# Patient Record
Sex: Male | Born: 1950 | Race: White | Hispanic: No | Marital: Single | Smoking: Never smoker
Health system: Southern US, Community
[De-identification: ages and names within clinical notes are randomized; demographics above are authoritative.]

## PROBLEM LIST (undated history)

## (undated) DIAGNOSIS — E119 Type 2 diabetes mellitus without complications: Secondary | ICD-10-CM

## (undated) DIAGNOSIS — I10 Essential (primary) hypertension: Secondary | ICD-10-CM

## (undated) DIAGNOSIS — Z9289 Personal history of other medical treatment: Secondary | ICD-10-CM

## (undated) DIAGNOSIS — J45909 Unspecified asthma, uncomplicated: Secondary | ICD-10-CM

## (undated) DIAGNOSIS — G629 Polyneuropathy, unspecified: Secondary | ICD-10-CM

## (undated) DIAGNOSIS — H359 Unspecified retinal disorder: Secondary | ICD-10-CM

## (undated) DIAGNOSIS — H538 Other visual disturbances: Secondary | ICD-10-CM

## (undated) DIAGNOSIS — K8020 Calculus of gallbladder without cholecystitis without obstruction: Secondary | ICD-10-CM

## (undated) DIAGNOSIS — K259 Gastric ulcer, unspecified as acute or chronic, without hemorrhage or perforation: Secondary | ICD-10-CM

## (undated) DIAGNOSIS — M726 Necrotizing fasciitis: Secondary | ICD-10-CM

## (undated) DIAGNOSIS — L309 Dermatitis, unspecified: Secondary | ICD-10-CM

## (undated) DIAGNOSIS — R21 Rash and other nonspecific skin eruption: Secondary | ICD-10-CM

## (undated) DIAGNOSIS — D649 Anemia, unspecified: Secondary | ICD-10-CM

## (undated) DIAGNOSIS — R252 Cramp and spasm: Secondary | ICD-10-CM

## (undated) HISTORY — DX: Necrotizing fasciitis: M72.6

## (undated) HISTORY — DX: Dermatitis, unspecified: L30.9

## (undated) HISTORY — DX: Cramp and spasm: R25.2

## (undated) HISTORY — DX: Calculus of gallbladder without cholecystitis without obstruction: K80.20

## (undated) HISTORY — PX: ABOVE KNEE LEG AMPUTATION: SUR20

## (undated) HISTORY — DX: Polyneuropathy, unspecified: G62.9

## (undated) HISTORY — DX: Other visual disturbances: H53.8

## (undated) HISTORY — PX: TOE AMPUTATION: SHX809

## (undated) HISTORY — PX: CATARACT EXTRACTION W/ INTRAOCULAR LENS  IMPLANT, BILATERAL: SHX1307

## (undated) HISTORY — DX: Unspecified retinal disorder: H35.9

## (undated) HISTORY — DX: Rash and other nonspecific skin eruption: R21

---

## 2001-09-18 HISTORY — PX: FOOT FRACTURE SURGERY: SHX645

## 2008-04-06 ENCOUNTER — Encounter: Admission: RE | Admit: 2008-04-06 | Discharge: 2008-06-10 | Payer: Self-pay | Admitting: Endocrinology

## 2008-04-21 ENCOUNTER — Encounter
Admission: RE | Admit: 2008-04-21 | Discharge: 2008-04-22 | Payer: Self-pay | Admitting: Physical Medicine & Rehabilitation

## 2008-04-22 ENCOUNTER — Ambulatory Visit: Payer: Self-pay | Admitting: Physical Medicine & Rehabilitation

## 2009-06-29 ENCOUNTER — Ambulatory Visit: Payer: Self-pay | Admitting: Vascular Surgery

## 2009-09-20 ENCOUNTER — Ambulatory Visit (HOSPITAL_COMMUNITY): Admission: RE | Admit: 2009-09-20 | Discharge: 2009-09-20 | Payer: Self-pay | Admitting: Endocrinology

## 2010-04-06 ENCOUNTER — Encounter: Admission: RE | Admit: 2010-04-06 | Discharge: 2010-04-06 | Payer: Self-pay | Admitting: Orthopedic Surgery

## 2011-01-31 NOTE — Consult Note (Signed)
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 [name] for possible vascular insufficiency of the left  leg.  This patient has a history of an urgent right above knee  amputation in 2006 in [place], [place], when he apparently  became septic and extremely ill and had above knee amputation and a  prolonged hospitalization.  He subsequently moved to [facility] and over  the past few years has been having intermittent pain behind his left  ankle bone.  This has not been associated with any infection,  ulceration, gangrene, drainage or other problems.  He has had a history  of ruptured left flexor tendon in the ankle area which required surgery  as well as a left first toe amputation in the past.  At that point he  had a fracture of his left fifth metatarsal bone.  He has not had this  evaluated by an orthopedic surgeon in [place].  He does not ambulate  much and has not had a successful ability to obtain a right leg  prosthesis.   PAST MEDICAL HISTORY:  1. Insulin diabetes mellitus since age 59.  2. Hypertension.  3. Hyperlipidemia.  4. COPD with asthma.  5. Negative for coronary artery disease or stroke.   FAMILY HISTORY:  Positive for coronary artery disease in his mother and  father, diabetes in his mother.  Negative for stroke.   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  He does not use tobacco or alcohol.   REVIEW OF SYSTEMS:  Negative for weight loss, anorexia, chest pain,  dyspnea on exertion, PND, orthopnea, bronchitis.  Does have intermittent  wheezing from asthma.  No GI or GU symptoms.  Denies any neurologic,  psychiatric or hematologic problems.  Please see health history exam.   ALLERGIES:  None known.   MEDICATIONS:  Please see health history form.   PHYSICAL EXAM:  Vital signs:  Blood pressure is 141/91, heart rate is  89, respirations 14.  General:  He is a middle-aged male patient in no  apparent distress, alert and oriented x3.  Neck:  Supple, 3+ carotid  pulses palpable.  No bruits are audible.  Neurological:  Normal.  No  palpable adenopathy in the neck.  Chest:  Clear to auscultation.  Cardiovascular:  Reveals regular rhythm.  No murmurs.  Abdomen:  Soft,  nontender with no masses.  He has 3+ femoral pulse on the right with a  well-healed above knee amputation.  Left leg has a 3+ femoral, 2+  popliteal pulse with a 1+ posterior tibial pulse.  The left foot is well  perfused with no evidence of infection, ulceration or gangrene.  The  tenderness that he experiences is posterior to the left lateral  malleolus but no abnormalities are noted in this area.  No distal edema  is noted.   Lower extremity arterial Dopplers reveal biphasic and triphasic flow in  the left foot with noncompressible vessels consistent with medial  calcification from diabetes.   He does not have any evidence of severe vascular insufficiency of the  left leg  to account for these symptoms which sound more mechanical and  orthopedic in nature.  Possibly an orthopedic consultation would be in  order since he did have a fracture of his left fifth metatarsal bone  with surgery performed in this area.  No further vascular evaluation is  necessary.   Quita Skye Hart [place], M.D.  Electronically Signed   JDL/MEDQ  D:  06/29/2009  T:  06/30/2009  Job:  [id number]   cc:   Jeannett Senior A. [name], M.D.

## 2011-01-31 NOTE — Group Therapy Note (Signed)
[name] is referred to this office by [name] at [facility].  The  patient is a 60 year old large adult male who underwent a right  transfemoral amputation in May 2008 secondary to severe infection  related to diabetes mellitus.  He reports that after this initial  surgery he had infection and was not fitted for an eventual prosthesis  until, possibly, early 2009, that continues to have fitting problems and  he used the prosthesis intermittently for only approximately 4-6 months.  He really stopped using that prosthesis completely in 2009 secondary to  fit problems.   The patient has been using a wheelchair for all mobility, although he  does do stand-pivot transfers.  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  He is interested in getting a repeat definitive prosthesis at  this point.  He has been seen by [name], and they are evaluating his  old prosthesis.  He has also been started on outpatient therapy with  [name] for strengthening, especially in ankle  dorsiflexion on the left side.  He does have toe amputation from the  left great toe and, of course, the right transfemoral amputation in his  right lower extremity.   The patient reports that he had been using walker for only short-  distance mobility but for the most part uses the wheelchair for all  mobility.  In terms of his other health problems, he has diabetes but  has blood sugars which range from 80 to 110.  He reports that hemoglobin  A1c was recently measured at 6.4 by his primary care physician Dr.  [name].  He reports a weight of 250 pounds and reports that he may some  mild kidney problems at present.   PAST MEDICAL HISTORY:  1. History of diabetic neuropathy.  2. Insulin-dependent diabetes mellitus.  3. Hypertension.  4. Dyslipidemia.  5. Gastroesophageal reflux disease.   MEDICATIONS:  1. NovoLog  sliding-scale insulin b.i.d. p.r.n.  2. Lantus 40 mg nightly.  3. Fortamet 500 mg b.i.d.  4. Crestor 20 mg daily.  5. Lisinopril 5 mg daily.  6. Multivitamin daily.  7. Aspirin 81 mg daily.  8. B1, 250 mg daily.  9. Tums p.r.n.  10.Nexium 40 mg daily.   REVIEW OF SYSTEMS:  Noncontributory.   ALLERGIES:  No known drug allergies.   PHYSICAL EXAMINATION:  GENERAL:  Well-appearing, large adult male.  Height 6 feet, 250 pounds.  Seated in a manual wheelchair.  VITAL SIGNS:  Blood pressure is 152/73 with pulse of 81, respiratory  rate 18, and O2 saturation 98% on room air.  He has 5/5 strength  throughout the bilateral upper extremities and residual bilateral lower  extremities.  Sensation is intact to light touch throughout the  bilateral upper and lower extremities.   IMPRESSION:  1. 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  2. Poor fitting problems related to prior prosthesis.  3. Insulin-dependent diabetes mellitus.  4. Dyslipidemia.  5. Hypertension.  6. Prior left great toe amputation.   At the present time, the patient will continue to see [name] along  with Zella Ball in the [facility].  The patient is interested in  obtaining a prosthesis, but he is interested in looking into the cost  also.  [name] is also looking into the fact that the patient also weighs  250 pounds and the pneumatic knee may or may not  suffice to carry that  weight.  There is a possibility that he may need to switch to a  hydraulic knee but, hopefully, they can still use the foot.  He  definitely needs a new socket for his right above-knee amputation site.  He has changed in volume in that residual limb and also feels that he  has increased muscle tone.  The wound itself is completely healed, but  he does have less tissue medially than he does laterally.  There is no  pain on palpation, and he denies pain throughout the entire right lower  extremity.   We will plan on seeing the patient  in followup on an as-needed basis.  He will be continuing with the prosthetist and the  physical therapist,  and they will hopefully be able to get him a prosthesis that will work  for him and be affordable.  We will plan on seeing the patient in  followup on an as-needed basis.           ______________________________  Ellwood Dense, M.D.     DC/MedQ  D:  04/22/2008 10:23:07  T:  04/23/2008 03:42:49  Job #:  [id number]

## 2011-09-19 HISTORY — PX: EYE SURGERY: SHX253

## 2011-10-17 ENCOUNTER — Ambulatory Visit: Payer: BC Managed Care – PPO | Attending: Endocrinology | Admitting: Physical Therapy

## 2011-10-17 DIAGNOSIS — Z4789 Encounter for other orthopedic aftercare: Secondary | ICD-10-CM | POA: Insufficient documentation

## 2011-10-17 DIAGNOSIS — R269 Unspecified abnormalities of gait and mobility: Secondary | ICD-10-CM | POA: Insufficient documentation

## 2011-10-17 DIAGNOSIS — R5381 Other malaise: Secondary | ICD-10-CM | POA: Insufficient documentation

## 2011-10-24 ENCOUNTER — Ambulatory Visit: Payer: BC Managed Care – PPO | Admitting: Physical Therapy

## 2011-10-24 ENCOUNTER — Encounter: Payer: BC Managed Care – PPO | Admitting: Physical Therapy

## 2011-10-24 ENCOUNTER — Ambulatory Visit: Payer: BC Managed Care – PPO | Attending: Endocrinology | Admitting: Physical Therapy

## 2011-10-24 DIAGNOSIS — R269 Unspecified abnormalities of gait and mobility: Secondary | ICD-10-CM | POA: Insufficient documentation

## 2011-10-24 DIAGNOSIS — R5381 Other malaise: Secondary | ICD-10-CM | POA: Insufficient documentation

## 2011-10-24 DIAGNOSIS — Z4789 Encounter for other orthopedic aftercare: Secondary | ICD-10-CM | POA: Insufficient documentation

## 2011-10-30 ENCOUNTER — Encounter: Payer: Self-pay | Admitting: Physical Therapy

## 2011-10-31 ENCOUNTER — Encounter: Payer: Self-pay | Admitting: Physical Therapy

## 2011-11-07 ENCOUNTER — Encounter: Payer: Self-pay | Admitting: Physical Therapy

## 2011-11-09 ENCOUNTER — Encounter: Payer: BC Managed Care – PPO | Admitting: Physical Therapy

## 2011-11-09 ENCOUNTER — Encounter: Payer: Self-pay | Admitting: Physical Therapy

## 2011-11-14 ENCOUNTER — Encounter: Payer: Self-pay | Admitting: Physical Therapy

## 2011-11-16 ENCOUNTER — Encounter: Payer: Self-pay | Admitting: Physical Therapy

## 2011-11-21 ENCOUNTER — Encounter: Payer: Self-pay | Admitting: Physical Therapy

## 2011-11-23 ENCOUNTER — Encounter: Payer: Self-pay | Admitting: Physical Therapy

## 2011-12-11 ENCOUNTER — Other Ambulatory Visit: Payer: Self-pay | Admitting: Endocrinology

## 2011-12-11 DIAGNOSIS — R1011 Right upper quadrant pain: Secondary | ICD-10-CM

## 2011-12-15 ENCOUNTER — Ambulatory Visit
Admission: RE | Admit: 2011-12-15 | Discharge: 2011-12-15 | Disposition: A | Discharge status: Home / Self Care | Payer: BC Managed Care – PPO | Source: Ambulatory Visit | Attending: Endocrinology | Admitting: Endocrinology

## 2011-12-15 DIAGNOSIS — R1011 Right upper quadrant pain: Secondary | ICD-10-CM

## 2011-12-18 ENCOUNTER — Other Ambulatory Visit (HOSPITAL_COMMUNITY): Payer: Self-pay | Admitting: Endocrinology

## 2011-12-19 ENCOUNTER — Ambulatory Visit (HOSPITAL_COMMUNITY): Payer: BC Managed Care – PPO

## 2011-12-21 ENCOUNTER — Ambulatory Visit (HOSPITAL_COMMUNITY): Payer: BC Managed Care – PPO | Attending: Cardiovascular Disease

## 2011-12-21 ENCOUNTER — Other Ambulatory Visit: Payer: Self-pay

## 2011-12-21 DIAGNOSIS — J45909 Unspecified asthma, uncomplicated: Secondary | ICD-10-CM | POA: Insufficient documentation

## 2011-12-21 DIAGNOSIS — R072 Precordial pain: Secondary | ICD-10-CM | POA: Insufficient documentation

## 2011-12-21 DIAGNOSIS — E119 Type 2 diabetes mellitus without complications: Secondary | ICD-10-CM | POA: Insufficient documentation

## 2011-12-22 ENCOUNTER — Encounter (HOSPITAL_COMMUNITY): Payer: Self-pay | Admitting: Endocrinology

## 2012-02-23 ENCOUNTER — Encounter (INDEPENDENT_AMBULATORY_CARE_PROVIDER_SITE_OTHER): Payer: Self-pay | Admitting: General Surgery

## 2012-02-26 ENCOUNTER — Ambulatory Visit (INDEPENDENT_AMBULATORY_CARE_PROVIDER_SITE_OTHER): Payer: Self-pay | Admitting: General Surgery

## 2012-03-20 ENCOUNTER — Telehealth (INDEPENDENT_AMBULATORY_CARE_PROVIDER_SITE_OTHER): Payer: Self-pay | Admitting: General Surgery

## 2012-03-20 NOTE — Telephone Encounter (Signed)
Left message for Jeffery [name] (medical records) advising that information for this patient has not been received. Dr. Derrell Lolling will need the information before seeing this patient on 03/26/12. Requested information to be sent and if any questions to please call.

## 2012-03-26 ENCOUNTER — Telehealth (INDEPENDENT_AMBULATORY_CARE_PROVIDER_SITE_OTHER): Payer: Self-pay | Admitting: General Surgery

## 2012-03-26 ENCOUNTER — Encounter (INDEPENDENT_AMBULATORY_CARE_PROVIDER_SITE_OTHER): Payer: Self-pay | Admitting: General Surgery

## 2012-03-26 ENCOUNTER — Ambulatory Visit (INDEPENDENT_AMBULATORY_CARE_PROVIDER_SITE_OTHER): Payer: BC Managed Care – PPO | Admitting: General Surgery

## 2012-03-26 NOTE — Telephone Encounter (Signed)
Called back and had to leave a message for [employer] at [facility]. The notes for this patient have still not been received. Advised the notes are needed for the appointment this patient has today with Dr. Derrell Lolling at 4:00. He needs the notes for review before seeing the patient.

## 2012-03-26 NOTE — Telephone Encounter (Signed)
Called both numbers in system for this patient and neither one allow you to leave a message for the patient. Both numbers are for [facility] [employer], not home or cell number.   I contacted Asher Muir at [facility] (Dr. Rinaldo Cloud nurse) and advised the patient did not show for the appointment. Advised that if they contact the patient to advise him to contact us to reschedule. Advised the notes received from their office will be sent to medical records to be scanned. Asher Muir agreed.

## 2012-03-29 ENCOUNTER — Encounter (INDEPENDENT_AMBULATORY_CARE_PROVIDER_SITE_OTHER): Payer: Self-pay | Admitting: General Surgery

## 2012-04-02 ENCOUNTER — Encounter (INDEPENDENT_AMBULATORY_CARE_PROVIDER_SITE_OTHER): Payer: Self-pay

## 2014-11-15 LAB — BASIC METABOLIC PANEL
BUN: 34 mg/dL — AB (ref 4–21)
Creatinine: 1.2 mg/dL (ref 0.6–1.3)
Glucose: 121 mg/dL
Potassium: 3.8 mmol/L (ref 3.4–5.3)
Sodium: 136 mmol/L — AB (ref 137–147)

## 2014-11-15 LAB — CBC AND DIFFERENTIAL
HCT: 30 % — AB (ref 41–53)
Hemoglobin: 9.6 g/dL — AB (ref 13.5–17.5)
PLATELETS: 55 10*3/uL — AB (ref 150–399)
WBC: 4.1 10*3/mL

## 2014-11-15 LAB — HEPATIC FUNCTION PANEL
ALT: 14 U/L (ref 10–40)
AST: 44 U/L — AB (ref 14–40)
Alkaline Phosphatase: 77 U/L (ref 25–125)
Bilirubin, Total: 3 mg/dL

## 2015-11-02 ENCOUNTER — Encounter: Payer: Self-pay | Admitting: Endocrinology

## 2015-11-17 LAB — BASIC METABOLIC PANEL
BUN: 32 mg/dL — AB (ref 4–21)
Creatinine: 1.2 mg/dL (ref 0.6–1.3)
Glucose: 136 mg/dL
Potassium: 4.2 mmol/L (ref 3.4–5.3)
SODIUM: 134 mmol/L — AB (ref 137–147)

## 2015-12-15 ENCOUNTER — Other Ambulatory Visit (HOSPITAL_COMMUNITY): Payer: Self-pay | Admitting: Endocrinology

## 2015-12-15 ENCOUNTER — Ambulatory Visit (HOSPITAL_COMMUNITY)
Admission: RE | Admit: 2015-12-15 | Discharge: 2015-12-15 | Disposition: A | Discharge status: Home / Self Care | Payer: BC Managed Care – PPO | Source: Ambulatory Visit | Attending: Vascular Surgery | Admitting: Vascular Surgery

## 2015-12-15 DIAGNOSIS — E1140 Type 2 diabetes mellitus with diabetic neuropathy, unspecified: Secondary | ICD-10-CM | POA: Diagnosis not present

## 2015-12-15 DIAGNOSIS — L97929 Non-pressure chronic ulcer of unspecified part of left lower leg with unspecified severity: Secondary | ICD-10-CM

## 2015-12-15 DIAGNOSIS — L97911 Non-pressure chronic ulcer of unspecified part of right lower leg limited to breakdown of skin: Secondary | ICD-10-CM

## 2016-03-17 ENCOUNTER — Telehealth: Payer: Self-pay | Admitting: Hematology and Oncology

## 2016-03-17 ENCOUNTER — Encounter: Payer: Self-pay | Admitting: Hematology and Oncology

## 2016-03-17 NOTE — Telephone Encounter (Signed)
Patient returned my call to schedule an appointment with Bertis RuddyGorsuch on 7/3 at 1pm. Directions were given. Demographics verified. Letter to the referring.

## 2016-03-20 ENCOUNTER — Other Ambulatory Visit (HOSPITAL_BASED_OUTPATIENT_CLINIC_OR_DEPARTMENT_OTHER): Payer: BC Managed Care – PPO

## 2016-03-20 ENCOUNTER — Encounter: Payer: Self-pay | Admitting: Hematology and Oncology

## 2016-03-20 ENCOUNTER — Telehealth: Payer: Self-pay | Admitting: Hematology and Oncology

## 2016-03-20 ENCOUNTER — Ambulatory Visit (HOSPITAL_BASED_OUTPATIENT_CLINIC_OR_DEPARTMENT_OTHER): Payer: BC Managed Care – PPO | Admitting: Hematology and Oncology

## 2016-03-20 VITALS — BP 129/68 | HR 97 | Temp 98.2°F | Resp 20

## 2016-03-20 DIAGNOSIS — D61818 Other pancytopenia: Secondary | ICD-10-CM | POA: Insufficient documentation

## 2016-03-20 DIAGNOSIS — L409 Psoriasis, unspecified: Secondary | ICD-10-CM | POA: Diagnosis not present

## 2016-03-20 DIAGNOSIS — K920 Hematemesis: Secondary | ICD-10-CM | POA: Diagnosis not present

## 2016-03-20 LAB — CBC & DIFF AND RETIC
BASO%: 0.5 % (ref 0.0–2.0)
Basophils Absolute: 0 10*3/uL (ref 0.0–0.1)
EOS ABS: 0.5 10*3/uL (ref 0.0–0.5)
EOS%: 10.5 % — ABNORMAL HIGH (ref 0.0–7.0)
HCT: 25.5 % — ABNORMAL LOW (ref 38.4–49.9)
HGB: 8.5 g/dL — ABNORMAL LOW (ref 13.0–17.1)
IMMATURE RETIC FRACT: 12.7 % — AB (ref 3.00–10.60)
LYMPH%: 26.5 % (ref 14.0–49.0)
MCH: 29 pg (ref 27.2–33.4)
MCHC: 33.3 g/dL (ref 32.0–36.0)
MCV: 87 fL (ref 79.3–98.0)
MONO#: 0.4 10*3/uL (ref 0.1–0.9)
MONO%: 9.6 % (ref 0.0–14.0)
NEUT%: 52.9 % (ref 39.0–75.0)
NEUTROS ABS: 2.3 10*3/uL (ref 1.5–6.5)
NRBC: 0 % (ref 0–0)
PLATELETS: 81 10*3/uL — AB (ref 140–400)
RBC: 2.93 10*6/uL — AB (ref 4.20–5.82)
RDW: 17.2 % — AB (ref 11.0–14.6)
Retic %: 1.32 % (ref 0.80–1.80)
Retic Ct Abs: 38.68 10*3/uL (ref 34.80–93.90)
WBC: 4.4 10*3/uL (ref 4.0–10.3)
lymph#: 1.2 10*3/uL (ref 0.9–3.3)

## 2016-03-20 LAB — PROTIME-INR
INR: 1.3 — ABNORMAL LOW (ref 2.00–3.50)
Protime: 15.6 Seconds — ABNORMAL HIGH (ref 10.6–13.4)

## 2016-03-20 LAB — CHCC SMEAR

## 2016-03-20 NOTE — Progress Notes (Signed)
[facility] CONSULT NOTE  Patient Care Team: Reynold Bowen, MD as PCP - General (Endocrinology)  CHIEF COMPLAINTS/PURPOSE OF CONSULTATION:  Pancytopenia, recent hematemesis  HISTORY OF PRESENTING ILLNESS:  Jeffery [name] 65 y.o. male is here because of pancytopenia. The patient was nearly 1 hour late for his appointment. The patient presented with acute onset of hematemesis and nausea approximately 2-1/2 weeks ago. He complained of passage of dark emesis with diarrhea of black fluid since. He denies recent abdominal pain. He has sensation of bloating after eating. He has passage of loose stool with mucus recently. He has mildly reduced appetite but denies weight loss. I have the opportunity to review his blood work. According to outside records, he had blood work done last month which show white count of 4.1, hemoglobin of 9 and platelet count 77,000. Subsequent repeat blood work showed total blood cell count of 4.4, hemoglobin of 8.9 and platelet count of 92,000. Total bilirubin was noted to be elevated at 2.3. He was subsequently refer here for further management.  He denies recent chest pain on exertion, shortness of breath on minimal exertion, pre-syncopal episodes, or palpitations. He had not noticed any recent other bleeding such as epistaxis or hematuria The patient denies over the counter NSAID ingestion. He is on antiplatelets agents.  He had no prior history or diagnosis of cancer. His age appropriate screening programs are up-to-date. He denies any pica and eats a variety of diet. He never donated blood or received blood transfusion He was never diagnosed with liver disease. He does have chronic rash resembling psoriasis but he was never told he had psoriasis. The patient has right below knee amputation many years ago due to poorly controlled diabetes and peripheral vascular disease. However, ever since then, he has not taken any significant medication for  diabetes  MEDICAL HISTORY:  Past Medical History  Diagnosis Date  . Asthma   . Neuropathy ([place])   . Muscle cramps   . Arthritis   . Rash   . Diabetes mellitus     type II  . Bilateral cataracts   . Necrotizing fasciitis ([place])   . Blurred vision   . Retinal degeneration   . Cholelithiasis   . Bilateral cataracts   . Eczema     inner wrist    SURGICAL HISTORY: Past Surgical History  Procedure Laterality Date  . Toe amputation      Left big toe  . Above knee leg amputation      right  . Eye surgery  09/2011    bilateral for retinopathy - laser eye surgery  . Left metatarsal  2003    5th  . Right metacarpal  2008    5th    SOCIAL HISTORY: Social History   Social History  . Marital Status: Married    Spouse Name: N/A  . Number of Children: N/A  . Years of Education: N/A   Occupational History  . Not on file.   Social History Main Topics  . Smoking status: Never Smoker   . Smokeless tobacco: Never Used  . Alcohol Use: No  . Drug Use: No  . Sexual Activity: Not on file     Comment: computer work. next of kin. Sister Gwenette Greet next of kin   Other Topics Concern  . Not on file   Social History Narrative    FAMILY HISTORY: Family History  Problem Relation Age of Onset  . Cancer Mother     ALLERGIES:  has no  allergies on file.  MEDICATIONS:  Current Outpatient Prescriptions  Medication Sig Dispense Refill  . aspirin 81 MG tablet Take 81 mg by mouth daily.    Marland Kitchen atorvastatin (LIPITOR) 40 MG tablet Take 40 mg by mouth daily.  2  . brimonidine (ALPHAGAN P) 0.1 % SOLN 1 drop 2 (two) times daily.    . clotrimazole-betamethasone (LOTRISONE) cream Apply topically 2 (two) times daily.    Marland Kitchen losartan (COZAAR) 50 MG tablet Take 50 mg by mouth daily.    Marland Kitchen ketoconazole (NIZORAL) 2 % cream Apply topically daily.     No current facility-administered medications for this visit.    REVIEW OF SYSTEMS:   Constitutional: Denies fevers, chills or abnormal night  sweats Eyes: Denies blurriness of vision, double vision or watery eyes Ears, nose, mouth, throat, and face: Denies mucositis or sore throat Respiratory: Denies cough, dyspnea or wheezes Cardiovascular: Denies palpitation, chest discomfort or lower extremity swelling Lymphatics: Denies new lymphadenopathy or easy bruising Neurological:Denies numbness, tingling or new weaknesses Behavioral/Psych: Mood is stable, no new changes  All other systems were reviewed with the patient and are negative.  PHYSICAL EXAMINATION: ECOG PERFORMANCE STATUS: 1 - Symptomatic but completely ambulatory  Filed Vitals:   03/20/16 1503  BP: 129/68  Pulse: 97  Temp: 98.2 F (36.8 C)  Resp: 20   Filed Weights    GENERAL:alert, no distress and comfortable. He is mildly obese, sitting on a wheelchair SKIN: No skin rash consistent with psoriasis. No petechiae EYES: normal, conjunctiva are pink and non-injected, sclera clear OROPHARYNX:no exudate, no erythema and lips, buccal mucosa, and tongue normal  NECK: supple, thyroid normal size, non-tender, without nodularity LYMPH:  no palpable lymphadenopathy in the cervical, axillary or inguinal LUNGS: clear to auscultation and percussion with normal breathing effort HEART: regular rate & rhythm and no murmurs and no lower extremity edema ABDOMEN:abdomen soft, non-tender and normal bowel sounds Musculoskeletal:no cyanosis of digits and no clubbing  PSYCH: alert & oriented x 3 with fluent speech NEURO: no focal motor/sensory deficits  ASSESSMENT & PLAN:  Pancytopenia, acquired ([place]) The cause is unknown. Further workup in progress. I will see him back at the end of the week to review test results. He may need a bone marrow biopsy for further evaluation  Generalized psoriasis The patient is noted to have psoriasis. He could have autoimmune phenomenon as a cause of his pancytopenia He is asymptomatic from the psoriasis standpoint and would not need further  treatment  Rosanna Randy syndrome He was noted to have elevated total bilirubin. Cause is unknown. Further workup in progress  Hematemesis This is a new onset. He may benefit from upper GI evaluation in the future. Depending on workup, he may need imaging study to exclude liver cirrhosis or esophageal varices as a cause of recent hematemesis   Orders Placed This Encounter  Procedures  . CBC & Diff and Retic    Standing Status: Future     Number of Occurrences: 1     Standing Expiration Date: 04/24/2017  . Ferritin    Standing Status: Future     Number of Occurrences: 1     Standing Expiration Date: 04/24/2017  . Vitamin B12    Standing Status: Future     Number of Occurrences: 1     Standing Expiration Date: 04/24/2017  . Sedimentation rate    Standing Status: Future     Number of Occurrences: 1     Standing Expiration Date: 04/24/2017  . Smear  Standing Status: Future     Number of Occurrences: 1     Standing Expiration Date: 04/24/2017  . Iron and TIBC    Standing Status: Future     Number of Occurrences: 1     Standing Expiration Date: 04/24/2017  . Erythropoietin    Standing Status: Future     Number of Occurrences: 1     Standing Expiration Date: 04/24/2017  . Haptoglobin    Standing Status: Future     Number of Occurrences: 1     Standing Expiration Date: 04/24/2017  . ANA  . Bilirubin, fractionated(tot/dir/indir)    Standing Status: Future     Number of Occurrences: 1     Standing Expiration Date: 04/24/2017  . Protime-INR    Standing Status: Future     Number of Occurrences: 1     Standing Expiration Date: 04/24/2017  . APTT    Standing Status: Future     Number of Occurrences: 1     Standing Expiration Date: 04/24/2017  . Direct antiglobulin test (not at [facility])    Standing Status: Future     Number of Occurrences: 1     Standing Expiration Date: 04/24/2017      All questions were answered. The patient knows to call the clinic with any problems, questions or  concerns. I spent 40 minutes counseling the patient face to face. The total time spent in the appointment was 55 minutes and more than 50% was on counseling.     [facility], [place], MD 03/20/2016 4:48 PM

## 2016-03-20 NOTE — Telephone Encounter (Signed)
Gave pt avs °

## 2016-03-21 LAB — BILIRUBIN, FRACTIONATED(TOT/DIR/INDIR)
BILIRUBIN INDIRECT: 0.88 mg/dL — AB (ref 0.10–0.80)
BILIRUBIN TOTAL: 1.3 mg/dL — AB (ref 0.0–1.2)
BILIRUBIN, DIRECT: 0.42 mg/dL — AB (ref 0.00–0.40)

## 2016-03-21 LAB — ERYTHROPOIETIN: Erythropoietin: 147 m[IU]/mL — ABNORMAL HIGH (ref 2.6–18.5)

## 2016-03-21 LAB — VITAMIN B12: Vitamin B12: 1286 pg/mL — ABNORMAL HIGH (ref 211–946)

## 2016-03-21 LAB — SEDIMENTATION RATE: SED RATE: 36 mm/h — AB (ref 0–30)

## 2016-03-21 LAB — APTT: APTT: 33 s (ref 24–33)

## 2016-03-21 LAB — HAPTOGLOBIN

## 2016-03-22 ENCOUNTER — Other Ambulatory Visit: Payer: Self-pay | Admitting: Hematology and Oncology

## 2016-03-22 LAB — IRON AND TIBC
%SAT: 9 % — ABNORMAL LOW (ref 20–55)
IRON: 27 ug/dL — AB (ref 42–163)
TIBC: 289 ug/dL (ref 202–409)
UIBC: 262 ug/dL (ref 117–376)

## 2016-03-22 LAB — FERRITIN: Ferritin: 27 ng/ml (ref 22–316)

## 2016-03-22 LAB — DIRECT ANTIGLOBULIN TEST (NOT AT ARMC): COOMBS', DIRECT: NEGATIVE

## 2016-03-22 NOTE — Assessment & Plan Note (Signed)
The patient is noted to have psoriasis. He could have autoimmune phenomenon as a cause of his pancytopenia He is asymptomatic from the psoriasis standpoint and would not need further treatment

## 2016-03-22 NOTE — Assessment & Plan Note (Signed)
This is a new onset. He may benefit from upper GI evaluation in the future. Depending on workup, he may need imaging study to exclude liver cirrhosis or esophageal varices as a cause of recent hematemesis

## 2016-03-22 NOTE — Assessment & Plan Note (Signed)
The cause is unknown. Further workup in progress. I will see him back at the end of the week to review test results. He may need a bone marrow biopsy for further evaluation

## 2016-03-22 NOTE — Assessment & Plan Note (Signed)
He was noted to have elevated total bilirubin. Cause is unknown. Further workup in progress

## 2016-03-24 ENCOUNTER — Telehealth: Payer: Self-pay | Admitting: Hematology and Oncology

## 2016-03-24 ENCOUNTER — Encounter: Payer: Self-pay | Admitting: Hematology and Oncology

## 2016-03-24 ENCOUNTER — Ambulatory Visit (HOSPITAL_BASED_OUTPATIENT_CLINIC_OR_DEPARTMENT_OTHER): Payer: BC Managed Care – PPO | Admitting: Hematology and Oncology

## 2016-03-24 VITALS — BP 136/57 | HR 70 | Temp 97.5°F | Resp 17 | Ht 73.0 in | Wt 238.4 lb

## 2016-03-24 DIAGNOSIS — D589 Hereditary hemolytic anemia, unspecified: Secondary | ICD-10-CM | POA: Insufficient documentation

## 2016-03-24 DIAGNOSIS — D509 Iron deficiency anemia, unspecified: Secondary | ICD-10-CM | POA: Diagnosis not present

## 2016-03-24 DIAGNOSIS — D696 Thrombocytopenia, unspecified: Secondary | ICD-10-CM

## 2016-03-24 DIAGNOSIS — D61818 Other pancytopenia: Secondary | ICD-10-CM | POA: Diagnosis not present

## 2016-03-24 DIAGNOSIS — D594 Other nonautoimmune hemolytic anemias: Secondary | ICD-10-CM | POA: Diagnosis not present

## 2016-03-24 DIAGNOSIS — D550 Anemia due to glucose-6-phosphate dehydrogenase [G6PD] deficiency: Secondary | ICD-10-CM

## 2016-03-24 NOTE — Telephone Encounter (Signed)
per pof to sch pt appt-gave pt copy of avs °

## 2016-03-24 NOTE — Progress Notes (Signed)
[facility] OFFICE PROGRESS NOTE  [name],[name], MD SUMMARY OF HEMATOLOGIC HISTORY:  Pancytopenia, recent hematemesis  HISTORY OF PRESENTING ILLNESS:  Jeffery [name] 65 y.o. male is here because of pancytopenia. The patient was nearly 1 hour late for his appointment. The patient presented with acute onset of hematemesis and nausea approximately 2-1/2 weeks ago. He complained of passage of dark emesis with diarrhea of black fluid since. He denies recent abdominal pain. He has sensation of bloating after eating. He has passage of loose stool with mucus recently. He has mildly reduced appetite but denies weight loss. I have the opportunity to review his blood work. According to outside records, he had blood work done last month which show white count of 4.1, hemoglobin of 9 and platelet count 77,000. Subsequent repeat blood work showed total blood cell count of 4.4, hemoglobin of 8.9 and platelet count of 92,000. Total bilirubin was noted to be elevated at 2.3. He was subsequently referred here for further management. INTERVAL HISTORY: Jeffery [name] 65 y.o. male returns for further follow-up. He feels well. The patient denies any recent signs or symptoms of bleeding such as spontaneous epistaxis, hematuria or hematochezia. He denies dizziness, chest pain or shortness of breath  I have reviewed the past medical history, past surgical history, social history and family history with the patient and they are unchanged from previous note.  ALLERGIES:  has no allergies on file.  MEDICATIONS:  Current Outpatient Prescriptions  Medication Sig Dispense Refill  . aspirin 81 MG tablet Take 81 mg by mouth daily.    Marland Kitchen. atorvastatin (LIPITOR) 40 MG tablet Take 40 mg by mouth daily.  2  . brimonidine (ALPHAGAN P) 0.1 % SOLN 1 drop 2 (two) times daily.    . clotrimazole-betamethasone (LOTRISONE) cream Apply topically 2 (two) times daily.    Marland Kitchen. ketoconazole (NIZORAL) 2 % cream  Apply topically daily.    Marland Kitchen. losartan (COZAAR) 50 MG tablet Take 50 mg by mouth daily.     No current facility-administered medications for this visit.     REVIEW OF SYSTEMS:   Constitutional: Denies fevers, chills or night sweats Eyes: Denies blurriness of vision Ears, nose, mouth, throat, and face: Denies mucositis or sore throat Respiratory: Denies cough, dyspnea or wheezes Cardiovascular: Denies palpitation, chest discomfort or lower extremity swelling Gastrointestinal:  Denies nausea, heartburn or change in bowel habits Skin: Denies abnormal skin rashes Lymphatics: Denies new lymphadenopathy or easy bruising Neurological:Denies numbness, tingling or new weaknesses Behavioral/Psych: Mood is stable, no new changes  All other systems were reviewed with the patient and are negative.  PHYSICAL EXAMINATION: ECOG PERFORMANCE STATUS: 0 - Asymptomatic  Filed Vitals:   03/24/16 1506  BP: 136/57  Pulse: 70  Temp: 97.5 F (36.4 C)  Resp: 17   Filed Weights   03/24/16 1506  Weight: 238 lb 6.4 oz (108.138 kg)    GENERAL:alert, no distress and comfortable SKIN: skin color, texture, turgor are normal, no rashes or significant lesions EYES: normal, Conjunctiva are pink and non-injected, sclera clear Musculoskeletal:no cyanosis of digits and no clubbing  NEURO: alert & oriented x 3 with fluent speech, no focal motor/sensory deficits  LABORATORY DATA:  I have reviewed the data as listed     Component Value Date/Time   BILITOT 1.3* 03/20/2016 1551    No results found for: SPEP, UPEP  Lab Results  Component Value Date   WBC 4.4 03/20/2016   NEUTROABS 2.3 03/20/2016   HGB 8.5* 03/20/2016   HCT  25.5* 03/20/2016   MCV 87.0 03/20/2016   PLT 81* 03/20/2016      Chemistry   No results found for: NA, K, CL, CO2, BUN, CREATININE, GLU    Component Value Date/Time   BILITOT 1.3* 03/20/2016 1551      ASSESSMENT & PLAN:  Pancytopenia, acquired (HCC) There is particular. He  has anemia and thrombocytopenia. Anemia is new since 4 years ago. In terms of workup for anemia, he is found to be iron deficient.  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 Apparently, he has elevated bilirubin even 4 years ago. That raises suspicion whether this could be related to G6PD deficiency anemia versus paroxsymal nocturnal hemoglobinuria. I recommend iron replacement therapy for one month and recheck his blood work along with G6PD level and PNH screen. I'll see him back next month for further evaluation. At this point in time, he is not symptomatic from anemia and would not require any form of blood transfusion.  We also discussed the possibility of bone marrow aspirate and biopsy but I am hopeful with iron replacement therapy, we can correct his anemia. However, in view of recent hemoptysis/hematemesis, he may still need abdominal imaging study and possible GI evaluation. I will see him back a month to review test results again  Thrombocytopenia [facility](HCC) He had recent thrombocytopenia which is progressive. I suspect this is due to consumption. It is possible he may have undiagnosed liver disease or autoimmune thrombocytopenia, related to psoriasis. He would need abdominal imaging study in the future. At present time, I will proceed to replace iron first and consider imaging study in the future   All questions were answered. The patient knows to call the clinic with any problems, questions or concerns. No barriers to learning was detected.  I spent 15 minutes counseling the patient face to face. The total time spent in the appointment was 20 minutes and more than 50% was on counseling.     [facility] Institute LLCGORSUCH, Maecie Sevcik, MD 7/7/20174:11 PM

## 2016-03-24 NOTE — Assessment & Plan Note (Signed)
He had recent thrombocytopenia which is progressive. I suspect this is due to consumption. It is possible he may have undiagnosed liver disease or autoimmune thrombocytopenia, related to psoriasis. He would need abdominal imaging study in the future. At present time, I will proceed to replace iron first and consider imaging study in the future

## 2016-03-24 NOTE — Assessment & Plan Note (Signed)
There is particular. He has anemia and thrombocytopenia. Anemia is new since 4 years ago. In terms of workup for anemia, he is found to be iron deficient.  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 Apparently, he has elevated bilirubin even 4 years ago. That raises suspicion whether this could be related to G6PD deficiency anemia versus paroxsymal nocturnal hemoglobinuria. I recommend iron replacement therapy for one month and recheck his blood work along with G6PD level and PNH screen. I'll see him back next month for further evaluation. At this point in time, he is not symptomatic from anemia and would not require any form of blood transfusion.  We also discussed the possibility of bone marrow aspirate and biopsy but I am hopeful with iron replacement therapy, we can correct his anemia. However, in view of recent hemoptysis/hematemesis, he may still need abdominal imaging study and possible GI evaluation. I will see him back a month to review test results again

## 2016-03-27 NOTE — Progress Notes (Signed)
I reviewed his peripheral blood smear. I confirmed absolute thrombocytopenia without platelet clumping. There were no evidence of schistocyte Occasional teardrop cells were noted White blood cell is of normal morphology Overall, the peripheral blood smear is nonspecific

## 2016-04-21 ENCOUNTER — Other Ambulatory Visit (HOSPITAL_BASED_OUTPATIENT_CLINIC_OR_DEPARTMENT_OTHER): Payer: BC Managed Care – PPO

## 2016-04-21 DIAGNOSIS — D509 Iron deficiency anemia, unspecified: Secondary | ICD-10-CM | POA: Diagnosis not present

## 2016-04-21 DIAGNOSIS — D594 Other nonautoimmune hemolytic anemias: Secondary | ICD-10-CM

## 2016-04-21 DIAGNOSIS — D61818 Other pancytopenia: Secondary | ICD-10-CM

## 2016-04-21 DIAGNOSIS — D550 Anemia due to glucose-6-phosphate dehydrogenase [G6PD] deficiency: Secondary | ICD-10-CM

## 2016-04-21 LAB — CBC & DIFF AND RETIC
BASO%: 0.5 % (ref 0.0–2.0)
BASOS ABS: 0 10*3/uL (ref 0.0–0.1)
EOS%: 12.2 % — AB (ref 0.0–7.0)
Eosinophils Absolute: 0.5 10*3/uL (ref 0.0–0.5)
HEMATOCRIT: 33.5 % — AB (ref 38.4–49.9)
HGB: 11.1 g/dL — ABNORMAL LOW (ref 13.0–17.1)
Immature Retic Fract: 7.9 % (ref 3.00–10.60)
LYMPH#: 1.3 10*3/uL (ref 0.9–3.3)
LYMPH%: 31.2 % (ref 14.0–49.0)
MCH: 30.4 pg (ref 27.2–33.4)
MCHC: 33.1 g/dL (ref 32.0–36.0)
MCV: 91.8 fL (ref 79.3–98.0)
MONO#: 0.2 10*3/uL (ref 0.1–0.9)
MONO%: 5.4 % (ref 0.0–14.0)
NEUT#: 2.2 10*3/uL (ref 1.5–6.5)
NEUT%: 50.7 % (ref 39.0–75.0)
Platelets: 68 10*3/uL — ABNORMAL LOW (ref 140–400)
RBC: 3.65 10*6/uL — AB (ref 4.20–5.82)
RDW: 22.8 % — ABNORMAL HIGH (ref 11.0–14.6)
RETIC CT ABS: 84.32 10*3/uL (ref 34.80–93.90)
Retic %: 2.31 % — ABNORMAL HIGH (ref 0.80–1.80)
WBC: 4.3 10*3/uL (ref 4.0–10.3)

## 2016-04-21 LAB — COMPREHENSIVE METABOLIC PANEL
ALT: 32 U/L (ref 0–55)
AST: 38 U/L — AB (ref 5–34)
Albumin: 2.6 g/dL — ABNORMAL LOW (ref 3.5–5.0)
Alkaline Phosphatase: 122 U/L (ref 40–150)
Anion Gap: 9 mEq/L (ref 3–11)
BILIRUBIN TOTAL: 1.77 mg/dL — AB (ref 0.20–1.20)
BUN: 15.7 mg/dL (ref 7.0–26.0)
CHLORIDE: 110 meq/L — AB (ref 98–109)
CO2: 18 meq/L — AB (ref 22–29)
CREATININE: 1.3 mg/dL (ref 0.7–1.3)
Calcium: 8.8 mg/dL (ref 8.4–10.4)
EGFR: 59 mL/min/{1.73_m2} — ABNORMAL LOW (ref >90)
GLUCOSE: 128 mg/dL (ref 70–140)
Potassium: 3.6 mEq/L (ref 3.5–5.1)
SODIUM: 138 meq/L (ref 136–145)
TOTAL PROTEIN: 6.9 g/dL (ref 6.4–8.3)

## 2016-04-21 LAB — LACTATE DEHYDROGENASE: LDH: 278 U/L — ABNORMAL HIGH (ref 125–245)

## 2016-04-22 LAB — PROTHROMBIN TIME (PT)
INR: 1.4 — AB (ref 0.8–1.2)
PROTHROMBIN TIME: 14.7 s — AB (ref 9.1–12.0)

## 2016-04-24 LAB — IRON AND TIBC
%SAT: 22 % (ref 20–55)
Iron: 58 ug/dL (ref 42–163)
TIBC: 261 ug/dL (ref 202–409)
UIBC: 204 ug/dL (ref 117–376)

## 2016-04-24 LAB — FERRITIN: FERRITIN: 78 ng/mL (ref 22–316)

## 2016-04-24 LAB — GLUCOSE 6 PHOSPHATE DEHYDROGENASE
G-6-PD, Quant: 10.3 U/g{Hb} (ref 4.6–13.5)
Hemoglobin: 11.2 g/dL — ABNORMAL LOW (ref 12.6–17.7)

## 2016-04-28 ENCOUNTER — Encounter: Payer: Self-pay | Admitting: Hematology and Oncology

## 2016-04-28 ENCOUNTER — Telehealth: Payer: Self-pay | Admitting: Hematology and Oncology

## 2016-04-28 ENCOUNTER — Ambulatory Visit (HOSPITAL_BASED_OUTPATIENT_CLINIC_OR_DEPARTMENT_OTHER): Payer: BC Managed Care – PPO | Admitting: Hematology and Oncology

## 2016-04-28 VITALS — BP 116/86 | HR 65 | Temp 97.7°F | Resp 19

## 2016-04-28 DIAGNOSIS — D696 Thrombocytopenia, unspecified: Secondary | ICD-10-CM | POA: Diagnosis not present

## 2016-04-28 DIAGNOSIS — D61818 Other pancytopenia: Secondary | ICD-10-CM | POA: Diagnosis not present

## 2016-04-28 DIAGNOSIS — D689 Coagulation defect, unspecified: Secondary | ICD-10-CM

## 2016-04-28 NOTE — Progress Notes (Signed)
[facility] OFFICE PROGRESS NOTE  Julian Hy, MD SUMMARY OF HEMATOLOGIC HISTORY:  Jeffery [name] 65 y.o. male is here because of pancytopenia. The patient was nearly 1 hour late for his appointment. The patient presented with acute onset of hematemesis and nausea approximately 2-1/2 weeks ago. He complained of passage of dark emesis with diarrhea of black fluid since. He denies recent abdominal pain. He has sensation of bloating after eating. He has passage of loose stool with mucus recently. He has mildly reduced appetite but denies weight loss. I have the opportunity to review his blood work. According to outside records, he had blood work done last month which show white count of 4.1, hemoglobin of 9 and platelet count 77,000. Subsequent repeat blood work showed total blood cell count of 4.4, hemoglobin of 8.9 and platelet count of 92,000. Total bilirubin was noted to be elevated at 2.3.  INTERVAL HISTORY: Jeffery [name] 65 y.o. male returns for further follow-up. He is compliant taking oral iron supplement daily without any side effects. The patient denies any recent signs or symptoms of bleeding such as spontaneous epistaxis, hematuria or hematochezia. He denies other new symptoms. He had recent ultrasound evaluation which show mild splenomegaly and ascites  I have reviewed the past medical history, past surgical history, social history and family history with the patient and they are unchanged from previous note.  ALLERGIES:  has no allergies on file.  MEDICATIONS:  Current Outpatient Prescriptions  Medication Sig Dispense Refill  . aspirin 81 MG tablet Take 81 mg by mouth daily.    Marland Kitchen atorvastatin (LIPITOR) 40 MG tablet Take 40 mg by mouth daily.  2  . brimonidine (ALPHAGAN P) 0.1 % SOLN 1 drop 2 (two) times daily.    . clotrimazole-betamethasone (LOTRISONE) cream Apply topically 2 (two) times daily.    Marland Kitchen ketoconazole (NIZORAL) 2 % cream Apply  topically daily.    Marland Kitchen losartan (COZAAR) 50 MG tablet Take 50 mg by mouth daily.     No current facility-administered medications for this visit.      REVIEW OF SYSTEMS:   Constitutional: Denies fevers, chills or night sweats Eyes: Denies blurriness of vision Ears, nose, mouth, throat, and face: Denies mucositis or sore throat Respiratory: Denies cough, dyspnea or wheezes Cardiovascular: Denies palpitation, chest discomfort or lower extremity swelling Gastrointestinal:  Denies nausea, heartburn or change in bowel habits Skin: Denies abnormal skin rashes Lymphatics: Denies new lymphadenopathy or easy bruising Neurological:Denies numbness, tingling or new weaknesses Behavioral/Psych: Mood is stable, no new changes  All other systems were reviewed with the patient and are negative.  PHYSICAL EXAMINATION: ECOG PERFORMANCE STATUS: 2 - Symptomatic, <50% confined to bed  Vitals:   04/28/16 1307  BP: 116/86  Pulse: 65  Resp: 19  Temp: 97.7 F (36.5 C)   Filed Weights    GENERAL:alert, no distress and comfortable SKIN: skin color, texture, turgor are normal, no rashes or significant lesions Musculoskeletal:no cyanosis of digits and no clubbing  NEURO: alert & oriented x 3 with fluent speech, no focal motor/sensory deficits  LABORATORY DATA:  I have reviewed the data as listed     Component Value Date/Time   NA 138 04/21/2016 1458   K 3.6 04/21/2016 1458   CO2 18 (L) 04/21/2016 1458   GLUCOSE 128 04/21/2016 1458   BUN 15.7 04/21/2016 1458   CREATININE 1.3 04/21/2016 1458   CALCIUM 8.8 04/21/2016 1458   PROT 6.9 04/21/2016 1458   ALBUMIN 2.6 (L) 04/21/2016 1458  AST 38 (H) 04/21/2016 1458   ALT 32 04/21/2016 1458   ALKPHOS 122 04/21/2016 1458   BILITOT 1.77 (H) 04/21/2016 1458    No results found for: SPEP, UPEP  Lab Results  Component Value Date   WBC 4.3 04/21/2016   NEUTROABS 2.2 04/21/2016   HGB 11.1 (L) 04/21/2016   HCT 33.5 (L) 04/21/2016   MCV 91.8  04/21/2016   PLT 68 (L) 04/21/2016      Chemistry      Component Value Date/Time   NA 138 04/21/2016 1458   K 3.6 04/21/2016 1458   CO2 18 (L) 04/21/2016 1458   BUN 15.7 04/21/2016 1458   CREATININE 1.3 04/21/2016 1458      Component Value Date/Time   CALCIUM 8.8 04/21/2016 1458   ALKPHOS 122 04/21/2016 1458   AST 38 (H) 04/21/2016 1458   ALT 32 04/21/2016 1458   BILITOT 1.77 (H) 04/21/2016 1458     ASSESSMENT & PLAN:  Pancytopenia, acquired (HCC) The anemia is not consistent with hemolytic anemia, rather consistent with iron deficiency from recent bleeding. He has improved dramatically with oral iron supplements. I recommend he continues taking that daily until I see him back in 6 months  Thrombocytopenia (HCC) The thrombocytopenia is likely related to liver disease and splenomegaly. He is not symptomatic. There is no contraindication to remain on aspirin as long as the platelet is greater than 50,000.    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 I am wondering whether his recent episodes of hematemesis is related to possible esophageal varices. His primary care doctor is referring him to GI service for further evaluation   All questions were answered. The patient knows to call the clinic with any problems, questions or concerns. No barriers to learning was detected.  I spent 15 minutes counseling the patient face to face. The total time spent in the appointment was 20 minutes and more than 50% was on counseling.     [facility], Kalven Ganim, MD 8/11/20173:48 PM

## 2016-04-28 NOTE — Telephone Encounter (Signed)
per pof to sch pt appt-gave pt copy of avs °

## 2016-04-28 NOTE — Assessment & Plan Note (Signed)
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 I am wondering whether his recent episodes of hematemesis is related to possible esophageal varices. His primary care doctor is referring him to GI service for further evaluation

## 2016-04-28 NOTE — Assessment & Plan Note (Signed)
The thrombocytopenia is likely related to liver disease and splenomegaly. He is not symptomatic. There is no contraindication to remain on aspirin as long as the platelet is greater than 50,000.

## 2016-04-28 NOTE — Assessment & Plan Note (Signed)
The anemia is not consistent with hemolytic anemia, rather consistent with iron deficiency from recent bleeding. He has improved dramatically with oral iron supplements. I recommend he continues taking that daily until I see him back in 6 months

## 2016-05-05 ENCOUNTER — Encounter (INDEPENDENT_AMBULATORY_CARE_PROVIDER_SITE_OTHER): Payer: Self-pay

## 2016-05-05 ENCOUNTER — Ambulatory Visit (INDEPENDENT_AMBULATORY_CARE_PROVIDER_SITE_OTHER): Payer: BC Managed Care – PPO | Admitting: Gastroenterology

## 2016-05-05 ENCOUNTER — Encounter: Payer: Self-pay | Admitting: Gastroenterology

## 2016-05-05 VITALS — BP 116/62 | HR 80

## 2016-05-05 DIAGNOSIS — K920 Hematemesis: Secondary | ICD-10-CM

## 2016-05-05 DIAGNOSIS — Z1211 Encounter for screening for malignant neoplasm of colon: Secondary | ICD-10-CM

## 2016-05-05 DIAGNOSIS — K7460 Unspecified cirrhosis of liver: Secondary | ICD-10-CM

## 2016-05-05 DIAGNOSIS — R188 Other ascites: Principal | ICD-10-CM

## 2016-05-05 MED ORDER — NA SULFATE-K SULFATE-MG SULF 17.5-3.13-1.6 GM/177ML PO SOLN: 1.0000 | Freq: Once | ORAL | 0 refills | Status: AC

## 2016-05-05 NOTE — Patient Instructions (Signed)

## 2016-05-09 ENCOUNTER — Encounter (HOSPITAL_COMMUNITY): Payer: Self-pay | Admitting: *Deleted

## 2016-05-12 ENCOUNTER — Encounter: Payer: Self-pay | Admitting: Gastroenterology

## 2016-05-12 DIAGNOSIS — K7460 Unspecified cirrhosis of liver: Secondary | ICD-10-CM | POA: Insufficient documentation

## 2016-05-12 DIAGNOSIS — Z1211 Encounter for screening for malignant neoplasm of colon: Secondary | ICD-10-CM | POA: Insufficient documentation

## 2016-05-12 DIAGNOSIS — R188 Other ascites: Secondary | ICD-10-CM

## 2016-05-12 NOTE — Progress Notes (Signed)
05/12/2016 Jeffery [name] [id number] Jul 22, 1951   HISTORY OF PRESENT ILLNESS:  This is a 65 year old male who is new to our office and was referred here by his PCP, Dr. Forde Dandy, for evaluation of newly diagnosed ascites and cirrhosis. 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  Patient denies any extensive alcohol use. He says that back in June he got sick one night and vomited blood on 4 occasions. That resolved after that day and has not recurred. He mentioned this to his PCP, which prompted all this evaluation and discovered the cirrhosis, ascites, etc.  He reports that he had some black stools following the vomiting of blood, but that quickly resolved as well. His PCP performed a Fibrosure test that showed F4-c/w cirrhosis, but minimal to no necroinflammatory activity. Hepatitis B and C were negative. Iron studies show a low TIBC but serum iron and iron saturation within normal limits. INR 1.4. Ammonia was 119 with upper limits of normal being 102 on this scale. Patient does not exhibit any signs of encephalopathy currently. Platelets low at 68, albumin low at 2.6. AST 38, ALT 32, alkaline phosphatase 122, total bili 1.77. Hemoglobin slightly low at 11.1 grams. Ultrasound earlier this month showed ascites, question cirrhosis, splenomegaly. He denies any abdominal pain, red blood per rectum, significant abdominal distention, yellowing of his eyes or skin.  The patient lives at home alone and is able to transfer from his wheelchair as well as stand if holding on to something, but cannot stand alone. Does not monitor his weight but tells me that his last weight was around 220 pounds and does not necessarily feel like he has gained or lost any significant weight recently. He is a Glass blower/designer at [employer].  Past Medical History:  Diagnosis Date  . Arthritis   . Asthma   .  Bilateral cataracts   . Blurred vision    improved now  . Cholelithiasis   . Diabetes mellitus    type II  . Eczema    inner wrist  . Hypertension   . Muscle cramps   . Necrotizing fasciitis ([place])   . Neuropathy ([place])   . Rash    wrist and hands since late june  . Retinal degeneration    Past Surgical History:  Procedure Laterality Date  . ABOVE KNEE LEG AMPUTATION     right  . EYE SURGERY  09/2011   bilateral for retinopathy - laser eye surgery  . left metatarsal  2003   5th  . TOE AMPUTATION     Left big toe    reports that he has never smoked. He has never used smokeless tobacco. He reports that he does not drink alcohol or use drugs. family history includes Cancer in his mother. No Known Allergies    Outpatient Encounter Prescriptions as of 05/05/2016  Medication Sig  . aspirin 81 MG tablet Take 81 mg by mouth daily.  Marland Kitchen atorvastatin (LIPITOR) 40 MG tablet Take 40 mg by mouth daily.  . brimonidine (ALPHAGAN P) 0.1 % SOLN 1 drop 2 (two) times daily.  . clotrimazole-betamethasone (LOTRISONE) cream Apply topically 2 (two) times daily.  Marland Kitchen ketoconazole (NIZORAL) 2 % cream Apply topically daily.  Marland Kitchen losartan (COZAAR) 50 MG tablet Take 50 mg by mouth daily.  . [EXPIRED] Na Sulfate-K Sulfate-Mg Sulf 17.5-3.13-1.6 GM/180ML SOLN Take 1 kit by mouth once.   No facility-administered  encounter medications on file as of 05/05/2016.     REVIEW OF SYSTEMS  : All other systems reviewed and negative except where noted in the History of Present Illness.  PHYSICAL EXAM: BP 116/62   Pulse 80  General: Well developed white male in no acute distress; in wheelchair Head: Normocephalic and atraumatic Eyes:  Sclerae anicteric, conjunctiva pink. Ears: Normal auditory acuity Lungs: Clear throughout to auscultation Heart: Regular rate and rhythm Abdomen: Soft, non-distended.  BS present.  Non-tender. Rectal:  Will be done at the time of colonoscopy. Musculoskeletal: Right AKA. Skin: No  lesions on visible extremities Extremities: No edema.  Right AKA.  Neurological: Alert oriented x 4, grossly non-focal Psychological:  Alert and cooperative. Normal mood and affect  ASSESSMENT AND PLAN: -65 year old male with newly diagnosed cirrhosis with associated splenomegaly, mild ascites, mild coagulopathy, thrombocytopenia, elevated ammonia without signs of encephalopathy.  Unknown source. Patient denies alcohol use. We'll perform remaining liver serologies to rule out other causes of chronic liver disease. We'll check AFP tumor marker. -Hematemesis:  4 episodes of this back in June, none since. He needs EGD for further evaluation and to rule out esophageal varices, portal hypertensive gastropathy, etc. -Screening colonoscopy:  Will schedule this as well.  *Both EGD and colonoscopy will be scheduled at [facility] with Dr. Ardis Hughs due to the patient's limited mobility.The risks, benefits, and alternatives to EGD and colonoscopy were discussed with the patient and he consents to proceed.    CC:  Reynold Bowen, MD

## 2016-05-13 NOTE — Progress Notes (Signed)
I agree with the above note, plan 

## 2016-05-15 ENCOUNTER — Other Ambulatory Visit: Payer: Self-pay

## 2016-05-15 MED ORDER — NA SULFATE-K SULFATE-MG SULF 17.5-3.13-1.6 GM/177ML PO SOLN: 1.0000 | Freq: Once | ORAL | 0 refills | Status: AC

## 2016-05-15 NOTE — Progress Notes (Signed)
Pt called needing bowel pre and directions to hospital main entrance, pt instructed tocall dr Christella Hartiganjacobs for bowel pre instructions and given directions to [facility] main entrance.

## 2016-05-15 NOTE — Telephone Encounter (Signed)
The pt is aware that the rx has been sent also a "no more than $50 " voucher was called to the pharmacy.

## 2016-05-18 ENCOUNTER — Encounter (HOSPITAL_COMMUNITY)
Admission: RE | Disposition: A | Discharge status: Home / Self Care | Payer: Self-pay | Source: Ambulatory Visit | Attending: Gastroenterology

## 2016-05-18 ENCOUNTER — Telehealth: Payer: Self-pay

## 2016-05-18 ENCOUNTER — Encounter (HOSPITAL_COMMUNITY): Payer: Self-pay | Admitting: Anesthesiology

## 2016-05-18 ENCOUNTER — Ambulatory Visit (HOSPITAL_COMMUNITY): Payer: BC Managed Care – PPO | Admitting: Anesthesiology

## 2016-05-18 ENCOUNTER — Ambulatory Visit (HOSPITAL_COMMUNITY)
Admission: RE | Admit: 2016-05-18 | Discharge: 2016-05-18 | Disposition: A | Discharge status: Home / Self Care | Payer: BC Managed Care – PPO | Source: Ambulatory Visit | Attending: Gastroenterology | Admitting: Gastroenterology

## 2016-05-18 DIAGNOSIS — Z1211 Encounter for screening for malignant neoplasm of colon: Secondary | ICD-10-CM | POA: Insufficient documentation

## 2016-05-18 DIAGNOSIS — E669 Obesity, unspecified: Secondary | ICD-10-CM | POA: Insufficient documentation

## 2016-05-18 DIAGNOSIS — I8500 Esophageal varices without bleeding: Secondary | ICD-10-CM

## 2016-05-18 DIAGNOSIS — Z89412 Acquired absence of left great toe: Secondary | ICD-10-CM | POA: Diagnosis not present

## 2016-05-18 DIAGNOSIS — K3189 Other diseases of stomach and duodenum: Secondary | ICD-10-CM | POA: Diagnosis not present

## 2016-05-18 DIAGNOSIS — R188 Other ascites: Secondary | ICD-10-CM | POA: Insufficient documentation

## 2016-05-18 DIAGNOSIS — K7460 Unspecified cirrhosis of liver: Secondary | ICD-10-CM | POA: Diagnosis not present

## 2016-05-18 DIAGNOSIS — K920 Hematemesis: Secondary | ICD-10-CM | POA: Diagnosis not present

## 2016-05-18 DIAGNOSIS — Z13810 Encounter for screening for upper gastrointestinal disorder: Secondary | ICD-10-CM

## 2016-05-18 DIAGNOSIS — Z6831 Body mass index (BMI) 31.0-31.9, adult: Secondary | ICD-10-CM | POA: Diagnosis not present

## 2016-05-18 DIAGNOSIS — I10 Essential (primary) hypertension: Secondary | ICD-10-CM | POA: Diagnosis not present

## 2016-05-18 DIAGNOSIS — Z89611 Acquired absence of right leg above knee: Secondary | ICD-10-CM | POA: Insufficient documentation

## 2016-05-18 DIAGNOSIS — K766 Portal hypertension: Secondary | ICD-10-CM | POA: Diagnosis not present

## 2016-05-18 DIAGNOSIS — Z7982 Long term (current) use of aspirin: Secondary | ICD-10-CM | POA: Insufficient documentation

## 2016-05-18 DIAGNOSIS — Z79899 Other long term (current) drug therapy: Secondary | ICD-10-CM | POA: Insufficient documentation

## 2016-05-18 DIAGNOSIS — I8510 Secondary esophageal varices without bleeding: Secondary | ICD-10-CM | POA: Insufficient documentation

## 2016-05-18 DIAGNOSIS — E1142 Type 2 diabetes mellitus with diabetic polyneuropathy: Secondary | ICD-10-CM | POA: Insufficient documentation

## 2016-05-18 HISTORY — PX: ESOPHAGOGASTRODUODENOSCOPY (EGD) WITH PROPOFOL: SHX5813

## 2016-05-18 HISTORY — DX: Essential (primary) hypertension: I10

## 2016-05-18 HISTORY — PX: COLONOSCOPY WITH PROPOFOL: SHX5780

## 2016-05-18 LAB — GLUCOSE, CAPILLARY: GLUCOSE-CAPILLARY: 98 mg/dL (ref 65–99)

## 2016-05-18 SURGERY — COLONOSCOPY WITH PROPOFOL
Anesthesia: Monitor Anesthesia Care

## 2016-05-18 MED ORDER — PROPOFOL 500 MG/50ML IV EMUL
INTRAVENOUS | Status: DC | PRN
  Administered 2016-05-18: 75 ug/kg/min via INTRAVENOUS

## 2016-05-18 MED ORDER — PROPOFOL 500 MG/50ML IV EMUL
INTRAVENOUS | Status: DC | PRN
  Administered 2016-05-18 (×4): 20 ug via INTRAVENOUS
  Administered 2016-05-18: 50 ug via INTRAVENOUS

## 2016-05-18 MED ORDER — PROPOFOL 500 MG/50ML IV EMUL: INTRAVENOUS | Status: DC | PRN

## 2016-05-18 MED ORDER — NADOLOL 20 MG PO TABS: 20.0000 mg | ORAL_TABLET | Freq: Every day | ORAL | 11 refills | Status: DC

## 2016-05-18 MED ORDER — SODIUM CHLORIDE 0.9 % IV SOLN: INTRAVENOUS | Status: DC

## 2016-05-18 MED ORDER — LACTATED RINGERS IV SOLN
INTRAVENOUS | Status: DC
  Administered 2016-05-18: 08:00:00 via INTRAVENOUS

## 2016-05-18 MED ORDER — LIDOCAINE 2% (20 MG/ML) 5 ML SYRINGE
INTRAMUSCULAR | Status: AC
  Filled 2016-05-18: qty 5

## 2016-05-18 MED ORDER — PROPOFOL 10 MG/ML IV BOLUS
INTRAVENOUS | Status: AC
  Filled 2016-05-18: qty 40

## 2016-05-18 SURGICAL SUPPLY — 24 items

## 2016-05-18 NOTE — Telephone Encounter (Signed)
-----   Message from Rachael Feeaniel P Jacobs, MD sent at 05/18/2016  9:04 AM EDT ----- Alexia FreestonePatty, just finished EGD/colonoscopy. He needs recall colonoscopy in 10 years, he needs to come in for BP, HR check in 2 weeks, he needs rov with me (next available), also please remind him about the bloodwork that Jess ordered 2 weeks ago when he was in the office (he never had it done).    Thanks

## 2016-05-18 NOTE — Discharge Instructions (Signed)
Esophagogastroduodenoscopy, Care After °Refer to this sheet in the next few weeks. These instructions provide you with information about caring for yourself after your procedure. Your health care provider may also give you more specific instructions. Your treatment has been planned according to current medical practices, but problems sometimes occur. Call your health care provider if you have any problems or questions after your procedure. °WHAT TO EXPECT AFTER THE PROCEDURE °After your procedure, it is typical to feel: °· Soreness in your throat. °· Pain with swallowing. °· Sick to your stomach (nauseous). °· Bloated. °· Dizzy. °· Fatigued. °HOME CARE INSTRUCTIONS °· Do not eat or drink anything until the numbing medicine (local anesthetic) has worn off and your gag reflex has returned. You will know that the local anesthetic has worn off when you can swallow comfortably. °· Do not drive or operate machinery until directed by your health care provider. °· Take medicines only as directed by your health care provider. °SEEK MEDICAL CARE IF:  °· You cannot stop coughing. °· You are not urinating at all or less than usual. °SEEK IMMEDIATE MEDICAL CARE IF: °· You have difficulty swallowing. °· You cannot eat or drink. °· You have worsening throat or chest pain. °· You have dizziness or lightheadedness or you faint. °· You have nausea or vomiting. °· You have chills. °· You have a fever. °· You have severe abdominal pain. °· You have black, tarry, or bloody stools. °  °This information is not intended to replace advice given to you by your health care provider. Make sure you discuss any questions you have with your health care provider. °  °Document Released: 08/21/2012 Document Revised: 09/25/2014 Document Reviewed: 08/21/2012 °Elsevier Interactive Patient Education ©2016 [employer] °Colonoscopy, Care After °These instructions give you information on caring for yourself after your procedure. Your doctor may also give  you more specific instructions. Call your doctor if you have any problems or questions after your procedure. °HOME CARE °· Do not drive for 24 hours. °· Do not sign important papers or use machinery for 24 hours. °· You may shower. °· You may go back to your usual activities, but go slower for the first 24 hours. °· Take rest breaks often during the first 24 hours. °· Walk around or use warm packs on your belly (abdomen) if you have belly cramping or gas. °· Drink enough fluids to keep your pee (urine) clear or pale yellow. °· Resume your normal diet. Avoid heavy or fried foods. °· Avoid drinking alcohol for 24 hours or as told by your doctor. °· Only take medicines as told by your doctor. °If a tissue sample (biopsy) was taken during the procedure:  °· Do not take aspirin or blood thinners for 7 days, or as told by your doctor. °· Do not drink alcohol for 7 days, or as told by your doctor. °· Eat soft foods for the first 24 hours. °GET HELP IF: °You still have a small amount of blood in your poop (stool) 2-3 days after the procedure. °GET HELP RIGHT AWAY IF: °· You have more than a small amount of blood in your poop. °· You see clumps of tissue (blood clots) in your poop. °· Your belly is puffy (swollen). °· You feel sick to your stomach (nauseous) or throw up (vomit). °· You have a fever. °· You have belly pain that gets worse and medicine does not help. °MAKE SURE YOU: °· Understand these instructions. °· Will watch your condition. °· Will   get help right away if you are not doing well or get worse. °  °This information is not intended to replace advice given to you by your health care provider. Make sure you discuss any questions you have with your health care provider. °  °Document Released: 10/07/2010 Document Revised: 09/09/2013 Document Reviewed: 05/12/2013 °Elsevier Interactive Patient Education ©2016 Elsevier Inc. ° °

## 2016-05-18 NOTE — Telephone Encounter (Signed)
Pt has BP/HR check for 06/01/16 10 am and ROV 08/07/16 330 pm with Dr Christella HartiganJacobs, Needs to have labs now.  Letter mailed to the pt with the above information

## 2016-05-18 NOTE — Op Note (Signed)
[facility] Patient Name: Jeffery [name] Procedure Date: 05/18/2016 MRN: [id number] Attending MD: Rachael Fee , MD Date of Birth: [birth year] CSN: [id number] Age: 65 Admit Type: Outpatient Procedure:                Upper GI endoscopy Indications:              Screening procedure; recently discovered cirrhosis;                            single episode hematemesis 2-3 months ago Providers:                Rachael Fee, MD, Anthony Sar, RN, Lorenda Ishihara, Technician, Roni Bread, CRNA,                            Weston Settle, RN, Beryle Beams, Technician Referring MD:              Medicines:                Monitored Anesthesia Care Complications:            No immediate complications. Estimated blood loss:                            None. Estimated Blood Loss:     Estimated blood loss: none. Procedure:                Pre-Anesthesia Assessment:                           - Prior to the procedure, a History and Physical                            was performed, and patient medications and                            allergies were reviewed. The patient's tolerance of                            previous anesthesia was also reviewed. The risks                            and benefits of the procedure and the sedation                            options and risks were discussed with the patient.                            All questions were answered, and informed consent                            was obtained. Prior Anticoagulants: The patient has  taken no previous anticoagulant or antiplatelet                            agents. ASA Grade Assessment: III - A patient with                            severe systemic disease. After reviewing the risks                            and benefits, the patient was deemed in                            satisfactory condition to undergo the procedure.                            - Prior to the procedure, a History and Physical                            was performed, and patient medications and                            allergies were reviewed. The patient's tolerance of                            previous anesthesia was also reviewed. The risks                            and benefits of the procedure and the sedation                            options and risks were discussed with the patient.                            All questions were answered, and informed consent                            was obtained. Prior Anticoagulants: The patient has                            taken no previous anticoagulant or antiplatelet                            agents. ASA Grade Assessment: III - A patient with                            severe systemic disease. After reviewing the risks                            and benefits, the patient was deemed in                            satisfactory condition to undergo the procedure.  After obtaining informed consent, the endoscope was                            passed under direct vision. Throughout the                            procedure, the patient's blood pressure, pulse, and                            oxygen saturations were monitored continuously. The                            [id number]) scope was introduced through the                            mouth, and advanced to the second part of duodenum.                            The EG-[id number] ([id number]) scope was introduced through                            the and advanced to the. The upper GI endoscopy was                            accomplished without difficulty. The patient                            tolerated the procedure well. Scope In: Scope Out: Findings:      Grade II (medium) varices were found in the lower third of the esophagus       without any stigmata of recent bleeding.      Moderate portal hypertensive gastropathy was  found in the entire       examined stomach. Biopsies were taken from the antrum and body to check       for underlying H. pylori.      The exam was otherwise without abnormality. Impression:               - Grade II (medium sized) esophageal varices.                           - Portal hypertensive gastropathy. Gastric antrum                            and body were biopsied to check for underlying H.                            pylori.                           - The examination was otherwise normal. No gastric                            varices. Moderate Sedation:      N/A- Per Anesthesia Care Recommendation:           -  Patient has a contact number available for                            emergencies. The signs and symptoms of potential                            delayed complications were discussed with the                            patient. Return to normal activities tomorrow.                            Written discharge instructions were provided to the                            patient.                           - Resume previous diet.                           - Continue present medications. Also please start                            nadolol 20mg  pill, one pill once daily to decrease                            the risk of variceal bleeding.                           - Repeat upper endoscopy in 1 year for surveillance.                           - Dr. Christella HartiganJacobs' office will contact you about coming                            back in for the bloodwork that was ordered at your                            last visit. Also you will need BP and HR check in                            our office in 2 weeks to check your tolerance of                            the nadolol that is being started today. Procedure Code(s):        --- Professional ---                           [phone], Esophagogastroduodenoscopy, flexible,                            transoral; with biopsy, single or  multiple Diagnosis Code(s):        ---  Professional ---                           I85.00, Esophageal varices without bleeding                           K76.6, Portal hypertension                           K31.89, Other diseases of stomach and duodenum                           Z13.810, Encounter for screening for upper                            gastrointestinal disorder CPT copyright 2016 American Medical Association. All rights reserved. The codes documented in this report are preliminary and upon coder review may  be revised to meet current compliance requirements. Rachael Fee, MD 05/18/2016 8:58:37 AM This report has been signed electronically. Number of Addenda: 0

## 2016-05-18 NOTE — Anesthesia Postprocedure Evaluation (Signed)
Anesthesia Post Note  Patient: Jeffery [name]  Procedure(s) Performed: Procedure(s) (LRB): COLONOSCOPY WITH PROPOFOL (N/A) ESOPHAGOGASTRODUODENOSCOPY (EGD) WITH PROPOFOL (N/A)  Patient location during evaluation: PACU Anesthesia Type: MAC Level of consciousness: awake and alert Pain management: pain level controlled Vital Signs Assessment: post-procedure vital signs reviewed and stable Respiratory status: spontaneous breathing, nonlabored ventilation, respiratory function stable and patient connected to nasal cannula oxygen Cardiovascular status: stable and blood pressure returned to baseline Anesthetic complications: no    Last Vitals:  Vitals:   05/18/16 0910 05/18/16 0918  BP: (!) 149/73   Pulse: 74 72  Resp: 14 19  Temp:      Last Pain:  Vitals:   05/18/16 0730  TempSrc: Oral                 Idolina Mantell J

## 2016-05-18 NOTE — Interval H&P Note (Signed)
History and Physical Interval Note:  05/18/2016 7:40 AM  Jeffery [name]  has presented today for surgery, with the diagnosis of screening,cirhosis  The various methods of treatment have been discussed with the patient and family. After consideration of risks, benefits and other options for treatment, the patient has consented to  Procedure(s): COLONOSCOPY WITH PROPOFOL (N/A) ESOPHAGOGASTRODUODENOSCOPY (EGD) WITH PROPOFOL (N/A) as a surgical intervention .  The patient's history has been reviewed, patient examined, no change in status, stable for surgery.  I have reviewed the patient's chart and labs.  Questions were answered to the patient's satisfaction.     Rachael FeeJacobs, Daniel P

## 2016-05-18 NOTE — Anesthesia Preprocedure Evaluation (Addendum)
Anesthesia Evaluation  Patient identified by MRN, date of birth, ID band Patient awake    Reviewed: Allergy & Precautions, NPO status , Patient's Chart, lab work & pertinent test results  Airway Mallampati: II  TM Distance: >3 FB Neck ROM: Full    Dental no notable dental hx.    Pulmonary asthma ,    Pulmonary exam normal breath sounds clear to auscultation       Cardiovascular hypertension, Normal cardiovascular exam Rhythm:Regular Rate:Normal     Neuro/Psych negative neurological ROS  negative psych ROS   GI/Hepatic negative GI ROS, (+) Cirrhosis   ascites    ,   Endo/Other  diabetes  Renal/GU negative Renal ROS  negative genitourinary   Musculoskeletal  (+) Arthritis ,   Abdominal (+) + obese,   Peds negative pediatric ROS (+)  Hematology  (+) anemia ,   Anesthesia Other Findings   Reproductive/Obstetrics negative OB ROS                            Anesthesia Physical Anesthesia Plan  ASA: III  Anesthesia Plan: MAC   Post-op Pain Management:    Induction: Intravenous  Airway Management Planned: Natural Airway  Additional Equipment:   Intra-op Plan:   Post-operative Plan:   Informed Consent: I have reviewed the patients History and Physical, chart, labs and discussed the procedure including the risks, benefits and alternatives for the proposed anesthesia with the patient or authorized representative who has indicated his/her understanding and acceptance.   Dental advisory given  Plan Discussed with: CRNA  Anesthesia Plan Comments:         Anesthesia Quick Evaluation

## 2016-05-18 NOTE — Transfer of Care (Signed)
Immediate Anesthesia Transfer of Care Note  Patient: Jeffery [name]  Procedure(s) Performed: Procedure(s): COLONOSCOPY WITH PROPOFOL (N/A) ESOPHAGOGASTRODUODENOSCOPY (EGD) WITH PROPOFOL (N/A)  Patient Location: PACU  Anesthesia Type:MAC  Level of Consciousness:  sedated, patient cooperative and responds to stimulation  Airway & Oxygen Therapy:Patient Spontanous Breathing and Patient connected to face mask oxgen  Post-op Assessment:  Report given to PACU RN and Post -op Vital signs reviewed and stable  Post vital signs:  Reviewed and stable  Last Vitals:  Vitals:   05/18/16 0730  BP: 120/67  Pulse: 91  Resp: 17  Temp: 36.5 C    Complications: No apparent anesthesia complications

## 2016-05-18 NOTE — Op Note (Signed)
[facility] Patient Name: Jeffery [name] Procedure Date: 05/18/2016 MRN: [id number] Attending MD: Rachael Fee , MD Date of Birth: Jun 15, 1951 CSN: [id number] Age: 65 Admit Type: Outpatient Procedure:                Colonoscopy Indications:              Screening for colorectal malignant neoplasm Providers:                Rachael Fee, MD, Anthony Sar, RN, Lorenda Ishihara, Technician, Roni Bread, CRNA,                            Weston Settle, RN, Beryle Beams, Technician,                            Sherald Barge, CRNA Referring MD:              Medicines:                Monitored Anesthesia Care Complications:            No immediate complications. Estimated blood loss:                            None. Estimated Blood Loss:     Estimated blood loss: none. Procedure:                Pre-Anesthesia Assessment:                           - Prior to the procedure, a History and Physical                            was performed, and patient medications and                            allergies were reviewed. The patient's tolerance of                            previous anesthesia was also reviewed. The risks                            and benefits of the procedure and the sedation                            options and risks were discussed with the patient.                            All questions were answered, and informed consent                            was obtained. Prior Anticoagulants: The patient has                            taken no previous anticoagulant  or antiplatelet                            agents. ASA Grade Assessment: III - A patient with                            severe systemic disease. After reviewing the risks                            and benefits, the patient was deemed in                            satisfactory condition to undergo the procedure.                           After obtaining informed  consent, the colonoscope                            was passed under direct vision. Throughout the                            procedure, the patient's blood pressure, pulse, and                            oxygen saturations were monitored continuously. The                            [id number] ([id number] scope was introduced through                            the anus and advanced to the the cecum, identified                            by appendiceal orifice and ileocecal valve. The                            colonoscopy was performed without difficulty. The                            patient tolerated the procedure well. The quality                            of the bowel preparation was excellent. The                            ileocecal valve, appendiceal orifice, and rectum                            were photographed. Scope In: 8:19:16 AM Scope Out: 8:32:17 AM Scope Withdrawal Time: 0 hours 7 minutes 44 seconds  Total Procedure Duration: 0 hours 13 minutes 1 second  Findings:      The entire examined colon appeared normal on direct and retroflexion       views. Impression:               -  The entire examined colon is normal on direct and                            retroflexion views.                           - No specimens collected. Moderate Sedation:      N/A- Per Anesthesia Care Recommendation:           - Patient has a contact number available for                            emergencies. The signs and symptoms of potential                            delayed complications were discussed with the                            patient. Return to normal activities tomorrow.                            Written discharge instructions were provided to the                            patient.                           - Resume previous diet.                           - Continue present medications.                           - Repeat colonoscopy in 10 years for screening                             purposes.                           - EGD now for varices screening. Procedure Code(s):        --- Professional ---                           [phone], Colonoscopy, flexible; diagnostic, including                            collection of specimen(s) by brushing or washing,                            when performed (separate procedure) Diagnosis Code(s):        --- Professional ---                           Z12.11, Encounter for screening for malignant                            neoplasm of colon CPT copyright 2016  American Medical Association. All rights reserved. The codes documented in this report are preliminary and upon coder review may  be revised to meet current compliance requirements. Rachael Fee, MD 05/18/2016 8:47:53 AM This report has been signed electronically. Number of Addenda: 0

## 2016-05-18 NOTE — H&P (View-Only) (Signed)
05/12/2016 Jeffery [name] [id number] 1950/12/26   HISTORY OF PRESENT ILLNESS:  This is a 65 year old male who is new to our office and was referred here by his PCP, Dr. Forde Dandy, for evaluation of newly diagnosed ascites and cirrhosis. 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  Patient denies any extensive alcohol use. He says that back in June he got sick one night and vomited blood on 4 occasions. That resolved after that day and has not recurred. He mentioned this to his PCP, which prompted all this evaluation and discovered the cirrhosis, ascites, etc.  He reports that he had some black stools following the vomiting of blood, but that quickly resolved as well. His PCP performed a Fibrosure test that showed F4-c/w cirrhosis, but minimal to no necroinflammatory activity. Hepatitis B and C were negative. Iron studies show a low TIBC but serum iron and iron saturation within normal limits. INR 1.4. Ammonia was 119 with upper limits of normal being 102 on this scale. Patient does not exhibit any signs of encephalopathy currently. Platelets low at 68, albumin low at 2.6. AST 38, ALT 32, alkaline phosphatase 122, total bili 1.77. Hemoglobin slightly low at 11.1 grams. Ultrasound earlier this month showed ascites, question cirrhosis, splenomegaly. He denies any abdominal pain, red blood per rectum, significant abdominal distention, yellowing of his eyes or skin.  The patient lives at home alone and is able to transfer from his wheelchair as well as stand if holding on to something, but cannot stand alone. Does not monitor his weight but tells me that his last weight was around 220 pounds and does not necessarily feel like he has gained or lost any significant weight recently. He is a Glass blower/designer at [employer].  Past Medical History:  Diagnosis Date  . Arthritis   . Asthma   .  Bilateral cataracts   . Blurred vision    improved now  . Cholelithiasis   . Diabetes mellitus    type II  . Eczema    inner wrist  . Hypertension   . Muscle cramps   . Necrotizing fasciitis ([place])   . Neuropathy ([place])   . Rash    wrist and hands since late june  . Retinal degeneration    Past Surgical History:  Procedure Laterality Date  . ABOVE KNEE LEG AMPUTATION     right  . EYE SURGERY  09/2011   bilateral for retinopathy - laser eye surgery  . left metatarsal  2003   5th  . TOE AMPUTATION     Left big toe    reports that he has never smoked. He has never used smokeless tobacco. He reports that he does not drink alcohol or use drugs. family history includes Cancer in his mother. No Known Allergies    Outpatient Encounter Prescriptions as of 05/05/2016  Medication Sig  . aspirin 81 MG tablet Take 81 mg by mouth daily.  Marland Kitchen atorvastatin (LIPITOR) 40 MG tablet Take 40 mg by mouth daily.  . brimonidine (ALPHAGAN P) 0.1 % SOLN 1 drop 2 (two) times daily.  . clotrimazole-betamethasone (LOTRISONE) cream Apply topically 2 (two) times daily.  Marland Kitchen ketoconazole (NIZORAL) 2 % cream Apply topically daily.  Marland Kitchen losartan (COZAAR) 50 MG tablet Take 50 mg by mouth daily.  . [EXPIRED] Na Sulfate-K Sulfate-Mg Sulf 17.5-3.13-1.6 GM/180ML SOLN Take 1 kit by mouth once.   No facility-administered  encounter medications on file as of 05/05/2016.     REVIEW OF SYSTEMS  : All other systems reviewed and negative except where noted in the History of Present Illness.  PHYSICAL EXAM: BP 116/62   Pulse 80  General: Well developed white male in no acute distress; in wheelchair Head: Normocephalic and atraumatic Eyes:  Sclerae anicteric, conjunctiva pink. Ears: Normal auditory acuity Lungs: Clear throughout to auscultation Heart: Regular rate and rhythm Abdomen: Soft, non-distended.  BS present.  Non-tender. Rectal:  Will be done at the time of colonoscopy. Musculoskeletal: Right AKA. Skin: No  lesions on visible extremities Extremities: No edema.  Right AKA.  Neurological: Alert oriented x 4, grossly non-focal Psychological:  Alert and cooperative. Normal mood and affect  ASSESSMENT AND PLAN: -65 year old male with newly diagnosed cirrhosis with associated splenomegaly, mild ascites, mild coagulopathy, thrombocytopenia, elevated ammonia without signs of encephalopathy.  Unknown source. Patient denies alcohol use. We'll perform remaining liver serologies to rule out other causes of chronic liver disease. We'll check AFP tumor marker. -Hematemesis:  4 episodes of this back in June, none since. He needs EGD for further evaluation and to rule out esophageal varices, portal hypertensive gastropathy, etc. -Screening colonoscopy:  Will schedule this as well.  *Both EGD and colonoscopy will be scheduled at [facility] with Dr. Ardis Hughs due to the patient's limited mobility.The risks, benefits, and alternatives to EGD and colonoscopy were discussed with the patient and he consents to proceed.    CC:  Reynold Bowen, MD

## 2016-05-19 ENCOUNTER — Encounter (HOSPITAL_COMMUNITY): Payer: Self-pay | Admitting: Gastroenterology

## 2016-06-01 ENCOUNTER — Ambulatory Visit: Payer: BC Managed Care – PPO | Admitting: Gastroenterology

## 2016-06-01 VITALS — BP 112/60 | HR 68

## 2016-06-01 DIAGNOSIS — K7469 Other cirrhosis of liver: Secondary | ICD-10-CM

## 2016-06-02 ENCOUNTER — Telehealth: Payer: Self-pay | Admitting: Gastroenterology

## 2016-06-02 ENCOUNTER — Other Ambulatory Visit: Payer: Self-pay

## 2016-06-02 MED ORDER — NADOLOL 40 MG PO TABS: 40.0000 mg | ORAL_TABLET | Freq: Every day | ORAL | 6 refills | Status: DC

## 2016-06-02 NOTE — Telephone Encounter (Signed)
Jeffery [name], I got your note, thanks  His BP was 112/60 and HR 68  Can you contact him to increase his nadolol to 40mg  once daily.  He will probably need a new script for this.  Call in 1 month with 6 refills.  Also he needs repeat RN/CNA visit for BP and HR check again in 2-3 weeks.  Lastly, remind him about the bloodwork that he still has to have done (was ordered by Shanda BumpsJessica 1-2 months ago).  Thanks  DJ

## 2016-06-02 NOTE — Telephone Encounter (Signed)
Pt contacted notified and aware. He agrees to start the Nadolol 40 mg. He has been scheduled for another nuse visit for 06-22-2016 at 330pm

## 2016-06-22 ENCOUNTER — Ambulatory Visit: Payer: BC Managed Care – PPO | Admitting: Gastroenterology

## 2016-06-22 VITALS — BP 108/58 | HR 64

## 2016-06-22 DIAGNOSIS — K7030 Alcoholic cirrhosis of liver without ascites: Secondary | ICD-10-CM

## 2016-06-22 NOTE — Progress Notes (Signed)
While patient here for BP check he asked the following questions:  1. Were his platelets still too low to resume an eye medication, 2. What pain med can he take for his leg/stump, and 3. He is still having right sided mid abdominal pain and diarrhea - has not improved at all.  The first two questions I told him would be better answered by his primary care doctor.  The pain and diarrhea I told him I would discuss with Dr. Larae GroomsJacob's nurse and she would call him back.  Patient agreed.

## 2016-06-23 ENCOUNTER — Telehealth: Payer: Self-pay

## 2016-06-23 NOTE — Telephone Encounter (Signed)
-----   Message from Rachael Feeaniel P Jacobs, MD sent at 06/23/2016  9:46 AM EDT -----   ----- Message ----- From: Donata DuffPatty L [name], RN Sent: 06/22/2016   4:10 PM To: Rachael Feeaniel P Jacobs, MD

## 2016-06-23 NOTE — Progress Notes (Signed)
He did not mention abd pains or diarrhea at his initial consult with Shanda BumpsJessica last month.   Can you please see if he can get a sooner appt with myself (do not double book) or jess prior to the one currently scheduled.  No change in his nadolol for now.  Thanks

## 2016-06-26 ENCOUNTER — Telehealth: Payer: Self-pay

## 2016-06-26 NOTE — Telephone Encounter (Signed)
appt scheduled with Jessica for 06/30/16 230 pm.  Pt will call with any concerns

## 2016-06-26 NOTE — Telephone Encounter (Signed)
-----   Message from Jeanine LuzLeslie K Wrenn, CMA sent at 06/23/2016  3:23 PM EDT ----- Did you see this from Dr. Christella HartiganJacobs?  Would you please call patient for me????     He did not mention abd pains or diarrhea at his initial consult with Shanda BumpsJessica last month.   Can you please see if he can get a sooner appt with myself (do not double book) or jess prior to the one currently scheduled.  No change in his nadolol for now.  Thanks

## 2016-06-30 ENCOUNTER — Ambulatory Visit: Payer: BC Managed Care – PPO | Admitting: Gastroenterology

## 2016-07-13 ENCOUNTER — Ambulatory Visit: Payer: BC Managed Care – PPO | Admitting: Gastroenterology

## 2016-08-07 ENCOUNTER — Encounter: Payer: Self-pay | Admitting: Gastroenterology

## 2016-08-07 ENCOUNTER — Encounter (INDEPENDENT_AMBULATORY_CARE_PROVIDER_SITE_OTHER): Payer: Self-pay

## 2016-08-07 ENCOUNTER — Ambulatory Visit (INDEPENDENT_AMBULATORY_CARE_PROVIDER_SITE_OTHER): Payer: BC Managed Care – PPO | Admitting: Gastroenterology

## 2016-08-07 ENCOUNTER — Other Ambulatory Visit (INDEPENDENT_AMBULATORY_CARE_PROVIDER_SITE_OTHER): Payer: BC Managed Care – PPO

## 2016-08-07 VITALS — BP 126/62 | HR 60

## 2016-08-07 DIAGNOSIS — K7030 Alcoholic cirrhosis of liver without ascites: Secondary | ICD-10-CM

## 2016-08-07 DIAGNOSIS — R197 Diarrhea, unspecified: Secondary | ICD-10-CM

## 2016-08-07 DIAGNOSIS — R188 Other ascites: Secondary | ICD-10-CM

## 2016-08-07 DIAGNOSIS — K7460 Unspecified cirrhosis of liver: Secondary | ICD-10-CM

## 2016-08-07 LAB — COMPREHENSIVE METABOLIC PANEL
ALBUMIN: 2.5 g/dL — AB (ref 3.5–5.2)
ALK PHOS: 114 U/L (ref 39–117)
ALT: 22 U/L (ref 0–53)
AST: 40 U/L — AB (ref 0–37)
BUN: 15 mg/dL (ref 6–23)
CALCIUM: 8.7 mg/dL (ref 8.4–10.5)
CHLORIDE: 108 meq/L (ref 96–112)
CO2: 25 mEq/L (ref 19–32)
CREATININE: 1.26 mg/dL (ref 0.40–1.50)
GFR: 60.81 mL/min (ref >60.00)
Glucose, Bld: 97 mg/dL (ref 70–99)
Potassium: 3.8 mEq/L (ref 3.5–5.1)
SODIUM: 139 meq/L (ref 135–145)
TOTAL PROTEIN: 6.8 g/dL (ref 6.0–8.3)
Total Bilirubin: 3 mg/dL — ABNORMAL HIGH (ref 0.2–1.2)

## 2016-08-07 LAB — CBC WITH DIFFERENTIAL/PLATELET
BASOS PCT: 1.6 % (ref 0.0–3.0)
Basophils Absolute: 0.1 10*3/uL (ref 0.0–0.1)
EOS ABS: 1 10*3/uL — AB (ref 0.0–0.7)
EOS PCT: 20.1 % — AB (ref 0.0–5.0)
HEMATOCRIT: 36.8 % — AB (ref 39.0–52.0)
HEMOGLOBIN: 12.5 g/dL — AB (ref 13.0–17.0)
LYMPHS PCT: 20.8 % (ref 12.0–46.0)
Lymphs Abs: 1.1 10*3/uL (ref 0.7–4.0)
MCHC: 34.1 g/dL (ref 30.0–36.0)
MCV: 99.3 fl (ref 78.0–100.0)
MONO ABS: 0.4 10*3/uL (ref 0.1–1.0)
Monocytes Relative: 7.4 % (ref 3.0–12.0)
Neutro Abs: 2.5 10*3/uL (ref 1.4–7.7)
Neutrophils Relative %: 50.1 % (ref 43.0–77.0)
Platelets: 72 10*3/uL — ABNORMAL LOW (ref 150.0–400.0)
RBC: 3.7 Mil/uL — AB (ref 4.22–5.81)
RDW: 17 % — AB (ref 11.5–15.5)
WBC: 5.1 10*3/uL (ref 4.0–10.5)

## 2016-08-07 LAB — PROTIME-INR
INR: 1.7 ratio — AB (ref 0.8–1.0)
PROTHROMBIN TIME: 17.9 s — AB (ref 9.6–13.1)

## 2016-08-07 NOTE — Patient Instructions (Addendum)
Hepatoma screening with abdominal ultrasound, alpha-fetoprotein.    You have been scheduled for an abdominal ultrasound at [facility]1st floor of hospital) on 08/17/16 at 11 am. Please arrive 15 minutes prior to your appointment for registration. Make certain not to have anything to eat or drink 6 hours prior to your appointment. Should you need to reschedule your appointment, please contact radiology at [phone]. This test typically takes about 30 minutes to perform.   Repeat blood testing today including CBC, complete metabolic profile, total hepatitis A serology, hepatitis B surface antigen hepatitis B surface antibody, ANA level, AMA level, anti-smooth muscle antibody., inr   Stool tests (c. Diff by PCR, C. Diff by toxin, routine stool cultures, ova and parasites).   Please return to see Dr. Christella HartiganJacobs in 3 months.  Please call in 1 month to set up appointment.

## 2016-08-07 NOTE — Progress Notes (Signed)
Review of pertinent gastrointestinal problems: 1. Cirrhosis; suggested by outside imaging (fibrosure test by PCP) 2017. Associated with splenomegaly on that ultrasound. Unclear etiology; thombocytopenia +,   Upper endoscopy 8 2017 Dr. Christella HartiganJacobs showed medium-sized esophageal varices, portal gastropathy positive; he was started on nadolol  Hepatoma screening; none yet  Immunization for a/b; need further a/b serologies  2. Routine risk for colon cancer. Colonoscopy August 2017 was normal.    HPI: This is a  very pleasant 65 year old man whom I last saw the time of colonoscopy and upper endoscopy about 3 months ago. See those results summarized above  Chief complaint is cirrhosis  After his endoscopic procedures I started him on nadolol 20 mg once daily and he has titrated up to 40 mg once daily with good effect on blood pressure, heart rate. Today his heart rate was 60.  He has noticed he is having more and more loose stools. He thinks it started shortly after his colonoscopy. He cannot point to any specific antibiotics that he's taken the past few months. He is seeing no blood but has 3-4 loose stools daily. Sometimes very watery. Sometimes these get him up at night.  No encephalopathy.  ROS: complete GI ROS as described in HPI.  Constitutional:  No unintentional weight loss   Past Medical History:  Diagnosis Date  . Arthritis   . Asthma   . Bilateral cataracts   . Blurred vision    improved now  . Cholelithiasis   . Diabetes mellitus    type II  . Eczema    inner wrist  . Hypertension   . Muscle cramps   . Necrotizing fasciitis (HCC)   . Neuropathy (HCC)   . Rash    wrist and hands since late june  . Retinal degeneration     Past Surgical History:  Procedure Laterality Date  . ABOVE KNEE LEG AMPUTATION     right  . COLONOSCOPY WITH PROPOFOL N/A 05/18/2016   Procedure: COLONOSCOPY WITH PROPOFOL;  Surgeon: Rachael Feeaniel P Rhen Dossantos, MD;  Location: [facility];  Service:  Endoscopy;  Laterality: N/A;  . ESOPHAGOGASTRODUODENOSCOPY (EGD) WITH PROPOFOL N/A 05/18/2016   Procedure: ESOPHAGOGASTRODUODENOSCOPY (EGD) WITH PROPOFOL;  Surgeon: Rachael Feeaniel P Juanluis Guastella, MD;  Location: [facility];  Service: Endoscopy;  Laterality: N/A;  . EYE SURGERY  09/2011   bilateral for retinopathy - laser eye surgery  . left metatarsal  2003   5th  . TOE AMPUTATION     Left big toe    Current Outpatient Prescriptions  Medication Sig Dispense Refill  . aspirin 81 MG tablet Take 81 mg by mouth daily.    Marland Kitchen. atorvastatin (LIPITOR) 40 MG tablet Take 40 mg by mouth daily.  2  . brimonidine (ALPHAGAN P) 0.1 % SOLN 1 drop 2 (two) times daily.    . clotrimazole-betamethasone (LOTRISONE) cream Apply topically 2 (two) times daily.    Marland Kitchen. losartan (COZAAR) 50 MG tablet Take 50 mg by mouth daily.    . nadolol (CORGARD) 40 MG tablet Take 1 tablet (40 mg total) by mouth daily. 30 tablet 6   No current facility-administered medications for this visit.     Allergies as of 08/07/2016  . (No Known Allergies)    Family History  Problem Relation Age of Onset  . Cancer Mother     unknown type    Social History   Social History  . Marital status: Married    Spouse name: N/A  . Number of children: N/A  . Years  of education: N/A   Occupational History  . Not on file.   Social History Main Topics  . Smoking status: Never Smoker  . Smokeless tobacco: Never Used  . Alcohol use No  . Drug use: No  . Sexual activity: Not on file     Comment: computer work. next of kin. Sister Marisue HumbleMAureen next of kin   Other Topics Concern  . Not on file   Social History Narrative  . No narrative on file     Physical Exam: BP 126/62 (BP Location: Left Arm, Patient Position: Sitting, Cuff Size: Normal)   Pulse 60  Constitutional: sits in a wheelchair, chronically ill-appearing Psychiatric: alert and oriented x3 Abdomen: soft, nontender, nondistended, no obvious ascites, no peritoneal signs, normal bowel  sounds Right AKA,  left lower extremity with 1+ pitting edema  Assessment and plan: [age] y.o. 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 See those blood tests in patient instructions below. Muscular restage his liver disease with a complete about profile, coags, CBC.  By the serologies are back he will likely  need immunization for hepatitis A and B. His nadolol dose is 40 mg once daily and since his pulse is 60 we cannot increase that further. I do not see need for starting diuretics, he has 1+ pitting edema in his left ankle.  Hepatoma screening with abdominal ultrasound and alpha-fetoprotein.   He's been having diarrhea since shortly after the colonoscopy. Unclear etiology. He will have a battery of stool tests for infections etiology today. If these are all negative then I will likely start him on scheduled daily or  twice daily Imodium. He will follow up to see me in the office in 3 months and sooner if needed.   Rob Buntinganiel Maxx Pham, MD [facility] 08/07/2016, 3:44 PM

## 2016-08-08 LAB — ANA: ANA: NEGATIVE

## 2016-08-08 LAB — HEPATITIS B SURFACE ANTIGEN: HEP B S AG: NEGATIVE

## 2016-08-08 LAB — HEPATITIS B SURFACE ANTIBODY,QUALITATIVE: Hep B S Ab: NEGATIVE

## 2016-08-08 LAB — HEPATITIS A ANTIBODY, TOTAL: HEP A TOTAL AB: NONREACTIVE

## 2016-08-09 LAB — MITOCHONDRIAL ANTIBODIES

## 2016-08-10 LAB — ANTI-SMOOTH MUSCLE ANTIBODY, IGG: Smooth Muscle Ab: <20 U (ref <20)

## 2016-08-17 ENCOUNTER — Ambulatory Visit (HOSPITAL_COMMUNITY): Payer: BC Managed Care – PPO

## 2016-08-24 ENCOUNTER — Other Ambulatory Visit: Payer: Self-pay

## 2016-08-24 DIAGNOSIS — K7460 Unspecified cirrhosis of liver: Secondary | ICD-10-CM

## 2016-08-25 ENCOUNTER — Ambulatory Visit (HOSPITAL_COMMUNITY): Payer: BC Managed Care – PPO

## 2016-08-31 ENCOUNTER — Ambulatory Visit (INDEPENDENT_AMBULATORY_CARE_PROVIDER_SITE_OTHER): Payer: BC Managed Care – PPO | Admitting: Gastroenterology

## 2016-08-31 DIAGNOSIS — K7460 Unspecified cirrhosis of liver: Secondary | ICD-10-CM

## 2016-08-31 DIAGNOSIS — Z23 Encounter for immunization: Secondary | ICD-10-CM | POA: Diagnosis not present

## 2016-09-01 ENCOUNTER — Ambulatory Visit (HOSPITAL_COMMUNITY)
Admission: RE | Admit: 2016-09-01 | Discharge: 2016-09-01 | Disposition: A | Discharge status: Home / Self Care | Payer: BC Managed Care – PPO | Source: Ambulatory Visit | Attending: Gastroenterology | Admitting: Gastroenterology

## 2016-09-01 DIAGNOSIS — R161 Splenomegaly, not elsewhere classified: Secondary | ICD-10-CM | POA: Diagnosis not present

## 2016-09-01 DIAGNOSIS — K8020 Calculus of gallbladder without cholecystitis without obstruction: Secondary | ICD-10-CM | POA: Diagnosis not present

## 2016-09-01 DIAGNOSIS — R188 Other ascites: Secondary | ICD-10-CM | POA: Insufficient documentation

## 2016-09-01 DIAGNOSIS — K7460 Unspecified cirrhosis of liver: Secondary | ICD-10-CM | POA: Diagnosis present

## 2016-09-01 DIAGNOSIS — N281 Cyst of kidney, acquired: Secondary | ICD-10-CM | POA: Insufficient documentation

## 2016-09-08 ENCOUNTER — Ambulatory Visit (INDEPENDENT_AMBULATORY_CARE_PROVIDER_SITE_OTHER): Payer: BC Managed Care – PPO | Admitting: Gastroenterology

## 2016-09-08 DIAGNOSIS — K7460 Unspecified cirrhosis of liver: Secondary | ICD-10-CM

## 2016-09-08 DIAGNOSIS — Z23 Encounter for immunization: Secondary | ICD-10-CM

## 2016-09-19 ENCOUNTER — Telehealth: Payer: Self-pay | Admitting: Gastroenterology

## 2016-09-19 NOTE — Telephone Encounter (Signed)
Left message for pt to call back  °

## 2016-09-20 NOTE — Telephone Encounter (Signed)
Spoke with pt and he is aware of US results.

## 2016-09-20 NOTE — Telephone Encounter (Signed)
Pt said he was returning your call °

## 2016-10-10 ENCOUNTER — Other Ambulatory Visit: Payer: Self-pay | Admitting: Endocrinology

## 2016-10-10 DIAGNOSIS — L089 Local infection of the skin and subcutaneous tissue, unspecified: Secondary | ICD-10-CM

## 2016-10-11 ENCOUNTER — Inpatient Hospital Stay: Admission: RE | Admit: 2016-10-11 | Payer: BC Managed Care – PPO | Source: Ambulatory Visit

## 2016-10-23 ENCOUNTER — Inpatient Hospital Stay (HOSPITAL_COMMUNITY)
Admission: EM | Admit: 2016-10-23 | Discharge: 2016-11-08 | DRG: 853 | Disposition: A | Discharge status: Skilled Nursing Facility | Payer: BC Managed Care – PPO | Attending: Internal Medicine | Admitting: Internal Medicine

## 2016-10-23 ENCOUNTER — Encounter (HOSPITAL_COMMUNITY): Payer: Self-pay

## 2016-10-23 ENCOUNTER — Inpatient Hospital Stay (HOSPITAL_COMMUNITY): Payer: BC Managed Care – PPO

## 2016-10-23 DIAGNOSIS — K2971 Gastritis, unspecified, with bleeding: Secondary | ICD-10-CM | POA: Diagnosis not present

## 2016-10-23 DIAGNOSIS — Z79899 Other long term (current) drug therapy: Secondary | ICD-10-CM

## 2016-10-23 DIAGNOSIS — R161 Splenomegaly, not elsewhere classified: Secondary | ICD-10-CM | POA: Diagnosis present

## 2016-10-23 DIAGNOSIS — K922 Gastrointestinal hemorrhage, unspecified: Secondary | ICD-10-CM | POA: Diagnosis not present

## 2016-10-23 DIAGNOSIS — R197 Diarrhea, unspecified: Secondary | ICD-10-CM

## 2016-10-23 DIAGNOSIS — Y92239 Unspecified place in hospital as the place of occurrence of the external cause: Secondary | ICD-10-CM | POA: Diagnosis not present

## 2016-10-23 DIAGNOSIS — K7210 Chronic hepatic failure without coma: Secondary | ICD-10-CM | POA: Diagnosis present

## 2016-10-23 DIAGNOSIS — K2210 Ulcer of esophagus without bleeding: Secondary | ICD-10-CM | POA: Diagnosis not present

## 2016-10-23 DIAGNOSIS — K921 Melena: Secondary | ICD-10-CM | POA: Diagnosis not present

## 2016-10-23 DIAGNOSIS — D696 Thrombocytopenia, unspecified: Secondary | ICD-10-CM | POA: Diagnosis not present

## 2016-10-23 DIAGNOSIS — Z515 Encounter for palliative care: Secondary | ICD-10-CM | POA: Diagnosis not present

## 2016-10-23 DIAGNOSIS — R109 Unspecified abdominal pain: Secondary | ICD-10-CM

## 2016-10-23 DIAGNOSIS — D689 Coagulation defect, unspecified: Secondary | ICD-10-CM | POA: Diagnosis present

## 2016-10-23 DIAGNOSIS — A419 Sepsis, unspecified organism: Secondary | ICD-10-CM | POA: Diagnosis present

## 2016-10-23 DIAGNOSIS — R400 Somnolence: Secondary | ICD-10-CM | POA: Diagnosis not present

## 2016-10-23 DIAGNOSIS — I8500 Esophageal varices without bleeding: Secondary | ICD-10-CM | POA: Diagnosis present

## 2016-10-23 DIAGNOSIS — I8510 Secondary esophageal varices without bleeding: Secondary | ICD-10-CM | POA: Diagnosis not present

## 2016-10-23 DIAGNOSIS — R1084 Generalized abdominal pain: Secondary | ICD-10-CM | POA: Diagnosis not present

## 2016-10-23 DIAGNOSIS — E11621 Type 2 diabetes mellitus with foot ulcer: Secondary | ICD-10-CM | POA: Diagnosis present

## 2016-10-23 DIAGNOSIS — K264 Chronic or unspecified duodenal ulcer with hemorrhage: Secondary | ICD-10-CM | POA: Diagnosis not present

## 2016-10-23 DIAGNOSIS — E871 Hypo-osmolality and hyponatremia: Secondary | ICD-10-CM | POA: Diagnosis present

## 2016-10-23 DIAGNOSIS — Z7189 Other specified counseling: Secondary | ICD-10-CM | POA: Diagnosis not present

## 2016-10-23 DIAGNOSIS — D61818 Other pancytopenia: Secondary | ICD-10-CM | POA: Diagnosis present

## 2016-10-23 DIAGNOSIS — K7460 Unspecified cirrhosis of liver: Secondary | ICD-10-CM

## 2016-10-23 DIAGNOSIS — R531 Weakness: Secondary | ICD-10-CM | POA: Diagnosis present

## 2016-10-23 DIAGNOSIS — Y835 Amputation of limb(s) as the cause of abnormal reaction of the patient, or of later complication, without mention of misadventure at the time of the procedure: Secondary | ICD-10-CM | POA: Diagnosis not present

## 2016-10-23 DIAGNOSIS — R195 Other fecal abnormalities: Secondary | ICD-10-CM | POA: Diagnosis not present

## 2016-10-23 DIAGNOSIS — E440 Moderate protein-calorie malnutrition: Secondary | ICD-10-CM | POA: Diagnosis present

## 2016-10-23 DIAGNOSIS — E875 Hyperkalemia: Secondary | ICD-10-CM | POA: Diagnosis present

## 2016-10-23 DIAGNOSIS — I950 Idiopathic hypotension: Secondary | ICD-10-CM | POA: Diagnosis not present

## 2016-10-23 DIAGNOSIS — R4182 Altered mental status, unspecified: Secondary | ICD-10-CM | POA: Diagnosis not present

## 2016-10-23 DIAGNOSIS — T8789 Other complications of amputation stump: Secondary | ICD-10-CM | POA: Diagnosis not present

## 2016-10-23 DIAGNOSIS — D62 Acute posthemorrhagic anemia: Secondary | ICD-10-CM | POA: Diagnosis present

## 2016-10-23 DIAGNOSIS — M86172 Other acute osteomyelitis, left ankle and foot: Secondary | ICD-10-CM | POA: Diagnosis not present

## 2016-10-23 DIAGNOSIS — M869 Osteomyelitis, unspecified: Secondary | ICD-10-CM

## 2016-10-23 DIAGNOSIS — B372 Candidiasis of skin and nail: Secondary | ICD-10-CM | POA: Diagnosis not present

## 2016-10-23 DIAGNOSIS — K2981 Duodenitis with bleeding: Secondary | ICD-10-CM

## 2016-10-23 DIAGNOSIS — N179 Acute kidney failure, unspecified: Secondary | ICD-10-CM

## 2016-10-23 DIAGNOSIS — A0472 Enterocolitis due to Clostridium difficile, not specified as recurrent: Secondary | ICD-10-CM | POA: Diagnosis present

## 2016-10-23 DIAGNOSIS — D589 Hereditary hemolytic anemia, unspecified: Secondary | ICD-10-CM | POA: Diagnosis present

## 2016-10-23 DIAGNOSIS — E1169 Type 2 diabetes mellitus with other specified complication: Secondary | ICD-10-CM | POA: Diagnosis present

## 2016-10-23 DIAGNOSIS — I959 Hypotension, unspecified: Secondary | ICD-10-CM | POA: Diagnosis present

## 2016-10-23 DIAGNOSIS — E7849 Other hyperlipidemia: Secondary | ICD-10-CM | POA: Insufficient documentation

## 2016-10-23 DIAGNOSIS — K529 Noninfective gastroenteritis and colitis, unspecified: Secondary | ICD-10-CM | POA: Diagnosis present

## 2016-10-23 DIAGNOSIS — F418 Other specified anxiety disorders: Secondary | ICD-10-CM | POA: Diagnosis present

## 2016-10-23 DIAGNOSIS — K766 Portal hypertension: Secondary | ICD-10-CM | POA: Diagnosis present

## 2016-10-23 DIAGNOSIS — K3189 Other diseases of stomach and duodenum: Secondary | ICD-10-CM | POA: Diagnosis not present

## 2016-10-23 DIAGNOSIS — T8781 Dehiscence of amputation stump: Secondary | ICD-10-CM | POA: Diagnosis not present

## 2016-10-23 DIAGNOSIS — R188 Other ascites: Secondary | ICD-10-CM

## 2016-10-23 DIAGNOSIS — J45909 Unspecified asthma, uncomplicated: Secondary | ICD-10-CM | POA: Diagnosis present

## 2016-10-23 DIAGNOSIS — Z6833 Body mass index (BMI) 33.0-33.9, adult: Secondary | ICD-10-CM

## 2016-10-23 DIAGNOSIS — E784 Other hyperlipidemia: Secondary | ICD-10-CM | POA: Diagnosis not present

## 2016-10-23 DIAGNOSIS — Z89611 Acquired absence of right leg above knee: Secondary | ICD-10-CM

## 2016-10-23 DIAGNOSIS — Z66 Do not resuscitate: Secondary | ICD-10-CM | POA: Diagnosis not present

## 2016-10-23 DIAGNOSIS — Z89412 Acquired absence of left great toe: Secondary | ICD-10-CM

## 2016-10-23 DIAGNOSIS — F411 Generalized anxiety disorder: Secondary | ICD-10-CM | POA: Diagnosis not present

## 2016-10-23 DIAGNOSIS — L97519 Non-pressure chronic ulcer of other part of right foot with unspecified severity: Secondary | ICD-10-CM | POA: Diagnosis present

## 2016-10-23 DIAGNOSIS — L97529 Non-pressure chronic ulcer of other part of left foot with unspecified severity: Secondary | ICD-10-CM | POA: Diagnosis present

## 2016-10-23 DIAGNOSIS — K7290 Hepatic failure, unspecified without coma: Secondary | ICD-10-CM

## 2016-10-23 DIAGNOSIS — I81 Portal vein thrombosis: Secondary | ICD-10-CM

## 2016-10-23 DIAGNOSIS — R112 Nausea with vomiting, unspecified: Secondary | ICD-10-CM

## 2016-10-23 DIAGNOSIS — L97509 Non-pressure chronic ulcer of other part of unspecified foot with unspecified severity: Secondary | ICD-10-CM

## 2016-10-23 DIAGNOSIS — R1013 Epigastric pain: Secondary | ICD-10-CM | POA: Diagnosis not present

## 2016-10-23 DIAGNOSIS — Z7982 Long term (current) use of aspirin: Secondary | ICD-10-CM

## 2016-10-23 DIAGNOSIS — E118 Type 2 diabetes mellitus with unspecified complications: Secondary | ICD-10-CM | POA: Diagnosis not present

## 2016-10-23 DIAGNOSIS — E669 Obesity, unspecified: Secondary | ICD-10-CM | POA: Diagnosis present

## 2016-10-23 DIAGNOSIS — G629 Polyneuropathy, unspecified: Secondary | ICD-10-CM | POA: Diagnosis present

## 2016-10-23 DIAGNOSIS — K269 Duodenal ulcer, unspecified as acute or chronic, without hemorrhage or perforation: Secondary | ICD-10-CM

## 2016-10-23 DIAGNOSIS — F321 Major depressive disorder, single episode, moderate: Secondary | ICD-10-CM | POA: Diagnosis not present

## 2016-10-23 DIAGNOSIS — E860 Dehydration: Secondary | ICD-10-CM | POA: Diagnosis present

## 2016-10-23 DIAGNOSIS — M86672 Other chronic osteomyelitis, left ankle and foot: Secondary | ICD-10-CM | POA: Diagnosis not present

## 2016-10-23 DIAGNOSIS — B379 Candidiasis, unspecified: Secondary | ICD-10-CM | POA: Diagnosis not present

## 2016-10-23 DIAGNOSIS — M8600 Acute hematogenous osteomyelitis, unspecified site: Secondary | ICD-10-CM | POA: Diagnosis not present

## 2016-10-23 DIAGNOSIS — M1990 Unspecified osteoarthritis, unspecified site: Secondary | ICD-10-CM | POA: Diagnosis present

## 2016-10-23 DIAGNOSIS — I10 Essential (primary) hypertension: Secondary | ICD-10-CM | POA: Diagnosis present

## 2016-10-23 LAB — CBC WITH DIFFERENTIAL/PLATELET
Basophils Absolute: 0 10*3/uL (ref 0.0–0.1)
Basophils Relative: 0 %
Eosinophils Absolute: 0.4 10*3/uL (ref 0.0–0.7)
Eosinophils Relative: 4 %
HEMATOCRIT: 30.2 % — AB (ref 39.0–52.0)
HEMOGLOBIN: 10.5 g/dL — AB (ref 13.0–17.0)
LYMPHS PCT: 10 %
Lymphs Abs: 0.9 10*3/uL (ref 0.7–4.0)
MCH: 34.5 pg — ABNORMAL HIGH (ref 26.0–34.0)
MCHC: 34.8 g/dL (ref 30.0–36.0)
MCV: 99.3 fL (ref 78.0–100.0)
MONO ABS: 0.7 10*3/uL (ref 0.1–1.0)
MONOS PCT: 7 %
NEUTROS ABS: 7.5 10*3/uL (ref 1.7–7.7)
Neutrophils Relative %: 80 %
Platelets: 115 10*3/uL — ABNORMAL LOW (ref 150–400)
RBC: 3.04 MIL/uL — ABNORMAL LOW (ref 4.22–5.81)
RDW: 14.7 % (ref 11.5–15.5)
WBC: 9.5 10*3/uL (ref 4.0–10.5)

## 2016-10-23 LAB — COMPREHENSIVE METABOLIC PANEL
ALBUMIN: 2.1 g/dL — AB (ref 3.5–5.0)
ALT: 26 U/L (ref 17–63)
AST: 45 U/L — AB (ref 15–41)
Alkaline Phosphatase: 76 U/L (ref 38–126)
Anion gap: 8 (ref 5–15)
BUN: 39 mg/dL — ABNORMAL HIGH (ref 6–20)
CHLORIDE: 105 mmol/L (ref 101–111)
CO2: 19 mmol/L — AB (ref 22–32)
CREATININE: 2.49 mg/dL — AB (ref 0.61–1.24)
Calcium: 8.6 mg/dL — ABNORMAL LOW (ref 8.9–10.3)
GFR calc Af Amer: 29 mL/min — ABNORMAL LOW (ref >60)
GFR calc non Af Amer: 25 mL/min — ABNORMAL LOW (ref >60)
GLUCOSE: 157 mg/dL — AB (ref 65–99)
Potassium: 5.4 mmol/L — ABNORMAL HIGH (ref 3.5–5.1)
SODIUM: 132 mmol/L — AB (ref 135–145)
Total Bilirubin: 3.2 mg/dL — ABNORMAL HIGH (ref 0.3–1.2)
Total Protein: 7.1 g/dL (ref 6.5–8.1)

## 2016-10-23 LAB — AMMONIA: Ammonia: 18 umol/L (ref 9–35)

## 2016-10-23 LAB — ABO/RH: ABO/RH(D): A POS

## 2016-10-23 LAB — PROTIME-INR
INR: 1.96
Prothrombin Time: 22.6 seconds — ABNORMAL HIGH (ref 11.4–15.2)

## 2016-10-23 LAB — I-STAT CG4 LACTIC ACID, ED
LACTIC ACID, VENOUS: 1.57 mmol/L (ref 0.5–1.9)
Lactic Acid, Venous: 2.77 mmol/L (ref 0.5–1.9)

## 2016-10-23 LAB — TYPE AND SCREEN
ABO/RH(D): A POS
Antibody Screen: NEGATIVE

## 2016-10-23 LAB — C DIFFICILE QUICK SCREEN W PCR REFLEX
C DIFFICILE (CDIFF) INTERP: NOT DETECTED
C Diff antigen: NEGATIVE
C Diff toxin: NEGATIVE

## 2016-10-23 LAB — POC OCCULT BLOOD, ED: FECAL OCCULT BLD: POSITIVE — AB

## 2016-10-23 MED ORDER — BRIMONIDINE TARTRATE 0.15 % OP SOLN
1.0000 [drp] | Freq: Every day | OPHTHALMIC | Status: DC
  Administered 2016-10-25 – 2016-11-08 (×13): 1 [drp] via OPHTHALMIC
  Filled 2016-10-23 (×4): qty 5

## 2016-10-23 MED ORDER — IOPAMIDOL (ISOVUE-300) INJECTION 61%
INTRAVENOUS | Status: AC
  Filled 2016-10-23: qty 30

## 2016-10-23 MED ORDER — TRAZODONE HCL 50 MG PO TABS: 25.0000 mg | ORAL_TABLET | Freq: Every evening | ORAL | Status: DC | PRN

## 2016-10-23 MED ORDER — SODIUM CHLORIDE 0.9 % IV BOLUS (SEPSIS)
1000.0000 mL | Freq: Once | INTRAVENOUS | Status: AC
  Administered 2016-10-23: 1000 mL via INTRAVENOUS

## 2016-10-23 MED ORDER — ACETAMINOPHEN 325 MG PO TABS
650.0000 mg | ORAL_TABLET | Freq: Four times a day (QID) | ORAL | Status: DC | PRN
  Administered 2016-10-23 – 2016-10-25 (×3): 650 mg via ORAL
  Filled 2016-10-23 (×3): qty 2

## 2016-10-23 MED ORDER — NADOLOL 40 MG PO TABS
40.0000 mg | ORAL_TABLET | Freq: Every day | ORAL | Status: DC
  Administered 2016-10-25 – 2016-10-29 (×5): 40 mg via ORAL
  Filled 2016-10-23 (×8): qty 1

## 2016-10-23 MED ORDER — ASPIRIN EC 81 MG PO TBEC
81.0000 mg | DELAYED_RELEASE_TABLET | Freq: Every day | ORAL | Status: DC
  Administered 2016-10-25 – 2016-10-30 (×6): 81 mg via ORAL
  Filled 2016-10-23 (×6): qty 1

## 2016-10-23 MED ORDER — DEXTROSE 5 % IV SOLN
100.0000 mg | Freq: Two times a day (BID) | INTRAVENOUS | Status: DC
  Administered 2016-10-24 – 2016-10-30 (×13): 100 mg via INTRAVENOUS
  Filled 2016-10-23 (×16): qty 100

## 2016-10-23 MED ORDER — PIPERACILLIN-TAZOBACTAM 3.375 G IVPB
3.3750 g | Freq: Three times a day (TID) | INTRAVENOUS | Status: DC
  Administered 2016-10-24: 3.375 g via INTRAVENOUS
  Filled 2016-10-23: qty 50

## 2016-10-23 MED ORDER — ATORVASTATIN CALCIUM 40 MG PO TABS
40.0000 mg | ORAL_TABLET | Freq: Every day | ORAL | Status: DC
  Administered 2016-10-25 – 2016-10-30 (×6): 40 mg via ORAL
  Filled 2016-10-23 (×8): qty 1

## 2016-10-23 MED ORDER — ACETAMINOPHEN 650 MG RE SUPP
650.0000 mg | Freq: Four times a day (QID) | RECTAL | Status: DC | PRN
Start: 2016-10-23 — End: 2016-10-25

## 2016-10-23 MED ORDER — SODIUM CHLORIDE 0.9 % IV SOLN
INTRAVENOUS | Status: AC
  Administered 2016-10-23: 18:00:00 via INTRAVENOUS

## 2016-10-23 NOTE — ED Notes (Signed)
CT called and notified that patient is ready.

## 2016-10-23 NOTE — ED Provider Notes (Addendum)
[facility]EM[facility]ENCY DEPT Provider Note   CSN: [id number] Arrival date & time: 10/23/16  1116     History   Chief Complaint Chief Complaint  Patient presents with  . Weakness    HPI Jeffery [name] is a 66 y.o. male. Complaint is weakness and diarrhea  HPI:  Patient was diagnosed with cirrhosis over the last few months. Saw Dr. Christella Hartigan. Had hepatoma screaming that was negative. Had antibody screening for hepatitis was negative. He is not an alcoholic and has no history of heavy alcohol use. Is a frequent diarrhea and was supposed to have studies for infection after November 20 office appointment. Those have not been completed as yet. For the last several weeks he said frequent loose stools. Worsening over last 2-3 days. They have now turned black". He felt weak dizzy and lightheaded today to the to the point he could not transfer. He has history of right BKA for diabetic foot infection  He has been on doxycycline for 2 courses of a total of almost 20 days for a right foot ulcer.  He sees [facility] Laurene [facility]man of [facility].  Past Medical History:  Diagnosis Date  . Arthritis   . Asthma   . Bilateral cataracts   . Blurred vision    improved now  . Cholelithiasis   . Diabetes mellitus    type II  . Eczema    inner wrist  . Hypertension   . Muscle cramps   . Necrotizing fasciitis (HCC)   . Neuropathy (HCC)   . Rash    wrist and hands since late june  . Retinal degeneration     Patient Active Problem List   Diagnosis Date Noted  . Esophageal varices without bleeding (HCC)   . Portal hypertensive gastropathy   . Cirrhosis of liver with ascites (HCC) 05/12/2016  . Special screening for malignant neoplasms, colon 05/12/2016  . Coagulopathy (HCC) 04/28/2016  . Hemolytic anemia (HCC) 03/24/2016  . G6PD deficiency anemia (HCC) 03/24/2016  . Thrombocytopenia (HCC) 03/24/2016  . Generalized psoriasis 03/20/2016  . Gilbert syndrome 03/20/2016  . Pancytopenia,  acquired (HCC) 03/20/2016  . Hematemesis without nausea 03/20/2016    Past Surgical History:  Procedure Laterality Date  . ABOVE KNEE LEG AMPUTATION     right  . COLONOSCOPY WITH PROPOFOL N/A 05/18/2016   Procedure: COLONOSCOPY WITH PROPOFOL;  Surgeon: Rachael Fee, MD;  Location: [facility];  Service: Endoscopy;  Laterality: N/A;  . ESOPHAGOGASTRODUODENOSCOPY (EGD) WITH PROPOFOL N/A 05/18/2016   Procedure: ESOPHAGOGASTRODUODENOSCOPY (EGD) WITH PROPOFOL;  Surgeon: Rachael Fee, MD;  Location: [facility];  Service: Endoscopy;  Laterality: N/A;  . EYE SURGERY  09/2011   bilateral for retinopathy - laser eye surgery  . left metatarsal  2003   5th  . TOE AMPUTATION     Left big toe       Home Medications    Prior to Admission medications   Medication Sig Start Date End Date Taking? Authorizing Provider  aspirin 81 MG tablet Take 81 mg by mouth daily.   Yes Historical Provider, MD  atorvastatin (LIPITOR) 40 MG tablet Take 40 mg by mouth daily. 01/31/16  Yes Historical Provider, MD  brimonidine (ALPHAGAN P) 0.1 % SOLN Place 1 drop into both eyes daily.    Yes Historical Provider, MD  doxycycline (VIBRA-TABS) 100 MG tablet Take 100 mg by mouth 2 (two) times daily. 10/10/16 10/25/16 Yes Historical Provider, MD  losartan (COZAAR) 50 MG tablet Take 50 mg by mouth daily.  Yes Historical Provider, MD  Multiple Vitamin (MULTIVITAMIN WITH MINERALS) TABS tablet Take 1 tablet by mouth daily.   Yes Historical Provider, MD  nadolol (CORGARD) 40 MG tablet Take 1 tablet (40 mg total) by mouth daily. 06/02/16  Yes Rachael Feeaniel P Jacobs, MD    Family History Family History  Problem Relation Age of Onset  . Cancer Mother     unknown type    Social History Social History  Substance Use Topics  . Smoking status: Never Smoker  . Smokeless tobacco: Never Used  . Alcohol use No     Allergies   Patient has no known allergies.   Review of Systems Review of Systems  Constitutional: Positive  for appetite change and fatigue. Negative for chills, diaphoresis and fever.  HENT: Negative for mouth sores, sore throat and trouble swallowing.   Eyes: Negative for visual disturbance.  Respiratory: Negative for cough, chest tightness, shortness of breath and wheezing.   Cardiovascular: Negative for chest pain.  Gastrointestinal: Positive for diarrhea. Negative for abdominal distention, abdominal pain, nausea and vomiting.       " Black stools". States he's had "15" today  Endocrine: Negative for polydipsia, polyphagia and polyuria.  Genitourinary: Negative for dysuria, frequency and hematuria.  Musculoskeletal: Negative for gait problem.  Skin: Negative for color change, pallor and rash.  Neurological: Positive for weakness. Negative for dizziness, syncope, light-headedness and headaches.  Hematological: Does not bruise/bleed easily.  Psychiatric/Behavioral: Negative for behavioral problems and confusion.     Physical Exam Updated Vital Signs BP 104/60   Pulse 63   Temp 98.5 F (36.9 C) (Oral)   Resp 18   SpO2 99%   Physical Exam  Constitutional: He is oriented to person, place, and time. He appears well-developed and well-nourished. No distress.  HENT:  Head: Normocephalic.  Eyes: Pupils are equal, round, and reactive to light. No scleral icterus.  Conjunctiva appear pale  Neck: Normal range of motion. Neck supple. No thyromegaly present.  Cardiovascular: Normal rate and regular rhythm.  Exam reveals no gallop and no friction rub.   No murmur heard. Pulmonary/Chest: Effort normal and breath sounds normal. No respiratory distress. He has no wheezes. He has no rales.  Abdominal: Soft. Bowel sounds are normal. He exhibits no distension. There is no tenderness. There is no rebound.  Soft abdomen. No guarding or rebound.  Musculoskeletal: Normal range of motion.  Right AKA. Left foot with wound medially that does not appear acutely cellulitic.Marland Kitchen.  Neurological: He is alert and  oriented to person, place, and time.  Skin: Skin is warm and dry. No rash noted.  Psychiatric: He has a normal mood and affect. His behavior is normal.     ED Treatments / Results  Labs (all labs ordered are listed, but only abnormal results are displayed) Labs Reviewed  COMPREHENSIVE METABOLIC PANEL - Abnormal; Notable for the following:       Result Value   Sodium 132 (*)    Potassium 5.4 (*)    CO2 19 (*)    Glucose, Bld 157 (*)    BUN 39 (*)    Creatinine, Ser 2.49 (*)    Calcium 8.6 (*)    Albumin 2.1 (*)    AST 45 (*)    Total Bilirubin 3.2 (*)    GFR calc non Af Amer 25 (*)    GFR calc Af Amer 29 (*)    All other components within normal limits  CBC WITH DIFFERENTIAL/PLATELET - Abnormal; Notable for the  following:    RBC 3.04 (*)    Hemoglobin 10.5 (*)    HCT 30.2 (*)    MCH 34.5 (*)    Platelets 115 (*)    All other components within normal limits  PROTIME-INR - Abnormal; Notable for the following:    Prothrombin Time 22.6 (*)    All other components within normal limits  I-STAT CG4 LACTIC ACID, ED - Abnormal; Notable for the following:    Lactic Acid, Venous 2.77 (*)    All other components within normal limits  POC OCCULT BLOOD, ED - Abnormal; Notable for the following:    Fecal Occult Bld POSITIVE (*)    All other components within normal limits  CULTURE, BLOOD (ROUTINE X 2)  CULTURE, BLOOD (ROUTINE X 2)  URINE CULTURE  C DIFFICILE QUICK SCREEN W PCR REFLEX  AMMONIA  URINALYSIS, ROUTINE W REFLEX MICROSCOPIC  I-STAT CG4 LACTIC ACID, ED  TYPE AND SCREEN  ABO/RH    EKG  EKG Interpretation None       Radiology No results found.  Procedures Procedures (including critical care time)  Medications Ordered in ED Medications  sodium chloride 0.9 % bolus 1,000 mL (0 mLs Intravenous Stopped 10/23/16 1417)     Initial Impression / Assessment and Plan / ED Course  I have reviewed the triage vital signs and the nursing notes.  Pertinent labs &  imaging results that were available during my care of the patient were reviewed by me and considered in my medical decision making (see chart for details).   hemoglobin at 10. Symptoms are suspicious for C. difficile colitis. He has an acute kidney injury with creatinine of 2.4. Sodium 132 potassium 5.4 CO2 19 lactate of 2.7 his been given IV fluids. Pressure down to 95 before fluids. He will be 10.5 WBC 9.5 platelets 1:15 history of pancytopenia.  C. difficile is pending. His abdomen is benign. Do not suspect this is SBP. He is afebrile. He does feel distended his abdomen. Likely is developing some ascites. Plan will be IV fluids. Hospitalist consultation for admission. Await C. difficile results.    Final Clinical Impressions(s) / ED Diagnoses   Final diagnoses:  Weakness  AKI (acute kidney injury) (HCC)  Diarrhea, unspecified type    New Prescriptions New Prescriptions   No medications on file     Rolland Porter, MD 10/23/16 1505    Rolland Porter, MD 10/23/16 [phone]

## 2016-10-23 NOTE — ED Notes (Signed)
Pt states he fell a few days ago in the shower and has two sores on his bottom.

## 2016-10-23 NOTE — ED Notes (Signed)
Pt had episode of diarrhea, pt cleaned and bed linens changed. Wound pads applied to wounds on buttocks.

## 2016-10-23 NOTE — ED Triage Notes (Signed)
[facility] for weakness and felling like he was going to pass out. Pt has a history of diabetes CBG 141. Pt has a known infection in the left leg that is being treated.

## 2016-10-23 NOTE — ED Notes (Signed)
Pt taken off bedpan and cleaned up. Linens changed.

## 2016-10-23 NOTE — ED Notes (Signed)
Pt placed on bedpan prior to drinking 2nd bottle of contrast.

## 2016-10-23 NOTE — H&P (Signed)
History and Physical    Jeffery [name] HYQ:[id number] DOB: 04-19-1951 DOA: 10/23/2016  PCP: Jeffery HySOUTH,[name], MD  Patient coming from:   Chief Complaint: Generalized weakness, diarrhea  HPI: Jeffery [name] is a 66 y.o. male with medical history significant of liver cirrhosis with asxcities, esophageal varices, portal hypertensive gastropathy, hemolytic anemia, thrombocytopenia, status BKA for diabetic foot infection presented to the emergency room with complaints of abdominal pain and frequent diarrhea. Patient has been tested for hepatitis and screening was negative. He is not an alcoholic and has no previous history of heavy alcohol use. During the last few weeks he's been having frequent loose stools that were worsening in frequency over the last 2 - 3 days  to the point that he is unable to eat because food intake causes him to run to the bathroom continuously and .he has become severely weak. He also c/o having nausea and dry heaves, feeling of fullness in the stomack that despite diarrhea is not getting better, but feeling worse. Patient also reported of becoming very emotional and crying a lot without any apparent reason  He has been treated with tetracycline ( now the second round) for nearly 4 weeks for non healing diabetic ulcer of the left foot and erythema is still persent and he dose not feel that the wound is healing, although erythema has improved  ED Course: In the ED patient was found to be hypotensive with blood pressure 89/49 that responded well to IV fluids with improvement and reading to 114/61 mmHg, was mildly febrile picture of 99.29F CBC revealed hemoglobin of 10.5 which is significantly lower than the previous hemoglobin in November 2017 stashed 12.5 g/dL, platelets were [id number],000 and white blood cells count was normal CMP revealed mildly elevated hyponatremia-132, slightly elevated potassium 5.40, glucose of 157, and abnormal creatinine of 2.49 and DM 39, which is new  finding. He also has elevated AST-41 and hyperbilirubinemia-3.2 Lactic Acid was elevated at 2.77 FEcal occult blood was positive, C.Diff PCR is still pending  Review of Systems: As per HPI otherwise 10 point review of systems negative.   Ambulatory Status: Independent ambulating using right leg prosthesis  Past Medical History:  Diagnosis Date  . Arthritis   . Asthma   . Bilateral cataracts   . Blurred vision    improved now  . Cholelithiasis   . Diabetes mellitus    type II  . Eczema    inner wrist  . Hypertension   . Muscle cramps   . Necrotizing fasciitis (HCC)   . Neuropathy (HCC)   . Rash    wrist and hands since late june  . Retinal degeneration     Past Surgical History:  Procedure Laterality Date  . ABOVE KNEE LEG AMPUTATION     right  . COLONOSCOPY WITH PROPOFOL N/A 05/18/2016   Procedure: COLONOSCOPY WITH PROPOFOL;  Surgeon: Rachael Feeaniel P Jacobs, MD;  Location: [facility];  Service: Endoscopy;  Laterality: N/A;  . ESOPHAGOGASTRODUODENOSCOPY (EGD) WITH PROPOFOL N/A 05/18/2016   Procedure: ESOPHAGOGASTRODUODENOSCOPY (EGD) WITH PROPOFOL;  Surgeon: Rachael Feeaniel P Jacobs, MD;  Location: [facility];  Service: Endoscopy;  Laterality: N/A;  . EYE SURGERY  09/2011   bilateral for retinopathy - laser eye surgery  . left metatarsal  2003   5th  . TOE AMPUTATION     Left big toe    Social History   Social History  . Marital status: Married    Spouse name: N/A  . Number of children: N/A  . Years of  education: N/A   Occupational History  . Not on file.   Social History Main Topics  . Smoking status: Never Smoker  . Smokeless tobacco: Never Used  . Alcohol use No  . Drug use: No  . Sexual activity: Not on file     Comment: computer work. next of kin. Sister Marisue Humble next of kin   Other Topics Concern  . Not on file   Social History Narrative  . No narrative on file    No Known Allergies  Family History  Problem Relation Age of Onset  . Cancer Mother      unknown type    Prior to Admission medications   Medication Sig Start Date End Date Taking? Authorizing Provider  aspirin 81 MG tablet Take 81 mg by mouth daily.   Yes Historical Provider, MD  atorvastatin (LIPITOR) 40 MG tablet Take 40 mg by mouth daily. 01/31/16  Yes Historical Provider, MD  brimonidine (ALPHAGAN P) 0.1 % SOLN Place 1 drop into both eyes daily.    Yes Historical Provider, MD  doxycycline (VIBRA-TABS) 100 MG tablet Take 100 mg by mouth 2 (two) times daily. 10/10/16 10/25/16 Yes Historical Provider, MD  losartan (COZAAR) 50 MG tablet Take 50 mg by mouth daily.   Yes Historical Provider, MD  Multiple Vitamin (MULTIVITAMIN WITH MINERALS) TABS tablet Take 1 tablet by mouth daily.   Yes Historical Provider, MD  nadolol (CORGARD) 40 MG tablet Take 1 tablet (40 mg total) by mouth daily. 06/02/16  Yes Rachael Fee, MD    Physical Exam: Vitals:   10/23/16 1515 10/23/16 1530 10/23/16 1545 10/23/16 1600  BP: 107/64 114/61 100/60 106/59  Pulse: 68 69 69 65  Resp: 19 11 21 17   Temp:      TempSrc:      SpO2: 100% 95% 96% 98%     General: Appears ill looking, very pale elderly male Eyes: PERRLA, EOMI, normal lids, iris ENT:  grossly normal hearing, lips & tongue, mucous membranes moist and intact Neck: no lymphoadenopathy, masses or thyromegaly Cardiovascular: RRR, no m/r/g. No JVD, carotid bruits. No LE edema.  Respiratory: bilateral no wheezes, rales, rhonchi or cracles. Normal respiratory effort. No accessory muscle use observed Abdomen: mildly tender in the right upper quadrant, mildly distended, unable to appreciate organomegaly or masses appreciated, significantly diminished BS present in all quadrants Skin: stump of the left foot has non healing ulcers of the first metatarsal area with erythema extending  Distal 1/2 of the dorsum of the foot Musculoskeletal: grossly normal tone BUE, right LE surgically absent Psychiatric: grossly normal mood and affect, speech fluent and  appropriate, alert and oriented x3 Neurologic: CN II-XII grossly intact, moves all extremities in coordinated fashion, sensation intact  Labs on Admission: I have personally reviewed following labs and imaging studies  CBC, BMP  GFR: CrCl cannot be calculated (Unknown ideal weight.).  Creatinine Clearance: CrCl cannot be calculated (Unknown ideal weight.).  Radiological Exams on Admission: No results found.  EKG: Independently reviewed - sinus rhythm, first degree AV block,   Assessment/Plan Active Problems:   Pancytopenia, acquired (HCC)   Hemolytic anemia (HCC)   Cirrhosis of liver with ascites (HCC)   Esophageal varices without bleeding (HCC)   Portal hypertensive gastropathy   C. difficile colitis     Nausea, abdominal pain, diarrhea - symptoms have been worsening lately Given long antibiotic therapy course, suspect C.Diff colitis, and PCR is pending Continue IV hydration CDiff PCR is pending, start oral Vancomycin for presumed  C/Diff colitis Will order stat abdominal CT and pelvic to r/o ascites, colitis, enteritis   GI bleed with blood loss anemia in patient with h/o hemolytic/G6PD anemia Currently hemodynamically stable, continue to monitor his Hgb and transfuse PRBC's if needed  Thrombocytopenia - currently stable Continue to monitor, transfuse platelets if level falls critically and consider oral  steroids  Liver cirrhosis with known history of ascites Hepatitis testing in outpatient setting was negative Stat abdominal CT is pending  DM type II Hold oral meds, start SSI, monitor CBG's  Nonhealing ulcer of the left foot Will order foot MRI to r/o osteomyelitis Start IV doxycycline and ZOsyn  AKI  Continue IVF and monitor renal function   DVT prophylaxis: Heparin contraindicated due to GI bleeding and patient has right below knee amputation and nonhealing ulcer of the left foot  Code Status: full Family Communication: none Disposition Plan: Med  surg Consults called: none Admission status: inpatient   Raymon Mutton, [place] Pager: [phone] [facility]  If 7PM-7AM, please contact night-coverage [email or web address] Password [email or web address]  10/23/2016, 4:26 PM

## 2016-10-23 NOTE — ED Notes (Addendum)
Tamala JulianBen Griego [phone] Clearence Cheek(Friend) Alphia Mohhris Couch [phone] (Friend) Pt doesn't have family in the area.

## 2016-10-23 NOTE — ED Notes (Signed)
Pt just informed RN that pt has had dark watery stool for the last 3 days

## 2016-10-23 NOTE — ED Notes (Signed)
Patient transported to CT 

## 2016-10-24 ENCOUNTER — Inpatient Hospital Stay (HOSPITAL_COMMUNITY): Payer: BC Managed Care – PPO

## 2016-10-24 ENCOUNTER — Encounter (HOSPITAL_COMMUNITY): Payer: Self-pay | Admitting: *Deleted

## 2016-10-24 DIAGNOSIS — K529 Noninfective gastroenteritis and colitis, unspecified: Secondary | ICD-10-CM

## 2016-10-24 DIAGNOSIS — E11621 Type 2 diabetes mellitus with foot ulcer: Secondary | ICD-10-CM

## 2016-10-24 DIAGNOSIS — R197 Diarrhea, unspecified: Secondary | ICD-10-CM | POA: Diagnosis present

## 2016-10-24 DIAGNOSIS — M8600 Acute hematogenous osteomyelitis, unspecified site: Secondary | ICD-10-CM

## 2016-10-24 DIAGNOSIS — D696 Thrombocytopenia, unspecified: Secondary | ICD-10-CM

## 2016-10-24 DIAGNOSIS — M869 Osteomyelitis, unspecified: Secondary | ICD-10-CM | POA: Insufficient documentation

## 2016-10-24 LAB — CBC
HEMATOCRIT: 29.1 % — AB (ref 39.0–52.0)
HEMOGLOBIN: 10.1 g/dL — AB (ref 13.0–17.0)
MCH: 34.4 pg — ABNORMAL HIGH (ref 26.0–34.0)
MCHC: 34.7 g/dL (ref 30.0–36.0)
MCV: 99 fL (ref 78.0–100.0)
Platelets: 92 10*3/uL — ABNORMAL LOW (ref 150–400)
RBC: 2.94 MIL/uL — ABNORMAL LOW (ref 4.22–5.81)
RDW: 14.6 % (ref 11.5–15.5)
WBC: 7.7 10*3/uL (ref 4.0–10.5)

## 2016-10-24 LAB — COMPREHENSIVE METABOLIC PANEL
ALBUMIN: 1.9 g/dL — AB (ref 3.5–5.0)
ALT: 24 U/L (ref 17–63)
ANION GAP: 8 (ref 5–15)
AST: 42 U/L — AB (ref 15–41)
Alkaline Phosphatase: 62 U/L (ref 38–126)
BUN: 39 mg/dL — AB (ref 6–20)
CO2: 18 mmol/L — ABNORMAL LOW (ref 22–32)
Calcium: 8.1 mg/dL — ABNORMAL LOW (ref 8.9–10.3)
Chloride: 106 mmol/L (ref 101–111)
Creatinine, Ser: 2.16 mg/dL — ABNORMAL HIGH (ref 0.61–1.24)
GFR calc Af Amer: 35 mL/min — ABNORMAL LOW (ref >60)
GFR, EST NON AFRICAN AMERICAN: 30 mL/min — AB (ref >60)
Glucose, Bld: 105 mg/dL — ABNORMAL HIGH (ref 65–99)
POTASSIUM: 4.9 mmol/L (ref 3.5–5.1)
Sodium: 132 mmol/L — ABNORMAL LOW (ref 135–145)
Total Bilirubin: 2.5 mg/dL — ABNORMAL HIGH (ref 0.3–1.2)
Total Protein: 6.6 g/dL (ref 6.5–8.1)

## 2016-10-24 LAB — BODY FLUID CELL COUNT WITH DIFFERENTIAL
Lymphs, Fluid: 12 %
Monocyte-Macrophage-Serous Fluid: 74 % (ref 50–90)
Neutrophil Count, Fluid: 14 % (ref 0–25)
WBC FLUID: 205 uL (ref 0–1000)

## 2016-10-24 LAB — URINALYSIS, ROUTINE W REFLEX MICROSCOPIC
BILIRUBIN URINE: NEGATIVE
Glucose, UA: NEGATIVE mg/dL
Hgb urine dipstick: NEGATIVE
Ketones, ur: NEGATIVE mg/dL
Leukocytes, UA: NEGATIVE
Nitrite: NEGATIVE
PH: 5 (ref 5.0–8.0)
Protein, ur: NEGATIVE mg/dL
SPECIFIC GRAVITY, URINE: 1.017 (ref 1.005–1.030)

## 2016-10-24 LAB — LACTATE DEHYDROGENASE, PLEURAL OR PERITONEAL FLUID: LD FL: 58 U/L — AB (ref 3–23)

## 2016-10-24 LAB — GRAM STAIN

## 2016-10-24 LAB — HEMOGLOBIN AND HEMATOCRIT, BLOOD
HEMATOCRIT: 32.8 % — AB (ref 39.0–52.0)
Hemoglobin: 11.2 g/dL — ABNORMAL LOW (ref 13.0–17.0)

## 2016-10-24 LAB — GLUCOSE, CAPILLARY
GLUCOSE-CAPILLARY: 109 mg/dL — AB (ref 65–99)
GLUCOSE-CAPILLARY: 128 mg/dL — AB (ref 65–99)

## 2016-10-24 LAB — LIPID PANEL
CHOL/HDL RATIO: 3.9 ratio
CHOLESTEROL: 51 mg/dL (ref 0–200)
HDL: 13 mg/dL — ABNORMAL LOW (ref >40)
LDL CALC: 28 mg/dL (ref 0–99)
TRIGLYCERIDES: 48 mg/dL (ref <150)
VLDL: 10 mg/dL (ref 0–40)

## 2016-10-24 LAB — SAVE SMEAR

## 2016-10-24 LAB — MRSA PCR SCREENING: MRSA by PCR: NEGATIVE

## 2016-10-24 MED ORDER — PIPERACILLIN-TAZOBACTAM 3.375 G IVPB
3.3750 g | Freq: Three times a day (TID) | INTRAVENOUS | Status: DC
  Administered 2016-10-24 – 2016-10-30 (×19): 3.375 g via INTRAVENOUS
  Filled 2016-10-24 (×21): qty 50

## 2016-10-24 MED ORDER — LIDOCAINE HCL 1 % IJ SOLN
INTRAMUSCULAR | Status: AC
  Filled 2016-10-24: qty 20

## 2016-10-24 MED ORDER — MORPHINE SULFATE (PF) 4 MG/ML IV SOLN
1.0000 mg | INTRAVENOUS | Status: DC | PRN
  Administered 2016-10-24 – 2016-10-29 (×8): 1 mg via INTRAVENOUS
  Filled 2016-10-24 (×8): qty 1

## 2016-10-24 MED ORDER — INSULIN ASPART 100 UNIT/ML ~~LOC~~ SOLN
0.0000 [IU] | SUBCUTANEOUS | Status: DC
Start: 2016-10-24 — End: 2016-10-28
  Administered 2016-10-25: 2 [IU] via SUBCUTANEOUS
  Administered 2016-10-25 – 2016-10-27 (×4): 1 [IU] via SUBCUTANEOUS
  Administered 2016-10-27: 2 [IU] via SUBCUTANEOUS
  Administered 2016-10-27 – 2016-10-28 (×6): 1 [IU] via SUBCUTANEOUS

## 2016-10-24 MED ORDER — DEXTROSE-NACL 5-0.9 % IV SOLN
INTRAVENOUS | Status: DC
  Administered 2016-10-24 – 2016-10-28 (×6): via INTRAVENOUS

## 2016-10-24 MED ORDER — LIDOCAINE HCL (PF) 1 % IJ SOLN
INTRAMUSCULAR | Status: AC
  Filled 2016-10-24: qty 10

## 2016-10-24 MED ORDER — ONDANSETRON HCL 4 MG/2ML IJ SOLN
4.0000 mg | Freq: Four times a day (QID) | INTRAMUSCULAR | Status: DC | PRN
  Administered 2016-10-24: 4 mg via INTRAVENOUS
  Filled 2016-10-24: qty 2

## 2016-10-24 NOTE — ED Notes (Signed)
Attempted report x1. 

## 2016-10-24 NOTE — Progress Notes (Signed)
VASCULAR LAB PRELIMINARY  ARTERIAL  ABI completed: Right BKA. Left ABI non-compressible with triphasic waveforms. Can compare to previous test 11/2015.    RIGHT    LEFT    PRESSURE WAVEFORM  PRESSURE WAVEFORM  BRACHIAL 107 Tri BRACHIAL N/A due to pt position          AT N/A   AT 118 Tri  PT N/A  PT 254 Tri                  RIGHT LEFT  ABI N/A Non compressible    Farrel DemarkJill Eunice, RDMS, RVT   10/24/2016, 12:07 PM

## 2016-10-24 NOTE — Procedures (Signed)
Ultrasound-guided diagnostic and therapeutic paracentesis performed yielding 3.3 liters of yellow colored fluid. No immediate complications.  Winford Hehn E 10:27 AM 10/24/2016

## 2016-10-24 NOTE — Progress Notes (Signed)
PROGRESS NOTE    Jeffery [name]  ZOX:[id number] DOB: 07/16/1951 DOA: 10/23/2016 PCP: Julian Hy, MD   Brief Narrative:  66 y.o. male PMHx Liver cirrhosis with Ascites, Esophageal varices, Portal Hypertensive gastropathy, Hemolytic anemia, Thrombocytopenia, DM type II uncontrolled with complications, S/P right AKA for DM foot infection,   Presented to the emergency room with complaints of abdominal pain and frequent diarrhea. Patient has been tested for hepatitis and screening was negative. He is not an alcoholic and has no previous history of heavy alcohol use. During the last few weeks he's been having frequent loose stools that were worsening in frequency over the last 2 - 3 days  to the point that he is unable to eat because food intake causes him to run to the bathroom continuously and .he has become severely weak. He also c/o having nausea and dry heaves, feeling of fullness in the stomack that despite diarrhea is not getting better, but feeling worse. Patient also reported of becoming very emotional and crying a lot without any apparent reason  He has been treated with tetracycline ( now the second round) for nearly 4 weeks for non healing diabetic ulcer of the left foot and erythema is still persent and he dose not feel that the wound is healing, although erythema has improved  ED Course: In the ED patient was found to be hypotensive with blood pressure 89/49 that responded well to IV fluids with improvement and reading to 114/61 mmHg, was mildly febrile picture of 99.71F CBC revealed hemoglobin of 10.5 which is significantly lower than the previous hemoglobin in November 2017 stashed 12.5 g/dL, platelets were 811,914 and white blood cells count was normal CMP revealed mildly elevated hyponatremia-132, slightly elevated potassium 5.40, glucose of 157, and abnormal creatinine of 2.49 and DM 39, which is new finding. He also has elevated AST-41 and hyperbilirubinemia-3.2 Lactic Acid  was elevated at 2.77 FEcal occult blood was positive, C.Diff PCR is still pending   Subjective: 2/6  A/O 4, extremely upset concerning is possible amputation of left lower extremity. Also upset concerning ascites.    Assessment & Plan:   Active Problems:   Hemolytic anemia (HCC)   Cirrhosis of liver with ascites (HCC)   Esophageal varices without bleeding (HCC)   Portal hypertensive gastropathy   C. difficile colitis   Nausea, abdominal pain, Diarrhea  - symptoms have been worsening lately -Given long antibiotic therapy course, C.Diff colitis, possible however C. difficile PCR negative  -D5-0.9% saline at 29ml/hr  Colitis -Most likely inflammatory/reactive given that patient has negative leukocytosis, negative fever. -Stool culture pending, may want to involve GI however more important issue is left foot  GI bleed with blood loss anemia  - patient with h/o hemolytic/G6PD anemia -Currently hemodynamically stable -Transfuse for hemoglobin<7 Recent Labs Lab 10/23/16 1138 10/23/16 2340 10/24/16 1243  HGB 10.5* 10.1* 11.2*   Thrombocytopenia - currently stable -Most likely secondary to his osteomyelitis/cellulitis -Should recover Continue to monitor. Obtain peripheral blood smear (will need to contact pathology in the a.m. for read),  -transfuse platelets if level falls critically and consider oral  steroids (ITP?)  Liver cirrhosis with known history of ascites -Hepatitis testing in outpatient setting was negative Stat abdominal CT is pending  DM type II -Hemoglobin A1c pending -Lipid panel pending -Hold oral meds,  -start sensitive SSI  Osteomyelitis/cellulitis Ulcer of the Lt foot -Left foot MRI C/W osteomyelitis -Start IV doxycycline and ZOsyn -Dr. [facility] will see patient -Of foot x-ray pending per surgery  request  Acute renal failure Continue IVF and monitor renal function Lab Results  Component Value Date   CREATININE 2.16 (H)  10/23/2016   CREATININE 2.49 (H) 10/23/2016   CREATININE 1.26 08/07/2016       DVT prophylaxis: SCD Code Status: Full Family Communication: None Disposition Plan: Pending surgical consult   Consultants:  [facility]   Procedures/Significant Events:  2/5 CT abdomen pelvis w contrast: -Cirrhosis with large volume ascites. -Mild relative bowel wall thickening throughout the colon may reflect reactive edema secondary to ascites versus mild colitis. -Cholelithiasis. -Bilateral renal cysts. 2/6 MRI left foot W contrast: -Previous amputation of the right first toe.  -Soft tissue edema consistent with cellulitis and ulceration over the first metatarsal head. Edema in the plantar musculature.  -Focal bone erosion and marrow signal changes at the distal first metatarsal head and involving the proximal phalangeal stump likely indicates focal osteomyelitis.    VENTILATOR SETTINGS:    Cultures 2/5 blood NGTD 2/5 C. difficile negative 2/6 stool culture pending   Antimicrobials: Anti-infectives    Start     Stop   10/24/16 0600  piperacillin-tazobactam (ZOSYN) IVPB 3.375 g    Comments:  Zosyn 3.375 g IV q8h for CrCl > 10 mL/min       10/23/16 2330  doxycycline (VIBRAMYCIN) 100 mg in dextrose 5 % 250 mL IVPB         10/23/16 2330  piperacillin-tazobactam (ZOSYN) IVPB 3.375 g  Status:  Discontinued     10/24/16 0130       Devices    LINES / TUBES:      Continuous Infusions: . doxycycline (VIBRAMYCIN) IV 100 mg (10/24/16 0711)  . piperacillin-tazobactam (ZOSYN)  IV       Objective: Vitals:   10/24/16 0130 10/24/16 0145 10/24/16 0415 10/24/16 0445  BP: 112/65 101/61 110/62 100/56  Pulse: 64 60 60 62  Resp: 15 24 17 15   Temp:      TempSrc:      SpO2: 97% 98% 98% 99%    Intake/Output Summary (Last 24 hours) at 10/24/16 0728 Last data filed at 10/24/16 0400  Gross per 24 hour  Intake             2050 ml  Output                0 ml  Net              2050 ml   There were no vitals filed for this visit.  Examination:  General: A/O 4, NAD, No acute respiratory distress Eyes: negative scleral hemorrhage, negative anisocoria, negative icterus ENT: Negative Runny nose, negative gingival bleeding, Neck:  Negative scars, masses, torticollis, lymphadenopathy, JVD Lungs: Clear to auscultation bilaterally without wheezes or crackles Cardiovascular: Regular rate and rhythm without murmur gallop or rub normal S1 and S2 Abdomen: negative abdominal pain, nondistended, positive soft, bowel sounds, no rebound, no ascites, no appreciable mass Extremities: right AKA, left foot swollen, erythematous, first metatarsal amputated with ulceration under the plantar surface. Remainder of metatarsals erythematous.  Skin:  Psychiatric:  Negative depression, negative anxiety, negative fatigue, negative mania  Central nervous system:  Cranial nerves II through XII intact, tongue/uvula midline, all extremities muscle strength 5/5, sensation intact throughout,negative dysarthria, negative expressive aphasia, negative receptive aphasia. .     Data Reviewed: Care during the described time interval was provided by me .  I have reviewed this patient's available data, including medical history, events of note, physical  examination, and all test results as part of my evaluation. I have personally reviewed and interpreted all radiology studies.  CBC:  Recent Labs Lab 10/23/16 1138 10/23/16 2340  WBC 9.5 7.7  NEUTROABS 7.5  --   HGB 10.5* 10.1*  HCT 30.2* 29.1*  MCV 99.3 99.0  PLT 115* 92*   Basic Metabolic Panel:  Recent Labs Lab 10/23/16 1138 10/23/16 2340  NA 132* 132*  K 5.4* 4.9  CL 105 106  CO2 19* 18*  GLUCOSE 157* 105*  BUN 39* 39*  CREATININE 2.49* 2.16*  CALCIUM 8.6* 8.1*   GFR: CrCl cannot be calculated (Unknown ideal weight.). Liver Function Tests:  Recent Labs Lab 10/23/16 1138 10/23/16 2340  AST 45* 42*  ALT 26 24    ALKPHOS 76 62  BILITOT 3.2* 2.5*  PROT 7.1 6.6  ALBUMIN 2.1* 1.9*   No results for input(s): LIPASE, AMYLASE in the last 168 hours.  Recent Labs Lab 10/23/16 1241  AMMONIA 18   Coagulation Profile:  Recent Labs Lab 10/23/16 1138  INR 1.96   Cardiac Enzymes: No results for input(s): CKTOTAL, CKMB, CKMBINDEX, TROPONINI in the last 168 hours. BNP (last 3 results) No results for input(s): PROBNP in the last 8760 hours. HbA1C: No results for input(s): HGBA1C in the last 72 hours. CBG: No results for input(s): GLUCAP in the last 168 hours. Lipid Profile: No results for input(s): CHOL, HDL, LDLCALC, TRIG, CHOLHDL, LDLDIRECT in the last 72 hours. Thyroid Function Tests: No results for input(s): TSH, T4TOTAL, FREET4, T3FREE, THYROIDAB in the last 72 hours. Anemia Panel: No results for input(s): VITAMINB12, FOLATE, FERRITIN, TIBC, IRON, RETICCTPCT in the last 72 hours. Urine analysis:    Component Value Date/Time   COLORURINE AMBER (A) 10/24/2016 0456   APPEARANCEUR HAZY (A) 10/24/2016 0456   LABSPEC 1.017 10/24/2016 0456   PHURINE 5.0 10/24/2016 0456   GLUCOSEU NEGATIVE 10/24/2016 0456   HGBUR NEGATIVE 10/24/2016 0456   BILIRUBINUR NEGATIVE 10/24/2016 0456   KETONESUR NEGATIVE 10/24/2016 0456   PROTEINUR NEGATIVE 10/24/2016 0456   NITRITE NEGATIVE 10/24/2016 0456   LEUKOCYTESUR NEGATIVE 10/24/2016 0456   Sepsis Labs: @LABRCNTIP (procalcitonin:4,lacticidven:4)  ) Recent Results (from the past 240 hour(s))  C difficile quick scan w PCR reflex     Status: None   Collection Time: 10/23/16  5:47 PM  Result Value Ref Range Status   C Diff antigen NEGATIVE NEGATIVE Final   C Diff toxin NEGATIVE NEGATIVE Final   C Diff interpretation No C. difficile detected.  Final         Radiology Studies: Ct Abdomen Pelvis Wo Contrast  Result Date: 10/23/2016 CLINICAL DATA:  Abdominal pain with diarrhea for 2-3 days EXAM: CT ABDOMEN AND PELVIS WITHOUT CONTRAST TECHNIQUE:  Multidetector CT imaging of the abdomen and pelvis was performed following the standard protocol without IV contrast. COMPARISON:  None. FINDINGS: Lower chest: No acute abnormality. Hepatobiliary: Liver is diminutive in size with a nodular contour consistent with cirrhosis. Large volume abdominal ascites. Cholelithiasis without biliary dilatation. Pancreas: Unremarkable. No pancreatic ductal dilatation or surrounding inflammatory changes. Spleen: Normal in size without focal abnormality. Adrenals/Urinary Tract: Normal adrenal glands. No urolithiasis with or obstructive uropathy. 1.7 x 1.9 cm hypodense, fluid attenuating right interpolar renal mass most consistent with a cyst. 5.4 x 4.8 cm hypodense, fluid attenuating left renal mass most consistent with a cyst. 13 x 9 mm hyperdense exophytic left posterior interpolar renal mass likely reflecting a hemorrhagic or proteinaceous cyst but is technically indeterminate. Normal bladder. Stomach/Bowel:  No bowel dilatation. Mild relative bowel wall thickening throughout the colon which may reflect reactive edema secondary to ascites versus mild colitis. No pneumatosis, pneumoperitoneum or portal venous gas. Normal appendix. Vascular/Lymphatic: Normal caliber abdominal aorta with atherosclerosis. No lymphadenopathy. Reproductive: Prostate is unremarkable. Other: No other fluid collection or hematoma. Musculoskeletal: No lytic or sclerotic osseous lesion. Diffuse thoracolumbar spine spondylosis. IMPRESSION: 1. Cirrhosis with large volume ascites. 2. Mild relative bowel wall thickening throughout the colon which may reflect reactive edema secondary to ascites versus mild colitis. 3. Cholelithiasis. 4. Bilateral renal cysts. Electronically Signed   By: Elige Ko   On: 10/23/2016 21:14   Mr Foot Left Wo Contrast  Result Date: 10/24/2016 CLINICAL DATA:  Nonhealing ulcer at the ball of the foot below were the great toe use to be. Query osteomyelitis. EXAM: MRI OF THE LEFT  FOOT WITHOUT CONTRAST TECHNIQUE: Multiplanar, multisequence MR imaging of the left forefoot was performed. No intravenous contrast was administered. COMPARISON:  CT left lower extremity 04/06/2010 FINDINGS: Bones/Joint/Cartilage Previous amputation of the great toe at the base of the proximal phalanx. Somewhat irregular appearance of the first metatarsal head with increased marrow signal intensity in the distal first metatarsal head as well as in the remaining proximal phalangeal stump. Focal subperiosteal signal intensity along the metatarsal hand. Changes suggest focal osteomyelitis. Scattered marrow signal intensity changes in the intertarsal bones probably represent degenerative change. Hammertoe deformities. Muscles and Tendons Visualized flexor and extensor tendons appear intact with normal signal intensity. Edema in the plantar musculature of the forefoot suggesting inflammatory change. Soft tissues Subcutaneous soft tissue edema around the first metatarsal head and stump with focal ulceration inferior and lateral to the metatarsal head suggesting cellulitis. No loculated fluid collection to suggest abscess. IMPRESSION: Previous amputation of the right first toe. Soft tissue edema consistent with cellulitis and ulceration at the stomach and over the first metatarsal head. Edema in the plantar musculature. Focal bone erosion and marrow signal changes at the distal first metatarsal head and involving the proximal phalangeal stump likely indicates focal osteomyelitis. Electronically Signed   By: Burman Nieves M.D.   On: 10/24/2016 06:28        Scheduled Meds: . aspirin EC  81 mg Oral Daily  . atorvastatin  40 mg Oral Daily  . brimonidine  1 drop Both Eyes Daily  . nadolol  40 mg Oral Daily   Continuous Infusions: . doxycycline (VIBRAMYCIN) IV 100 mg (10/24/16 0711)  . piperacillin-tazobactam (ZOSYN)  IV       LOS: 1 day    Time spent: 40 minutes    WOODS, Roselind Messier, MD [facility] Pager [phone]   If 7PM-7AM, please contact night-coverage [email or web address] Password [email or web address] 10/24/2016, 7:28 AM

## 2016-10-24 NOTE — ED Notes (Signed)
Patient transported to MRI 

## 2016-10-24 NOTE — Consult Note (Addendum)
WOC Nurse wound consult note Reason for Consult: Consult requested for buttocks and left foot.  Pt states he has been followed as an outpatient by a podiatrist and previously had a dry callous which did not require a dressing. The location has evolved into a full thickness wound at this time.  Left plantar foot 2X2X.4cm, red, moist, fluctuant when probed with a swab, small amt tan-red drainage, no odor.  Dry cracked callous surrounding location to 3 cm around the wound. Wound type: Inner bilat buttocks are red, macerated, and painful, with patchy areas of partial thickness skin loss.  Appearance is consistent with moisture associated skin damage. Pt has been having frequent incontinent stools and has possible C-diff. Left hip with partial thickness abrasion from a recent fall at home, according to the patient this occurred to his left back, and bilat hips when he hit some shower bars. Measurement: Left hip 3X.1cm partial thickness abrasion, red and moist, small amt yellow drainage, no odor. Right hip 6X.2X.1cm; same appearance as above. Left back/flank with abrasion; intact skin is dark reddish purple 20X.3cm  Dressing procedure/placement/frequency: Moist gauze dressing to left foot until further input is available from the ortho service; MRI indicates probable osteomylitis to left foot. This complex medical condition is beyond WOC scope of practice. Please consult ortho service for further plan of care.  Barrier cream to buttocks to repel moisture and promote healing.  Air mattress to increase airflow and decrease pressure to affected areas.  Foam dressing to left and right hip abrasions to protect and promote healing. Discussed plan of care with patient and he verbalized understanding. Please re-consult if further assistance is needed.  Thank-you,  Cammie Mcgeeawn Oracio Galen MSN, RN, CWOCN, [place], CNS [phone]

## 2016-10-24 NOTE — ED Notes (Signed)
Patient taken to paracentesis

## 2016-10-25 ENCOUNTER — Encounter (HOSPITAL_COMMUNITY): Payer: Self-pay | Admitting: Certified Registered Nurse Anesthetist

## 2016-10-25 ENCOUNTER — Encounter (HOSPITAL_COMMUNITY)
Admission: EM | Disposition: A | Discharge status: Skilled Nursing Facility | Payer: Self-pay | Source: Home / Self Care | Attending: Internal Medicine

## 2016-10-25 ENCOUNTER — Other Ambulatory Visit (INDEPENDENT_AMBULATORY_CARE_PROVIDER_SITE_OTHER): Payer: Self-pay | Admitting: Orthopaedic Surgery

## 2016-10-25 ENCOUNTER — Inpatient Hospital Stay (HOSPITAL_COMMUNITY): Payer: BC Managed Care – PPO | Admitting: Anesthesiology

## 2016-10-25 DIAGNOSIS — M869 Osteomyelitis, unspecified: Secondary | ICD-10-CM

## 2016-10-25 DIAGNOSIS — M86672 Other chronic osteomyelitis, left ankle and foot: Secondary | ICD-10-CM

## 2016-10-25 HISTORY — PX: I&D EXTREMITY: SHX5045

## 2016-10-25 LAB — GLUCOSE, CAPILLARY
GLUCOSE-CAPILLARY: 142 mg/dL — AB (ref 65–99)
Glucose-Capillary: 105 mg/dL — ABNORMAL HIGH (ref 65–99)
Glucose-Capillary: 160 mg/dL — ABNORMAL HIGH (ref 65–99)
Glucose-Capillary: 104 mg/dL — ABNORMAL HIGH (ref 65–99)
Glucose-Capillary: 104 mg/dL — ABNORMAL HIGH (ref 65–99)

## 2016-10-25 LAB — URINE CULTURE

## 2016-10-25 LAB — HEMOGLOBIN A1C
Hgb A1c MFr Bld: 5.8 % — ABNORMAL HIGH (ref 4.8–5.6)
Mean Plasma Glucose: 120 mg/dL

## 2016-10-25 SURGERY — IRRIGATION AND DEBRIDEMENT EXTREMITY
Anesthesia: General | Site: Foot | Laterality: Left

## 2016-10-25 MED ORDER — FENTANYL CITRATE (PF) 100 MCG/2ML IJ SOLN
INTRAMUSCULAR | Status: DC | PRN
  Administered 2016-10-25: 25 ug via INTRAVENOUS

## 2016-10-25 MED ORDER — LACTATED RINGERS IV SOLN
INTRAVENOUS | Status: DC | PRN
  Administered 2016-10-25: 16:00:00 via INTRAVENOUS

## 2016-10-25 MED ORDER — ONDANSETRON HCL 4 MG/2ML IJ SOLN
INTRAMUSCULAR | Status: AC
  Filled 2016-10-25: qty 2

## 2016-10-25 MED ORDER — PROPOFOL 10 MG/ML IV BOLUS
INTRAVENOUS | Status: AC
  Filled 2016-10-25: qty 20

## 2016-10-25 MED ORDER — OXYCODONE HCL 5 MG PO TABS: 5.0000 mg | ORAL_TABLET | Freq: Once | ORAL | Status: DC | PRN

## 2016-10-25 MED ORDER — ONDANSETRON HCL 4 MG/2ML IJ SOLN: 4.0000 mg | Freq: Four times a day (QID) | INTRAMUSCULAR | Status: DC | PRN

## 2016-10-25 MED ORDER — FENTANYL CITRATE (PF) 100 MCG/2ML IJ SOLN: 25.0000 ug | INTRAMUSCULAR | Status: DC | PRN

## 2016-10-25 MED ORDER — ACETAMINOPHEN 650 MG RE SUPP: 325.0000 mg | Freq: Four times a day (QID) | RECTAL | Status: DC | PRN

## 2016-10-25 MED ORDER — ONDANSETRON HCL 4 MG/2ML IJ SOLN
INTRAMUSCULAR | Status: DC | PRN
  Administered 2016-10-25: 4 mg via INTRAVENOUS

## 2016-10-25 MED ORDER — FENTANYL CITRATE (PF) 100 MCG/2ML IJ SOLN
INTRAMUSCULAR | Status: AC
  Filled 2016-10-25: qty 2

## 2016-10-25 MED ORDER — PHENYLEPHRINE 40 MCG/ML (10ML) SYRINGE FOR IV PUSH (FOR BLOOD PRESSURE SUPPORT)
PREFILLED_SYRINGE | INTRAVENOUS | Status: AC
  Filled 2016-10-25: qty 10

## 2016-10-25 MED ORDER — ACETAMINOPHEN 500 MG PO TABS
500.0000 mg | ORAL_TABLET | Freq: Four times a day (QID) | ORAL | Status: DC | PRN
  Administered 2016-10-26 – 2016-11-05 (×5): 500 mg via ORAL
  Filled 2016-10-25 (×7): qty 1

## 2016-10-25 MED ORDER — MIDAZOLAM HCL 2 MG/2ML IJ SOLN
INTRAMUSCULAR | Status: DC | PRN
  Administered 2016-10-25: 2 mg via INTRAVENOUS

## 2016-10-25 MED ORDER — EPHEDRINE SULFATE 50 MG/ML IJ SOLN
INTRAMUSCULAR | Status: DC | PRN
  Administered 2016-10-25 (×2): 10 mg via INTRAVENOUS

## 2016-10-25 MED ORDER — SODIUM CHLORIDE 0.9 % IR SOLN
Status: DC | PRN
  Administered 2016-10-25: 1000 mL

## 2016-10-25 MED ORDER — LIDOCAINE HCL (CARDIAC) 20 MG/ML IV SOLN
INTRAVENOUS | Status: DC | PRN
  Administered 2016-10-25: 60 mg via INTRAVENOUS

## 2016-10-25 MED ORDER — OXYCODONE HCL 5 MG/5ML PO SOLN: 5.0000 mg | Freq: Once | ORAL | Status: DC | PRN

## 2016-10-25 MED ORDER — PROPOFOL 10 MG/ML IV BOLUS
INTRAVENOUS | Status: DC | PRN
  Administered 2016-10-25: 100 mg via INTRAVENOUS

## 2016-10-25 MED ORDER — MIDAZOLAM HCL 2 MG/2ML IJ SOLN
INTRAMUSCULAR | Status: AC
  Filled 2016-10-25: qty 2

## 2016-10-25 SURGICAL SUPPLY — 45 items
BAG DECANTER FOR FLEXI CONT (MISCELLANEOUS) IMPLANT
BANDAGE ACE 4X5 VEL STRL LF (GAUZE/BANDAGES/DRESSINGS) ×2 IMPLANT
BLADE LONG MED 31X9 (MISCELLANEOUS) ×2 IMPLANT
BNDG COHESIVE 4X5 TAN STRL (GAUZE/BANDAGES/DRESSINGS) ×2 IMPLANT
BNDG GAUZE ELAST 4 BULKY (GAUZE/BANDAGES/DRESSINGS) ×2 IMPLANT
CANISTER WOUND CARE 500ML ATS (WOUND CARE) ×2 IMPLANT
COVER SURGICAL LIGHT HANDLE (MISCELLANEOUS) ×2 IMPLANT
CUFF TOURNIQUET SINGLE 18IN (TOURNIQUET CUFF) ×2 IMPLANT
DRSG EMULSION OIL 3X3 NADH (GAUZE/BANDAGES/DRESSINGS) IMPLANT
DRSG PAD ABDOMINAL 8X10 ST (GAUZE/BANDAGES/DRESSINGS) ×4 IMPLANT
DRSG VAC ATS SM SENSATRAC (GAUZE/BANDAGES/DRESSINGS) ×2 IMPLANT
ELECT REM PT RETURN 9FT ADLT (ELECTROSURGICAL)
ELECTRODE REM PT RTRN 9FT ADLT (ELECTROSURGICAL) IMPLANT
GAUZE SPONGE 4X4 12PLY STRL (GAUZE/BANDAGES/DRESSINGS) ×4 IMPLANT
GAUZE XEROFORM 5X9 LF (GAUZE/BANDAGES/DRESSINGS) ×2 IMPLANT
GLOVE BIOGEL PI IND STRL 8 (GLOVE) ×1 IMPLANT
GLOVE BIOGEL PI INDICATOR 8 (GLOVE) ×1
GLOVE ORTHO TXT STRL SZ7.5 (GLOVE) ×2 IMPLANT
GOWN STRL REUS W/ TWL LRG LVL3 (GOWN DISPOSABLE) ×1 IMPLANT
GOWN STRL REUS W/ TWL XL LVL3 (GOWN DISPOSABLE) ×1 IMPLANT
GOWN STRL REUS W/TWL 2XL LVL3 (GOWN DISPOSABLE) IMPLANT
GOWN STRL REUS W/TWL LRG LVL3 (GOWN DISPOSABLE) ×1
GOWN STRL REUS W/TWL XL LVL3 (GOWN DISPOSABLE) ×1
HANDPIECE INTERPULSE COAX TIP (DISPOSABLE)
KIT BASIN OR (CUSTOM PROCEDURE TRAY) ×2 IMPLANT
KIT ROOM TURNOVER OR (KITS) ×2 IMPLANT
MANIFOLD NEPTUNE II (INSTRUMENTS) ×2 IMPLANT
NS IRRIG 1000ML POUR BTL (IV SOLUTION) ×2 IMPLANT
PACK ORTHO EXTREMITY (CUSTOM PROCEDURE TRAY) ×2 IMPLANT
PAD ARMBOARD 7.5X6 YLW CONV (MISCELLANEOUS) ×4 IMPLANT
SCRUB BETADINE 4OZ XXX (MISCELLANEOUS) ×2 IMPLANT
SET HNDPC FAN SPRY TIP SCT (DISPOSABLE) IMPLANT
SOLUTION BETADINE 4OZ (MISCELLANEOUS) ×2 IMPLANT
SPONGE LAP 18X18 X RAY DECT (DISPOSABLE) ×2 IMPLANT
SPONGE LAP 4X18 X RAY DECT (DISPOSABLE) ×2 IMPLANT
STOCKINETTE IMPERVIOUS 9X36 MD (GAUZE/BANDAGES/DRESSINGS) ×2 IMPLANT
SUT ETHILON 2 0 FS 18 (SUTURE) ×4 IMPLANT
SUT ETHILON 4 0 PS 2 18 (SUTURE) IMPLANT
TOWEL OR 17X24 6PK STRL BLUE (TOWEL DISPOSABLE) ×2 IMPLANT
TOWEL OR 17X26 10 PK STRL BLUE (TOWEL DISPOSABLE) ×2 IMPLANT
TUBE ANAEROBIC SPECIMEN COL (MISCELLANEOUS) IMPLANT
TUBE CONNECTING 12X1/4 (SUCTIONS) ×2 IMPLANT
UNDERPAD 30X30 (UNDERPADS AND DIAPERS) ×2 IMPLANT
WATER STERILE IRR 1000ML POUR (IV SOLUTION) ×2 IMPLANT
YANKAUER SUCT BULB TIP NO VENT (SUCTIONS) ×2 IMPLANT

## 2016-10-25 NOTE — Transfer of Care (Signed)
Immediate Anesthesia Transfer of Care Note  Patient: Jeffery FallenKenneth Luo  Procedure(s) Performed: Procedure(s): LEFT FOOT DEBRIDEMENT RAY AMPUTATION PLACEMENT OF WOUND VAC (Left)  Patient Location: PACU  Anesthesia Type:General  Level of Consciousness: awake  Airway & Oxygen Therapy: Patient Spontanous Breathing and Patient connected to nasal cannula oxygen  Post-op Assessment: Report given to RN and Post -op Vital signs reviewed and stable  Post vital signs: Reviewed and stable  Last Vitals:  Vitals:   10/25/16 1500 10/25/16 1600  BP: 97/65 98/72  Pulse: (!) 58 (!) 58  Resp: 16 16  Temp: 36.4 C     Last Pain:  Vitals:   10/25/16 1600  TempSrc:   PainSc: Asleep      Patients Stated Pain Goal: 2 (10/25/16 1345)  Complications: No apparent anesthesia complications

## 2016-10-25 NOTE — Progress Notes (Signed)
Patient ID: Jeffery BoydenKenneth [name], male   DOB: 01/11/1951, 66 y.o.   MRN: [id number] Orthopedic Plan ---------    IV ABX times at least 48 hrs.  Intra -op culture of pus at metatarsal head was obtained in OR.    ABX after that based on cultures,  PO vs IV .   VAC is an incisional VAC and it will be removed on Friday and then daily dressing changes with Dry dressing.  Darco shoe ordered and he uses this with transfers.   Office Dr. Ophelia CharterYates in 2 wks.   My cell [phone].

## 2016-10-25 NOTE — Interval H&P Note (Signed)
History and Physical Interval Note:  10/25/2016 4:32 PM  Jeffery FallenKenneth Hosang  has presented today for surgery, with the diagnosis of Left Foot Osteomyelitis Great Toe  The various methods of treatment have been discussed with the patient and family. After consideration of risks, benefits and other options for treatment, the patient has consented to  Procedure(s): LEFT FOOT DEBRIDEMENT, POSSIBLE RAY AMPUTATION (Left) as a surgical intervention .  The patient's history has been reviewed, patient examined, no change in status, stable for surgery.  I have reviewed the patient's chart and labs.  Questions were answered to the patient's satisfaction.     Eldred MangesMark C Reeya Bound

## 2016-10-25 NOTE — Care Management Note (Addendum)
Case Management Note  Patient Details  Name: Jeffery BoydenKenneth Antonelli MRN: [id number] Date of Birth: April 28, 1951  Subjective/Objective:    Adm w c diff colitis                Action/Plan: lives at home alone, pcp dr Evlyn [place]annersouth, is college professor. Dr Evlyn Kannersouth states not a lot of support. Will need to follow closely for dc needs.   Expected Discharge Date:                  Expected Discharge Plan:  Home w Home Health Services  In-House Referral:     Discharge planning Services     Post Acute Care Choice:    Choice offered to:     DME Arranged:    DME Agency:     HH Arranged:    HH Agency:     Status of Service:  In process, will continue to follow  If discussed at Long Length of Stay Meetings, dates discussed:    Additional Comments:will moniter for dc needs as pt progresses.  Hanley Haysowell, Laityn Bensen T, RN 10/25/2016, 9:51 AM

## 2016-10-25 NOTE — Anesthesia Preprocedure Evaluation (Signed)
Anesthesia Evaluation  Patient identified by MRN, date of birth, ID band Patient awake    Reviewed: Allergy & Precautions, H&P , NPO status , Patient's Chart, lab work & pertinent test results  Airway Mallampati: II   Neck ROM: full    Dental   Pulmonary asthma ,    breath sounds clear to auscultation       Cardiovascular hypertension,  Rhythm:regular Rate:Normal     Neuro/Psych    GI/Hepatic   Endo/Other  diabetes, Type 2  Renal/GU Renal InsufficiencyRenal disease     Musculoskeletal  (+) Arthritis ,   Abdominal   Peds  Hematology   Anesthesia Other Findings   Reproductive/Obstetrics                             Anesthesia Physical Anesthesia Plan  ASA: III  Anesthesia Plan: General   Post-op Pain Management:    Induction: Intravenous  Airway Management Planned: LMA  Additional Equipment:   Intra-op Plan:   Post-operative Plan:   Informed Consent: I have reviewed the patients History and Physical, chart, labs and discussed the procedure including the risks, benefits and alternatives for the proposed anesthesia with the patient or authorized representative who has indicated his/her understanding and acceptance.     Plan Discussed with: CRNA, Anesthesiologist and Surgeon  Anesthesia Plan Comments:         Anesthesia Quick Evaluation

## 2016-10-25 NOTE — Op Note (Signed)
Preop diagnosis: Left great toe distal first metatarsal osteomyelitis with grade 4 Wagner diabetic ulcer.  Postop diagnosis: Same  Procedure: Left first ray amputation (transmetatarsal, single ray )  Surgeon: Annell GreeningMark Viktorya Arguijo M.D.  Anesthesia Gen.  Tourniquet Tourniquet 250 pressure 9 minutes  Closure 2-0 nylon skin with incisional VAC placement  Procedure after a Betadine scrub and paint with the midcalf tourniquet leg was elevated after extreme sheets and drapes were applied tourniquet inflated timeout procedure was completed patient started on vancomycin and Zosyn and just had a Zosyn dose given. No additional antibiotic was indicated. Skin marker was used for racket handle incision. Patient had a 1 cm ulcer along the medial plantar aspect of the first metatarsal head with diagnostic imaging including x-ray demonstrating first metatarsal osteomyelitis with some destruction of the articular surface of the metatarsal head. Proximally the first ray shaft was exposed Hohmann retractors were placed oscillating saw was used to make an oblique cut and then sharp dissection with a scalpel was used to does provide the flexors extensors and excised the first ray. This was removed and on a separate table where the ulcer is present it was probed and anaerobic culture was obtained. Copious irrigation was performed. Bone at 3 amputation level looked normal no evidence of osteomyelitis. Flexors were cut back on stretch. Digital artery was coagulated. He had good skin bleeding. The tourniquet was deflated hemostasis obtained and then reapproximation of the skin with 2-0 nylon and incisional VAC was placed followed by Curlex and Coban and. Patient was transferred recovery room stable condition.

## 2016-10-25 NOTE — Progress Notes (Signed)
[facility] TEAM 1 - Stepdown/ICU TEAM  Jeffery FallenKenneth [name]  JYN:[id number] DOB: Dec 03, 1950 DOA: 10/23/2016 PCP: [name],[name], MD    Brief Narrative:  66 y.o.maleHx Liver cirrhosis with Ascites, Esophageal varices, Portal Hypertensive gastropathy, Hemolytic anemia, Thrombocytopenia, DM2, and S/P right AKA for DM foot infection who presented to the emergency room with complaints of abdominal pain and frequent diarrhea. He was unable to eat because food intake caused him to run to the bathroom and he became severely weak.  He had been treated with tetracycline (now the second round) for nearly 4 weeks for a non healing diabetic ulcer of the left foot and did not feel that the wound was healing.  Subjective: The patient is resting comfortably in bed.  He denies any diarrhea since last night.  He denies chest pain shortness breath fevers or chills.  He complains of back pain related to "the terrible hospital beds."  He reports no new pain in his left foot.  Assessment & Plan:  Acute on Chronic intermittent Diarrhea/ Colitis  C. 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 - relatively stable at this time - no spontaneous bleeding   Recent Labs Lab 10/23/16 1138 10/23/16 2340  PLT 115* 92*    DM2 CBG well controlled - A1c 5.8  Osteomyelitis L first metatarsal head  Ortho following - to have first ray amputation   Acute renal  failure  Recent Labs Lab 10/23/16 1138 10/23/16 2340  CREATININE 2.49* 2.16*    DVT prophylaxis: SCDs Code Status: FULL CODE Family Communication: no family present at time of exam  Disposition Plan: to OR today - SDU post-op but if stable could likely transfer to Ortho floor bed 10/26/16  Consultants:  Ortho  Procedures: Paracentesis 10/24/16 - 3.3L yellow fluid   Antimicrobials:  Zosyn 2/5 > Doxycycline 2/6 >  Objective: Blood pressure (!) 89/70, pulse 80, temperature 97.3 F (36.3 C), temperature source Oral, resp. rate 14, SpO2 100 %.  Intake/Output Summary (Last 24 hours) at 10/25/16 1039 Last data filed at 10/25/16 0800  Gross per 24 hour  Intake                0 ml  Output              500 ml  Net             -500 ml   Examination: General: No acute respiratory distress Lungs: Clear to auscultation bilaterally without wheezes or crackles Cardiovascular: Regular rate and rhythm without murmur gallop or rub normal S1 and S2 Abdomen: Nontender, nondistended, soft, bowel sounds positive, no rebound, no ascites, no appreciable mass Extremities: No significant cyanosis, clubbing, or edema L LE   CBC:  Recent Labs Lab 10/23/16 1138 10/23/16 2340 10/24/16 1243  WBC 9.5 7.7  --   NEUTROABS 7.5  --   --   HGB 10.5* 10.1* 11.2*  HCT 30.2* 29.1* 32.8*  MCV 99.3 99.0  --   PLT 115* 92*  --    Basic Metabolic Panel:  Recent Labs Lab 10/23/16 1138 10/23/16 2340  NA 132* 132*  K 5.4* 4.9  CL 105 106  CO2 19* 18*  GLUCOSE 157* 105*  BUN 39* 39*  CREATININE 2.49* 2.16*  CALCIUM 8.6* 8.1*   GFR: CrCl cannot be calculated (Unknown ideal weight.).  Liver Function Tests:  Recent Labs Lab 10/23/16 1138 10/23/16 2340  AST 45* 42*  ALT 26 24  ALKPHOS 76 62  BILITOT 3.2* 2.5*  PROT 7.1 6.6  ALBUMIN 2.1* 1.9*   No results for input(s): LIPASE, AMYLASE in the last 168 hours.  Recent Labs Lab 10/23/16 1241  AMMONIA 18    Coagulation  Profile:  Recent Labs Lab 10/23/16 1138  INR 1.96    HbA1C: Hgb A1c MFr Bld  Date/Time Value Ref Range Status  10/24/2016 07:01 PM 5.8 (H) 4.8 - 5.6 % Final    Comment:    (NOTE)         Pre-diabetes: 5.7 - 6.4         Diabetes: >6.4         Glycemic control for adults with diabetes: <7.0     CBG:  Recent Labs Lab 10/24/16 1947 10/24/16 2343 10/25/16 0340 10/25/16 0748  GLUCAP 109* 128* 105* 142*    Recent Results (from the past 240 hour(s))  Culture, blood (Routine x 2)     Status: None (Preliminary result)   Collection Time: 10/23/16 11:40 AM  Result Value Ref Range Status   Specimen Description BLOOD RIGHT ANTECUBITAL  Final   Special Requests   Final    BOTTLES DRAWN AEROBIC AND ANAEROBIC 10CC AER 5CC ANA   Culture NO GROWTH 1 DAY  Final   Report Status PENDING  Incomplete  Culture, blood (Routine x 2)     Status: None (Preliminary result)   Collection Time: 10/23/16 11:54 AM  Result Value Ref Range Status   Specimen Description BLOOD RIGHT HAND  Final   Special Requests BOTTLES DRAWN AEROBIC ONLY 10CC  Final   Culture NO GROWTH 1 DAY  Final   Report Status PENDING  Incomplete  C difficile quick scan w PCR reflex     Status: None   Collection Time: 10/23/16  5:47 PM  Result Value Ref Range Status   C Diff antigen NEGATIVE NEGATIVE Final   C Diff toxin NEGATIVE NEGATIVE Final   C Diff interpretation No C. difficile detected.  Final  Urine culture     Status: Abnormal   Collection Time: 10/24/16  4:56 AM  Result Value Ref Range Status   Specimen Description URINE, RANDOM  Final   Special Requests NONE  Final   Culture <10,000 COLONIES/mL INSIGNIFICANT GROWTH (A)  Final   Report Status 10/25/2016 FINAL  Final  Culture, body fluid-bottle     Status: None (Preliminary result)   Collection Time: 10/24/16 11:10 AM  Result Value Ref Range Status   Specimen Description PERITONEAL  Final   Special Requests NONE  Final   Culture NO GROWTH < 12 HOURS  Final    Report Status PENDING  Incomplete  Gram stain     Status: None   Collection Time: 10/24/16 11:10 AM  Result Value Ref Range Status   Specimen Description PERITONEAL  Final   Special Requests NONE  Final   Gram Stain   Final    RARE WBC PRESENT,BOTH  PMN AND MONONUCLEAR NO ORGANISMS SEEN    Report Status 10/24/2016 FINAL  Final  MRSA PCR Screening     Status: None   Collection Time: 10/24/16  5:12 PM  Result Value Ref Range Status   MRSA by PCR NEGATIVE NEGATIVE Final    Comment:        The GeneXpert MRSA Assay (FDA approved for NASAL specimens only), is one component of a comprehensive MRSA colonization surveillance program. It is not intended to diagnose MRSA infection nor to guide or monitor treatment for MRSA infections.      Scheduled Meds: . aspirin EC  81 mg Oral Daily  . atorvastatin  40 mg Oral Daily  . brimonidine  1 drop Both Eyes Daily  . doxycycline (VIBRAMYCIN) IV  100 mg Intravenous Q12H  . insulin aspart  0-9 Units Subcutaneous Q4H  . nadolol  40 mg Oral Daily  . piperacillin-tazobactam (ZOSYN)  IV  3.375 g Intravenous Q8H   Continuous Infusions: . dextrose 5 % and 0.9% NaCl 75 mL/hr at 10/25/16 0545     LOS: 2 days   Lonia Blood, MD [facility] Office  [phone] Pager - Text Page per Loretha Stapler as per below:  On-Call/Text Page:      [email or web address]      password [email or web address]  If 7PM-7AM, please contact night-coverage [email or web address] Password [facility] 10/25/2016, 10:39 AM

## 2016-10-25 NOTE — Consult Note (Signed)
Reason for Consult:left foot Wagner grade 4 ulcer with first Metatarsal head osteomyelitis Referring Physician: Sheral Flow MD  Cody Albus is an 66 y.o. male.  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 Study show osteomyelitis by plain radiograph as well as MRI of the first metatarsal head with sesamoid involvement. 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 Patient's had previous great toe amputation in the past at same time when he presented originally with undiagnosed diabetes with an ulcer on his right foot. He originally had a BKA on the right and then had a revision to above-knee amputation due to ongoing infection and sepsis. Patient has renal disease. He also has cirrhosis and denies alcohol abuse. No history of hepatitis. Does have pes planus past history of cholelithiasis. A1c is 5.8. Patient working on his computer on the class work.  Past Medical History:  Diagnosis Date  . Arthritis   . Asthma   . Bilateral cataracts   . Blurred vision    improved now  . Cholelithiasis   . Diabetes mellitus    type II  . Eczema    inner wrist  . Hypertension   . Muscle cramps   . Necrotizing fasciitis ([place])   . Neuropathy ([place])   . Rash    wrist and hands since late june  . Retinal degeneration     Past Surgical History:  Procedure Laterality Date  . ABOVE KNEE LEG AMPUTATION     right  . COLONOSCOPY WITH PROPOFOL N/A 05/18/2016   Procedure: COLONOSCOPY WITH PROPOFOL;  Surgeon: Milus Banister, MD;  Location: [facility];  Service: Endoscopy;  Laterality: N/A;  . ESOPHAGOGASTRODUODENOSCOPY (EGD) WITH PROPOFOL N/A 05/18/2016   Procedure: ESOPHAGOGASTRODUODENOSCOPY (EGD) WITH PROPOFOL;  Surgeon: Milus Banister, MD;  Location: [facility];  Service: Endoscopy;  Laterality: N/A;   . EYE SURGERY  09/2011   bilateral for retinopathy - laser eye surgery  . left metatarsal  2003   5th  . TOE AMPUTATION     Left big toe    Family History  Problem Relation Age of Onset  . Cancer Mother     unknown type    Social History:  reports that he has never smoked. He has never used smokeless tobacco. He reports that he does not drink alcohol or use drugs.  Allergies: No Known Allergies  Medications: I have reviewed the patient's current medications.  Results for orders placed or performed during the hospital encounter of 10/23/16 (from the past 48 hour(s))  Comprehensive metabolic panel     Status: Abnormal   Collection Time: 10/23/16 11:38 AM  Result Value Ref Range   Sodium 132 (L) 135 - 145 mmol/L   Potassium 5.4 (H) 3.5 - 5.1 mmol/L   Chloride 105 101 - 111 mmol/L   CO2 19 (L) 22 - 32 mmol/L   Glucose, Bld 157 (H) 65 - 99 mg/dL   BUN 39 (H) 6 - 20 mg/dL   Creatinine, Ser 2.49 (H) 0.61 - 1.24 mg/dL   Calcium 8.6 (L) 8.9 - 10.3 mg/dL   Total Protein 7.1 6.5 - 8.1 g/dL   Albumin 2.1 (L) 3.5 - 5.0 g/dL   AST 45 (H) 15 - 41 U/L   ALT 26 17 - 63 U/L   Alkaline Phosphatase 76  38 - 126 U/L   Total Bilirubin 3.2 (H) 0.3 - 1.2 mg/dL   GFR calc non Af Amer 25 (L) >60 mL/min   GFR calc Af Amer 29 (L) >60 mL/min    Comment: (NOTE) The eGFR has been calculated using the CKD EPI equation. This calculation has not been validated in all clinical situations. eGFR's persistently <60 mL/min signify possible Chronic Kidney Disease.    Anion gap 8 5 - 15  CBC with Differential     Status: Abnormal   Collection Time: 10/23/16 11:38 AM  Result Value Ref Range   WBC 9.5 4.0 - 10.5 K/uL   RBC 3.04 (L) 4.22 - 5.81 MIL/uL   Hemoglobin 10.5 (L) 13.0 - 17.0 g/dL   HCT 30.2 (L) 39.0 - 52.0 %   MCV 99.3 78.0 - 100.0 fL   MCH 34.5 (H) 26.0 - 34.0 pg   MCHC 34.8 30.0 - 36.0 g/dL   RDW 14.7 11.5 - 15.5 %   Platelets 115 (L) 150 - 400 K/uL    Comment: REPEATED TO VERIFY SPECIMEN  CHECKED FOR CLOTS PLATELET COUNT CONFIRMED BY SMEAR    Neutrophils Relative % 80 %   Neutro Abs 7.5 1.7 - 7.7 K/uL   Lymphocytes Relative 10 %   Lymphs Abs 0.9 0.7 - 4.0 K/uL   Monocytes Relative 7 %   Monocytes Absolute 0.7 0.1 - 1.0 K/uL   Eosinophils Relative 4 %   Eosinophils Absolute 0.4 0.0 - 0.7 K/uL   Basophils Relative 0 %   Basophils Absolute 0.0 0.0 - 0.1 K/uL  Protime-INR     Status: Abnormal   Collection Time: 10/23/16 11:38 AM  Result Value Ref Range   Prothrombin Time 22.6 (H) 11.4 - 15.2 seconds   INR 1.96   Culture, blood (Routine x 2)     Status: None (Preliminary result)   Collection Time: 10/23/16 11:40 AM  Result Value Ref Range   Specimen Description BLOOD RIGHT ANTECUBITAL    Special Requests      BOTTLES DRAWN AEROBIC AND ANAEROBIC 10CC AER 5CC ANA   Culture NO GROWTH 1 DAY    Report Status PENDING   Culture, blood (Routine x 2)     Status: None (Preliminary result)   Collection Time: 10/23/16 11:54 AM  Result Value Ref Range   Specimen Description BLOOD RIGHT HAND    Special Requests BOTTLES DRAWN AEROBIC ONLY 10CC    Culture NO GROWTH 1 DAY    Report Status PENDING   Type and screen [place]     Status: None   Collection Time: 10/23/16 12:00 PM  Result Value Ref Range   ABO/RH(D) A POS    Antibody Screen NEG    Sample Expiration 10/26/2016   ABO/Rh     Status: None   Collection Time: 10/23/16 12:00 PM  Result Value Ref Range   ABO/RH(D) A POS   POC occult blood, ED     Status: Abnormal   Collection Time: 10/23/16 12:07 PM  Result Value Ref Range   Fecal Occult Bld POSITIVE (A) NEGATIVE  I-Stat CG4 Lactic Acid, ED     Status: Abnormal   Collection Time: 10/23/16 12:17 PM  Result Value Ref Range   Lactic Acid, Venous 2.77 (HH) 0.5 - 1.9 mmol/L   Comment NOTIFIED PHYSICIAN   Ammonia     Status: None   Collection Time: 10/23/16 12:41 PM  Result Value Ref Range   Ammonia 18 9 - 35 umol/L  I-Stat CG4 Lactic Acid, ED      Status: None   Collection Time: 10/23/16  4:30 PM  Result Value Ref Range   Lactic Acid, Venous 1.57 0.5 - 1.9 mmol/L  C difficile quick scan w PCR reflex     Status: None   Collection Time: 10/23/16  5:47 PM  Result Value Ref Range   C Diff antigen NEGATIVE NEGATIVE   C Diff toxin NEGATIVE NEGATIVE   C Diff interpretation No C. difficile detected.   Comprehensive metabolic panel     Status: Abnormal   Collection Time: 10/23/16 11:40 PM  Result Value Ref Range   Sodium 132 (L) 135 - 145 mmol/L   Potassium 4.9 3.5 - 5.1 mmol/L   Chloride 106 101 - 111 mmol/L   CO2 18 (L) 22 - 32 mmol/L   Glucose, Bld 105 (H) 65 - 99 mg/dL   BUN 39 (H) 6 - 20 mg/dL   Creatinine, Ser 2.16 (H) 0.61 - 1.24 mg/dL   Calcium 8.1 (L) 8.9 - 10.3 mg/dL   Total Protein 6.6 6.5 - 8.1 g/dL   Albumin 1.9 (L) 3.5 - 5.0 g/dL   AST 42 (H) 15 - 41 U/L   ALT 24 17 - 63 U/L   Alkaline Phosphatase 62 38 - 126 U/L   Total Bilirubin 2.5 (H) 0.3 - 1.2 mg/dL   GFR calc non Af Amer 30 (L) >60 mL/min   GFR calc Af Amer 35 (L) >60 mL/min    Comment: (NOTE) The eGFR has been calculated using the CKD EPI equation. This calculation has not been validated in all clinical situations. eGFR's persistently <60 mL/min signify possible Chronic Kidney Disease.    Anion gap 8 5 - 15  CBC     Status: Abnormal   Collection Time: 10/23/16 11:40 PM  Result Value Ref Range   WBC 7.7 4.0 - 10.5 K/uL   RBC 2.94 (L) 4.22 - 5.81 MIL/uL   Hemoglobin 10.1 (L) 13.0 - 17.0 g/dL   HCT 29.1 (L) 39.0 - 52.0 %   MCV 99.0 78.0 - 100.0 fL   MCH 34.4 (H) 26.0 - 34.0 pg   MCHC 34.7 30.0 - 36.0 g/dL   RDW 14.6 11.5 - 15.5 %   Platelets 92 (L) 150 - 400 K/uL    Comment: CONSISTENT WITH PREVIOUS RESULT  Urinalysis, Routine w reflex microscopic     Status: Abnormal   Collection Time: 10/24/16  4:56 AM  Result Value Ref Range   Color, Urine AMBER (A) YELLOW    Comment: BIOCHEMICALS MAY BE AFFECTED BY COLOR   APPearance HAZY (A) CLEAR   Specific  Gravity, Urine 1.017 1.005 - 1.030   pH 5.0 5.0 - 8.0   Glucose, UA NEGATIVE NEGATIVE mg/dL   Hgb urine dipstick NEGATIVE NEGATIVE   Bilirubin Urine NEGATIVE NEGATIVE   Ketones, ur NEGATIVE NEGATIVE mg/dL   Protein, ur NEGATIVE NEGATIVE mg/dL   Nitrite NEGATIVE NEGATIVE   Leukocytes, UA NEGATIVE NEGATIVE  Lactate dehydrogenase (CSF, pleural or peritoneal fluid)     Status: Abnormal   Collection Time: 10/24/16 11:10 AM  Result Value Ref Range   LD, Fluid 58 (H) 3 - 23 U/L    Comment: (NOTE) Results should be evaluated in conjunction with serum values    Fluid Type-FLDH Peritoneal   Body fluid cell count with differential     Status: Abnormal   Collection Time: 10/24/16 11:10 AM  Result Value Ref Range   Fluid Type-FCT Peritoneal  Color, Fluid YELLOW (A) YELLOW   Appearance, Fluid HAZY (A) CLEAR   WBC, Fluid 205 0 - 1,000 cu mm   Neutrophil Count, Fluid 14 0 - 25 %   Lymphs, Fluid 12 %   Monocyte-Macrophage-Serous Fluid 74 50 - 90 %   Other Cells, Fluid MESOTHELIAL CELLS %  Culture, body fluid-bottle     Status: None (Preliminary result)   Collection Time: 10/24/16 11:10 AM  Result Value Ref Range   Specimen Description PERITONEAL    Special Requests NONE    Culture NO GROWTH < 12 HOURS    Report Status PENDING   Gram stain     Status: None   Collection Time: 10/24/16 11:10 AM  Result Value Ref Range   Specimen Description PERITONEAL    Special Requests NONE    Gram Stain      RARE WBC PRESENT,BOTH PMN AND MONONUCLEAR NO ORGANISMS SEEN    Report Status 10/24/2016 FINAL   Hemoglobin and hematocrit, blood     Status: Abnormal   Collection Time: 10/24/16 12:43 PM  Result Value Ref Range   Hemoglobin 11.2 (L) 13.0 - 17.0 g/dL   HCT 32.8 (L) 39.0 - 52.0 %  MRSA PCR Screening     Status: None   Collection Time: 10/24/16  5:12 PM  Result Value Ref Range   MRSA by PCR NEGATIVE NEGATIVE    Comment:        The GeneXpert MRSA Assay (FDA approved for NASAL  specimens only), is one component of a comprehensive MRSA colonization surveillance program. It is not intended to diagnose MRSA infection nor to guide or monitor treatment for MRSA infections.   Hemoglobin A1c     Status: Abnormal   Collection Time: 10/24/16  7:01 PM  Result Value Ref Range   Hgb A1c MFr Bld 5.8 (H) 4.8 - 5.6 %    Comment: (NOTE)         Pre-diabetes: 5.7 - 6.4         Diabetes: >6.4         Glycemic control for adults with diabetes: <7.0    Mean Plasma Glucose 120 mg/dL    Comment: (NOTE) Performed At: [facility] [place], [place] [id number] Lindon Romp MD EX:[id number]   Lipid panel     Status: Abnormal   Collection Time: 10/24/16  7:01 PM  Result Value Ref Range   Cholesterol 51 0 - 200 mg/dL   Triglycerides 48 <150 mg/dL   HDL 13 (L) >40 mg/dL   Total CHOL/HDL Ratio 3.9 RATIO   VLDL 10 0 - 40 mg/dL   LDL Cholesterol 28 0 - 99 mg/dL    Comment:        Total Cholesterol/HDL:CHD Risk Coronary Heart Disease Risk Table                     Men   Women  1/2 Average Risk   3.4   3.3  Average Risk       5.0   4.4  2 X Average Risk   9.6   7.1  3 X Average Risk  23.4   11.0        Use the calculated Patient Ratio above and the CHD Risk Table to determine the patient's CHD Risk.        ATP III CLASSIFICATION (LDL):  <100     mg/dL   Optimal  100-129  mg/dL   Near or Above  Optimal  130-159  mg/dL   Borderline  160-189  mg/dL   High  >190     mg/dL   Very High   Save smear     Status: None   Collection Time: 10/24/16  7:01 PM  Result Value Ref Range   Smear Review SMEAR STAINED AND AVAILABLE FOR REVIEW   Glucose, capillary     Status: Abnormal   Collection Time: 10/24/16  7:47 PM  Result Value Ref Range   Glucose-Capillary 109 (H) 65 - 99 mg/dL  Glucose, capillary     Status: Abnormal   Collection Time: 10/24/16 11:43 PM  Result Value Ref Range   Glucose-Capillary 128 (H) 65 - 99 mg/dL  Glucose,  capillary     Status: Abnormal   Collection Time: 10/25/16  3:40 AM  Result Value Ref Range   Glucose-Capillary 105 (H) 65 - 99 mg/dL    Ct Abdomen Pelvis Wo Contrast  Result Date: 10/23/2016 CLINICAL DATA:  Abdominal pain with diarrhea for 2-3 days EXAM: CT ABDOMEN AND PELVIS WITHOUT CONTRAST TECHNIQUE: Multidetector CT imaging of the abdomen and pelvis was performed following the standard protocol without IV contrast. COMPARISON:  None. FINDINGS: Lower chest: No acute abnormality. Hepatobiliary: Liver is diminutive in size with a nodular contour consistent with cirrhosis. Large volume abdominal ascites. Cholelithiasis without biliary dilatation. Pancreas: Unremarkable. No pancreatic ductal dilatation or surrounding inflammatory changes. Spleen: Normal in size without focal abnormality. Adrenals/Urinary Tract: Normal adrenal glands. No urolithiasis with or obstructive uropathy. 1.7 x 1.9 cm hypodense, fluid attenuating right interpolar renal mass most consistent with a cyst. 5.4 x 4.8 cm hypodense, fluid attenuating left renal mass most consistent with a cyst. 13 x 9 mm hyperdense exophytic left posterior interpolar renal mass likely reflecting a hemorrhagic or proteinaceous cyst but is technically indeterminate. Normal bladder. Stomach/Bowel: No bowel dilatation. Mild relative bowel wall thickening throughout the colon which may reflect reactive edema secondary to ascites versus mild colitis. No pneumatosis, pneumoperitoneum or portal venous gas. Normal appendix. Vascular/Lymphatic: Normal caliber abdominal aorta with atherosclerosis. No lymphadenopathy. Reproductive: Prostate is unremarkable. Other: No other fluid collection or hematoma. Musculoskeletal: No lytic or sclerotic osseous lesion. Diffuse thoracolumbar spine spondylosis. IMPRESSION: 1. Cirrhosis with large volume ascites. 2. Mild relative bowel wall thickening throughout the colon which may reflect reactive edema secondary to ascites versus mild  colitis. 3. Cholelithiasis. 4. Bilateral renal cysts. Electronically Signed   By: Kathreen Devoid   On: 10/23/2016 21:14   Mr Foot Left Wo Contrast  Result Date: 10/24/2016 CLINICAL DATA:  Nonhealing ulcer at the ball of the foot below were the great toe use to be. Query osteomyelitis. EXAM: MRI OF THE LEFT FOOT WITHOUT CONTRAST TECHNIQUE: Multiplanar, multisequence MR imaging of the left forefoot was performed. No intravenous contrast was administered. COMPARISON:  CT left lower extremity 04/06/2010 FINDINGS: Bones/Joint/Cartilage Previous amputation of the great toe at the base of the proximal phalanx. Somewhat irregular appearance of the first metatarsal head with increased marrow signal intensity in the distal first metatarsal head as well as in the remaining proximal phalangeal stump. Focal subperiosteal signal intensity along the metatarsal hand. Changes suggest focal osteomyelitis. Scattered marrow signal intensity changes in the intertarsal bones probably represent degenerative change. Hammertoe deformities. Muscles and Tendons Visualized flexor and extensor tendons appear intact with normal signal intensity. Edema in the plantar musculature of the forefoot suggesting inflammatory change. Soft tissues Subcutaneous soft tissue edema around the first metatarsal head and stump with focal ulceration inferior and lateral  to the metatarsal head suggesting cellulitis. No loculated fluid collection to suggest abscess. IMPRESSION: Previous amputation of the right first toe. Soft tissue edema consistent with cellulitis and ulceration at the stomach and over the first metatarsal head. Edema in the plantar musculature. Focal bone erosion and marrow signal changes at the distal first metatarsal head and involving the proximal phalangeal stump likely indicates focal osteomyelitis. Electronically Signed   By: Lucienne Capers M.D.   On: 10/24/2016 06:28   US Paracentesis  Result Date: 10/24/2016 INDICATION: New onset  abdominal pain with new findings of ascites. Request is made for diagnostic and therapeutic paracentesis. EXAM: ULTRASOUND GUIDED DIAGNOSTIC AND THERAPEUTIC PARACENTESIS MEDICATIONS: 1% lidocaine. COMPLICATIONS: None immediate. PROCEDURE: Informed written consent was obtained from the patient after a discussion of the risks, benefits and alternatives to treatment. A timeout was performed prior to the initiation of the procedure. Initial ultrasound scanning demonstrates a small amount of ascites within the right lower abdominal quadrant. The right lower abdomen was prepped and draped in the usual sterile fashion. 1% lidocaine was used for local anesthesia. Following this, a 19 gauge, 7-cm, Yueh catheter was introduced. An ultrasound image was saved for documentation purposes. The paracentesis was performed. The catheter was removed and a dressing was applied. The patient tolerated the procedure well without immediate post procedural complication. FINDINGS: A total of approximately 3.3 L of yellow fluid was removed. Samples were sent to the laboratory as requested by the clinical team. IMPRESSION: Successful ultrasound-guided paracentesis yielding 3.3 liters of peritoneal fluid. Read by: Saverio Danker, PA-C Electronically Signed   By: Sandi Mariscal M.D.   On: 10/24/2016 11:23   Dg Foot 2 Views Left  Result Date: 10/24/2016 CLINICAL DATA:  Osteomyelitis EXAM: LEFT FOOT - 2 VIEW COMPARISON:  MRI 10/24/2016 FINDINGS: Changes of prior amputation of the left great toe. Lucency/irregularity within the distal aspect of the left first metatarsal may reflect area of bone destruction and osteomyelitis. No additional acute bony abnormality. Prior internal fixation of an old healed fifth metatarsal fracture. IMPRESSION: Lucency in the distal left first metatarsal, possibly area of osteomyelitis. Electronically Signed   By: Rolm Baptise M.D.   On: 10/24/2016 17:44    Review of Systems  Eyes:       Previous retinal surgery  laser eye surgery.  Respiratory: Positive for cough. Negative for hemoptysis.   Gastrointestinal:       Positive for cirrhosis and ascites. Past history of cholelithiasis.  Genitourinary:       Positive for renal disease  Musculoskeletal:       Previous infected foot ulcer right foot ultimate BKA for sepsis and then later AKA with ongoing infection. Right AK stump is healed. He's not been able to be a prosthetic wear due to skin irritation is tried multiple different liners.  Neurological:       Positive for diabetic neuropathy.  Endo/Heme/Allergies:       Diabetes with the previous amputation right lower extremity above-knee amputation. Ulcer left first metatarsal head medially for many weeks. He been on doxycycline recently by Dr. Kevan Ny follows his diabetes.  Psychiatric/Behavioral: Negative for hallucinations and memory loss. The patient is not nervous/anxious.    Blood pressure 111/67, pulse 64, temperature 97.4 F (36.3 C), temperature source Oral, resp. rate (!) 25, SpO2 97 %. Physical Exam  Constitutional: He appears well-developed and well-nourished.  Eyes: EOM are normal.  Glasses  Neck: Normal range of motion.  Respiratory: Effort normal.  GI: He exhibits distension.  Positive for ascites.  Musculoskeletal:  Right AKA well-healed. Left the grade 4 first metatarsal ulcer size of a $0.50 piece. Purulent drainage.  Neurological: He is alert.  Peripheral neuropathy left foot.  Skin: Skin is warm.  Grade 4 left first metatarsal head ulcer probes to bone  Psychiatric: He has a normal mood and affect. His behavior is normal. Thought content normal.    Assessment/Plan: Long discussion about the treatment plans. He has liver disease, renal disease, diabetes. 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 There is osteomyelitis involving the first metatarsal head. We'll proceed with first ray amputation. He may likely require a VAC placed to assist with healing.  His is at increased risk due to his multiorgan system abnormalities for surgery. He understands that without debridement of the infected bone this does not have a chance at healing. ABIs were reviewed. He has some noncompressible posterior tibial vessel. Capillary refill is adequate. He understands and drop cultures will be obtained and will take a couple days for antibiotics to be properly adjusted based on culture results. Risks of nonhealing discussed. 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 Questions were elicited and answered. Surgery scheduled later today.  Marybelle Killings 10/25/2016, 7:18 AM

## 2016-10-25 NOTE — Brief Op Note (Signed)
10/23/2016 - 10/25/2016  5:49 PM  PATIENT:  Jeffery FallenKenneth Delaney  66 y.o. male  PRE-OPERATIVE DIAGNOSIS:  Left Foot Osteomyelitis Great Toe  POST-OPERATIVE DIAGNOSIS:  Left Foot Osteomyelitis Great Toe  PROCEDURE:  Procedure(s): LEFT FOOT DEBRIDEMENT RAY AMPUTATION PLACEMENT OF WOUND VAC (Left)  SURGEON:  Surgeon(s) and Role:    * Eldred MangesMark C Lemya Greenwell, MD - Primary  PHYSICIAN ASSISTANT:   ASSISTANTS: none   ANESTHESIA:   general  EBL:  Total I/O In: 1829.6 [P.O.:360; I.V.:1169.6; IV Piggyback:300] Out: 220 [Urine:200; Blood:20]  BLOOD ADMINISTERED:none  DRAINS: none   Incision VAC placed  LOCAL MEDICATIONS USED:  NONE  SPECIMEN:  Source of Specimen:  ray amputation 1 st MT   DISPOSITION OF SPECIMEN:  PATHOLOGY  COUNTS:  YES  TOURNIQUET:   Total Tourniquet Time Documented: Calf (Left) - 9 minutes Total: Calf (Left) - 9 minutes   DICTATION: .Reubin Milanragon Dictation  PLAN OF CARE: already IP  PATIENT DISPOSITION:  PACU - hemodynamically stable.   Delay start of Pharmacological VTE agent (>24hrs) due to surgical blood loss or risk of bleeding: yes

## 2016-10-25 NOTE — H&P (View-Only) (Signed)
Reason for Consult:left foot Wagner grade 4 ulcer with first Metatarsal head osteomyelitis Referring Physician: Sheral Flow MD  Jeffery [name] is an 66 y.o. male.  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 Study show osteomyelitis by plain radiograph as well as MRI of the first metatarsal head with sesamoid involvement. 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 Patient's had previous great toe amputation in the past at same time when he presented originally with undiagnosed diabetes with an ulcer on his right foot. He originally had a BKA on the right and then had a revision to above-knee amputation due to ongoing infection and sepsis. Patient has renal disease. He also has cirrhosis and denies alcohol abuse. No history of hepatitis. Does have pes planus past history of cholelithiasis. A1c is 5.8. Patient working on his computer on the class work.  Past Medical History:  Diagnosis Date  . Arthritis   . Asthma   . Bilateral cataracts   . Blurred vision    improved now  . Cholelithiasis   . Diabetes mellitus    type II  . Eczema    inner wrist  . Hypertension   . Muscle cramps   . Necrotizing fasciitis ([place])   . Neuropathy ([place])   . Rash    wrist and hands since late june  . Retinal degeneration     Past Surgical History:  Procedure Laterality Date  . ABOVE KNEE LEG AMPUTATION     right  . COLONOSCOPY WITH PROPOFOL N/A 05/18/2016   Procedure: COLONOSCOPY WITH PROPOFOL;  Surgeon: Milus Banister, MD;  Location: [facility];  Service: Endoscopy;  Laterality: N/A;  . ESOPHAGOGASTRODUODENOSCOPY (EGD) WITH PROPOFOL N/A 05/18/2016   Procedure: ESOPHAGOGASTRODUODENOSCOPY (EGD) WITH PROPOFOL;  Surgeon: Milus Banister, MD;  Location: [facility];  Service: Endoscopy;  Laterality: N/A;   . EYE SURGERY  09/2011   bilateral for retinopathy - laser eye surgery  . left metatarsal  2003   5th  . TOE AMPUTATION     Left big toe    Family History  Problem Relation Age of Onset  . Cancer Mother     unknown type    Social History:  reports that he has never smoked. He has never used smokeless tobacco. He reports that he does not drink alcohol or use drugs.  Allergies: No Known Allergies  Medications: I have reviewed the patient's current medications.  Results for orders placed or performed during the hospital encounter of 10/23/16 (from the past 48 hour(s))  Comprehensive metabolic panel     Status: Abnormal   Collection Time: 10/23/16 11:38 AM  Result Value Ref Range   Sodium 132 (L) 135 - 145 mmol/L   Potassium 5.4 (H) 3.5 - 5.1 mmol/L   Chloride 105 101 - 111 mmol/L   CO2 19 (L) 22 - 32 mmol/L   Glucose, Bld 157 (H) 65 - 99 mg/dL   BUN 39 (H) 6 - 20 mg/dL   Creatinine, Ser 2.49 (H) 0.61 - 1.24 mg/dL   Calcium 8.6 (L) 8.9 - 10.3 mg/dL   Total Protein 7.1 6.5 - 8.1 g/dL   Albumin 2.1 (L) 3.5 - 5.0 g/dL   AST 45 (H) 15 - 41 U/L   ALT 26 17 - 63 U/L   Alkaline Phosphatase 76  38 - 126 U/L   Total Bilirubin 3.2 (H) 0.3 - 1.2 mg/dL   GFR calc non Af Amer 25 (L) >60 mL/min   GFR calc Af Amer 29 (L) >60 mL/min    Comment: (NOTE) The eGFR has been calculated using the CKD EPI equation. This calculation has not been validated in all clinical situations. eGFR's persistently <60 mL/min signify possible Chronic Kidney Disease.    Anion gap 8 5 - 15  CBC with Differential     Status: Abnormal   Collection Time: 10/23/16 11:38 AM  Result Value Ref Range   WBC 9.5 4.0 - 10.5 K/uL   RBC 3.04 (L) 4.22 - 5.81 MIL/uL   Hemoglobin 10.5 (L) 13.0 - 17.0 g/dL   HCT 30.2 (L) 39.0 - 52.0 %   MCV 99.3 78.0 - 100.0 fL   MCH 34.5 (H) 26.0 - 34.0 pg   MCHC 34.8 30.0 - 36.0 g/dL   RDW 14.7 11.5 - 15.5 %   Platelets 115 (L) 150 - 400 K/uL    Comment: REPEATED TO VERIFY SPECIMEN  CHECKED FOR CLOTS PLATELET COUNT CONFIRMED BY SMEAR    Neutrophils Relative % 80 %   Neutro Abs 7.5 1.7 - 7.7 K/uL   Lymphocytes Relative 10 %   Lymphs Abs 0.9 0.7 - 4.0 K/uL   Monocytes Relative 7 %   Monocytes Absolute 0.7 0.1 - 1.0 K/uL   Eosinophils Relative 4 %   Eosinophils Absolute 0.4 0.0 - 0.7 K/uL   Basophils Relative 0 %   Basophils Absolute 0.0 0.0 - 0.1 K/uL  Protime-INR     Status: Abnormal   Collection Time: 10/23/16 11:38 AM  Result Value Ref Range   Prothrombin Time 22.6 (H) 11.4 - 15.2 seconds   INR 1.96   Culture, blood (Routine x 2)     Status: None (Preliminary result)   Collection Time: 10/23/16 11:40 AM  Result Value Ref Range   Specimen Description BLOOD RIGHT ANTECUBITAL    Special Requests      BOTTLES DRAWN AEROBIC AND ANAEROBIC 10CC AER 5CC ANA   Culture NO GROWTH 1 DAY    Report Status PENDING   Culture, blood (Routine x 2)     Status: None (Preliminary result)   Collection Time: 10/23/16 11:54 AM  Result Value Ref Range   Specimen Description BLOOD RIGHT HAND    Special Requests BOTTLES DRAWN AEROBIC ONLY 10CC    Culture NO GROWTH 1 DAY    Report Status PENDING   Type and screen [place]     Status: None   Collection Time: 10/23/16 12:00 PM  Result Value Ref Range   ABO/RH(D) A POS    Antibody Screen NEG    Sample Expiration 10/26/2016   ABO/Rh     Status: None   Collection Time: 10/23/16 12:00 PM  Result Value Ref Range   ABO/RH(D) A POS   POC occult blood, ED     Status: Abnormal   Collection Time: 10/23/16 12:07 PM  Result Value Ref Range   Fecal Occult Bld POSITIVE (A) NEGATIVE  I-Stat CG4 Lactic Acid, ED     Status: Abnormal   Collection Time: 10/23/16 12:17 PM  Result Value Ref Range   Lactic Acid, Venous 2.77 (HH) 0.5 - 1.9 mmol/L   Comment NOTIFIED PHYSICIAN   Ammonia     Status: None   Collection Time: 10/23/16 12:41 PM  Result Value Ref Range   Ammonia 18 9 - 35 umol/L  I-Stat CG4 Lactic Acid, ED      Status: None   Collection Time: 10/23/16  4:30 PM  Result Value Ref Range   Lactic Acid, Venous 1.57 0.5 - 1.9 mmol/L  C difficile quick scan w PCR reflex     Status: None   Collection Time: 10/23/16  5:47 PM  Result Value Ref Range   C Diff antigen NEGATIVE NEGATIVE   C Diff toxin NEGATIVE NEGATIVE   C Diff interpretation No C. difficile detected.   Comprehensive metabolic panel     Status: Abnormal   Collection Time: 10/23/16 11:40 PM  Result Value Ref Range   Sodium 132 (L) 135 - 145 mmol/L   Potassium 4.9 3.5 - 5.1 mmol/L   Chloride 106 101 - 111 mmol/L   CO2 18 (L) 22 - 32 mmol/L   Glucose, Bld 105 (H) 65 - 99 mg/dL   BUN 39 (H) 6 - 20 mg/dL   Creatinine, Ser 2.16 (H) 0.61 - 1.24 mg/dL   Calcium 8.1 (L) 8.9 - 10.3 mg/dL   Total Protein 6.6 6.5 - 8.1 g/dL   Albumin 1.9 (L) 3.5 - 5.0 g/dL   AST 42 (H) 15 - 41 U/L   ALT 24 17 - 63 U/L   Alkaline Phosphatase 62 38 - 126 U/L   Total Bilirubin 2.5 (H) 0.3 - 1.2 mg/dL   GFR calc non Af Amer 30 (L) >60 mL/min   GFR calc Af Amer 35 (L) >60 mL/min    Comment: (NOTE) The eGFR has been calculated using the CKD EPI equation. This calculation has not been validated in all clinical situations. eGFR's persistently <60 mL/min signify possible Chronic Kidney Disease.    Anion gap 8 5 - 15  CBC     Status: Abnormal   Collection Time: 10/23/16 11:40 PM  Result Value Ref Range   WBC 7.7 4.0 - 10.5 K/uL   RBC 2.94 (L) 4.22 - 5.81 MIL/uL   Hemoglobin 10.1 (L) 13.0 - 17.0 g/dL   HCT 29.1 (L) 39.0 - 52.0 %   MCV 99.0 78.0 - 100.0 fL   MCH 34.4 (H) 26.0 - 34.0 pg   MCHC 34.7 30.0 - 36.0 g/dL   RDW 14.6 11.5 - 15.5 %   Platelets 92 (L) 150 - 400 K/uL    Comment: CONSISTENT WITH PREVIOUS RESULT  Urinalysis, Routine w reflex microscopic     Status: Abnormal   Collection Time: 10/24/16  4:56 AM  Result Value Ref Range   Color, Urine AMBER (A) YELLOW    Comment: BIOCHEMICALS MAY BE AFFECTED BY COLOR   APPearance HAZY (A) CLEAR   Specific  Gravity, Urine 1.017 1.005 - 1.030   pH 5.0 5.0 - 8.0   Glucose, UA NEGATIVE NEGATIVE mg/dL   Hgb urine dipstick NEGATIVE NEGATIVE   Bilirubin Urine NEGATIVE NEGATIVE   Ketones, ur NEGATIVE NEGATIVE mg/dL   Protein, ur NEGATIVE NEGATIVE mg/dL   Nitrite NEGATIVE NEGATIVE   Leukocytes, UA NEGATIVE NEGATIVE  Lactate dehydrogenase (CSF, pleural or peritoneal fluid)     Status: Abnormal   Collection Time: 10/24/16 11:10 AM  Result Value Ref Range   LD, Fluid 58 (H) 3 - 23 U/L    Comment: (NOTE) Results should be evaluated in conjunction with serum values    Fluid Type-FLDH Peritoneal   Body fluid cell count with differential     Status: Abnormal   Collection Time: 10/24/16 11:10 AM  Result Value Ref Range   Fluid Type-FCT Peritoneal  Color, Fluid YELLOW (A) YELLOW   Appearance, Fluid HAZY (A) CLEAR   WBC, Fluid 205 0 - 1,000 cu mm   Neutrophil Count, Fluid 14 0 - 25 %   Lymphs, Fluid 12 %   Monocyte-Macrophage-Serous Fluid 74 50 - 90 %   Other Cells, Fluid MESOTHELIAL CELLS %  Culture, body fluid-bottle     Status: None (Preliminary result)   Collection Time: 10/24/16 11:10 AM  Result Value Ref Range   Specimen Description PERITONEAL    Special Requests NONE    Culture NO GROWTH < 12 HOURS    Report Status PENDING   Gram stain     Status: None   Collection Time: 10/24/16 11:10 AM  Result Value Ref Range   Specimen Description PERITONEAL    Special Requests NONE    Gram Stain      RARE WBC PRESENT,BOTH PMN AND MONONUCLEAR NO ORGANISMS SEEN    Report Status 10/24/2016 FINAL   Hemoglobin and hematocrit, blood     Status: Abnormal   Collection Time: 10/24/16 12:43 PM  Result Value Ref Range   Hemoglobin 11.2 (L) 13.0 - 17.0 g/dL   HCT 32.8 (L) 39.0 - 52.0 %  MRSA PCR Screening     Status: None   Collection Time: 10/24/16  5:12 PM  Result Value Ref Range   MRSA by PCR NEGATIVE NEGATIVE    Comment:        The GeneXpert MRSA Assay (FDA approved for NASAL  specimens only), is one component of a comprehensive MRSA colonization surveillance program. It is not intended to diagnose MRSA infection nor to guide or monitor treatment for MRSA infections.   Hemoglobin A1c     Status: Abnormal   Collection Time: 10/24/16  7:01 PM  Result Value Ref Range   Hgb A1c MFr Bld 5.8 (H) 4.8 - 5.6 %    Comment: (NOTE)         Pre-diabetes: 5.7 - 6.4         Diabetes: >6.4         Glycemic control for adults with diabetes: <7.0    Mean Plasma Glucose 120 mg/dL    Comment: (NOTE) Performed At: [facility] [place], [place] [id number] Lindon Romp MD OX:[id number]   Lipid panel     Status: Abnormal   Collection Time: 10/24/16  7:01 PM  Result Value Ref Range   Cholesterol 51 0 - 200 mg/dL   Triglycerides 48 <150 mg/dL   HDL 13 (L) >40 mg/dL   Total CHOL/HDL Ratio 3.9 RATIO   VLDL 10 0 - 40 mg/dL   LDL Cholesterol 28 0 - 99 mg/dL    Comment:        Total Cholesterol/HDL:CHD Risk Coronary Heart Disease Risk Table                     Men   Women  1/2 Average Risk   3.4   3.3  Average Risk       5.0   4.4  2 X Average Risk   9.6   7.1  3 X Average Risk  23.4   11.0        Use the calculated Patient Ratio above and the CHD Risk Table to determine the patient's CHD Risk.        ATP III CLASSIFICATION (LDL):  <100     mg/dL   Optimal  100-129  mg/dL   Near or Above  Optimal  130-159  mg/dL   Borderline  160-189  mg/dL   High  >190     mg/dL   Very High   Save smear     Status: None   Collection Time: 10/24/16  7:01 PM  Result Value Ref Range   Smear Review SMEAR STAINED AND AVAILABLE FOR REVIEW   Glucose, capillary     Status: Abnormal   Collection Time: 10/24/16  7:47 PM  Result Value Ref Range   Glucose-Capillary 109 (H) 65 - 99 mg/dL  Glucose, capillary     Status: Abnormal   Collection Time: 10/24/16 11:43 PM  Result Value Ref Range   Glucose-Capillary 128 (H) 65 - 99 mg/dL  Glucose,  capillary     Status: Abnormal   Collection Time: 10/25/16  3:40 AM  Result Value Ref Range   Glucose-Capillary 105 (H) 65 - 99 mg/dL    Ct Abdomen Pelvis Wo Contrast  Result Date: 10/23/2016 CLINICAL DATA:  Abdominal pain with diarrhea for 2-3 days EXAM: CT ABDOMEN AND PELVIS WITHOUT CONTRAST TECHNIQUE: Multidetector CT imaging of the abdomen and pelvis was performed following the standard protocol without IV contrast. COMPARISON:  None. FINDINGS: Lower chest: No acute abnormality. Hepatobiliary: Liver is diminutive in size with a nodular contour consistent with cirrhosis. Large volume abdominal ascites. Cholelithiasis without biliary dilatation. Pancreas: Unremarkable. No pancreatic ductal dilatation or surrounding inflammatory changes. Spleen: Normal in size without focal abnormality. Adrenals/Urinary Tract: Normal adrenal glands. No urolithiasis with or obstructive uropathy. 1.7 x 1.9 cm hypodense, fluid attenuating right interpolar renal mass most consistent with a cyst. 5.4 x 4.8 cm hypodense, fluid attenuating left renal mass most consistent with a cyst. 13 x 9 mm hyperdense exophytic left posterior interpolar renal mass likely reflecting a hemorrhagic or proteinaceous cyst but is technically indeterminate. Normal bladder. Stomach/Bowel: No bowel dilatation. Mild relative bowel wall thickening throughout the colon which may reflect reactive edema secondary to ascites versus mild colitis. No pneumatosis, pneumoperitoneum or portal venous gas. Normal appendix. Vascular/Lymphatic: Normal caliber abdominal aorta with atherosclerosis. No lymphadenopathy. Reproductive: Prostate is unremarkable. Other: No other fluid collection or hematoma. Musculoskeletal: No lytic or sclerotic osseous lesion. Diffuse thoracolumbar spine spondylosis. IMPRESSION: 1. Cirrhosis with large volume ascites. 2. Mild relative bowel wall thickening throughout the colon which may reflect reactive edema secondary to ascites versus mild  colitis. 3. Cholelithiasis. 4. Bilateral renal cysts. Electronically Signed   By: Kathreen Devoid   On: 10/23/2016 21:14   Mr Foot Left Wo Contrast  Result Date: 10/24/2016 CLINICAL DATA:  Nonhealing ulcer at the ball of the foot below were the great toe use to be. Query osteomyelitis. EXAM: MRI OF THE LEFT FOOT WITHOUT CONTRAST TECHNIQUE: Multiplanar, multisequence MR imaging of the left forefoot was performed. No intravenous contrast was administered. COMPARISON:  CT left lower extremity 04/06/2010 FINDINGS: Bones/Joint/Cartilage Previous amputation of the great toe at the base of the proximal phalanx. Somewhat irregular appearance of the first metatarsal head with increased marrow signal intensity in the distal first metatarsal head as well as in the remaining proximal phalangeal stump. Focal subperiosteal signal intensity along the metatarsal hand. Changes suggest focal osteomyelitis. Scattered marrow signal intensity changes in the intertarsal bones probably represent degenerative change. Hammertoe deformities. Muscles and Tendons Visualized flexor and extensor tendons appear intact with normal signal intensity. Edema in the plantar musculature of the forefoot suggesting inflammatory change. Soft tissues Subcutaneous soft tissue edema around the first metatarsal head and stump with focal ulceration inferior and lateral  to the metatarsal head suggesting cellulitis. No loculated fluid collection to suggest abscess. IMPRESSION: Previous amputation of the right first toe. Soft tissue edema consistent with cellulitis and ulceration at the stomach and over the first metatarsal head. Edema in the plantar musculature. Focal bone erosion and marrow signal changes at the distal first metatarsal head and involving the proximal phalangeal stump likely indicates focal osteomyelitis. Electronically Signed   By: Lucienne Capers M.D.   On: 10/24/2016 06:28   US Paracentesis  Result Date: 10/24/2016 INDICATION: New onset  abdominal pain with new findings of ascites. Request is made for diagnostic and therapeutic paracentesis. EXAM: ULTRASOUND GUIDED DIAGNOSTIC AND THERAPEUTIC PARACENTESIS MEDICATIONS: 1% lidocaine. COMPLICATIONS: None immediate. PROCEDURE: Informed written consent was obtained from the patient after a discussion of the risks, benefits and alternatives to treatment. A timeout was performed prior to the initiation of the procedure. Initial ultrasound scanning demonstrates a small amount of ascites within the right lower abdominal quadrant. The right lower abdomen was prepped and draped in the usual sterile fashion. 1% lidocaine was used for local anesthesia. Following this, a 19 gauge, 7-cm, Yueh catheter was introduced. An ultrasound image was saved for documentation purposes. The paracentesis was performed. The catheter was removed and a dressing was applied. The patient tolerated the procedure well without immediate post procedural complication. FINDINGS: A total of approximately 3.3 L of yellow fluid was removed. Samples were sent to the laboratory as requested by the clinical team. IMPRESSION: Successful ultrasound-guided paracentesis yielding 3.3 liters of peritoneal fluid. Read by: Saverio Danker, PA-C Electronically Signed   By: Sandi Mariscal M.D.   On: 10/24/2016 11:23   Dg Foot 2 Views Left  Result Date: 10/24/2016 CLINICAL DATA:  Osteomyelitis EXAM: LEFT FOOT - 2 VIEW COMPARISON:  MRI 10/24/2016 FINDINGS: Changes of prior amputation of the left great toe. Lucency/irregularity within the distal aspect of the left first metatarsal may reflect area of bone destruction and osteomyelitis. No additional acute bony abnormality. Prior internal fixation of an old healed fifth metatarsal fracture. IMPRESSION: Lucency in the distal left first metatarsal, possibly area of osteomyelitis. Electronically Signed   By: Rolm Baptise M.D.   On: 10/24/2016 17:44    Review of Systems  Eyes:       Previous retinal surgery  laser eye surgery.  Respiratory: Positive for cough. Negative for hemoptysis.   Gastrointestinal:       Positive for cirrhosis and ascites. Past history of cholelithiasis.  Genitourinary:       Positive for renal disease  Musculoskeletal:       Previous infected foot ulcer right foot ultimate BKA for sepsis and then later AKA with ongoing infection. Right AK stump is healed. He's not been able to be a prosthetic wear due to skin irritation is tried multiple different liners.  Neurological:       Positive for diabetic neuropathy.  Endo/Heme/Allergies:       Diabetes with the previous amputation right lower extremity above-knee amputation. Ulcer left first metatarsal head medially for many weeks. He been on doxycycline recently by Dr. Kevan Ny follows his diabetes.  Psychiatric/Behavioral: Negative for hallucinations and memory loss. The patient is not nervous/anxious.    Blood pressure 111/67, pulse 64, temperature 97.4 F (36.3 C), temperature source Oral, resp. rate (!) 25, SpO2 97 %. Physical Exam  Constitutional: He appears well-developed and well-nourished.  Eyes: EOM are normal.  Glasses  Neck: Normal range of motion.  Respiratory: Effort normal.  GI: He exhibits distension.  Positive for ascites.  Musculoskeletal:  Right AKA well-healed. Left the grade 4 first metatarsal ulcer size of a $0.50 piece. Purulent drainage.  Neurological: He is alert.  Peripheral neuropathy left foot.  Skin: Skin is warm.  Grade 4 left first metatarsal head ulcer probes to bone  Psychiatric: He has a normal mood and affect. His behavior is normal. Thought content normal.    Assessment/Plan: Long discussion about the treatment plans. He has liver disease, renal disease, diabetes. 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 There is osteomyelitis involving the first metatarsal head. We'll proceed with first ray amputation. He may likely require a VAC placed to assist with healing.  His is at increased risk due to his multiorgan system abnormalities for surgery. He understands that without debridement of the infected bone this does not have a chance at healing. ABIs were reviewed. He has some noncompressible posterior tibial vessel. Capillary refill is adequate. He understands and drop cultures will be obtained and will take a couple days for antibiotics to be properly adjusted based on culture results. Risks of nonhealing discussed. 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 Questions were elicited and answered. Surgery scheduled later today.  Marybelle Killings 10/25/2016, 7:18 AM

## 2016-10-25 NOTE — Anesthesia Procedure Notes (Signed)
Procedure Name: LMA Insertion Date/Time: 10/25/2016 4:55 PM Performed by: Little IshikawaMERCER, Crosby Oriordan L Pre-anesthesia Checklist: Patient identified, Emergency Drugs available, Suction available and Patient being monitored Patient Re-evaluated:Patient Re-evaluated prior to inductionOxygen Delivery Method: Circle System Utilized Preoxygenation: Pre-oxygenation with 100% oxygen Intubation Type: IV induction Ventilation: Mask ventilation without difficulty LMA: LMA inserted LMA Size: 5.0 Number of attempts: 1 Airway Equipment and Method: Bite block Placement Confirmation: positive ETCO2 and breath sounds checked- equal and bilateral Tube secured with: Tape Dental Injury: Teeth and Oropharynx as per pre-operative assessment

## 2016-10-26 ENCOUNTER — Encounter (HOSPITAL_COMMUNITY): Payer: Self-pay | Admitting: Orthopaedic Surgery

## 2016-10-26 DIAGNOSIS — M869 Osteomyelitis, unspecified: Secondary | ICD-10-CM | POA: Diagnosis present

## 2016-10-26 DIAGNOSIS — E11621 Type 2 diabetes mellitus with foot ulcer: Secondary | ICD-10-CM | POA: Diagnosis present

## 2016-10-26 DIAGNOSIS — R1084 Generalized abdominal pain: Secondary | ICD-10-CM

## 2016-10-26 DIAGNOSIS — R109 Unspecified abdominal pain: Secondary | ICD-10-CM | POA: Insufficient documentation

## 2016-10-26 DIAGNOSIS — K922 Gastrointestinal hemorrhage, unspecified: Secondary | ICD-10-CM | POA: Diagnosis present

## 2016-10-26 DIAGNOSIS — L97509 Non-pressure chronic ulcer of other part of unspecified foot with unspecified severity: Secondary | ICD-10-CM

## 2016-10-26 DIAGNOSIS — N179 Acute kidney failure, unspecified: Secondary | ICD-10-CM | POA: Insufficient documentation

## 2016-10-26 DIAGNOSIS — I8500 Esophageal varices without bleeding: Secondary | ICD-10-CM

## 2016-10-26 DIAGNOSIS — K529 Noninfective gastroenteritis and colitis, unspecified: Secondary | ICD-10-CM | POA: Diagnosis present

## 2016-10-26 LAB — GLUCOSE, CAPILLARY
GLUCOSE-CAPILLARY: 119 mg/dL — AB (ref 65–99)
GLUCOSE-CAPILLARY: 132 mg/dL — AB (ref 65–99)
Glucose-Capillary: 103 mg/dL — ABNORMAL HIGH (ref 65–99)
Glucose-Capillary: 111 mg/dL — ABNORMAL HIGH (ref 65–99)
Glucose-Capillary: 140 mg/dL — ABNORMAL HIGH (ref 65–99)
Glucose-Capillary: 145 mg/dL — ABNORMAL HIGH (ref 65–99)

## 2016-10-26 LAB — CBC
HCT: 26.9 % — ABNORMAL LOW (ref 39.0–52.0)
HEMOGLOBIN: 9.2 g/dL — AB (ref 13.0–17.0)
MCH: 34.1 pg — AB (ref 26.0–34.0)
MCHC: 34.2 g/dL (ref 30.0–36.0)
MCV: 99.6 fL (ref 78.0–100.0)
Platelets: 75 10*3/uL — ABNORMAL LOW (ref 150–400)
RBC: 2.7 MIL/uL — AB (ref 4.22–5.81)
RDW: 14.5 % (ref 11.5–15.5)
WBC: 4.5 10*3/uL (ref 4.0–10.5)

## 2016-10-26 LAB — COMPREHENSIVE METABOLIC PANEL
ALT: 26 U/L (ref 17–63)
ANION GAP: 7 (ref 5–15)
AST: 61 U/L — ABNORMAL HIGH (ref 15–41)
Albumin: 1.5 g/dL — ABNORMAL LOW (ref 3.5–5.0)
Alkaline Phosphatase: 52 U/L (ref 38–126)
BUN: 30 mg/dL — ABNORMAL HIGH (ref 6–20)
CALCIUM: 7.9 mg/dL — AB (ref 8.9–10.3)
CHLORIDE: 106 mmol/L (ref 101–111)
CO2: 19 mmol/L — AB (ref 22–32)
Creatinine, Ser: 1.77 mg/dL — ABNORMAL HIGH (ref 0.61–1.24)
GFR, EST AFRICAN AMERICAN: 44 mL/min — AB (ref >60)
GFR, EST NON AFRICAN AMERICAN: 38 mL/min — AB (ref >60)
Glucose, Bld: 131 mg/dL — ABNORMAL HIGH (ref 65–99)
Potassium: 4.3 mmol/L (ref 3.5–5.1)
SODIUM: 132 mmol/L — AB (ref 135–145)
Total Bilirubin: 1.4 mg/dL — ABNORMAL HIGH (ref 0.3–1.2)
Total Protein: 6 g/dL — ABNORMAL LOW (ref 6.5–8.1)

## 2016-10-26 MED ORDER — OXYCODONE-ACETAMINOPHEN 7.5-325 MG PO TABS
1.0000 | ORAL_TABLET | ORAL | Status: DC | PRN
  Administered 2016-10-26 – 2016-10-29 (×7): 1 via ORAL
  Filled 2016-10-26 (×8): qty 1

## 2016-10-26 MED ORDER — ONDANSETRON HCL 4 MG/2ML IJ SOLN
4.0000 mg | Freq: Four times a day (QID) | INTRAMUSCULAR | Status: DC | PRN
  Administered 2016-10-26 – 2016-10-30 (×5): 4 mg via INTRAVENOUS
  Filled 2016-10-26 (×6): qty 2

## 2016-10-26 NOTE — Progress Notes (Signed)
Subjective: Doing ok.  Pain controlled left foot.     Objective: Vital signs in last 24 hours: Temp:  [97 F (36.1 C)-97.6 F (36.4 C)] 97.3 F (36.3 C) (02/08 0830) Pulse Rate:  [55-72] 69 (02/08 0830) Resp:  [11-19] 19 (02/08 0830) BP: (86-108)/(49-72) 108/60 (02/08 0830) SpO2:  [94 %-100 %] 100 % (02/08 0830) Weight:  [230 lb 1.6 oz (104.4 kg)] 230 lb 1.6 oz (104.4 kg) (02/08 0318)  Intake/Output from previous day: 02/07 0701 - 02/08 0700 In: 1829.6 [P.O.:360; I.V.:1169.6; IV Piggyback:300] Out: 245 [Urine:200; Drains:25; Blood:20] Intake/Output this shift: No intake/output data recorded.   Recent Labs  10/23/16 1138 10/23/16 2340 10/24/16 1243 10/26/16 0252  HGB 10.5* 10.1* 11.2* 9.2*    Recent Labs  10/23/16 2340 10/24/16 1243 10/26/16 0252  WBC 7.7  --  4.5  RBC 2.94*  --  2.70*  HCT 29.1* 32.8* 26.9*  PLT 92*  --  75*    Recent Labs  10/23/16 2340 10/26/16 0252  NA 132* 132*  K 4.9 4.3  CL 106 106  CO2 18* 19*  BUN 39* 30*  CREATININE 2.16* 1.77*  GLUCOSE 105* 131*  CALCIUM 8.1* 7.9*    Recent Labs  10/23/16 1138  INR 1.96    Exam: alert and oriented.  Dressing c/d/i.  Wound vac intact.    Assessment/Plan: Will need snf placement vs CIR.     Zonia KiefJames Amier Hoyt 10/26/2016, 9:04 AM

## 2016-10-26 NOTE — Progress Notes (Signed)
md note states will prob need snf vs cir. sw ref placed in case needed. Pt does live alone and little support per prim pcp .

## 2016-10-26 NOTE — Progress Notes (Signed)
PROGRESS NOTE    Jeffery [name]  ZOX:[id number] DOB: 09/06/1951 DOA: 10/23/2016 PCP: Julian Hy, MD   Brief Narrative:  66 y.o. male PMHx Liver cirrhosis with Ascites, Esophageal varices, Portal Hypertensive gastropathy, Hemolytic anemia, Thrombocytopenia, DM type II uncontrolled with complications, S/P right AKA for DM foot infection,   Presented to the emergency room with complaints of abdominal pain and frequent diarrhea. Patient has been tested for hepatitis and screening was negative. He is not an alcoholic and has no previous history of heavy alcohol use. During the last few weeks he's been having frequent loose stools that were worsening in frequency over the last 2 - 3 days  to the point that he is unable to eat because food intake causes him to run to the bathroom continuously and .he has become severely weak. He also c/o having nausea and dry heaves, feeling of fullness in the stomack that despite diarrhea is not getting better, but feeling worse. Patient also reported of becoming very emotional and crying a lot without any apparent reason  He has been treated with tetracycline ( now the second round) for nearly 4 weeks for non healing diabetic ulcer of the left foot and erythema is still persent and he dose not feel that the wound is healing, although erythema has improved  ED Course: In the ED patient was found to be hypotensive with blood pressure 89/49 that responded well to IV fluids with improvement and reading to 114/61 mmHg, was mildly febrile picture of 99.91F CBC revealed hemoglobin of 10.5 which is significantly lower than the previous hemoglobin in November 2017 stashed 12.5 g/dL, platelets were 811,914 and white blood cells count was normal CMP revealed mildly elevated hyponatremia-132, slightly elevated potassium 5.40, glucose of 157, and abnormal creatinine of 2.49 and DM 39, which is new finding. He also has elevated AST-41 and hyperbilirubinemia-3.2 Lactic Acid  was elevated at 2.77 FEcal occult blood was positive, C.Diff PCR is still pending   Subjective: 2/8  A/O 4,  patient on PO pain medication post palpitation LLE first metatarsal now with significant N/V. Also complains of diarrhea.     Assessment & Plan:   Active Problems:   Hemolytic anemia (HCC)   Cirrhosis of liver with ascites (HCC)   Esophageal varices without bleeding (HCC)   Portal hypertensive gastropathy   C. difficile colitis   Diarrhea   Osteomyelitis (HCC)   Noninfectious gastroenteritis   Gastrointestinal hemorrhage   Controlled diabetes mellitus type 2 with complications (HCC)   Osteomyelitis of left foot (HCC)   Acute renal failure (HCC)   Nausea, abdominal pain, Diarrhea  - symptoms have been worsening lately -C. difficile PCR negative  -D5-0.9% saline at 44ml/hr  Colitis -Most likely inflammatory/reactive given that patient has negative leukocytosis, negative fever. -Stool culture pending, may want to involve GI however more important issue is left foot  GI bleed with blood loss anemia  - patient with h/o hemolytic/G6PD anemia -Currently hemodynamically stable -Transfuse for hemoglobin<7 Recent Labs Lab 10/23/16 1138 10/23/16 2340 10/24/16 1243 10/26/16 0252  HGB 10.5* 10.1* 11.2* 9.2*   Thrombocytopenia - currently stable -Most likely secondary to his osteomyelitis/cellulitis -Should recover Continue to monitor. Obtain peripheral blood smear (will need to contact pathology in the a.m. for read),  -transfuse platelets if level falls critically and consider oral  steroids (ITP?). S/P surgical amputation LLE first metatarsal monitor to see if platelets begin to rise  Liver cirrhosis with known history of ascites -Hepatitis testing in outpatient setting was negative -  Abdominal CT: Cholelithiasis/colitis see results below   DM type II controlled with complications -2/6 Hemoglobin A1c= 5.8 -Lipid panel: Within ADA guidelines -Hold oral meds,    -sensitive SSI  Osteomyelitis/cellulitis Ulcer of the Lt foot -Left foot MRI C/W osteomyelitis - IV doxycycline and ZOsyn -[facility] will see patient -S/P LLE first metatarsal resection see results below  Acute renal failure Continue IVF and monitor renal function Lab Results  Component Value Date   CREATININE 1.77 (H) 10/26/2016   CREATININE 2.16 (H) 10/23/2016   CREATININE 2.49 (H) 10/23/2016  -Improving     DVT prophylaxis: SCD Code Status: Full Family Communication: None Disposition Plan: CIR vs SNF   Consultants:  Dr. [facility]   Procedures/Significant Events:  2/5 CT abdomen pelvis w contrast: -Cirrhosis with large volume ascites. -Mild relative bowel wall thickening throughout the colon may reflect reactive edema secondary to ascites versus mild colitis. -Cholelithiasis. -Bilateral renal cysts. 2/6 MRI left foot W contrast: -Previous amputation of the right first toe.  -Soft tissue edema consistent with cellulitis and ulceration over the first metatarsal head. Edema in the plantar musculature.  -Focal bone erosion and marrow signal changes at the distal first metatarsal head and involving the proximal phalangeal stump likely indicates focal osteomyelitis. 2/6 ultrasound guided paracentesis 3.3 L removed 2/7 Left first ray amputation (transmetatarsal, single ray )   VENTILATOR SETTINGS:    Cultures 2/5 blood NGTD 2/5 C. difficile negative 2/8 stool culture pending   Antimicrobials: Anti-infectives    Start     Stop   10/24/16 0600  piperacillin-tazobactam (ZOSYN) IVPB 3.375 g    Comments:  Zosyn 3.375 g IV q8h for CrCl > 10 mL/min       10/23/16 2330  doxycycline (VIBRAMYCIN) 100 mg in dextrose 5 % 250 mL IVPB         10/23/16 2330  piperacillin-tazobactam (ZOSYN) IVPB 3.375 g  Status:  Discontinued     10/24/16 0130       Devices    LINES / TUBES:      Continuous Infusions: . dextrose 5 % and  0.9% NaCl 50 mL/hr at 10/25/16 2329     Objective: Vitals:   10/25/16 2100 10/25/16 2333 10/26/16 0000 10/26/16 0318  BP: 107/64 103/61 (!) 102/58 (!) 95/55  Pulse: 64 63 63   Resp: 13 14 15 15   Temp:  97.4 F (36.3 C)  97.6 F (36.4 C)  TempSrc:  Oral  Oral  SpO2: 100% 100% 100% 100%  Weight:    104.4 kg (230 lb 1.6 oz)    Intake/Output Summary (Last 24 hours) at 10/26/16 0730 Last data filed at 10/25/16 2000  Gross per 24 hour  Intake          1829.58 ml  Output              245 ml  Net          1584.58 ml   Filed Weights   10/26/16 0318  Weight: 104.4 kg (230 lb 1.6 oz)    Examination:  General: A/O 4, NAD, Uncomfortable secondary to an/3, No acute respiratory distress Eyes: negative scleral hemorrhage, negative anisocoria, negative icterus ENT: Negative Runny nose, negative gingival bleeding, Neck:  Negative scars, masses, torticollis, lymphadenopathy, JVD Lungs: Clear to auscultation bilaterally without wheezes or crackles Cardiovascular: Regular rate and rhythm without murmur gallop or rub normal S1 and S2 Abdomen: negative abdominal pain, nondistended, positive soft, bowel sounds, no rebound, no ascites, no  appreciable mass Extremities: right AKA, left foot swollen, covered and clean did not take down dressing. Minimal drainage from wound VAC.   Skin:  Psychiatric:  Negative depression, negative anxiety, negative fatigue, negative mania  Central nervous system:  Cranial nerves II through XII intact, tongue/uvula midline, all extremities muscle strength 5/5, sensation intact throughout,negative dysarthria, negative expressive aphasia, negative receptive aphasia. .     Data Reviewed: Care during the described time interval was provided by me .  I have reviewed this patient's available data, including medical history, events of note, physical examination, and all test results as part of my evaluation. I have personally reviewed and interpreted all radiology  studies.  CBC:  Recent Labs Lab 10/23/16 1138 10/23/16 2340 10/24/16 1243 10/26/16 0252  WBC 9.5 7.7  --  4.5  NEUTROABS 7.5  --   --   --   HGB 10.5* 10.1* 11.2* 9.2*  HCT 30.2* 29.1* 32.8* 26.9*  MCV 99.3 99.0  --  99.6  PLT 115* 92*  --  75*   Basic Metabolic Panel:  Recent Labs Lab 10/23/16 1138 10/23/16 2340 10/26/16 0252  NA 132* 132* 132*  K 5.4* 4.9 4.3  CL 105 106 106  CO2 19* 18* 19*  GLUCOSE 157* 105* 131*  BUN 39* 39* 30*  CREATININE 2.49* 2.16* 1.77*  CALCIUM 8.6* 8.1* 7.9*   GFR: Estimated Creatinine Clearance: 52.1 mL/min (by C-G formula based on SCr of 1.77 mg/dL (H)). Liver Function Tests:  Recent Labs Lab 10/23/16 1138 10/23/16 2340 10/26/16 0252  AST 45* 42* 61*  ALT 26 24 26   ALKPHOS 76 62 52  BILITOT 3.2* 2.5* 1.4*  PROT 7.1 6.6 6.0*  ALBUMIN 2.1* 1.9* 1.5*   No results for input(s): LIPASE, AMYLASE in the last 168 hours.  Recent Labs Lab 10/23/16 1241  AMMONIA 18   Coagulation Profile:  Recent Labs Lab 10/23/16 1138  INR 1.96   Cardiac Enzymes: No results for input(s): CKTOTAL, CKMB, CKMBINDEX, TROPONINI in the last 168 hours. BNP (last 3 results) No results for input(s): PROBNP in the last 8760 hours. HbA1C:  Recent Labs  10/24/16 1901  HGBA1C 5.8*   CBG:  Recent Labs Lab 10/25/16 0748 10/25/16 1151 10/25/16 1612 10/25/16 2331 10/26/16 0323  GLUCAP 142* 160* 104* 104* 119*   Lipid Profile:  Recent Labs  10/24/16 1901  CHOL 51  HDL 13*  LDLCALC 28  TRIG 48  CHOLHDL 3.9   Thyroid Function Tests: No results for input(s): TSH, T4TOTAL, FREET4, T3FREE, THYROIDAB in the last 72 hours. Anemia Panel: No results for input(s): VITAMINB12, FOLATE, FERRITIN, TIBC, IRON, RETICCTPCT in the last 72 hours. Urine analysis:    Component Value Date/Time   COLORURINE AMBER (A) 10/24/2016 0456   APPEARANCEUR HAZY (A) 10/24/2016 0456   LABSPEC 1.017 10/24/2016 0456   PHURINE 5.0 10/24/2016 0456   GLUCOSEU  NEGATIVE 10/24/2016 0456   HGBUR NEGATIVE 10/24/2016 0456   BILIRUBINUR NEGATIVE 10/24/2016 0456   KETONESUR NEGATIVE 10/24/2016 0456   PROTEINUR NEGATIVE 10/24/2016 0456   NITRITE NEGATIVE 10/24/2016 0456   LEUKOCYTESUR NEGATIVE 10/24/2016 0456   Sepsis Labs: @LABRCNTIP (procalcitonin:4,lacticidven:4)  ) Recent Results (from the past 240 hour(s))  Culture, blood (Routine x 2)     Status: None (Preliminary result)   Collection Time: 10/23/16 11:40 AM  Result Value Ref Range Status   Specimen Description BLOOD RIGHT ANTECUBITAL  Final   Special Requests   Final    BOTTLES DRAWN AEROBIC AND ANAEROBIC 10CC AER 5CC  ANA   Culture NO GROWTH 2 DAYS  Final   Report Status PENDING  Incomplete  Culture, blood (Routine x 2)     Status: None (Preliminary result)   Collection Time: 10/23/16 11:54 AM  Result Value Ref Range Status   Specimen Description BLOOD RIGHT HAND  Final   Special Requests BOTTLES DRAWN AEROBIC ONLY 10CC  Final   Culture NO GROWTH 2 DAYS  Final   Report Status PENDING  Incomplete  C difficile quick scan w PCR reflex     Status: None   Collection Time: 10/23/16  5:47 PM  Result Value Ref Range Status   C Diff antigen NEGATIVE NEGATIVE Final   C Diff toxin NEGATIVE NEGATIVE Final   C Diff interpretation No C. difficile detected.  Final  Urine culture     Status: Abnormal   Collection Time: 10/24/16  4:56 AM  Result Value Ref Range Status   Specimen Description URINE, RANDOM  Final   Special Requests NONE  Final   Culture <10,000 COLONIES/mL INSIGNIFICANT GROWTH (A)  Final   Report Status 10/25/2016 FINAL  Final  Culture, body fluid-bottle     Status: None (Preliminary result)   Collection Time: 10/24/16 11:10 AM  Result Value Ref Range Status   Specimen Description PERITONEAL  Final   Special Requests NONE  Final   Culture NO GROWTH 1 DAY  Final   Report Status PENDING  Incomplete  Gram stain     Status: None   Collection Time: 10/24/16 11:10 AM  Result Value  Ref Range Status   Specimen Description PERITONEAL  Final   Special Requests NONE  Final   Gram Stain   Final    RARE WBC PRESENT,BOTH PMN AND MONONUCLEAR NO ORGANISMS SEEN    Report Status 10/24/2016 FINAL  Final  MRSA PCR Screening     Status: None   Collection Time: 10/24/16  5:12 PM  Result Value Ref Range Status   MRSA by PCR NEGATIVE NEGATIVE Final    Comment:        The GeneXpert MRSA Assay (FDA approved for NASAL specimens only), is one component of a comprehensive MRSA colonization surveillance program. It is not intended to diagnose MRSA infection nor to guide or monitor treatment for MRSA infections.   Anaerobic culture     Status: None (Preliminary result)   Collection Time: 10/25/16  5:36 PM  Result Value Ref Range Status   Specimen Description WOUND LEFT TOE  Final   Special Requests ANAEROBIC SWAB ONLY  Final   Gram Stain   Final    ABUNDANT WBC PRESENT, PREDOMINANTLY PMN RARE GRAM POSITIVE COCCI IN PAIRS    Culture PENDING  Incomplete   Report Status PENDING  Incomplete         Radiology Studies: US Paracentesis  Result Date: 10/24/2016 INDICATION: New onset abdominal pain with new findings of ascites. Request is made for diagnostic and therapeutic paracentesis. EXAM: ULTRASOUND GUIDED DIAGNOSTIC AND THERAPEUTIC PARACENTESIS MEDICATIONS: 1% lidocaine. COMPLICATIONS: None immediate. PROCEDURE: Informed written consent was obtained from the patient after a discussion of the risks, benefits and alternatives to treatment. A timeout was performed prior to the initiation of the procedure. Initial ultrasound scanning demonstrates a small amount of ascites within the right lower abdominal quadrant. The right lower abdomen was prepped and draped in the usual sterile fashion. 1% lidocaine was used for local anesthesia. Following this, a 19 gauge, 7-cm, Yueh catheter was introduced. An ultrasound image was saved for documentation  purposes. The paracentesis was  performed. The catheter was removed and a dressing was applied. The patient tolerated the procedure well without immediate post procedural complication. FINDINGS: A total of approximately 3.3 L of yellow fluid was removed. Samples were sent to the laboratory as requested by the clinical team. IMPRESSION: Successful ultrasound-guided paracentesis yielding 3.3 liters of peritoneal fluid. Read by: Barnetta Chapel, PA-C Electronically Signed   By: Simonne Come M.D.   On: 10/24/2016 11:23   Dg Foot 2 Views Left  Result Date: 10/24/2016 CLINICAL DATA:  Osteomyelitis EXAM: LEFT FOOT - 2 VIEW COMPARISON:  MRI 10/24/2016 FINDINGS: Changes of prior amputation of the left great toe. Lucency/irregularity within the distal aspect of the left first metatarsal may reflect area of bone destruction and osteomyelitis. No additional acute bony abnormality. Prior internal fixation of an old healed fifth metatarsal fracture. IMPRESSION: Lucency in the distal left first metatarsal, possibly area of osteomyelitis. Electronically Signed   By: Charlett Nose M.D.   On: 10/24/2016 17:44        Scheduled Meds: . aspirin EC  81 mg Oral Daily  . atorvastatin  40 mg Oral Daily  . brimonidine  1 drop Both Eyes Daily  . doxycycline (VIBRAMYCIN) IV  100 mg Intravenous Q12H  . insulin aspart  0-9 Units Subcutaneous Q4H  . nadolol  40 mg Oral Daily  . piperacillin-tazobactam (ZOSYN)  IV  3.375 g Intravenous Q8H   Continuous Infusions: . dextrose 5 % and 0.9% NaCl 50 mL/hr at 10/25/16 2329     LOS: 3 days    Time spent: 40 minutes    Alysiah Suppa, Roselind Messier, MD [facility] Pager [phone]   If 7PM-7AM, please contact night-coverage [email or web address] Password [email or web address] 10/26/2016, 7:30 AM

## 2016-10-26 NOTE — Anesthesia Postprocedure Evaluation (Signed)
Anesthesia Post Note  Patient: Iantha FallenKenneth Alia  Procedure(s) Performed: Procedure(s) (LRB): LEFT FOOT DEBRIDEMENT RAY AMPUTATION PLACEMENT OF WOUND VAC (Left)  Patient location during evaluation: PACU Anesthesia Type: General Level of consciousness: awake and alert and patient cooperative Pain management: pain level controlled Vital Signs Assessment: post-procedure vital signs reviewed and stable Respiratory status: spontaneous breathing and respiratory function stable Cardiovascular status: stable Anesthetic complications: no       Last Vitals:  Vitals:   10/26/16 0000 10/26/16 0318  BP: (!) 102/58 (!) 95/55  Pulse: 63   Resp: 15 15  Temp:  36.4 C    Last Pain:  Vitals:   10/26/16 0318  TempSrc: Oral  PainSc:                  Talbot Monarch S

## 2016-10-26 NOTE — Progress Notes (Signed)
Orthopedic Tech Progress Note Patient Details:  Jeffery BoydenKenneth [name] Jul 18, 1951 [id number]  Ortho Devices Type of Ortho Device: Darco shoe Ortho Device/Splint Location: lle Ortho Device/Splint Interventions: Application   Jeffery [name] 10/26/2016, 9:18 AM

## 2016-10-27 ENCOUNTER — Encounter (HOSPITAL_COMMUNITY): Payer: Self-pay | Admitting: Orthopaedic Surgery

## 2016-10-27 DIAGNOSIS — R1013 Epigastric pain: Secondary | ICD-10-CM

## 2016-10-27 LAB — BASIC METABOLIC PANEL
Anion gap: 8 (ref 5–15)
BUN: 24 mg/dL — ABNORMAL HIGH (ref 6–20)
CHLORIDE: 108 mmol/L (ref 101–111)
CO2: 18 mmol/L — ABNORMAL LOW (ref 22–32)
Calcium: 8 mg/dL — ABNORMAL LOW (ref 8.9–10.3)
Creatinine, Ser: 1.81 mg/dL — ABNORMAL HIGH (ref 0.61–1.24)
GFR, EST AFRICAN AMERICAN: 43 mL/min — AB (ref >60)
GFR, EST NON AFRICAN AMERICAN: 37 mL/min — AB (ref >60)
Glucose, Bld: 131 mg/dL — ABNORMAL HIGH (ref 65–99)
Potassium: 4.3 mmol/L (ref 3.5–5.1)
SODIUM: 134 mmol/L — AB (ref 135–145)

## 2016-10-27 LAB — CBC
HEMATOCRIT: 29 % — AB (ref 39.0–52.0)
HEMOGLOBIN: 9.8 g/dL — AB (ref 13.0–17.0)
MCH: 33.8 pg (ref 26.0–34.0)
MCHC: 33.8 g/dL (ref 30.0–36.0)
MCV: 100 fL (ref 78.0–100.0)
Platelets: 92 10*3/uL — ABNORMAL LOW (ref 150–400)
RBC: 2.9 MIL/uL — ABNORMAL LOW (ref 4.22–5.81)
RDW: 14.5 % (ref 11.5–15.5)
WBC: 6.4 10*3/uL (ref 4.0–10.5)

## 2016-10-27 LAB — GLUCOSE, CAPILLARY
GLUCOSE-CAPILLARY: 153 mg/dL — AB (ref 65–99)
GLUCOSE-CAPILLARY: 135 mg/dL — AB (ref 65–99)
GLUCOSE-CAPILLARY: 141 mg/dL — AB (ref 65–99)
Glucose-Capillary: 101 mg/dL — ABNORMAL HIGH (ref 65–99)
Glucose-Capillary: 125 mg/dL — ABNORMAL HIGH (ref 65–99)
Glucose-Capillary: 131 mg/dL — ABNORMAL HIGH (ref 65–99)

## 2016-10-27 LAB — MAGNESIUM: MAGNESIUM: 1.7 mg/dL (ref 1.7–2.4)

## 2016-10-27 MED ORDER — SODIUM CHLORIDE 0.9 % IV SOLN
12.5000 mg | Freq: Once | INTRAVENOUS | Status: AC
  Administered 2016-10-27: 12.5 mg via INTRAVENOUS
  Filled 2016-10-27: qty 0.5

## 2016-10-27 NOTE — Evaluation (Signed)
Occupational Therapy Evaluation Patient Details Name: Jeffery BoydenKenneth [name] MRN: [id number] DOB: 1950/12/15 Today's Date: 10/27/2016    History of Present Illness Pt adm with diarrhea and weakness. Pt also with nonhealing ulcer of lt foot. Pt underwent left first ray amputation. PMH - DM, rt AKA,   Clinical Impression   This 66 yo male admitted and underwent above presents to acute OT with deficits below (see OT problem list) thus affecting his PLOF of Mod I with all his basic and IADLs. He will benefit from acute OT with follow up OT at SNF to get back to PLOF.    Follow Up Recommendations  SNF;Supervision/Assistance - 24 hour    Equipment Recommendations  Other (comment)       Precautions / Restrictions Precautions Precautions: Fall Restrictions Weight Bearing Restrictions: Yes RLE Weight Bearing: Weight bearing as tolerated Other Position/Activity Restrictions: with Darco shoe on      Mobility Bed Mobility Overal bed mobility: Needs Assistance Bed Mobility: Rolling;Sidelying to Sit Rolling: Min assist Sidelying to sit: Mod assist       General bed mobility comments: Assist to elevate trunk into sitting  Transfers Overall transfer level: Needs assistance Equipment used: None Transfers: Sit to/from Visteon CorporationStand;Squat Pivot Transfers Sit to Stand: +2 physical assistance;Min assist   Squat pivot transfers: +2 physical assistance;Min assist     General transfer comment: Assist to bring hips up and for balance. Performed squat pivot bed to bsc to recliner.    Balance Overall balance assessment: Needs assistance Sitting-balance support: No upper extremity supported;Feet supported Sitting balance-Leahy Scale: Good         Standing balance comment: Pt partially stands while holding handles of bsc.                            ADL Overall ADL's : Needs assistance/impaired Eating/Feeding: Independent;Sitting   Grooming: Set up;Sitting   Upper Body Bathing:  Set up;Sitting   Lower Body Bathing: Minimal assistance;Sitting/lateral leans   Upper Body Dressing : Set up;Sitting   Lower Body Dressing: Moderate assistance;Sitting/lateral leans   Toilet Transfer: Moderate assistance;+2 for physical assistance;Squat-pivot   Toileting- Clothing Manipulation and Hygiene: Total assistance (partial stand)                         Pertinent Vitals/Pain Pain Assessment: Faces Faces Pain Scale: Hurts even more Pain Location: Lt thigh Pain Descriptors / Indicators: Spasm;Grimacing Pain Intervention(s): Limited activity within patient's tolerance;Repositioned;Monitored during session     Hand Dominance Right   Extremity/Trunk Assessment Upper Extremity Assessment Upper Extremity Assessment: Overall WFL for tasks assessed   Lower Extremity Assessment Lower Extremity Assessment: Generalized weakness;RLE deficits/detail;LLE deficits/detail RLE Deficits / Details: AKA LLE Deficits / Details: Limited by pain and use of Darco shoe LLE Sensation: history of peripheral neuropathy       Communication Communication Communication: No difficulties   Cognition Arousal/Alertness: Awake/alert Behavior During Therapy: WFL for tasks assessed/performed Overall Cognitive Status: Within Functional Limits for tasks assessed                                Home Living Family/patient expects to be discharged to:: Private residence Living Arrangements: Alone Available Help at Discharge: Friend(s) Type of Home: House Home Access: Ramped entrance     Home Layout: One level     Bathroom Shower/Tub: Producer, television/film/videoWalk-in shower   Bathroom Toilet:  Handicapped height Bathroom Accessibility: Yes   Home Equipment: Wheelchair - Manufacturing systems engineer - built in   Additional Comments: works as professor at [employer]      Prior Functioning/Environment Level of Independence: Independent with assistive device(s)        Comments: Independent with transfers to  w/c. Independent at w/c level.        OT Problem List: Decreased strength;Impaired balance (sitting and/or standing);Obesity;Pain   OT Treatment/Interventions: Self-care/ADL training;Therapeutic activities;Patient/family education;DME and/or AE instruction;Balance training    OT Goals(Current goals can be found in the care plan section) Acute Rehab OT Goals Patient Stated Goal: get strength back OT Goal Formulation: With patient Time For Goal Achievement: 11/10/16 Potential to Achieve Goals: Good  OT Frequency: Min 2X/week   Barriers to D/C: Decreased caregiver support          Co-evaluation PT/OT/SLP Co-Evaluation/Treatment: Yes Reason for Co-Treatment: For patient/therapist safety PT goals addressed during session: Mobility/safety with mobility OT goals addressed during session: ADL's and self-care;Strengthening/ROM      End of Session Equipment Utilized During Treatment: Gait belt (darco shoe) Nurse Communication: Mobility status  Activity Tolerance: Patient tolerated treatment well Patient left: in chair;with call bell/phone within reach   Time: 1122-1205 OT Time Calculation (min): 43 min Charges:  OT General Charges $OT Visit: 1 Procedure OT Evaluation $OT Eval Moderate Complexity: 1 Procedure OT Treatments $Self Care/Home Management : 8-22 mins  Evette Georges [phone] 10/27/2016, 1:34 PM

## 2016-10-27 NOTE — Care Management Note (Signed)
Case Management Note  Patient Details  Name: Jeffery [name] MRN: [id number] Date of Birth: January 10, 1951  Subjective/Objective:      Adm w c diff colitis              Action/Plan: lives at home, sw for snf vs cir   Expected Discharge Date:                  Expected Discharge Plan:  Skilled Nursing Facility  In-House Referral:  Clinical Social Work  Discharge planning Services     Post Acute Care Choice:    Choice offered to:     DME Arranged:    DME Agency:     HH Arranged:    HH Agency:     Status of Service:  In process, will continue to follow  If discussed at Long Length of Stay Meetings, dates discussed:    Additional Comments: pt has vac, have placed vac order form on shadow chart.  Hanley Haysowell, Kingsley Farace T, RN 10/27/2016, 10:35 AM

## 2016-10-27 NOTE — Progress Notes (Signed)
Subjective: Patient doing well.   Objective: Vital signs in last 24 hours: Temp:  [97.3 F (36.3 C)-97.8 F (36.6 C)] 97.5 F (36.4 C) (02/09 0840) Pulse Rate:  [58-74] 66 (02/09 1000) Resp:  [12-19] 19 (02/09 1000) BP: (83-113)/(49-81) 106/72 (02/09 1000) SpO2:  [97 %-100 %] 100 % (02/09 1000)  Intake/Output from previous day: 02/08 0701 - 02/09 0700 In: 3816.7 [P.O.:540; I.V.:2226.7; IV Piggyback:1050] Out: 802 [Urine:800; Stool:2] Intake/Output this shift: Total I/O In: 240 [P.O.:240] Out: -    Recent Labs  10/24/16 1243 10/26/16 0252  HGB 11.2* 9.2*    Recent Labs  10/24/16 1243 10/26/16 0252  WBC  --  4.5  RBC  --  2.70*  HCT 32.8* 26.9*  PLT  --  75*    Recent Labs  10/26/16 0252  NA 132*  K 4.3  CL 106  CO2 19*  BUN 30*  CREATININE 1.77*  GLUCOSE 131*  CALCIUM 7.9*   No results for input(s): LABPT, INR in the last 72 hours.  Exam:  Patient alert and oriented.  No acute distress . Foot dressing intact. Wound VAC intact.  Assessment/Plan: We'll let patient weight-bear as tolerated in Darco shoe only. Continue present care. Awaiting placement.   Zonia KiefJames Icy Fuhrmann 10/27/2016, 10:37 AM

## 2016-10-27 NOTE — Progress Notes (Signed)
PROGRESS NOTE    Jeffery [name]  ZOX:[id number] DOB: 01-07-1951 DOA: 10/23/2016 PCP: Julian Hy, MD   Brief Narrative:  66 y.o. male PMHx Liver cirrhosis with Ascites, Esophageal varices, Portal Hypertensive gastropathy, Hemolytic anemia, Thrombocytopenia, DM type II uncontrolled with complications, S/P right AKA for DM foot infection,   Presented to the emergency room with complaints of abdominal pain and frequent diarrhea. Patient has been tested for hepatitis and screening was negative. He is not an alcoholic and has no previous history of heavy alcohol use. During the last few weeks he's been having frequent loose stools that were worsening in frequency over the last 2 - 3 days  to the point that he is unable to eat because food intake causes him to run to the bathroom continuously and .he has become severely weak. He also c/o having nausea and dry heaves, feeling of fullness in the stomack that despite diarrhea is not getting better, but feeling worse. Patient also reported of becoming very emotional and crying a lot without any apparent reason  He has been treated with tetracycline ( now the second round) for nearly 4 weeks for non healing diabetic ulcer of the left foot and erythema is still persent and he dose not feel that the wound is healing, although erythema has improved  ED Course: In the ED patient was found to be hypotensive with blood pressure 89/49 that responded well to IV fluids with improvement and reading to 114/61 mmHg, was mildly febrile picture of 99.31F CBC revealed hemoglobin of 10.5 which is significantly lower than the previous hemoglobin in November 2017 stashed 12.5 g/dL, platelets were 811,914 and white blood cells count was normal CMP revealed mildly elevated hyponatremia-132, slightly elevated potassium 5.40, glucose of 157, and abnormal creatinine of 2.49 and DM 39, which is new finding. He also has elevated AST-41 and hyperbilirubinemia-3.2 Lactic Acid  was elevated at 2.77 FEcal occult blood was positive, C.Diff PCR is still pending   Subjective: 2/9  A/O 4. Patient been sitting in chair for~3 hours today. Nausea has resolved with current treatment.     Assessment & Plan:   Active Problems:   Hemolytic anemia (HCC)   Cirrhosis of liver with ascites (HCC)   Esophageal varices without bleeding (HCC)   Portal hypertensive gastropathy   C. difficile colitis   Diarrhea   Osteomyelitis (HCC)   Noninfectious gastroenteritis   Gastrointestinal hemorrhage   Controlled diabetes mellitus type 2 with complications (HCC)   Osteomyelitis of left foot (HCC)   Acute renal failure (HCC)   Abdominal pain   Nausea, abdominal pain, Diarrhea  - symptoms have been worsening lately -C. difficile PCR negative  -D5-0.9% saline 42ml/hr -Strict in and out since admission + 8.8 L -Daily weight Filed Weights   10/26/16 0318 10/27/16 1433  Weight: 104.4 kg (230 lb 1.6 oz) 104.3 kg (229 lb 14.7 oz)   Colitis -Most likely inflammatory/reactive given that patient has negative leukocytosis, negative fever. -Stool culture pending  GI bleed with blood loss anemia  - patient with h/o hemolytic/G6PD anemia -Currently hemodynamically stable -Transfuse for hemoglobin<7 Recent Labs Lab 10/23/16 1138 10/23/16 2340 10/24/16 1243 10/26/16 0252 10/27/16 0948  HGB 10.5* 10.1* 11.2* 9.2* 9.8*    Thrombocytopenia - currently stable -Most likely secondary to his osteomyelitis/cellulitis -Should recover Continue to monitor. Obtain peripheral blood smear (will need to contact pathology in the a.m. for read),  -transfuse platelets if level falls critically and consider oral  steroids (ITP?). S/P surgical amputation LLE first  metatarsal monitor to see if platelets begin to rise -2/9 slowly trending up  Liver cirrhosis with known history of ascites -Hepatitis testing in outpatient setting was negative -Abdominal CT: Cholelithiasis/colitis see results  below   DM type II controlled with complications -2/6 Hemoglobin A1c= 5.8 -Lipid panel: Within ADA guidelines -Hold oral meds,  -sensitive SSI  Osteomyelitis/cellulitis Ulcer of the Lt foot -Left foot MRI C/W osteomyelitis - IV doxycycline and ZOsyn -[facility] will [facility] -S/P LLE first metatarsal resection see results below  Acute renal failure (baseline Cr~1.3) Continue IVF and monitor renal function Lab Results  Component Value Date   CREATININE 1.81 (H) 10/27/2016   CREATININE 1.77 (H) 10/26/2016   CREATININE 2.16 (H) 10/23/2016  -starting to trend up -See diarrhea      DVT prophylaxis: SCD Code Status: Full Family Communication: None Disposition Plan: CIR vs SNF   Consultants:  [facility]   Procedures/Significant Events:  2/5 CT abdomen pelvis w contrast: -Cirrhosis with large volume ascites. -Mild relative bowel wall thickening throughout the colon may reflect reactive edema secondary to ascites versus mild colitis. -Cholelithiasis. -Bilateral renal cysts. 2/6 MRI left foot W contrast: -Previous amputation of the right first toe.  -Soft tissue edema consistent with cellulitis and ulceration over the first metatarsal head. Edema in the plantar musculature.  -Focal bone erosion and marrow signal changes at the distal first metatarsal head and involving the proximal phalangeal stump likely indicates focal osteomyelitis. 2/6 ultrasound guided paracentesis 3.3 L removed 2/7 Left first ray amputation (transmetatarsal, single ray )   VENTILATOR SETTINGS:    Cultures 2/5 blood NGTD 2/5 C. difficile negative 2/8 stool culture pending   Antimicrobials: Anti-infectives    Start     Stop   10/24/16 0600  piperacillin-tazobactam (ZOSYN) IVPB 3.375 g    Comments:  Zosyn 3.375 g IV q8h for CrCl > 10 mL/min       10/23/16 2330  doxycycline (VIBRAMYCIN) 100 mg in dextrose 5 % 250 mL IVPB         10/23/16 2330   piperacillin-tazobactam (ZOSYN) IVPB 3.375 g  Status:  Discontinued     10/24/16 0130       Devices    LINES / TUBES:      Continuous Infusions: . dextrose 5 % and 0.9% NaCl 75 mL/hr at 10/27/16 0553     Objective: Vitals:   10/26/16 1900 10/26/16 2300 10/27/16 0300 10/27/16 0840  BP: 112/72 90/60 (!) 94/53 110/81  Pulse: 62 62 (!) 58 (!) 131  Resp: 18 14 12 13   Temp: 97.8 F (36.6 C) 97.7 F (36.5 C) 97.8 F (36.6 C) 97.5 F (36.4 C)  TempSrc: Oral Oral Oral Oral  SpO2: 99% 100% 97% 97%  Weight:        Intake/Output Summary (Last 24 hours) at 10/27/16 [id number] Last data filed at 10/27/16 0600  Gross per 24 hour  Intake          3576.67 ml  Output              802 ml  Net          2774.67 ml   Filed Weights   10/26/16 0318  Weight: 104.4 kg (230 lb 1.6 oz)    Examination:  General: A/O 4, NAD, NAD, No acute respiratory distress Eyes: negative scleral hemorrhage, negative anisocoria, negative icterus ENT: Negative Runny nose, negative gingival bleeding, Neck:  Negative scars, masses, torticollis, lymphadenopathy, JVD Lungs: Clear to  auscultation bilaterally without wheezes or crackles Cardiovascular: Regular rate and rhythm without murmur gallop or rub normal S1 and S2 Abdomen: negative abdominal pain, nondistended, positive soft, bowel sounds, no rebound, no ascites, no appreciable mass Extremities: right AKA, left foot swollen, covered and clean did not take down dressing. Minimal drainage from wound VAC.   Skin:  Psychiatric:  Negative depression, negative anxiety, negative fatigue, negative mania  Central nervous system:  Cranial nerves II through XII intact, tongue/uvula midline, all extremities muscle strength 5/5, sensation intact throughout,negative dysarthria, negative expressive aphasia, negative receptive aphasia. .     Data Reviewed: Care during the described time interval was provided by me .  I have reviewed this patient's available data,  including medical history, events of note, physical examination, and all test results as part of my evaluation. I have personally reviewed and interpreted all radiology studies.  CBC:  Recent Labs Lab 10/23/16 1138 10/23/16 2340 10/24/16 1243 10/26/16 0252  WBC 9.5 7.7  --  4.5  NEUTROABS 7.5  --   --   --   HGB 10.5* 10.1* 11.2* 9.2*  HCT 30.2* 29.1* 32.8* 26.9*  MCV 99.3 99.0  --  99.6  PLT 115* 92*  --  75*   Basic Metabolic Panel:  Recent Labs Lab 10/23/16 1138 10/23/16 2340 10/26/16 0252  NA 132* 132* 132*  K 5.4* 4.9 4.3  CL 105 106 106  CO2 19* 18* 19*  GLUCOSE 157* 105* 131*  BUN 39* 39* 30*  CREATININE 2.49* 2.16* 1.77*  CALCIUM 8.6* 8.1* 7.9*   GFR: Estimated Creatinine Clearance: 52.1 mL/min (by C-G formula based on SCr of 1.77 mg/dL (H)). Liver Function Tests:  Recent Labs Lab 10/23/16 1138 10/23/16 2340 10/26/16 0252  AST 45* 42* 61*  ALT 26 24 26   ALKPHOS 76 62 52  BILITOT 3.2* 2.5* 1.4*  PROT 7.1 6.6 6.0*  ALBUMIN 2.1* 1.9* 1.5*   No results for input(s): LIPASE, AMYLASE in the last 168 hours.  Recent Labs Lab 10/23/16 1241  AMMONIA 18   Coagulation Profile:  Recent Labs Lab 10/23/16 1138  INR 1.96   Cardiac Enzymes: No results for input(s): CKTOTAL, CKMB, CKMBINDEX, TROPONINI in the last 168 hours. BNP (last 3 results) No results for input(s): PROBNP in the last 8760 hours. HbA1C:  Recent Labs  10/24/16 1901  HGBA1C 5.8*   CBG:  Recent Labs Lab 10/26/16 1726 10/26/16 1957 10/26/16 2350 10/27/16 0400 10/27/16 0854  GLUCAP 132* 140* 145* 125* 101*   Lipid Profile:  Recent Labs  10/24/16 1901  CHOL 51  HDL 13*  LDLCALC 28  TRIG 48  CHOLHDL 3.9   Thyroid Function Tests: No results for input(s): TSH, T4TOTAL, FREET4, T3FREE, THYROIDAB in the last 72 hours. Anemia Panel: No results for input(s): VITAMINB12, FOLATE, FERRITIN, TIBC, IRON, RETICCTPCT in the last 72 hours. Urine analysis:    Component Value  Date/Time   COLORURINE AMBER (A) 10/24/2016 0456   APPEARANCEUR HAZY (A) 10/24/2016 0456   LABSPEC 1.017 10/24/2016 0456   PHURINE 5.0 10/24/2016 0456   GLUCOSEU NEGATIVE 10/24/2016 0456   HGBUR NEGATIVE 10/24/2016 0456   BILIRUBINUR NEGATIVE 10/24/2016 0456   KETONESUR NEGATIVE 10/24/2016 0456   PROTEINUR NEGATIVE 10/24/2016 0456   NITRITE NEGATIVE 10/24/2016 0456   LEUKOCYTESUR NEGATIVE 10/24/2016 0456   Sepsis Labs: @LABRCNTIP (procalcitonin:4,lacticidven:4)  ) Recent Results (from the past 240 hour(s))  Culture, blood (Routine x 2)     Status: None (Preliminary result)   Collection Time: 10/23/16 11:40  AM  Result Value Ref Range Status   Specimen Description BLOOD RIGHT ANTECUBITAL  Final   Special Requests   Final    BOTTLES DRAWN AEROBIC AND ANAEROBIC 10CC AER 5CC ANA   Culture NO GROWTH 3 DAYS  Final   Report Status PENDING  Incomplete  Culture, blood (Routine x 2)     Status: None (Preliminary result)   Collection Time: 10/23/16 11:54 AM  Result Value Ref Range Status   Specimen Description BLOOD RIGHT HAND  Final   Special Requests BOTTLES DRAWN AEROBIC ONLY 10CC  Final   Culture NO GROWTH 3 DAYS  Final   Report Status PENDING  Incomplete  C difficile quick scan w PCR reflex     Status: None   Collection Time: 10/23/16  5:47 PM  Result Value Ref Range Status   C Diff antigen NEGATIVE NEGATIVE Final   C Diff toxin NEGATIVE NEGATIVE Final   C Diff interpretation No C. difficile detected.  Final  Urine culture     Status: Abnormal   Collection Time: 10/24/16  4:56 AM  Result Value Ref Range Status   Specimen Description URINE, RANDOM  Final   Special Requests NONE  Final   Culture <10,000 COLONIES/mL INSIGNIFICANT GROWTH (A)  Final   Report Status 10/25/2016 FINAL  Final  Culture, body fluid-bottle     Status: None (Preliminary result)   Collection Time: 10/24/16 11:10 AM  Result Value Ref Range Status   Specimen Description PERITONEAL  Final   Special Requests  NONE  Final   Culture NO GROWTH 2 DAYS  Final   Report Status PENDING  Incomplete  Gram stain     Status: None   Collection Time: 10/24/16 11:10 AM  Result Value Ref Range Status   Specimen Description PERITONEAL  Final   Special Requests NONE  Final   Gram Stain   Final    RARE WBC PRESENT,BOTH PMN AND MONONUCLEAR NO ORGANISMS SEEN    Report Status 10/24/2016 FINAL  Final  MRSA PCR Screening     Status: None   Collection Time: 10/24/16  5:12 PM  Result Value Ref Range Status   MRSA by PCR NEGATIVE NEGATIVE Final    Comment:        The GeneXpert MRSA Assay (FDA approved for NASAL specimens only), is one component of a comprehensive MRSA colonization surveillance program. It is not intended to diagnose MRSA infection nor to guide or monitor treatment for MRSA infections.   Anaerobic culture     Status: None (Preliminary result)   Collection Time: 10/25/16  5:36 PM  Result Value Ref Range Status   Specimen Description WOUND LEFT TOE  Final   Special Requests ANAEROBIC SWAB ONLY  Final   Gram Stain   Final    ABUNDANT WBC PRESENT, PREDOMINANTLY PMN RARE GRAM POSITIVE COCCI IN PAIRS    Culture PENDING  Incomplete   Report Status PENDING  Incomplete         Radiology Studies: No results found.      Scheduled Meds: . aspirin EC  81 mg Oral Daily  . atorvastatin  40 mg Oral Daily  . brimonidine  1 drop Both Eyes Daily  . doxycycline (VIBRAMYCIN) IV  100 mg Intravenous Q12H  . insulin aspart  0-9 Units Subcutaneous Q4H  . nadolol  40 mg Oral Daily  . piperacillin-tazobactam (ZOSYN)  IV  3.375 g Intravenous Q8H   Continuous Infusions: . dextrose 5 % and 0.9% NaCl 75 mL/hr  at 10/27/16 0553     LOS: 4 days    Time spent: 40 minutes    WOODS, Roselind Messier, MD [facility] Pager [phone]   If 7PM-7AM, please contact night-coverage [email or web address] Password [email or web address] 10/27/2016, 9:16 AM

## 2016-10-27 NOTE — Evaluation (Signed)
Physical Therapy Evaluation Patient Details Name: Jeffery [name] MRN: [id number] DOB: [birth year] Today's Date: 10/27/2016   History of Present Illness  Pt adm with diarrhea and weakness. Pt also with nonhealing ulcer of lt foot. Pt underwent left first ray amputation. PMH - DM, rt AKA,  Clinical Impression  Pt admitted with above diagnosis and presents to PT with functional limitations due to deficits listed below (See PT problem list). Pt needs skilled PT to maximize independence and safety to allow discharge to ST-SNF prior to return home. At baseline pt independent at w/c level. Now deconditioned from extended illness and with new toe amputation. Lives alone and feel pt needs SNF to return to prior level of function.     Follow Up Recommendations SNF    Equipment Recommendations  None recommended by PT    Recommendations for Other Services       Precautions / Restrictions Precautions Precautions: Fall Restrictions Weight Bearing Restrictions: Yes RLE Weight Bearing: Weight bearing as tolerated Other Position/Activity Restrictions: with Darco shoe on      Mobility  Bed Mobility Overal bed mobility: Needs Assistance Bed Mobility: Rolling;Sidelying to Sit Rolling: Min assist Sidelying to sit: Mod assist       General bed mobility comments: Assist to elevate trunk into sitting  Transfers Overall transfer level: Needs assistance Equipment used: None Transfers: Sit to/from [employer] Sit to Stand: +2 physical assistance;Min assist   Squat pivot transfers: +2 physical assistance;Min assist     General transfer comment: Assist to bring hips up and for balance. Performed squat pivot bed to bsc to recliner.  Ambulation/Gait                Stairs            Wheelchair Mobility    Modified Rankin (Stroke Patients Only)       Balance Overall balance assessment: Needs assistance Sitting-balance support: No upper extremity  supported;Feet supported Sitting balance-Leahy Scale: Good         Standing balance comment: Pt partially stands while holding handles of bsc.                             Pertinent Vitals/Pain Pain Assessment: Faces Faces Pain Scale: Hurts even more Pain Location: Lt thigh Pain Descriptors / Indicators: Spasm;Grimacing Pain Intervention(s): Limited activity within patient's tolerance;Repositioned;Monitored during session    Home Living Family/patient expects to be discharged to:: Private residence Living Arrangements: Alone Available Help at Discharge: Friend(s) Type of Home: House Home Access: Ramped entrance     Home Layout: One level Home Equipment: Wheelchair - Manufacturing systems engineer - built in Additional Comments: works as professor at [employer]    Prior Function Level of Independence: Independent with assistive device(s)         Comments: Independent with transfers to w/c. Independent at w/c level.     Hand Dominance        Extremity/Trunk Assessment   Upper Extremity Assessment Upper Extremity Assessment: Defer to OT evaluation    Lower Extremity Assessment Lower Extremity Assessment: Generalized weakness;RLE deficits/detail;LLE deficits/detail RLE Deficits / Details: AKA LLE Deficits / Details: Limited by pain and use of Darco shoe LLE Sensation: history of peripheral neuropathy       Communication   Communication: No difficulties  Cognition Arousal/Alertness: Awake/alert Behavior During Therapy: WFL for tasks assessed/performed Overall Cognitive Status: Within Functional Limits for tasks assessed  General Comments      Exercises     Assessment/Plan    PT Assessment Patient needs continued PT services  PT Problem List Decreased strength;Decreased activity tolerance;Decreased balance;Decreased mobility;Impaired sensation;Obesity          PT Treatment Interventions DME instruction;Functional mobility  training;Therapeutic activities;Therapeutic exercise;Balance training;Patient/family education    PT Goals (Current goals can be found in the Care Plan section)  Acute Rehab PT Goals Patient Stated Goal: get strength back PT Goal Formulation: With patient Time For Goal Achievement: 11/10/16 Potential to Achieve Goals: Good    Frequency Min 3X/week   Barriers to discharge Decreased caregiver support Lives alone    Co-evaluation PT/OT/SLP Co-Evaluation/Treatment: Yes Reason for Co-Treatment: For patient/therapist safety PT goals addressed during session: Mobility/safety with mobility         End of Session Equipment Utilized During Treatment: Gait belt Activity Tolerance: Patient limited by fatigue Patient left: in chair;with call bell/phone within reach Nurse Communication: Mobility status         Time: 1122-1205 PT Time Calculation (min) (ACUTE ONLY): 43 min   Charges:   PT Evaluation $PT Eval Moderate Complexity: 1 Procedure     PT G CodesAngelina Ok:        Zaidin Blyden W Maycok 10/27/2016, 1:23 PM [employer] CorporationCary Cas Tracz PT [phone]

## 2016-10-28 DIAGNOSIS — F321 Major depressive disorder, single episode, moderate: Secondary | ICD-10-CM

## 2016-10-28 DIAGNOSIS — F411 Generalized anxiety disorder: Secondary | ICD-10-CM

## 2016-10-28 LAB — GLUCOSE, CAPILLARY
GLUCOSE-CAPILLARY: 116 mg/dL — AB (ref 65–99)
GLUCOSE-CAPILLARY: 125 mg/dL — AB (ref 65–99)
Glucose-Capillary: 140 mg/dL — ABNORMAL HIGH (ref 65–99)
Glucose-Capillary: 124 mg/dL — ABNORMAL HIGH (ref 65–99)
Glucose-Capillary: 135 mg/dL — ABNORMAL HIGH (ref 65–99)
Glucose-Capillary: 106 mg/dL — ABNORMAL HIGH (ref 65–99)

## 2016-10-28 LAB — CULTURE, BLOOD (ROUTINE X 2)
CULTURE: NO GROWTH
Culture: NO GROWTH

## 2016-10-28 LAB — LACTOFERRIN, FECAL, QUALITATIVE: LACTOFERRIN, FECAL, QUAL: NEGATIVE

## 2016-10-28 MED ORDER — SODIUM CHLORIDE 0.9 % IV SOLN
INTRAVENOUS | Status: DC
  Administered 2016-10-28 – 2016-10-29 (×2): via INTRAVENOUS

## 2016-10-28 MED ORDER — ALUM & MAG HYDROXIDE-SIMETH 200-200-20 MG/5ML PO SUSP
15.0000 mL | Freq: Four times a day (QID) | ORAL | Status: DC | PRN
  Administered 2016-10-28: 15 mL via ORAL
  Filled 2016-10-28: qty 30

## 2016-10-28 MED ORDER — INSULIN ASPART 100 UNIT/ML ~~LOC~~ SOLN
0.0000 [IU] | Freq: Three times a day (TID) | SUBCUTANEOUS | Status: DC
  Administered 2016-10-29 – 2016-10-30 (×4): 1 [IU] via SUBCUTANEOUS

## 2016-10-28 MED ORDER — NYSTATIN 100000 UNIT/GM EX POWD
Freq: Two times a day (BID) | CUTANEOUS | Status: DC
  Administered 2016-10-28 – 2016-11-03 (×10): via TOPICAL
  Administered 2016-11-04: 1 g via TOPICAL
  Administered 2016-11-04 – 2016-11-08 (×6): via TOPICAL
  Filled 2016-10-28: qty 15

## 2016-10-28 NOTE — Progress Notes (Addendum)
PROGRESS NOTE    Jeffery BoydenKenneth [name]  BMW:[id number] DOB: [birth year] DOA: 10/23/2016 PCP: [name],[name], MD   Brief Narrative:  66 y.o. male PMHx Liver cirrhosis with Ascites, Esophageal varices, Portal Hypertensive gastropathy, Hemolytic anemia, Thrombocytopenia, DM type II uncontrolled with complications, S/P right AKA for DM foot infection,   Presented to the emergency room with complaints of abdominal pain and frequent diarrhea. Patient has been tested for hepatitis and screening was negative. He is not an alcoholic and has no previous history of heavy alcohol use. During the last few weeks he's been having frequent loose stools that were worsening in frequency over the last 2 - 3 days  to the point that he is unable to eat because food intake causes him to run to the bathroom continuously and .he has become severely weak. He also c/o having nausea and dry heaves, feeling of fullness in the stomack that despite diarrhea is not getting better, but feeling worse. Patient also reported of becoming very emotional and crying a lot without any apparent reason  He has been treated with tetracycline ( now the second round) for nearly 4 weeks for non healing diabetic ulcer of the left foot and erythema is still persent and he dose not feel that the wound is healing, although erythema has improved  ED Course: In the ED patient was found to be hypotensive with blood pressure 89/49 that responded well to IV fluids with improvement and reading to 114/61 mmHg, was mildly febrile picture of 99.32F CBC revealed hemoglobin of 10.5 which is significantly lower than the previous hemoglobin in November 2017 stashed 12.5 g/dL, platelets were [id number],000 and white blood cells count was normal CMP revealed mildly elevated hyponatremia-132, slightly elevated potassium 5.40, glucose of 157, and abnormal creatinine of 2.49 and DM 39, which is new finding. He also has elevated AST-41 and hyperbilirubinemia-3.2 Lactic Acid  was elevated at 2.77 FEcal occult blood was positive, C.Diff PCR is still pending   Subjective: 2/10  A/O 4. Sitting very comfortably except for some spasms in right thigh. Patient feels secondary to sitting in chair for too long. On 2/9    Assessment & Plan:   Active Problems:   Hemolytic anemia (HCC)   Cirrhosis of liver with ascites (HCC)   Esophageal varices without bleeding (HCC)   Portal hypertensive gastropathy   C. difficile colitis   Diarrhea   Osteomyelitis (HCC)   Noninfectious gastroenteritis   Gastrointestinal hemorrhage   Controlled diabetes mellitus type 2 with complications (HCC)   Osteomyelitis of left foot (HCC)   Acute renal failure (HCC)   Abdominal pain   Nausea, abdominal pain, Diarrhea  - symptoms have been worsening lately -C. difficile PCR negative  -D5-0.9% saline 5175ml/hr -Strict in and out since admission + 8.8 L -Daily weight Filed Weights   10/26/16 0318 10/27/16 1433 10/28/16 0500  Weight: 104.4 kg (230 lb 1.6 oz) 104.3 kg (229 lb 14.7 oz) 104.9 kg (231 lb 3.2 oz)   Colitis -Most likely inflammatory/reactive given that patient has negative leukocytosis, negative fever. -Stool culture pending  GI bleed with blood loss anemia  - patient with h/o hemolytic/G6PD anemia -Currently hemodynamically stable -Transfuse for hemoglobin<7  Recent Labs Lab 10/23/16 1138 10/23/16 2340 10/24/16 1243 10/26/16 0252 10/27/16 0948  HGB 10.5* 10.1* 11.2* 9.2* 9.8*    Thrombocytopenia - currently stable -Most likely secondary to his osteomyelitis/cellulitis -Should recover Continue to monitor. Obtain peripheral blood smear (will need to contact pathology in the a.m. for read),  -transfuse  platelets if level falls critically and consider oral  steroids (ITP?). S/P surgical amputation LLE first metatarsal monitor to see if platelets begin to rise -2/9 slowly trending up  Liver cirrhosis with known history of ascites -Hepatitis testing in  outpatient setting was negative -Abdominal CT: Cholelithiasis/colitis see results below   DM type II controlled with complications -2/6 Hemoglobin A1c= 5.8 -Lipid panel: Within ADA guidelines -Hold oral meds,  -sensitive SSI  Osteomyelitis/cellulitis Ulcer of the Lt foot -Left foot MRI C/W osteomyelitis - IV doxycycline and ZOsyn -[facility] will see patient -S/P LLE first metatarsal resection see results below  Yeast infection -Patient with significant yeast infection perianal area/lower abdomen. IV Diflucan with nystatin powder   Acute renal failure (baseline Cr~1.3) Continue IVF and monitor renal function Lab Results  Component Value Date   CREATININE 1.81 (H) 10/27/2016   CREATININE 1.77 (H) 10/26/2016   CREATININE 2.16 (H) 10/23/2016  -See diarrhea      DVT prophylaxis: SCD Code Status: Full Family Communication: None Disposition Plan: PT/OT recommends SNF: When cleared by orthopedic surgery   Consultants:  Dr[facility]   Procedures/Significant Events:  2/5 CT abdomen pelvis w contrast: -Cirrhosis with large volume ascites. -Mild relative bowel wall thickening throughout the colon may reflect reactive edema secondary to ascites versus mild colitis. -Cholelithiasis. -Bilateral renal cysts. 2/6 MRI left foot W contrast: -Previous amputation of the right first toe.  -Soft tissue edema consistent with cellulitis and ulceration over the first metatarsal head. Edema in the plantar musculature.  -Focal bone erosion and marrow signal changes at the distal first metatarsal head and involving the proximal phalangeal stump likely indicates focal osteomyelitis. 2/6 ultrasound guided paracentesis 3.3 L removed 2/7 Left first ray amputation (transmetatarsal, single ray )   VENTILATOR SETTINGS:    Cultures 2/5 blood NGTD 2/5 C. difficile negative 2/8 stool culture pending   Antimicrobials: Anti-infectives    Start      Stop   10/24/16 0600  piperacillin-tazobactam (ZOSYN) IVPB 3.375 g    Comments:  Zosyn 3.375 g IV q8h for CrCl > 10 mL/min       10/23/16 2330  doxycycline (VIBRAMYCIN) 100 mg in dextrose 5 % 250 mL IVPB         10/23/16 2330  piperacillin-tazobactam (ZOSYN) IVPB 3.375 g  Status:  Discontinued     10/24/16 0130       Devices    LINES / TUBES:      Continuous Infusions: . dextrose 5 % and 0.9% NaCl 75 mL/hr at 10/28/16 1333     Objective: Vitals:   10/28/16 0742 10/28/16 0800 10/28/16 1000 10/28/16 1217  BP: (!) 101/55 (!) 99/59 (!) 97/53   Pulse:  60 68   Resp:  15 20   Temp: 97.4 F (36.3 C)   97.2 F (36.2 C)  TempSrc: Oral   Oral  SpO2:  95% 97%   Weight:        Intake/Output Summary (Last 24 hours) at 10/28/16 1432 Last data filed at 10/28/16 0800  Gross per 24 hour  Intake             1400 ml  Output              200 ml  Net             1200 ml   Filed Weights   10/26/16 0318 10/27/16 1433 10/28/16 0500  Weight: 104.4 kg (230 lb 1.6 oz) 104.3 kg (  229 lb 14.7 oz) 104.9 kg (231 lb 3.2 oz)    Examination:  General: A/O 4, NAD, NAD, No acute respiratory distress Eyes: negative scleral hemorrhage, negative anisocoria, negative icterus ENT: Negative Runny nose, negative gingival bleeding, Neck:  Negative scars, masses, torticollis, lymphadenopathy, JVD Lungs: Clear to auscultation bilaterally without wheezes or crackles Cardiovascular: Regular rate and rhythm without murmur gallop or rub normal S1 and S2 Abdomen: negative abdominal pain, nondistended, positive soft, bowel sounds, no rebound, no ascites, no appreciable mass Extremities: right AKA, left foot swollen, covered and clean did not take down dressing. Minimal drainage from wound VAC.   Skin: Perianal moderate to severe yeast infection Psychiatric:  Negative depression, negative anxiety, negative fatigue, negative mania  Central nervous system:  Cranial nerves II through XII intact, tongue/uvula  midline, all extremities muscle strength 5/5, sensation intact throughout,negative dysarthria, negative expressive aphasia, negative receptive aphasia. .     Data Reviewed: Care during the described time interval was provided by me .  I have reviewed this patient's available data, including medical history, events of note, physical examination, and all test results as part of my evaluation. I have personally reviewed and interpreted all radiology studies.  CBC:  Recent Labs Lab 10/23/16 1138 10/23/16 2340 10/24/16 1243 10/26/16 0252 10/27/16 0948  WBC 9.5 7.7  --  4.5 6.4  NEUTROABS 7.5  --   --   --   --   HGB 10.5* 10.1* 11.2* 9.2* 9.8*  HCT 30.2* 29.1* 32.8* 26.9* 29.0*  MCV 99.3 99.0  --  99.6 100.0  PLT 115* 92*  --  75* 92*   Basic Metabolic Panel:  Recent Labs Lab 10/23/16 1138 10/23/16 2340 10/26/16 0252 10/27/16 0948  NA 132* 132* 132* 134*  K 5.4* 4.9 4.3 4.3  CL 105 106 106 108  CO2 19* 18* 19* 18*  GLUCOSE 157* 105* 131* 131*  BUN 39* 39* 30* 24*  CREATININE 2.49* 2.16* 1.77* 1.81*  CALCIUM 8.6* 8.1* 7.9* 8.0*  MG  --   --   --  1.7   GFR: Estimated Creatinine Clearance: 51 mL/min (by C-G formula based on SCr of 1.81 mg/dL (H)). Liver Function Tests:  Recent Labs Lab 10/23/16 1138 10/23/16 2340 10/26/16 0252  AST 45* 42* 61*  ALT 26 24 26   ALKPHOS 76 62 52  BILITOT 3.2* 2.5* 1.4*  PROT 7.1 6.6 6.0*  ALBUMIN 2.1* 1.9* 1.5*   No results for input(s): LIPASE, AMYLASE in the last 168 hours.  Recent Labs Lab 10/23/16 1241  AMMONIA 18   Coagulation Profile:  Recent Labs Lab 10/23/16 1138  INR 1.96   Cardiac Enzymes: No results for input(s): CKTOTAL, CKMB, CKMBINDEX, TROPONINI in the last 168 hours. BNP (last 3 results) No results for input(s): PROBNP in the last 8760 hours. HbA1C: No results for input(s): HGBA1C in the last 72 hours. CBG:  Recent Labs Lab 10/27/16 1952 10/27/16 2340 10/28/16 0344 10/28/16 0742 10/28/16 1216    GLUCAP 141* 131* 140* 116* 125*   Lipid Profile: No results for input(s): CHOL, HDL, LDLCALC, TRIG, CHOLHDL, LDLDIRECT in the last 72 hours. Thyroid Function Tests: No results for input(s): TSH, T4TOTAL, FREET4, T3FREE, THYROIDAB in the last 72 hours. Anemia Panel: No results for input(s): VITAMINB12, FOLATE, FERRITIN, TIBC, IRON, RETICCTPCT in the last 72 hours. Urine analysis:    Component Value Date/Time   COLORURINE AMBER (A) 10/24/2016 0456   APPEARANCEUR HAZY (A) 10/24/2016 0456   LABSPEC 1.017 10/24/2016 0456   PHURINE  5.0 10/24/2016 0456   GLUCOSEU NEGATIVE 10/24/2016 0456   HGBUR NEGATIVE 10/24/2016 0456   BILIRUBINUR NEGATIVE 10/24/2016 0456   KETONESUR NEGATIVE 10/24/2016 0456   PROTEINUR NEGATIVE 10/24/2016 0456   NITRITE NEGATIVE 10/24/2016 0456   LEUKOCYTESUR NEGATIVE 10/24/2016 0456   Sepsis Labs: @LABRCNTIP (procalcitonin:4,lacticidven:4)  ) Recent Results (from the past 240 hour(s))  Culture, blood (Routine x 2)     Status: None   Collection Time: 10/23/16 11:40 AM  Result Value Ref Range Status   Specimen Description BLOOD RIGHT ANTECUBITAL  Final   Special Requests   Final    BOTTLES DRAWN AEROBIC AND ANAEROBIC 10CC AER 5CC ANA   Culture NO GROWTH 5 DAYS  Final   Report Status 10/28/2016 FINAL  Final  Culture, blood (Routine x 2)     Status: None   Collection Time: 10/23/16 11:54 AM  Result Value Ref Range Status   Specimen Description BLOOD RIGHT HAND  Final   Special Requests BOTTLES DRAWN AEROBIC ONLY 10CC  Final   Culture NO GROWTH 5 DAYS  Final   Report Status 10/28/2016 FINAL  Final  C difficile quick scan w PCR reflex     Status: None   Collection Time: 10/23/16  5:47 PM  Result Value Ref Range Status   C Diff antigen NEGATIVE NEGATIVE Final   C Diff toxin NEGATIVE NEGATIVE Final   C Diff interpretation No C. difficile detected.  Final  Urine culture     Status: Abnormal   Collection Time: 10/24/16  4:56 AM  Result Value Ref Range Status    Specimen Description URINE, RANDOM  Final   Special Requests NONE  Final   Culture <10,000 COLONIES/mL INSIGNIFICANT GROWTH (A)  Final   Report Status 10/25/2016 FINAL  Final  Culture, body fluid-bottle     Status: None (Preliminary result)   Collection Time: 10/24/16 11:10 AM  Result Value Ref Range Status   Specimen Description PERITONEAL  Final   Special Requests NONE  Final   Culture NO GROWTH 4 DAYS  Final   Report Status PENDING  Incomplete  Gram stain     Status: None   Collection Time: 10/24/16 11:10 AM  Result Value Ref Range Status   Specimen Description PERITONEAL  Final   Special Requests NONE  Final   Gram Stain   Final    RARE WBC PRESENT,BOTH PMN AND MONONUCLEAR NO ORGANISMS SEEN    Report Status 10/24/2016 FINAL  Final  MRSA PCR Screening     Status: None   Collection Time: 10/24/16  5:12 PM  Result Value Ref Range Status   MRSA by PCR NEGATIVE NEGATIVE Final    Comment:        The GeneXpert MRSA Assay (FDA approved for NASAL specimens only), is one component of a comprehensive MRSA colonization surveillance program. It is not intended to diagnose MRSA infection nor to guide or monitor treatment for MRSA infections.   Anaerobic culture     Status: None (Preliminary result)   Collection Time: 10/25/16  5:36 PM  Result Value Ref Range Status   Specimen Description WOUND LEFT TOE  Final   Special Requests ANAEROBIC SWAB ONLY  Final   Gram Stain   Final    ABUNDANT WBC PRESENT, PREDOMINANTLY PMN RARE GRAM POSITIVE COCCI IN PAIRS    Culture   Final    NO ANAEROBES ISOLATED; CULTURE IN PROGRESS FOR 5 DAYS   Report Status PENDING  Incomplete  Radiology Studies: No results found.      Scheduled Meds: . aspirin EC  81 mg Oral Daily  . atorvastatin  40 mg Oral Daily  . brimonidine  1 drop Both Eyes Daily  . doxycycline (VIBRAMYCIN) IV  100 mg Intravenous Q12H  . insulin aspart  0-9 Units Subcutaneous Q4H  . nadolol  40 mg Oral Daily   . piperacillin-tazobactam (ZOSYN)  IV  3.375 g Intravenous Q8H   Continuous Infusions: . dextrose 5 % and 0.9% NaCl 75 mL/hr at 10/28/16 1333     LOS: 5 days    Time spent: 40 minutes    Emberli Ballester, Roselind Messier, MD [facility] Pager [phone]   If 7PM-7AM, please contact night-coverage [email or web address] Password [email or web address] 10/28/2016, 2:32 PM

## 2016-10-29 DIAGNOSIS — R400 Somnolence: Secondary | ICD-10-CM

## 2016-10-29 DIAGNOSIS — B372 Candidiasis of skin and nail: Secondary | ICD-10-CM | POA: Insufficient documentation

## 2016-10-29 DIAGNOSIS — L22 Diaper dermatitis: Secondary | ICD-10-CM

## 2016-10-29 DIAGNOSIS — F321 Major depressive disorder, single episode, moderate: Secondary | ICD-10-CM | POA: Insufficient documentation

## 2016-10-29 DIAGNOSIS — E440 Moderate protein-calorie malnutrition: Secondary | ICD-10-CM | POA: Diagnosis present

## 2016-10-29 DIAGNOSIS — F411 Generalized anxiety disorder: Secondary | ICD-10-CM | POA: Diagnosis present

## 2016-10-29 LAB — CBC
HEMATOCRIT: 22.3 % — AB (ref 39.0–52.0)
HEMOGLOBIN: 7.8 g/dL — AB (ref 13.0–17.0)
MCH: 34.2 pg — ABNORMAL HIGH (ref 26.0–34.0)
MCHC: 35 g/dL (ref 30.0–36.0)
MCV: 97.8 fL (ref 78.0–100.0)
Platelets: 71 10*3/uL — ABNORMAL LOW (ref 150–400)
RBC: 2.28 MIL/uL — ABNORMAL LOW (ref 4.22–5.81)
RDW: 14.5 % (ref 11.5–15.5)
WBC: 5.2 10*3/uL (ref 4.0–10.5)

## 2016-10-29 LAB — GLUCOSE, CAPILLARY
GLUCOSE-CAPILLARY: 127 mg/dL — AB (ref 65–99)
GLUCOSE-CAPILLARY: 144 mg/dL — AB (ref 65–99)
Glucose-Capillary: 102 mg/dL — ABNORMAL HIGH (ref 65–99)
Glucose-Capillary: 106 mg/dL — ABNORMAL HIGH (ref 65–99)

## 2016-10-29 LAB — CULTURE, BODY FLUID-BOTTLE: CULTURE: NO GROWTH

## 2016-10-29 LAB — BASIC METABOLIC PANEL
Anion gap: 6 (ref 5–15)
BUN: 22 mg/dL — AB (ref 6–20)
CHLORIDE: 107 mmol/L (ref 101–111)
CO2: 21 mmol/L — AB (ref 22–32)
CREATININE: 1.81 mg/dL — AB (ref 0.61–1.24)
Calcium: 7.9 mg/dL — ABNORMAL LOW (ref 8.9–10.3)
GFR calc Af Amer: 43 mL/min — ABNORMAL LOW (ref >60)
GFR calc non Af Amer: 37 mL/min — ABNORMAL LOW (ref >60)
Glucose, Bld: 123 mg/dL — ABNORMAL HIGH (ref 65–99)
Potassium: 4.3 mmol/L (ref 3.5–5.1)
Sodium: 134 mmol/L — ABNORMAL LOW (ref 135–145)

## 2016-10-29 LAB — AMMONIA: Ammonia: 13 umol/L (ref 9–35)

## 2016-10-29 LAB — MAGNESIUM: Magnesium: 1.6 mg/dL — ABNORMAL LOW (ref 1.7–2.4)

## 2016-10-29 LAB — CULTURE, BODY FLUID W GRAM STAIN -BOTTLE

## 2016-10-29 MED ORDER — NALOXONE HCL 0.4 MG/ML IJ SOLN
0.4000 mg | INTRAMUSCULAR | Status: DC | PRN
  Filled 2016-10-29: qty 1

## 2016-10-29 MED ORDER — MAGNESIUM SULFATE 50 % IJ SOLN
3.0000 g | Freq: Once | INTRAVENOUS | Status: AC
  Administered 2016-10-29: 3 g via INTRAVENOUS
  Filled 2016-10-29: qty 6

## 2016-10-29 MED ORDER — METHOCARBAMOL 500 MG PO TABS
250.0000 mg | ORAL_TABLET | Freq: Three times a day (TID) | ORAL | Status: DC | PRN
  Administered 2016-10-30 – 2016-11-08 (×2): 250 mg via ORAL
  Filled 2016-10-29 (×2): qty 1

## 2016-10-29 MED ORDER — MORPHINE SULFATE (PF) 2 MG/ML IV SOLN
0.5000 mg | INTRAVENOUS | Status: DC | PRN
  Administered 2016-10-30 – 2016-10-31 (×2): 0.5 mg via INTRAVENOUS
  Filled 2016-10-29 (×2): qty 1

## 2016-10-29 MED ORDER — FLUCONAZOLE IN SODIUM CHLORIDE 100-0.9 MG/50ML-% IV SOLN
100.0000 mg | INTRAVENOUS | Status: DC
  Administered 2016-10-30: 100 mg via INTRAVENOUS
  Filled 2016-10-29: qty 50

## 2016-10-29 MED ORDER — FLUCONAZOLE IN SODIUM CHLORIDE 200-0.9 MG/100ML-% IV SOLN
200.0000 mg | Freq: Once | INTRAVENOUS | Status: AC
  Administered 2016-10-29: 200 mg via INTRAVENOUS
  Filled 2016-10-29: qty 100

## 2016-10-29 MED ORDER — METHOCARBAMOL 500 MG PO TABS: 500.0000 mg | ORAL_TABLET | Freq: Three times a day (TID) | ORAL | Status: DC | PRN

## 2016-10-29 MED ORDER — CYCLOBENZAPRINE HCL 10 MG PO TABS
5.0000 mg | ORAL_TABLET | Freq: Once | ORAL | Status: AC
  Administered 2016-10-29: 5 mg via ORAL
  Filled 2016-10-29: qty 1

## 2016-10-29 MED ORDER — PAROXETINE HCL 20 MG PO TABS
20.0000 mg | ORAL_TABLET | Freq: Every day | ORAL | Status: DC
  Administered 2016-10-29 – 2016-11-08 (×5): 20 mg via ORAL
  Filled 2016-10-29 (×9): qty 1

## 2016-10-29 NOTE — Progress Notes (Signed)
[facility] Patient will be transferred to room 24.

## 2016-10-29 NOTE — Progress Notes (Signed)
Patient requesting pain medication again, but its too early. Patient appears to be in pain, facial grimacing and turning from side to side again. Patient repositioned in bed and emotional support given. Will re-assess when the pain medication can be given again.

## 2016-10-29 NOTE — Progress Notes (Signed)
Concerned about patient: While giving bed bath RN noted strong smell of yeast under patients skin folds including abdomen, arms, groin, perineum, and near anus. All areas are also beefy red in color. Perineum, groin, and buttocks also has breakdown and open areas. Patient states the wound on his right hip/buttucks and abrasion on his left hip/buttocks are from falling at home in the shower. The skin is peeled away and he also has excoriated markings all the way down both sides where something scratched him on his way down. Patient has been on IV antibiotics for weeks now d/t osteomyelitis of his left foot. Patient has also been having loose stools. Patient continues on D5NS @ 4775ml/hr. Patient able to eat. Noticed his Albumin level is 1.5. Called provider on call with concerns and requested an order for Prostat for the low albumin, Diflucan IV for the yeast, Nystatin powder for the yeast, Probiotic for the yeast, and to change IV fluids to NS to decrease the sugar. Patient continues to have a wound on his left foot with a wound vac attached. 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  Dr. Kristin BruinsHugemeyer gave me an order for Nystatin powder and changed the IV fluids as requested but did not feel comfortable with the other requests. States she will let the primary care doctor know for tomorrow. Nystatin powder applied as ordered and IV fluids changed. Will continue to monitor.

## 2016-10-29 NOTE — Progress Notes (Signed)
Patient continues to complain of severe 9/10 pain on right side, now in right stump, previously in right hip/low back area. Patient describes it as feeling like a muscle spasm. Patient in obvious distress, can't seem to get comfortable, tossing and turning in the bed. Patient states he hasn't had pain like this before. Administered Percocet as ordered and called provider on call for a muscle relaxer. Flexeril 5mg  was ordered and given. Will continue to monitor.

## 2016-10-29 NOTE — Progress Notes (Signed)
1st attempt to call report. Receiving RN giving medications, left number and he will call back.

## 2016-10-29 NOTE — Plan of Care (Signed)
Problem: Health Behavior/Discharge Planning: Goal: Ability to manage health-related needs will improve Outcome: Not Progressing Patient not making any attempt to learn about what he needs to do to get better  Problem: Pain Managment: Goal: General experience of comfort will improve Outcome: Not Progressing Pt has c/o pain all shift  Problem: Nutrition: Goal: Adequate nutrition will be maintained Outcome: Not Progressing Nutrition consult entered

## 2016-10-29 NOTE — Progress Notes (Signed)
Patient states he hasn't eaten anything all day because he was NPO. He then said they changed the order at dinner but no one brought him a tray. Offered him the [employer] meals available on the floor and he stated he was having heartburn. Called provider on call and obtained an order for Maalox/Mylanta which patient said helped. Educated patient on the importance of protein and wound healing as well as protein and fluid leaving the vascular space and accumulating in his abdomen again (pt had paracentesis this admission). Patient was reluctant but agreed to try the chicken without the sauce or noodle. Patient ate it without any problems. Will request nutrition consult.

## 2016-10-29 NOTE — Progress Notes (Signed)
PROGRESS NOTE    Jeffery [name]  ZOX:[id number] DOB: 11-29-1950 DOA: 10/23/2016 PCP: Jeffery [name]   Brief Narrative:  66 y.o. male PMHx Liver cirrhosis with Ascites, Esophageal varices, Portal Hypertensive gastropathy, Hemolytic anemia, Thrombocytopenia, DM type II uncontrolled with complications, S/P right AKA for DM foot infection,   Presented to the emergency room with complaints of abdominal pain and frequent diarrhea. Patient has been tested for hepatitis and screening was negative. He is not an alcoholic and has no previous history of heavy alcohol use. During the last few weeks he's been having frequent loose stools that were worsening in frequency over the last 2 - 3 days  to the point that he is unable to eat because food intake causes him to run to the bathroom continuously and .he has become severely weak. He also c/o having nausea and dry heaves, feeling of fullness in the stomack that despite diarrhea is not getting better, but feeling worse. Patient also reported of becoming very emotional and crying a lot without any apparent reason  He has been treated with tetracycline ( now the second round) for nearly 4 weeks for non healing diabetic ulcer of the left foot and erythema is still persent and he dose not feel that the wound is healing, although erythema has improved  ED Course: In the ED patient was found to be hypotensive with blood pressure 89/49 that responded well to IV fluids with improvement and reading to 114/61 mmHg, was mildly febrile picture of 99.51F CBC revealed hemoglobin of 10.5 which is significantly lower than the previous hemoglobin in November 2017 stashed 12.5 g/dL, platelets were [id number],000 and white blood cells count was normal CMP revealed mildly elevated hyponatremia-132, slightly elevated potassium 5.40, glucose of 157, and abnormal creatinine of 2.49 and DM 39, which is new finding. He also has elevated AST-41 and hyperbilirubinemia-3.2 Lactic Acid  was elevated at 2.77 FEcal occult blood was positive, C.Diff PCR is still pending   Subjective: 2/11  A/O 4. Sitting very comfortably in bed. States has not been out of the bed today but increase to sitting in chair for evening meal.     Assessment & Plan:   Active Problems:   Hemolytic anemia (HCC)   Cirrhosis of liver with ascites (HCC)   Esophageal varices without bleeding (HCC)   Portal hypertensive gastropathy   C. difficile colitis   Diarrhea   Osteomyelitis (HCC)   Noninfectious gastroenteritis   Gastrointestinal hemorrhage   Controlled diabetes mellitus type 2 with complications (HCC)   Osteomyelitis of left foot (HCC)   Acute renal failure (HCC)   Abdominal pain   Nausea, abdominal pain, Diarrhea  - symptoms have been worsening lately -C. difficile PCR negative  -0.9% saline 5575ml/hr -Strict in and out since admission + 12.5 L -Daily weight Filed Weights   10/27/16 1433 10/28/16 0500 10/29/16 0500  Weight: 104.3 kg (229 lb 14.7 oz) 104.9 kg (231 lb 3.2 oz) 107.2 kg (236 lb 4.8 oz)   Colitis -Most likely inflammatory/reactive given that patient has negative leukocytosis, negative fever. -Stool culture pending, But lactoferrin negative -Diarrhea resolved  Candidiasis -2/11 Diflucan IV 200 mg 1: Then 100 mg daily  GI bleed with blood loss anemia  - patient with h/o hemolytic/G6PD anemia -Currently hemodynamically stable -Transfuse for hemoglobin<7 Recent Labs Lab 10/23/16 1138 10/23/16 2340 10/24/16 1243 10/26/16 0252 10/27/16 0948 10/29/16 0226  HGB 10.5* 10.1* 11.2* 9.2* 9.8* 7.8*  -occult blood pending  Thrombocytopenia - currently stable -Most likely secondary to  his osteomyelitis/cellulitis -Verified with Hematology Jeffery [name] that PBS from 2/6 will be placed on pathologist desk for read on 2/12.(will need to contact pathology in the a.m. for read), - Should recover Continue to monitor.    -transfuse platelets if level falls  critically and consider oral  steroids (ITP?). S/P surgical amputation LLE first metatarsal. -Platelets slightly decreased today. No overt sign of bleeding.  Liver cirrhosis with known history of ascites -Hepatitis testing in outpatient setting was negative -Abdominal CT: Cholelithiasis/colitis see results below  -2/11 Ammonia= 13  DM type II controlled with complications -2/6 Hemoglobin A1c= 5.8 -Lipid panel: Within ADA guidelines -Hold oral meds,  -sensitive SSI  Osteomyelitis/cellulitis Ulcer of the Lt foot -Left foot MRI C/W osteomyelitis - IV doxycycline and ZOsyn -[name] will see patient -S/P LLE first metatarsal resection see results below  Acute renal failure (baseline Cr~1.3) Continue IVF and monitor renal function Lab Results  Component Value Date   CREATININE 1.81 (H) 10/29/2016   CREATININE 1.81 (H) 10/27/2016   CREATININE 1.77 (H) 10/26/2016  -See diarrhea  Hypomagnesemia -Magnesium 3 g  Hypocalcemia -2/8 albumin= 1.5  -Corrected calcium=10  Depression/Anxiety -2/11 start Paxil 20 mg daily  Altered mental status -Patient appears to be very sensitive to pain medication/benzodiazepine. Have decreased patient's medication. -Patient received one dose of Narcan. -Would be very judicious in administering sedating medication i.e. narcotics, benzodiazepines, muscle relaxants.  Malnutrition  -nutrition consult placed       DVT prophylaxis: SCD Code Status: Full Family Communication: None Disposition Plan: PT/OT recommends SNF: When cleared by orthopedic surgery   Consultants:  [name]   Procedures/Significant Events:  2/5 CT abdomen pelvis w contrast: -Cirrhosis with large volume ascites. -Mild relative bowel wall thickening throughout the colon may reflect reactive edema secondary to ascites versus mild colitis. -Cholelithiasis. -Bilateral renal cysts. 2/6 MRI left foot W contrast: -Previous  amputation of the right first toe.  -Soft tissue edema consistent with cellulitis and ulceration over the first metatarsal head. Edema in the plantar musculature.  -Focal bone erosion and marrow signal changes at the distal first metatarsal head and involving the proximal phalangeal stump likely indicates focal osteomyelitis. 2/6 ultrasound guided paracentesis 3.3 L removed 2/7 Left first ray amputation (transmetatarsal, single ray )   VENTILATOR SETTINGS:    Cultures 2/5 blood NGTD 2/5 C. difficile negative 2/8 stool culture pending   Antimicrobials: Anti-infectives    Start     Stop   10/24/16 0600  piperacillin-tazobactam (ZOSYN) IVPB 3.375 g    Comments:  Zosyn 3.375 g IV q8h for CrCl > 10 mL/min       10/23/16 2330  doxycycline (VIBRAMYCIN) 100 mg in dextrose 5 % 250 mL IVPB         10/23/16 2330  piperacillin-tazobactam (ZOSYN) IVPB 3.375 g  Status:  Discontinued     10/24/16 0130       Devices    LINES / TUBES:      Continuous Infusions: . sodium chloride 75 mL/hr at 10/29/16 0816     Objective: Vitals:   10/28/16 2300 10/29/16 0300 10/29/16 0500 10/29/16 0800  BP: 111/64 (!) 113/97  103/71  Pulse: 61 68  67  Resp: 14 (!) 24  20  Temp: 97.3 F (36.3 C) 97.4 F (36.3 C)  97.7 F (36.5 C)  TempSrc: Oral Oral  Oral  SpO2: 98% 98%  99%  Weight:   107.2 kg (236 lb 4.8 oz)  Intake/Output Summary (Last 24 hours) at 10/29/16 0818 Last data filed at 10/29/16 0700  Gross per 24 hour  Intake           2242.5 ml  Output                0 ml  Net           2242.5 ml   Filed Weights   10/27/16 1433 10/28/16 0500 10/29/16 0500  Weight: 104.3 kg (229 lb 14.7 oz) 104.9 kg (231 lb 3.2 oz) 107.2 kg (236 lb 4.8 oz)    Examination:  General: A/O 4, NAD, NAD, No acute respiratory distress Eyes: negative scleral hemorrhage, negative anisocoria, negative icterus ENT: Negative Runny nose, negative gingival bleeding, Neck:  Negative scars, masses,  torticollis, lymphadenopathy, JVD Lungs: Clear to auscultation bilaterally without wheezes or crackles Cardiovascular: Regular rate and rhythm without murmur gallop or rub normal S1 and S2 Abdomen: negative abdominal pain, nondistended, positive soft, bowel sounds, no rebound, no ascites, no appreciable mass Extremities: right AKA, left foot swollen, covered and clean did not take down dressing. Minimal drainage from wound VAC.   Skin:  Psychiatric:  Negative depression, negative anxiety, negative fatigue, negative mania  Central nervous system:  Cranial nerves II through XII intact, tongue/uvula midline, all extremities muscle strength 5/5, sensation intact throughout,negative dysarthria, negative expressive aphasia, negative receptive aphasia. .     Data Reviewed: Care during the described time interval was provided by me .  I have reviewed this patient's available data, including medical history, events of note, physical examination, and all test results as part of my evaluation. I have personally reviewed and interpreted all radiology studies.  CBC:  Recent Labs Lab 10/23/16 1138 10/23/16 2340 10/24/16 1243 10/26/16 0252 10/27/16 0948 10/29/16 0226  WBC 9.5 7.7  --  4.5 6.4 5.2  NEUTROABS 7.5  --   --   --   --   --   HGB 10.5* 10.1* 11.2* 9.2* 9.8* 7.8*  HCT 30.2* 29.1* 32.8* 26.9* 29.0* 22.3*  MCV 99.3 99.0  --  99.6 100.0 97.8  PLT 115* 92*  --  75* 92* 71*   Basic Metabolic Panel:  Recent Labs Lab 10/23/16 1138 10/23/16 2340 10/26/16 0252 10/27/16 0948 10/29/16 0226  NA 132* 132* 132* 134* 134*  K 5.4* 4.9 4.3 4.3 4.3  CL 105 106 106 108 107  CO2 19* 18* 19* 18* 21*  GLUCOSE 157* 105* 131* 131* 123*  BUN 39* 39* 30* 24* 22*  CREATININE 2.49* 2.16* 1.77* 1.81* 1.81*  CALCIUM 8.6* 8.1* 7.9* 8.0* 7.9*  MG  --   --   --  1.7 1.6*   GFR: Estimated Creatinine Clearance: 51.6 mL/min (by C-G formula based on SCr of 1.81 mg/dL (H)). Liver Function Tests:  Recent  Labs Lab 10/23/16 1138 10/23/16 2340 10/26/16 0252  AST 45* 42* 61*  ALT 26 24 26   ALKPHOS 76 62 52  BILITOT 3.2* 2.5* 1.4*  PROT 7.1 6.6 6.0*  ALBUMIN 2.1* 1.9* 1.5*   No results for input(s): LIPASE, AMYLASE in the last 168 hours.  Recent Labs Lab 10/23/16 1241  AMMONIA 18   Coagulation Profile:  Recent Labs Lab 10/23/16 1138  INR 1.96   Cardiac Enzymes: No results for input(s): CKTOTAL, CKMB, CKMBINDEX, TROPONINI in the last 168 hours. BNP (last 3 results) No results for input(s): PROBNP in the last 8760 hours. HbA1C: No results for input(s): HGBA1C in the last 72 hours. CBG:  Recent  Labs Lab 10/28/16 1216 10/28/16 1544 10/28/16 1935 10/28/16 2148 10/29/16 0802  GLUCAP 125* 124* 135* 106* 102*   Lipid Profile: No results for input(s): CHOL, HDL, LDLCALC, TRIG, CHOLHDL, LDLDIRECT in the last 72 hours. Thyroid Function Tests: No results for input(s): TSH, T4TOTAL, FREET4, T3FREE, THYROIDAB in the last 72 hours. Anemia Panel: No results for input(s): VITAMINB12, FOLATE, FERRITIN, TIBC, IRON, RETICCTPCT in the last 72 hours. Urine analysis:    Component Value Date/Time   COLORURINE AMBER (A) 10/24/2016 0456   APPEARANCEUR HAZY (A) 10/24/2016 0456   LABSPEC 1.017 10/24/2016 0456   PHURINE 5.0 10/24/2016 0456   GLUCOSEU NEGATIVE 10/24/2016 0456   HGBUR NEGATIVE 10/24/2016 0456   BILIRUBINUR NEGATIVE 10/24/2016 0456   KETONESUR NEGATIVE 10/24/2016 0456   PROTEINUR NEGATIVE 10/24/2016 0456   NITRITE NEGATIVE 10/24/2016 0456   LEUKOCYTESUR NEGATIVE 10/24/2016 0456   Sepsis Labs: @LABRCNTIP (procalcitonin:4,lacticidven:4)  ) Recent Results (from the past 240 hour(s))  Culture, blood (Routine x 2)     Status: None   Collection Time: 10/23/16 11:40 AM  Result Value Ref Range Status   Specimen Description BLOOD RIGHT ANTECUBITAL  Final   Special Requests   Final    BOTTLES DRAWN AEROBIC AND ANAEROBIC 10CC AER 5CC ANA   Culture NO GROWTH 5 DAYS  Final    Report Status 10/28/2016 FINAL  Final  Culture, blood (Routine x 2)     Status: None   Collection Time: 10/23/16 11:54 AM  Result Value Ref Range Status   Specimen Description BLOOD RIGHT HAND  Final   Special Requests BOTTLES DRAWN AEROBIC ONLY 10CC  Final   Culture NO GROWTH 5 DAYS  Final   Report Status 10/28/2016 FINAL  Final  C difficile quick scan w PCR reflex     Status: None   Collection Time: 10/23/16  5:47 PM  Result Value Ref Range Status   C Diff antigen NEGATIVE NEGATIVE Final   C Diff toxin NEGATIVE NEGATIVE Final   C Diff interpretation No C. difficile detected.  Final  Urine culture     Status: Abnormal   Collection Time: 10/24/16  4:56 AM  Result Value Ref Range Status   Specimen Description URINE, RANDOM  Final   Special Requests NONE  Final   Culture <10,000 COLONIES/mL INSIGNIFICANT GROWTH (A)  Final   Report Status 10/25/2016 FINAL  Final  Culture, body fluid-bottle     Status: None (Preliminary result)   Collection Time: 10/24/16 11:10 AM  Result Value Ref Range Status   Specimen Description PERITONEAL  Final   Special Requests NONE  Final   Culture NO GROWTH 4 DAYS  Final   Report Status PENDING  Incomplete  Gram stain     Status: None   Collection Time: 10/24/16 11:10 AM  Result Value Ref Range Status   Specimen Description PERITONEAL  Final   Special Requests NONE  Final   Gram Stain   Final    RARE WBC PRESENT,BOTH PMN AND MONONUCLEAR NO ORGANISMS SEEN    Report Status 10/24/2016 FINAL  Final  MRSA PCR Screening     Status: None   Collection Time: 10/24/16  5:12 PM  Result Value Ref Range Status   MRSA by PCR NEGATIVE NEGATIVE Final    Comment:        The GeneXpert MRSA Assay (FDA approved for NASAL specimens only), is one component of a comprehensive MRSA colonization surveillance program. It is not intended to diagnose MRSA infection nor to guide or  monitor treatment for MRSA infections.   Anaerobic culture     Status: None (Preliminary  result)   Collection Time: 10/25/16  5:36 PM  Result Value Ref Range Status   Specimen Description WOUND LEFT TOE  Final   Special Requests ANAEROBIC SWAB ONLY  Final   Gram Stain   Final    ABUNDANT WBC PRESENT, PREDOMINANTLY PMN RARE GRAM POSITIVE COCCI IN PAIRS    Culture   Final    NO ANAEROBES ISOLATED; CULTURE IN PROGRESS FOR 5 DAYS   Report Status PENDING  Incomplete         Radiology Studies: No results found.      Scheduled Meds: . aspirin EC  81 mg Oral Daily  . atorvastatin  40 mg Oral Daily  . brimonidine  1 drop Both Eyes Daily  . doxycycline (VIBRAMYCIN) IV  100 mg Intravenous Q12H  . fluconazole (DIFLUCAN) IV  200 mg Intravenous Once  . insulin aspart  0-9 Units Subcutaneous TID WC & HS  . nadolol  40 mg Oral Daily  . nystatin   Topical BID  . piperacillin-tazobactam (ZOSYN)  IV  3.375 g Intravenous Q8H   Continuous Infusions: . sodium chloride 75 mL/hr at 10/29/16 0816     LOS: 6 days    Time spent: 40 minutes    Marvyn Torrez, Roselind Messier, [name] Pager [phone]   If 7PM-7AM, please contact night-coverage [email or web address] Password [facility] 10/29/2016, 8:18 AM

## 2016-10-30 ENCOUNTER — Inpatient Hospital Stay (HOSPITAL_COMMUNITY): Payer: BC Managed Care – PPO

## 2016-10-30 DIAGNOSIS — K3189 Other diseases of stomach and duodenum: Secondary | ICD-10-CM

## 2016-10-30 DIAGNOSIS — I8510 Secondary esophageal varices without bleeding: Secondary | ICD-10-CM

## 2016-10-30 DIAGNOSIS — E118 Type 2 diabetes mellitus with unspecified complications: Secondary | ICD-10-CM

## 2016-10-30 DIAGNOSIS — R7989 Other specified abnormal findings of blood chemistry: Secondary | ICD-10-CM

## 2016-10-30 DIAGNOSIS — K7460 Unspecified cirrhosis of liver: Secondary | ICD-10-CM

## 2016-10-30 DIAGNOSIS — G43A0 Cyclical vomiting, not intractable: Secondary | ICD-10-CM

## 2016-10-30 DIAGNOSIS — R197 Diarrhea, unspecified: Secondary | ICD-10-CM

## 2016-10-30 DIAGNOSIS — E440 Moderate protein-calorie malnutrition: Secondary | ICD-10-CM

## 2016-10-30 LAB — OCCULT BLOOD X 1 CARD TO LAB, STOOL
FECAL OCCULT BLD: POSITIVE — AB
Fecal Occult Bld: POSITIVE — AB

## 2016-10-30 LAB — ANAEROBIC CULTURE

## 2016-10-30 LAB — DIFFERENTIAL
BASOS PCT: 0 %
Basophils Absolute: 0 10*3/uL (ref 0.0–0.1)
EOS PCT: 6 %
Eosinophils Absolute: 0.5 10*3/uL (ref 0.0–0.7)
Lymphocytes Relative: 11 %
Lymphs Abs: 0.9 10*3/uL (ref 0.7–4.0)
MONOS PCT: 5 %
Monocytes Absolute: 0.4 10*3/uL (ref 0.1–1.0)
Neutro Abs: 6.2 10*3/uL (ref 1.7–7.7)
Neutrophils Relative %: 78 %

## 2016-10-30 LAB — BASIC METABOLIC PANEL
Anion gap: 7 (ref 5–15)
BUN: 26 mg/dL — AB (ref 6–20)
CHLORIDE: 107 mmol/L (ref 101–111)
CO2: 19 mmol/L — AB (ref 22–32)
Calcium: 8.1 mg/dL — ABNORMAL LOW (ref 8.9–10.3)
Creatinine, Ser: 1.91 mg/dL — ABNORMAL HIGH (ref 0.61–1.24)
GFR calc Af Amer: 40 mL/min — ABNORMAL LOW (ref >60)
GFR calc non Af Amer: 35 mL/min — ABNORMAL LOW (ref >60)
Glucose, Bld: 139 mg/dL — ABNORMAL HIGH (ref 65–99)
Potassium: 4.6 mmol/L (ref 3.5–5.1)
SODIUM: 133 mmol/L — AB (ref 135–145)

## 2016-10-30 LAB — CBC
HEMATOCRIT: 22.6 % — AB (ref 39.0–52.0)
HEMOGLOBIN: 7.8 g/dL — AB (ref 13.0–17.0)
MCH: 33.8 pg (ref 26.0–34.0)
MCHC: 34.5 g/dL (ref 30.0–36.0)
MCV: 97.8 fL (ref 78.0–100.0)
Platelets: 87 10*3/uL — ABNORMAL LOW (ref 150–400)
RBC: 2.31 MIL/uL — ABNORMAL LOW (ref 4.22–5.81)
RDW: 14.7 % (ref 11.5–15.5)
WBC: 7.6 10*3/uL (ref 4.0–10.5)

## 2016-10-30 LAB — MAGNESIUM: MAGNESIUM: 2 mg/dL (ref 1.7–2.4)

## 2016-10-30 LAB — TECHNOLOGIST SMEAR REVIEW

## 2016-10-30 LAB — GLUCOSE, CAPILLARY
GLUCOSE-CAPILLARY: 137 mg/dL — AB (ref 65–99)
GLUCOSE-CAPILLARY: 114 mg/dL — AB (ref 65–99)
Glucose-Capillary: 130 mg/dL — ABNORMAL HIGH (ref 65–99)
Glucose-Capillary: 125 mg/dL — ABNORMAL HIGH (ref 65–99)

## 2016-10-30 LAB — FIBRINOGEN: FIBRINOGEN: 121 mg/dL — AB (ref 210–475)

## 2016-10-30 LAB — PROTIME-INR
INR: 2.5
Prothrombin Time: 27.4 seconds — ABNORMAL HIGH (ref 11.4–15.2)

## 2016-10-30 MED ORDER — SPIRONOLACTONE 25 MG PO TABS
50.0000 mg | ORAL_TABLET | Freq: Every day | ORAL | Status: DC
  Filled 2016-10-30: qty 2

## 2016-10-30 MED ORDER — FLUCONAZOLE 100 MG PO TABS
100.0000 mg | ORAL_TABLET | Freq: Every day | ORAL | Status: DC
  Filled 2016-10-30: qty 1

## 2016-10-30 MED ORDER — PROMETHAZINE HCL 25 MG/ML IJ SOLN
6.2500 mg | Freq: Four times a day (QID) | INTRAMUSCULAR | Status: DC | PRN
Start: 2016-10-30 — End: 2016-11-08

## 2016-10-30 MED ORDER — METOCLOPRAMIDE HCL 5 MG/ML IJ SOLN
5.0000 mg | Freq: Three times a day (TID) | INTRAMUSCULAR | Status: DC
  Administered 2016-10-30 – 2016-10-31 (×2): 5 mg via INTRAVENOUS
  Filled 2016-10-30 (×4): qty 2

## 2016-10-30 MED ORDER — DOXYCYCLINE HYCLATE 100 MG PO TABS: 100.0000 mg | ORAL_TABLET | Freq: Two times a day (BID) | ORAL | Status: DC

## 2016-10-30 MED ORDER — SACCHAROMYCES BOULARDII 250 MG PO CAPS
250.0000 mg | ORAL_CAPSULE | Freq: Two times a day (BID) | ORAL | Status: DC
  Administered 2016-10-31 – 2016-11-08 (×15): 250 mg via ORAL
  Filled 2016-10-30 (×16): qty 1

## 2016-10-30 MED ORDER — BOOST / RESOURCE BREEZE PO LIQD
1.0000 | Freq: Two times a day (BID) | ORAL | Status: DC
  Administered 2016-11-01 – 2016-11-02 (×2): 1 via ORAL

## 2016-10-30 MED ORDER — INSULIN ASPART 100 UNIT/ML ~~LOC~~ SOLN
0.0000 [IU] | Freq: Three times a day (TID) | SUBCUTANEOUS | Status: DC
  Administered 2016-11-02 – 2016-11-03 (×5): 1 [IU] via SUBCUTANEOUS
  Administered 2016-11-03 (×3): 2 [IU] via SUBCUTANEOUS
  Administered 2016-11-04: 1 [IU] via SUBCUTANEOUS
  Administered 2016-11-04: 2 [IU] via SUBCUTANEOUS
  Administered 2016-11-04: 1 [IU] via SUBCUTANEOUS
  Administered 2016-11-04: 2 [IU] via SUBCUTANEOUS
  Administered 2016-11-05 (×2): 1 [IU] via SUBCUTANEOUS
  Administered 2016-11-05: 2 [IU] via SUBCUTANEOUS
  Administered 2016-11-06 (×2): 1 [IU] via SUBCUTANEOUS
  Administered 2016-11-07: 2 [IU] via SUBCUTANEOUS
  Administered 2016-11-07 – 2016-11-08 (×4): 1 [IU] via SUBCUTANEOUS

## 2016-10-30 MED ORDER — METOCLOPRAMIDE HCL 5 MG PO TABS
5.0000 mg | ORAL_TABLET | Freq: Two times a day (BID) | ORAL | Status: DC | PRN
  Filled 2016-10-30: qty 1

## 2016-10-30 MED ORDER — SODIUM CHLORIDE 0.9 % IV SOLN
3.0000 g | Freq: Four times a day (QID) | INTRAVENOUS | Status: DC
  Administered 2016-10-30 – 2016-11-06 (×26): 3 g via INTRAVENOUS
  Filled 2016-10-30 (×28): qty 3

## 2016-10-30 MED ORDER — NADOLOL 20 MG PO TABS
20.0000 mg | ORAL_TABLET | Freq: Every day | ORAL | Status: DC
  Administered 2016-10-30 – 2016-11-08 (×8): 20 mg via ORAL
  Filled 2016-10-30 (×10): qty 1

## 2016-10-30 MED ORDER — INSULIN ASPART 100 UNIT/ML ~~LOC~~ SOLN: 0.0000 [IU] | Freq: Every day | SUBCUTANEOUS | Status: DC

## 2016-10-30 MED ORDER — ALBUMIN HUMAN 25 % IV SOLN
50.0000 g | Freq: Once | INTRAVENOUS | Status: AC
  Administered 2016-10-30: 50 g via INTRAVENOUS
  Filled 2016-10-30 (×2): qty 200

## 2016-10-30 MED ORDER — PRO-STAT SUGAR FREE PO LIQD
30.0000 mL | Freq: Three times a day (TID) | ORAL | Status: DC
  Administered 2016-11-01 – 2016-11-08 (×8): 30 mL via ORAL
  Filled 2016-10-30 (×10): qty 30

## 2016-10-30 MED ORDER — INSULIN ASPART 100 UNIT/ML ~~LOC~~ SOLN: 0.0000 [IU] | Freq: Three times a day (TID) | SUBCUTANEOUS | Status: DC

## 2016-10-30 NOTE — Progress Notes (Signed)
Subjective: Patient seen earlier this afternoon. States that he is doing well in regards to his foot.   Objective: Vital signs in last 24 hours: Temp:  [97.8 F (36.6 C)-98.2 F (36.8 C)] 98.2 F (36.8 C) (02/12 1439) Pulse Rate:  [61-71] 61 (02/12 1439) Resp:  [14-18] 17 (02/12 1439) BP: (100-104)/(48-63) 100/50 (02/12 1439) SpO2:  [95 %-99 %] 98 % (02/12 1439) Weight:  [255 lb 9.6 oz (115.9 kg)] 255 lb 9.6 oz (115.9 kg) (02/12 0702)  Intake/Output from previous day: 02/11 0701 - 02/12 0700 In: 390 [P.O.:240; IV Piggyback:150] Out: 1 [Stool:1] Intake/Output this shift: Total I/O In: 2670 [P.O.:170; I.V.:1950; IV Piggyback:550] Out: -    Recent Labs  10/29/16 0226 10/30/16 0315  HGB 7.8* 7.8*    Recent Labs  10/29/16 0226 10/30/16 0315  WBC 5.2 7.6  RBC 2.28* 2.31*  HCT 22.3* 22.6*  PLT 71* 87*    Recent Labs  10/29/16 0226 10/30/16 0315  NA 134* 133*  K 4.3 4.6  CL 107 107  CO2 21* 19*  BUN 22* 26*  CREATININE 1.81* 1.91*  GLUCOSE 123* 139*  CALCIUM 7.9* 8.1*    Recent Labs  10/30/16 0818  INR 2.50    Exam: Patient alert and oriented. Dressing and Wound VAC intact.  Wound cultures Abundant wbc present, predominantly p.m. in. Rare gram-positive cocci in pairs. No anaerobes isolated.   Assessment/Plan: Reviewed labs with my attending Dr. Ophelia CharterYates. He is stable from orthopedic standpoint. Tomorrow I will change dressing and remove wound VAC per his instructions. He can discharge home on doxycycline 100 mg by mouth twice a day 2 weeks.   Jeffery [name] 10/30/2016, 3:06 PM

## 2016-10-30 NOTE — Consult Note (Signed)
[facility] Consult: 8:47 AM 10/30/2016  LOS: 7 days    Referring Provider: Dr Allena Katz  Primary Care Physician:  Julian Hy, MD Primary Gastroenterologist:  Dr. Christella Hartigan     Reason for Consultation:  FOBT + anemia.    HPI: Jeffery [name] is a 66 y.o. male.  Patient is a professor of mouth, Nutritional therapist. Patient has wheelchair mobility due to previous lower extremity amputation. Type 2 DM, not on oral meds or insulin currently.  S/p right AKA. Cirrhosis, diagnosed in the summer of 2017.  Ultrasound showed ascites, question cirrhosis, splenomegaly. Fibrosure scan showed  F4-c/w cirrhosis, but minimal to no necroinflammatory activity. Hepatitis B and C negative.  No history of alcohol abuse. Ammonia 119 but no clinical  Encephalopathy.  History of morbid obesity, as a younger man he weighed up to 330 pounds.  Normocytic anemia (Hgb 8.5 in 03/2016).  Thrombocytopenia (platelets 68 in 04/2016).    05/18/2016 colonoscopy, screening study. Normal study to the cecum. 05/18/2016 EGD: For variceal screening and single episode of hematemesis 3 months prior to procedure.  Dr. Christella Hartigan found grade 2 (medium sized) esophageal varices. Portal hypertensive gastropathy. Biopsies showed reactive gastropathy but no H. pylori or dysplasia. Patient started on nadolol 40 mg daily.  Patient is not taking PPI or H2 blocker at home.  Patient seen by Dr. Christella Hartigan in the office in November 2017 for 3-4 loose stools daily but no abdominal pain.  At that time he had yet to show up for lab work to rule out more obscure causes of cirrhosis. He did then undergo testing and ANA, mitochondrial antibodies and smooth muscle antibodies were all within normal limits. Stool studies were ordered but he never submitted a  specimen.  And was to start him on OTC antidiarrheals if the stool studies were negative. He has yet to start any antidiarrheals. He's been having up to 14 watery, dark but not grossly bloody stools, daily.  He feels bloated. There is spends some discomfort in his left side but this is not severe. Several weeks back he vomited some dark material but there was no blood in this. He does take twice daily iron since summer 2017.  On 10/24/16 patient was admitted with weakness, diarrhea and abdominal pain. He had stopped eating because by mouth intake led to more frequent stooling. He complained of some nausea and dry heaves as well as fullness in his stomach.  He had been taking tetracycline for nearly 4 weeks for a nonhealing left foot ulcer.   The diarrhea preceded the use of doxycycline by at least one week. At arrival to the ED he was hypotensive which improved with IV fluids.  Stool C. difficile testing negative, 2/2 FOBT +.  Fecal lactoferrin negative.  Patient is coagulopathic.  PT/INR 27/2.5.   Mild hyponatremia at 132. Hyperkalemic at 5.4. AKI with GFR of 25, now improved.  Elevated T bili at 3.2. Alkaline phosphatase normal. AST elevated, currently at 61, normal ALT. Hgb 10.5 .. 9.2.. 9.8.. 7.8.  10/23/16 CT abd/pelvis without  contrast: Cirrhosis with large volume ascites. Mild wall thickening throughout colon may reflect edema secondary to ascites versus mild colitis. Cholelithiasis. Renal cysts bilaterally S/p 10/24/16 3.3 liter paracentisis, fluid WBCs 205.   S/p 10/25/16 left hallux amputation for osteomyelitis.  Since his admission to the hospital and surgery, he has developed nausea and vomiting which he describes as dark but not bloody. Today the nurse describes his stools as greenish/ brown liquid/watery.      Past Medical History:  Diagnosis Date  . Arthritis   . Asthma   . Bilateral cataracts   . Blurred vision    improved now  . Cholelithiasis   . Diabetes mellitus    type II  .  Eczema    inner wrist  . Hypertension   . Muscle cramps   . Necrotizing fasciitis (HCC)   . Neuropathy (HCC)   . Rash    wrist and hands since late june  . Retinal degeneration     Past Surgical History:  Procedure Laterality Date  . ABOVE KNEE LEG AMPUTATION     right  . COLONOSCOPY WITH PROPOFOL N/A 05/18/2016   Procedure: COLONOSCOPY WITH PROPOFOL;  Surgeon: Rachael Fee, MD;  Location: [facility];  Service: Endoscopy;  Laterality: N/A;  . ESOPHAGOGASTRODUODENOSCOPY (EGD) WITH PROPOFOL N/A 05/18/2016   Procedure: ESOPHAGOGASTRODUODENOSCOPY (EGD) WITH PROPOFOL;  Surgeon: Rachael Fee, MD;  Location: [facility];  Service: Endoscopy;  Laterality: N/A;  . EYE SURGERY  09/2011   bilateral for retinopathy - laser eye surgery  . I&D EXTREMITY Left 10/25/2016   Procedure: LEFT FOOT DEBRIDEMENT RAY AMPUTATION PLACEMENT OF WOUND VAC;  Surgeon: Eldred Manges, MD;  Location: [facility] OR;  Service: Orthopedics;  Laterality: Left;  . left metatarsal  2003   5th  . TOE AMPUTATION     Left big toe    Prior to Admission medications   Medication Sig Start Date End Date Taking? Authorizing Provider  aspirin 81 MG tablet Take 81 mg by mouth daily.   Yes Historical Provider, MD  atorvastatin (LIPITOR) 40 MG tablet Take 40 mg by mouth daily. 01/31/16  Yes Historical Provider, MD  brimonidine (ALPHAGAN P) 0.1 % SOLN Place 1 drop into both eyes daily.    Yes Historical Provider, MD  losartan (COZAAR) 50 MG tablet Take 50 mg by mouth daily.   Yes Historical Provider, MD  Multiple Vitamin (MULTIVITAMIN WITH MINERALS) TABS tablet Take 1 tablet by mouth daily.   Yes Historical Provider, MD  nadolol (CORGARD) 40 MG tablet Take 1 tablet (40 mg total) by mouth daily. 06/02/16  Yes Rachael Fee, MD    Scheduled Meds: . albumin human  50 g Intravenous Once  . aspirin EC  81 mg Oral Daily  . atorvastatin  40 mg Oral Daily  . brimonidine  1 drop Both Eyes Daily  . doxycycline (VIBRAMYCIN) IV  100 mg  Intravenous Q12H  . fluconazole (DIFLUCAN) IV  100 mg Intravenous Q24H  . insulin aspart  0-9 Units Subcutaneous TID WC & HS  . nadolol  20 mg Oral Daily  . nystatin   Topical BID  . PARoxetine  20 mg Oral Daily  . piperacillin-tazobactam (ZOSYN)  IV  3.375 g Intravenous Q8H   Infusions:  PRN Meds: acetaminophen **OR** acetaminophen, alum & mag hydroxide-simeth, methocarbamol, morphine injection, naLOXone (NARCAN)  injection, ondansetron, traZODone   Allergies as of 10/23/2016  . (No Known Allergies)    Family History  Problem Relation Age of Onset  .  Cancer Mother     unknown type    Social History   Social History  . Marital status: Married    Spouse name: N/A  . Number of children: N/A  . Years of education: N/A   Occupational History  . Not on file.   Social History Main Topics  . Smoking status: Never Smoker  . Smokeless tobacco: Never Used  . Alcohol use No  . Drug use: No  . Sexual activity: Not on file     Comment: computer work. next of kin. Sister Marisue HumbleMAureen next of kin   Other Topics Concern  . Not on file   Social History Narrative  . No narrative on file    REVIEW OF SYSTEMS: Constitutional:  Weakness. ENT:  With the dry air in the hospital the patient reports some scant blood in his nasal discharge but no epistaxis.  Wonders why his mouth is so dry. Pulm:  No shortness of breath. No cough. CV:  No palpitations.  Some left lower extremity edema. GU:  No hematuria, no frequency GI:  Per HPI.   Heme:  No excessive bleeding or bruising.   Transfusions:  None. Neuro:  No headaches, no peripheral tingling or numbness Derm:  No itching, no rash or sores.  Endocrine:  No sweats or chills.  No polyuria or dysuria Immunization:  Current on flu shot Travel:  None beyond local counties in last few months.    PHYSICAL EXAM: Vital signs in last 24 hours: Vitals:   10/29/16 2105 10/30/16 0702  BP: (!) 104/56 (!) 104/48  Pulse: 64 71  Resp: 18 18    Temp: 97.8 F (36.6 C) 97.8 F (36.6 C)   Wt Readings from Last 3 Encounters:  10/30/16 115.9 kg (255 lb 9.6 oz)  05/18/16 108 kg (238 lb)  03/24/16 108.1 kg (238 lb 6.4 oz)    General: Obese, pleasant, pale, chronically ill looking WN. Head:  No facial asymmetry or swelling.  Eyes:  No conjunctival pallor or scleral icterus. EOMI. Ears:  Not HOH.  Nose:  No discharge or congestion. Mouth:  Oral mucosa is clear. Tongue is midline. Fair Engineer, manufacturing systemsdental repair. Neck:  No JVD, no masses, no thyromegaly. Lungs:  Clear bilaterally. No labored breathing or cough. Heart: RRR. No MRG. S1, S2 present. Abdomen:  Obese. Not tender. Bowel sounds active. Soft. Fullness without discrete splenomegaly in the left abdomen. No hernias, no bruits..   Rectal: Deferred rectal exam. Stool at about 10:30 AM described as brownish green by nurse   Musc/Skeltl: Right AKA, scar well healed. Wound VAC covering the left foot, status post left great toe amputation. Extremities:  No visible edema on the left.  Neurologic:  Alert. Good historian. Oriented times 3. No tremor. No asterixis. Skin:  No telangiectasia, sores or rashes other than the left great toe Tattoos:  None seen. Nodes:  No cervical adenopathy.   Psych:  Cooperative, pleasant, in good humor.  Intake/Output from previous day: 02/11 0701 - 02/12 0700 In: 240 [P.O.:240] Out: 1 [Stool:1] Intake/Output this shift: No intake/output data recorded.  LAB RESULTS:  Recent Labs  10/27/16 0948 10/29/16 0226 10/30/16 0315  WBC 6.4 5.2 7.6  HGB 9.8* 7.8* 7.8*  HCT 29.0* 22.3* 22.6*  PLT 92* 71* 87*   BMET Lab Results  Component Value Date   NA 133 (L) 10/30/2016   NA 134 (L) 10/29/2016   NA 134 (L) 10/27/2016   K 4.6 10/30/2016   K 4.3 10/29/2016  K 4.3 10/27/2016   CL 107 10/30/2016   CL 107 10/29/2016   CL 108 10/27/2016   CO2 19 (L) 10/30/2016   CO2 21 (L) 10/29/2016   CO2 18 (L) 10/27/2016   GLUCOSE 139 (H) 10/30/2016   GLUCOSE 123  (H) 10/29/2016   GLUCOSE 131 (H) 10/27/2016   BUN 26 (H) 10/30/2016   BUN 22 (H) 10/29/2016   BUN 24 (H) 10/27/2016   CREATININE 1.91 (H) 10/30/2016   CREATININE 1.81 (H) 10/29/2016   CREATININE 1.81 (H) 10/27/2016   CALCIUM 8.1 (L) 10/30/2016   CALCIUM 7.9 (L) 10/29/2016   CALCIUM 8.0 (L) 10/27/2016   LFT No results for input(s): PROT, ALBUMIN, AST, ALT, ALKPHOS, BILITOT, BILIDIR, IBILI in the last 72 hours. PT/INR Lab Results  Component Value Date   INR 1.96 10/23/2016   INR 1.7 (H) 08/07/2016   INR 1.4 (H) 04/21/2016   PROTIME 15.6 (H) 03/20/2016   Hepatitis Panel No results for input(s): HEPBSAG, HCVAB, HEPAIGM, HEPBIGM in the last 72 hours. C-Diff No components found for: CDIFF Lipase  No results found for: LIPASE  Drugs of Abuse  No results found for: LABOPIA, COCAINSCRNUR, LABBENZ, AMPHETMU, THCU, LABBARB   RADIOLOGY STUDIES: No results found.    IMPRESSION:   *  FOBT + stool.  Diarrhea.  C diff negative. Both stools and emesis are "dark" but he takes BID Iron.   Colonoscopy in 04/2016: normal.  Not having diarrhea then.   CT scan last week showed pan colonic wall thickening, probably reflective of his hypoalbuminemia.  *  Normocytic anemia.  Had same in 04/2016.  On oral iron BID at home.   *  Cirrhosis, decompensated with ascites.  Coagulopathy.  Grade 2 esophageal varices and portal gastropathy on EGD 04/2016, on Nadolol since then.   S/p 3.3 liter tap.   Not on diuretics.   *  Thrombocytopenia chronic, splenomegaly.    *  Osteomyelitis.  S/p left Hallux amputation 1 week ago.  S/p right AKA.  On Diflucan, IV doxycycline, Zosyn  *  Hypoalbuminemia, probable protein calorie malnutrition.   PLAN:     *  Since FOBT positivity is well established, we don't need to keep submitting stool specimens.  *  Normally would begin Aldactone/Lasix for the ascites but his blood pressure is low so I'm not sure he would tolerate these.  *  Question initiate  Protonix as a prophylaxis given his medical issues, though this is not a treatment for portal gastropathy.   *  Prealbumin and AFP in AM.    *  Start immodium BID?  Willl d/w Dr Lavon Paganini.      [name]  10/30/2016, 8:47 AM Pager: [phone]

## 2016-10-30 NOTE — [place] FL2 (Addendum)
[place] MEDICAID FL2 LEVEL OF CARE SCREENING TOOL     IDENTIFICATION  Patient Name: Jeffery BoydenKenneth Nesser Birthdate: 1950-12-01 Sex: male Admission Date (Current Location): 10/23/2016  [facility] [place]   Facility and Address:  [facility], [address], [place], [place] [address]      Provider Number: [id number]  Attending Physician Name and Address:  Rolly SalterPranav M Patel, MD  Relative Name and Phone Number:       Current Level of Care: Hospital Recommended Level of Care: Skilled Nursing Facility Prior Approval Number:    Date Approved/Denied:   PASRR Number: [id number]   Discharge Plan: SNF    Current Diagnoses: Patient Active Problem List   Diagnosis Date Noted  . Moderate single current episode of major depressive disorder (HCC)   . Anxiety state   . Diaper candidiasis   . Somnolence   . Moderate malnutrition (HCC)   . Noninfectious gastroenteritis   . Gastrointestinal hemorrhage   . Controlled diabetes mellitus type 2 with complications (HCC)   . Osteomyelitis of left foot (HCC)   . Acute renal failure (HCC)   . Abdominal pain   . Diarrhea   . Osteomyelitis (HCC)   . C. difficile colitis 10/23/2016  . AKI (acute kidney injury) (HCC)   . Weakness   . Gastrointestinal hemorrhage with melena   . Idiopathic hypotension   . Other hyperlipidemia   . Esophageal varices without bleeding (HCC)   . Portal hypertensive gastropathy   . Cirrhosis of liver with ascites (HCC) 05/12/2016  . Special screening for malignant neoplasms, colon 05/12/2016  . Coagulopathy (HCC) 04/28/2016  . Hemolytic anemia (HCC) 03/24/2016  . G6PD deficiency anemia (HCC) 03/24/2016  . Thrombocytopenia (HCC) 03/24/2016  . Generalized psoriasis 03/20/2016  . Gilbert syndrome 03/20/2016  . Pancytopenia, acquired (HCC) 03/20/2016  . Hematemesis without nausea 03/20/2016    Orientation RESPIRATION BLADDER Height & Weight     Self, Time, Situation,  Place  Normal Continent Weight: 255 lb 9.6 oz (115.9 kg) Height:     BEHAVIORAL SYMPTOMS/MOOD NEUROLOGICAL BOWEL NUTRITION STATUS      Continent  (Please see discharge summary)  AMBULATORY STATUS COMMUNICATION OF NEEDS Skin   Extensive Assist Verbally Wound Vac, Skin abrasions (Closed inicision left toe, negative pressure wound therapy, 75mHg, continouse pressure. Closed incision left foot, compression wrap. Non pressure wound on buttocks, foam dressing. Non pressure wound on right hip, foam dressing. Scratch on back; left.)                       Personal Care Assistance Level of Assistance  Bathing, Feeding, Dressing Bathing Assistance: Maximum assistance Feeding assistance: Limited assistance Dressing Assistance: Maximum assistance     Functional Limitations Info  Sight, Hearing, Speech Sight Info: Adequate Hearing Info: Adequate Speech Info: Adequate    SPECIAL CARE FACTORS FREQUENCY  PT (By licensed PT), OT (By licensed OT)     PT Frequency: 3x week OT Frequency: 3x week            Contractures Contractures Info: Not present    Additional Factors Info  Code Status, Allergies Code Status Info: Full Code Allergies Info: No known allergies           Current Medications (10/30/2016):  This is the current hospital active medication list Current Facility-Administered Medications  Medication Dose Route Frequency Provider Last Rate Last Dose  . acetaminophen (TYLENOL) tablet 500 mg  500 mg Oral Q6H  PRN Lonia Blood, MD   500 mg at 10/30/16 1149   Or  . acetaminophen (TYLENOL) suppository 325 mg  325 mg Rectal Q6H PRN Lonia Blood, MD      . alum & mag hydroxide-simeth (MAALOX/MYLANTA) 200-200-20 MG/5ML suspension 15 mL  15 mL Oral Q6H PRN Alexis Hugelmeyer, DO   15 mL at 10/28/16 2154  . Ampicillin-Sulbactam (UNASYN) 3 g in sodium chloride 0.9 % 100 mL IVPB  3 g Intravenous Q6H Rolly Salter, MD      . atorvastatin (LIPITOR) tablet 40 mg  40 mg Oral  Daily Isaiah Blakes, PA-C   40 mg at 10/30/16 0858  . brimonidine (ALPHAGAN) 0.15 % ophthalmic solution 1 drop  1 drop Both Eyes Daily Isaiah Blakes, PA-C   1 drop at 10/29/16 0925  . doxycycline (VIBRA-TABS) tablet 100 mg  100 mg Oral Q12H Rolly Salter, MD      . feeding supplement (BOOST / RESOURCE BREEZE) liquid 1 Container  1 Container Oral BID BM Rolly Salter, MD      . feeding supplement (PRO-STAT SUGAR FREE 64) liquid 30 mL  30 mL Oral TID Rolly Salter, MD      . Melene Muller ON 10/31/2016] fluconazole (DIFLUCAN) tablet 100 mg  100 mg Oral Daily Rolly Salter, MD      . insulin aspart (novoLOG) injection 0-9 Units  0-9 Units Subcutaneous TID WC & HS Alexis Hugelmeyer, DO   1 Units at 10/30/16 1150  . methocarbamol (ROBAXIN) tablet 250 mg  250 mg Oral Q8H PRN Drema Dallas, MD   250 mg at 10/30/16 1354  . morphine 2 MG/ML injection 0.5 mg  0.5 mg Intravenous Q4H PRN Drema Dallas, MD   0.5 mg at 10/30/16 1354  . nadolol (CORGARD) tablet 20 mg  20 mg Oral Daily Rolly Salter, MD   20 mg at 10/30/16 1149  . naloxone [facility]) injection 0.4 mg  0.4 mg Intravenous PRN Drema Dallas, MD      . nystatin (MYCOSTATIN/NYSTOP) topical powder   Topical BID Alexis Hugelmeyer, DO      . ondansetron [facility]) injection 4 mg  4 mg Intravenous Q6H PRN Drema Dallas, MD   4 mg at 10/30/16 1032  . PARoxetine (PAXIL) tablet 20 mg  20 mg Oral Daily Drema Dallas, MD   20 mg at 10/30/16 0859  . saccharomyces boulardii (FLORASTOR) capsule 250 mg  250 mg Oral BID Rolly Salter, MD      . traZODone (DESYREL) tablet 25 mg  25 mg Oral QHS PRN Isaiah Blakes, PA-C         Discharge Medications: Please see discharge summary for a list of discharge medications.  Relevant Imaging Results:  Relevant Lab Results:   Additional Information SSN: [id number]  Volney American, LCSW

## 2016-10-30 NOTE — Clinical Social Work Note (Signed)
Clinical Social Work Assessment  Patient Details  Name: Jeffery [name] MRN: [id number] Date of Birth: 1951/01/17  Date of referral:  10/30/16               Reason for consult:  Facility Placement                Permission sought to share information with:    Permission granted to share information::     Name::        Agency::     Relationship::     Contact Information:     Housing/Transportation Living arrangements for the past 2 months:  Single Family Home Source of Information:  Patient Patient Interpreter Needed:  None Criminal Activity/Legal Involvement Pertinent to Current Situation/Hospitalization:  No - Comment as needed Significant Relationships:  Siblings Lives with:  Self Do you feel safe going back to the place where you live?    Need for family participation in patient care:     Care giving concerns:  No family or friends present at bedside during initial assessment.   Social Worker assessment / plan:  CSW spoke with pt at bedside to complete initial assessment. Pt lives home alone. Pt does have a sister who lives in [place]. Pt teaches at [employer]. Pt ask "why can't I go home?" However, is agreeable to SNF placement at this time. Pt prefers something close to [employer]. CSW will follow up with pt regarding bed offers once available.   Employment status:  Full-Time (Pt is a professor at [employer]) Health and safety inspectornsurance information:  Teacher, English as a foreign languageManaged Medicare PT Recommendations:  Skilled Nursing Facility Information / Referral to community resources:  Skilled Nursing Facility  Patient/Family's Response to care:  Pt verbalized understanding of CSW role and expressed appreciation for support. Pt denies any concern regarding pt care at this time.   Patient/Family's Understanding of and Emotional Response to Diagnosis, Current Treatment, and Prognosis:  Pt understanding and realistic regarding physical limitations. Pt understands the need for SNF placement at d/c. Pt agreeable to SNF placement at d/c, at  this time. Pt's responses emotionally appropriate during conversation with CSW. Pt denies any concern regarding treatment plan at this time. CSW will continue to provide support and facilitate d/c needs.   Emotional Assessment Appearance:  Appears stated age Attitude/Demeanor/Rapport:   (Patient was appropriate) Affect (typically observed):  Accepting, Appropriate Orientation:  Oriented to Self, Oriented to Place, Oriented to  Time, Oriented to Situation Alcohol / Substance use:  Not Applicable Psych involvement (Current and /or in the community):  No (Comment)  Discharge Needs  Concerns to be addressed:  No discharge needs identified Readmission within the last 30 days:  No Current discharge risk:  Dependent with Mobility Barriers to Discharge:  Continued Medical Work up   [employer] IncBridget A Mayton, LCSW 10/30/2016, 3:11 PM

## 2016-10-30 NOTE — Progress Notes (Signed)
[facility] Progress Note  Patient: Jeffery [name] ZOX:[id number]   PCP: Julian Hy, MD DOB: 03/11/1951   DOA: 10/23/2016   DOS: 10/30/2016   Date of Service: the patient was seen and examined on 10/30/2016   Subjective: Continues to have vomiting, coffee color, also has diarrhea. Mild abdominal pain in the right upper abdomen. No chest pain or shortness of breath no cough no fever.  Brief hospital course: Pt. with PMH of cirrhosis with ascites, portal hypertension with gastropathy and esophageal varices, hemolytic anemia, chronic thrombocytopenia, right AKA; admitted on 10/23/2016, with complaint of diarrhea, was found to have suspected viral gastroenteritis, GI bleed, left toe osteomyelitis and acute kidney injury. Initially admitted in the step down unit and now transferred to MedSurg floor since 10/30/2016. Currently further plan is continue GI workup, monitor response to change antibiotics.  Assessment and Plan: 1. Persistent diarrhea C. difficile negative. FOBT positive. CT scan shows pancolitis versus possible hypoalbuminemia causing wall thickening. Requirement of 14 day treatment course for antibiotics is not helping his diarrhea as well. GI has been consulted. I would add probiotic, monitor response.  2. 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 Pathology report shows clean margins. Discuss with infectious disease, recommended to continue 14 days of antibiotics. Patient was on doxycycline and IV Zosyn. Cultures so far negative. I will change to oral doxycycline and IV Unasyn while in the hospital. Probiotics added. Last day of antibiotic 11/07/2016  3. Cirrhosis of the liver. Portal hypertensive gastropathy, esophageal viruses. Possible hematemesis as well as melena. Acute blood loss anemia  GI consulted, appreciate input. Transfuse for hemoglobin less than 7. GI considering PPI. Nadolol dose has been reduced from 40 mg with 20 mg due to hypotension  and acute kidney injury. Hypoalbuminemia, low fibrinogen level, elevated INR and chronic cytopenia all secondary to cirrhosis. S/P paracentesis 10/24/2016 with 3 L of fluid removed. No evidence of SBP. Did not receive albumin after that. Will give 1 dose of albumin today. GI considering Aldactone and Lasix, unsure in the presence of acute kidney injury whether the use is advisable or not. Hold IV fluids. On clear liquid diet  4. chronic thrombocytopenia Platelet has been low since last one year. Likely in the setting of liver disease. Elevated INR and low fibrinogen due to liver disease as well. I highly doubt presence of DIC TTP at present. 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 Peripheral smear shows leukocytes without any schistocytes. Monitor for now. Stop aspirin and use mechanical prophylaxis for DVT.  5. Acute kidney injury. Baseline renal function 1.3, on presentation 2.49, today 1.9. Improving with IV hydration. Due to hypotension and dehydration from diarrhea. Unclear whether surgery as well as paracentesis has any role to play in persistence of renal dysfunction. Reduce the dose of nadolol. Getting 1 IV albumin.  6. Type 2 diabetes mellitus. The patient remains on clear liquid diet and will continue every 4 hours CBG with sliding scale. Monitor. A1c 5.8 on admission.  Bowel regimen: last BM 10/30/2016 Diet: clear liquid diet DVT Prophylaxis: mechanical compression device  Advance goals of care discussion: full code, earlier provider consulted palliative care for goals of care discussion. We'll monitor response  Family Communication: no family was present at bedside, at the time of interview.  Disposition:  Discharge to SNF. Expected discharge date: 11/01/2016, tubular sensation of GI symptoms and H&H  Consultants: Gastroenterology, orthopedics, palliative care Procedures: . First ray amputation of the left leg.  Antibiotics: Anti-infectives  Start     Dose/Rate Route Frequency Ordered Stop   10/31/16 1000  fluconazole (DIFLUCAN) tablet 100 mg     100 mg Oral Daily 10/30/16 1239 11/05/16 0959   10/30/16 2200  doxycycline (VIBRA-TABS) tablet 100 mg     100 mg Oral Every 12 hours 10/30/16 1238 11/06/16 2159   10/30/16 1400  Ampicillin-Sulbactam (UNASYN) 3 g in sodium chloride 0.9 % 100 mL IVPB     3 g 200 mL/hr over 30 Minutes Intravenous Every 6 hours 10/30/16 1333 11/06/16 1359   10/30/16 0800  fluconazole (DIFLUCAN) IVPB 100 mg  Status:  Discontinued     100 mg 50 mL/hr over 60 Minutes Intravenous Every 24 hours 10/29/16 0826 10/30/16 1238   10/29/16 0800  fluconazole (DIFLUCAN) IVPB 200 mg     200 mg 100 mL/hr over 60 Minutes Intravenous  Once 10/29/16 0739 10/29/16 0914   10/24/16 0600  piperacillin-tazobactam (ZOSYN) IVPB 3.375 g  Status:  Discontinued    Comments:  Zosyn 3.375 g IV q8h for CrCl > 10 mL/min   3.375 g 12.5 mL/hr over 240 Minutes Intravenous Every 8 hours 10/24/16 0130 10/30/16 1231   10/23/16 2330  doxycycline (VIBRAMYCIN) 100 mg in dextrose 5 % 250 mL IVPB  Status:  Discontinued     100 mg 125 mL/hr over 120 Minutes Intravenous Every 12 hours 10/23/16 2322 10/30/16 1231   10/23/16 2330  piperacillin-tazobactam (ZOSYN) IVPB 3.375 g  Status:  Discontinued     3.375 g 12.5 mL/hr over 240 Minutes Intravenous Every 8 hours 10/23/16 2323 10/24/16 0130        Objective: Physical Exam: Vitals:   10/29/16 2105 10/30/16 0702 10/30/16 1153 10/30/16 1439  BP: (!) 104/56 (!) 104/48 (!) 102/59 (!) 100/50  Pulse: 64 71 61 61  Resp: 18 18  17   Temp: 97.8 F (36.6 C) 97.8 F (36.6 C)  98.2 F (36.8 C)  TempSrc: Oral Oral  Oral  SpO2: 95% 98% 99% 98%  Weight:  115.9 kg (255 lb 9.6 oz)      Intake/Output Summary (Last 24 hours) at 10/30/16 1456 Last data filed at 10/30/16 1439  Gross per 24 hour  Intake             2820 ml  Output                1 ml  Net             2819 ml   Filed Weights    10/28/16 0500 10/29/16 0500 10/30/16 0702  Weight: 104.9 kg (231 lb 3.2 oz) 107.2 kg (236 lb 4.8 oz) 115.9 kg (255 lb 9.6 oz)    General: Alert, Awake and Oriented to Time, Place and Person. Appear in mild distress, affect appropriate Eyes: PERRL, Conjunctiva normal ENT: Oral Mucosa clear moist. Neck: difficult to assess JVD, no Abnormal Mass Or lumps Cardiovascular: S1 and S2 Present, no Murmur, Respiratory: Bilateral Air entry equal and Decreased, no use of accessory muscle, Clear to Auscultation, no Crackles, no wheezes Abdomen: Bowel Sound present, Soft and mild tenderness Skin: no redness, no Rash, no induration Extremities: bilateral Pedal edema, no calf tenderness Neurologic: Grossly no focal neuro deficit. Bilaterally Equal motor strength  Data Reviewed: CBC:  Recent Labs Lab 10/23/16 2340 10/24/16 1243 10/26/16 0252 10/27/16 0948 10/29/16 0226 10/30/16 0315 10/30/16 0708  WBC 7.7  --  4.5 6.4 5.2 7.6  --   NEUTROABS  --   --   --   --   --   --  6.2  HGB 10.1* 11.2* 9.2* 9.8* 7.8* 7.8*  --   HCT 29.1* 32.8* 26.9* 29.0* 22.3* 22.6*  --   MCV 99.0  --  99.6 100.0 97.8 97.8  --   PLT 92*  --  75* 92* 71* 87*  --    Basic Metabolic Panel:  Recent Labs Lab 10/23/16 2340 10/26/16 0252 10/27/16 0948 10/29/16 0226 10/30/16 0315  NA 132* 132* 134* 134* 133*  K 4.9 4.3 4.3 4.3 4.6  CL 106 106 108 107 107  CO2 18* 19* 18* 21* 19*  GLUCOSE 105* 131* 131* 123* 139*  BUN 39* 30* 24* 22* 26*  CREATININE 2.16* 1.77* 1.81* 1.81* 1.91*  CALCIUM 8.1* 7.9* 8.0* 7.9* 8.1*  MG  --   --  1.7 1.6* 2.0    Liver Function Tests:  Recent Labs Lab 10/23/16 2340 10/26/16 0252  AST 42* 61*  ALT 24 26  ALKPHOS 62 52  BILITOT 2.5* 1.4*  PROT 6.6 6.0*  ALBUMIN 1.9* 1.5*   No results for input(s): LIPASE, AMYLASE in the last 168 hours.  Recent Labs Lab 10/29/16 0841  AMMONIA 13   Coagulation Profile:  Recent Labs Lab 10/30/16 0818  INR 2.50   Cardiac  Enzymes: No results for input(s): CKTOTAL, CKMB, CKMBINDEX, TROPONINI in the last 168 hours. BNP (last 3 results) No results for input(s): PROBNP in the last 8760 hours.  CBG:  Recent Labs Lab 10/29/16 1152 10/29/16 1744 10/29/16 2117 10/30/16 0708 10/30/16 1134  GLUCAP 127* 144* 106* 130* 137*    Studies: No results found.   Scheduled Meds: . ampicillin-sulbactam (UNASYN) IV  3 g Intravenous Q6H  . atorvastatin  40 mg Oral Daily  . brimonidine  1 drop Both Eyes Daily  . doxycycline  100 mg Oral Q12H  . feeding supplement  1 Container Oral BID BM  . feeding supplement (PRO-STAT SUGAR FREE 64)  30 mL Oral TID  . [START ON 10/31/2016] fluconazole  100 mg Oral Daily  . insulin aspart  0-9 Units Subcutaneous TID WC & HS  . nadolol  20 mg Oral Daily  . nystatin   Topical BID  . PARoxetine  20 mg Oral Daily  . saccharomyces boulardii  250 mg Oral BID   Continuous Infusions: PRN Meds: acetaminophen **OR** acetaminophen, alum & mag hydroxide-simeth, methocarbamol, morphine injection, naLOXone (NARCAN)  injection, ondansetron, traZODone  Time spent: 45minutes  Author: Lynden OxfordPranav Corrinne Benegas, MD [facility] Pager: [phone] 10/30/2016 2:56 PM  If 7PM-7AM, please contact night-coverage at [email or web address], password [email or web address]

## 2016-10-30 NOTE — Progress Notes (Signed)
Physical Therapy Treatment Patient Details Name: Jeffery [name] MRN: [id number] DOB: Jan 21, 1951 Today's Date: 10/30/2016    History of Present Illness Pt adm with diarrhea and weakness. Pt also with nonhealing ulcer of lt foot. Pt underwent left first ray amputation. PMH - DM, rt AKA,    PT Comments    Patient limited by pain R hip this session. Attempted EOB X3. Current plan remains appropriate.   Follow Up Recommendations  SNF     Equipment Recommendations  None recommended by PT    Recommendations for Other Services       Precautions / Restrictions Precautions Precautions: Fall Restrictions Weight Bearing Restrictions: Yes RLE Weight Bearing: Weight bearing as tolerated Other Position/Activity Restrictions: with Darco shoe on    Mobility  Bed Mobility Overal bed mobility: Needs Assistance Bed Mobility: Rolling;Sidelying to Sit Rolling: Min assist Sidelying to sit: Mod assist;Max assist       General bed mobility comments: attempted X3 to sit EOB however c/o pain in R hip limiting pt's ability to come into sitting; pt declined attempting on L side of bed; max multimodal cues for sequencing and technique  Transfers                    Ambulation/Gait                 Stairs            Wheelchair Mobility    Modified Rankin (Stroke Patients Only)       Balance                                    Cognition Arousal/Alertness: Awake/alert Behavior During Therapy: WFL for tasks assessed/performed Overall Cognitive Status: Within Functional Limits for tasks assessed                      Exercises      General Comments        Pertinent Vitals/Pain Pain Assessment: Faces Faces Pain Scale: Hurts even more Pain Location: R hip with mobility Pain Descriptors / Indicators: Grimacing;Guarding;Moaning;Sore Pain Intervention(s): Limited activity within patient's tolerance;Monitored during session;Premedicated  before session;Repositioned    Home Living                      Prior Function            PT Goals (current goals can now be found in the care plan section) Acute Rehab PT Goals Patient Stated Goal: get strength back PT Goal Formulation: With patient Time For Goal Achievement: 11/10/16 Potential to Achieve Goals: Good Progress towards PT goals: Not progressing toward goals - comment (pain limiting mobility this session)    Frequency    Min 3X/week      PT Plan Current plan remains appropriate    Co-evaluation             End of Session Equipment Utilized During Treatment: Gait belt Activity Tolerance: Patient limited by pain Patient left: with call bell/phone within reach;in bed     Time: [id number] PT Time Calculation (min) (ACUTE ONLY): 25 min  Charges:  $Therapeutic Activity: 23-37 mins                    G Codes:      Jeffery [name] Jeffery [name], PTA Pager: [phone]   10/30/2016, 2:05 PM

## 2016-10-30 NOTE — Progress Notes (Signed)
Initial Nutrition Assessment  DOCUMENTATION CODES:   Obesity unspecified  INTERVENTION:  Provide Boost Breeze po BID, each supplement provides 250 kcal and 9 grams of protein.  Provide 30 ml Prostat po TID, each supplement provides 100 kcal and 15 grams of protein.   Encourage adequate PO intake.   RD to continue to monitor.   NUTRITION DIAGNOSIS:   Increased nutrient needs related to chronic illness as evidenced by estimated needs.  GOAL:   Patient will meet greater than or equal to 90% of their needs  MONITOR:   PO intake, Supplement acceptance, Diet advancement, Labs, Weight trends, Skin, I & O's  REASON FOR ASSESSMENT:   Consult Assessment of nutrition requirement/status (wound healing, poor po)  ASSESSMENT:   65 y.o. male PMHx Liver cirrhosis with Ascites, Es41ophageal varices, Portal Hypertensive gastropathy, Hemolytic anemia, Thrombocytopenia, DM type II uncontrolled with complications, S/P right AKA for DM foot infection, Presented to the emergency room with complaints of abdominal pain and frequent diarrhea During the last few weeks he's been having frequent loose stools that were worsening in frequency over the last 2 - 3 days  to the point that he is unable to eat because food intake causes him to run to the bathroom continuously and he has become severely weak.. Pt had paracentesis 2/6 with 3.3L removed. 2/7 Left first ray amputation. Pt with FOBT + anemia. Pt C.Diff negative.  Pt reports no po intake today due to abdominal pains. Pt reports last meal eaten was breakfast this yesterday and reports it soon after caused abdominal pains thus unable to eat since then. RD unable to obtain further nutrition information as pt soon after developed n/v. Unable to complete Nutrition-Focused physical exam at this time. RD to revisit.   Pt is knowledgeable that he needs to consume protein to aid in wound healing. RD to order nutritional supplements to aid in adequate nutrition  (Boost Breeze and prostat as pt on clear liquid diet). Weight has been increasing since admission likely related to fluid status. Net I/O since 10/23/16 is +15L.   Labs and medications reviewed.   Diet Order:  Diet clear liquid Room service appropriate? Yes; Fluid consistency: Thin  Skin:  Wound (see comment) (Incision on L toe w/ VAC, wound on buttocks, R hip, back)  Last BM:  2/12  Height:   Ht Readings from Last 1 Encounters:  05/18/16 6\' 1"  (1.854 m)    Weight:   Wt Readings from Last 1 Encounters:  10/30/16 255 lb 9.6 oz (115.9 kg)  Admit weight  230 lb (104 kg)  Ideal Body Weight:  77 kg (adjusted for R AKA)  BMI:  Body mass index is 33.72 kg/m.  Estimated Nutritional Needs:   Kcal:  2200-2400  Protein:  110-125 grams  Fluid:  Per MD  EDUCATION NEEDS:   Education needs no appropriate at this time  Roslyn SmilingStephanie Lyric Rossano, MS, RD, LDN Pager # [phone] After hours/ weekend pager # [phone]

## 2016-10-30 NOTE — Progress Notes (Signed)
Palliative Medicine consult noted. Due to high referral volume, there may be a delay seeing this patient. Please call the Palliative Medicine Team office at [phone] if recommendations are needed in the interim.  Thank you for inviting us to see this patient.  Aleksi Brummet G. Willmar Stockinger, RN, BSN, CHPN 10/30/2016 2:45 PM Cell [phone] 8:00-4:00 Monday-Friday Office [phone] 

## 2016-10-31 ENCOUNTER — Inpatient Hospital Stay (HOSPITAL_COMMUNITY): Payer: BC Managed Care – PPO

## 2016-10-31 DIAGNOSIS — K7210 Chronic hepatic failure without coma: Secondary | ICD-10-CM

## 2016-10-31 DIAGNOSIS — K922 Gastrointestinal hemorrhage, unspecified: Secondary | ICD-10-CM

## 2016-10-31 DIAGNOSIS — Z7189 Other specified counseling: Secondary | ICD-10-CM

## 2016-10-31 DIAGNOSIS — Z515 Encounter for palliative care: Secondary | ICD-10-CM

## 2016-10-31 DIAGNOSIS — R195 Other fecal abnormalities: Secondary | ICD-10-CM

## 2016-10-31 LAB — GASTROINTESTINAL PANEL BY PCR, STOOL (REPLACES STOOL CULTURE)
ADENOVIRUS F40/41: NOT DETECTED
Astrovirus: NOT DETECTED
CAMPYLOBACTER SPECIES: NOT DETECTED
CRYPTOSPORIDIUM: NOT DETECTED
CYCLOSPORA CAYETANENSIS: NOT DETECTED
ENTEROPATHOGENIC E COLI (EPEC): NOT DETECTED
Entamoeba histolytica: NOT DETECTED
Enteroaggregative E coli (EAEC): NOT DETECTED
Enterotoxigenic E coli (ETEC): NOT DETECTED
Giardia lamblia: NOT DETECTED
Norovirus GI/GII: NOT DETECTED
PLESIMONAS SHIGELLOIDES: NOT DETECTED
ROTAVIRUS A: NOT DETECTED
SALMONELLA SPECIES: NOT DETECTED
SHIGELLA/ENTEROINVASIVE E COLI (EIEC): NOT DETECTED
Sapovirus (I, II, IV, and V): NOT DETECTED
Shiga like toxin producing E coli (STEC): NOT DETECTED
VIBRIO SPECIES: NOT DETECTED
Vibrio cholerae: NOT DETECTED
YERSINIA ENTEROCOLITICA: NOT DETECTED

## 2016-10-31 LAB — BASIC METABOLIC PANEL
ANION GAP: 10 (ref 5–15)
BUN: 35 mg/dL — ABNORMAL HIGH (ref 6–20)
CALCIUM: 8.4 mg/dL — AB (ref 8.9–10.3)
CHLORIDE: 107 mmol/L (ref 101–111)
CO2: 18 mmol/L — AB (ref 22–32)
Creatinine, Ser: 2.5 mg/dL — ABNORMAL HIGH (ref 0.61–1.24)
GFR calc non Af Amer: 25 mL/min — ABNORMAL LOW (ref >60)
GFR, EST AFRICAN AMERICAN: 29 mL/min — AB (ref >60)
GLUCOSE: 118 mg/dL — AB (ref 65–99)
POTASSIUM: 4.5 mmol/L (ref 3.5–5.1)
Sodium: 135 mmol/L (ref 135–145)

## 2016-10-31 LAB — CBC
HCT: 17.3 % — ABNORMAL LOW (ref 39.0–52.0)
Hemoglobin: 6 g/dL — CL (ref 13.0–17.0)
MCH: 34.3 pg — AB (ref 26.0–34.0)
MCHC: 34.7 g/dL (ref 30.0–36.0)
MCV: 98.9 fL (ref 78.0–100.0)
PLATELETS: 73 10*3/uL — AB (ref 150–400)
RBC: 1.75 MIL/uL — AB (ref 4.22–5.81)
RDW: 15.3 % (ref 11.5–15.5)
WBC: 7.3 10*3/uL (ref 4.0–10.5)

## 2016-10-31 LAB — GLUCOSE, CAPILLARY
GLUCOSE-CAPILLARY: 116 mg/dL — AB (ref 65–99)
GLUCOSE-CAPILLARY: 124 mg/dL — AB (ref 65–99)
Glucose-Capillary: 111 mg/dL — ABNORMAL HIGH (ref 65–99)
Glucose-Capillary: 119 mg/dL — ABNORMAL HIGH (ref 65–99)
Glucose-Capillary: 134 mg/dL — ABNORMAL HIGH (ref 65–99)
Glucose-Capillary: 153 mg/dL — ABNORMAL HIGH (ref 65–99)

## 2016-10-31 LAB — PROTIME-INR
INR: 2.9
PROTHROMBIN TIME: 31 s — AB (ref 11.4–15.2)

## 2016-10-31 LAB — OSMOLALITY: Osmolality: 303 mOsm/kg — ABNORMAL HIGH (ref 275–295)

## 2016-10-31 LAB — DIRECT ANTIGLOBULIN TEST (NOT AT ARMC)
DAT, COMPLEMENT: NEGATIVE
DAT, IGG: NEGATIVE

## 2016-10-31 LAB — PREPARE RBC (CROSSMATCH)

## 2016-10-31 LAB — MAGNESIUM: Magnesium: 2 mg/dL (ref 1.7–2.4)

## 2016-10-31 LAB — PREALBUMIN

## 2016-10-31 LAB — PATHOLOGIST SMEAR REVIEW

## 2016-10-31 MED ORDER — ALBUMIN HUMAN 25 % IV SOLN
25.0000 g | Freq: Four times a day (QID) | INTRAVENOUS | Status: DC
  Administered 2016-10-31 – 2016-11-01 (×3): 25 g via INTRAVENOUS
  Filled 2016-10-31 (×5): qty 100

## 2016-10-31 MED ORDER — LORAZEPAM 2 MG/ML IJ SOLN: 1.0000 mg | INTRAMUSCULAR | Status: DC | PRN

## 2016-10-31 MED ORDER — FLUCONAZOLE IN SODIUM CHLORIDE 100-0.9 MG/50ML-% IV SOLN
100.0000 mg | INTRAVENOUS | Status: DC
Start: 2016-10-31 — End: 2016-11-01
  Administered 2016-10-31: 100 mg via INTRAVENOUS
  Filled 2016-10-31 (×2): qty 50

## 2016-10-31 MED ORDER — LOPERAMIDE HCL 2 MG PO CAPS
2.0000 mg | ORAL_CAPSULE | Freq: Three times a day (TID) | ORAL | Status: DC
  Administered 2016-11-01 – 2016-11-08 (×19): 2 mg via ORAL
  Filled 2016-10-31 (×22): qty 1

## 2016-10-31 MED ORDER — MORPHINE SULFATE (PF) 2 MG/ML IV SOLN: 0.5000 mg | INTRAVENOUS | Status: DC | PRN

## 2016-10-31 MED ORDER — DOXYCYCLINE HYCLATE 100 MG IV SOLR
100.0000 mg | Freq: Two times a day (BID) | INTRAVENOUS | Status: DC
  Administered 2016-10-31 – 2016-11-06 (×11): 100 mg via INTRAVENOUS
  Filled 2016-10-31 (×14): qty 100

## 2016-10-31 NOTE — Progress Notes (Signed)
[facility] Progress Note  Patient: Jeffery [name] WUJ:[id number]   PCP: Julian Hy, MD DOB: 1950/09/26   DOA: 10/23/2016   DOS: 10/31/2016   Date of Service: the patient was seen and examined on 10/31/2016   Subjective: Denies any acute complaint but continues to have vomiting and diarrhea. Pain in the abdomen is better. Patient is questioning about his prognosis.  Brief hospital course: Pt. with PMH of cirrhosis with ascites, portal hypertension with gastropathy and esophageal varices, hemolytic anemia, chronic thrombocytopenia, right AKA; admitted on 10/23/2016, with complaint of diarrhea, was found to have suspected viral gastroenteritis, GI bleed, left toe osteomyelitis and acute kidney injury. Initially admitted in the step down unit and now transferred to MedSurg floor since 10/30/2016. Currently further plan is continue GI workup, monitor response to change antibiotics.  Assessment and Plan: 1. Persistent diarrhea C. difficile negative. FOBT positive. CT scan shows pancolitis versus possible hypoalbuminemia causing wall thickening. Requirement of 14 day treatment course for antibiotics is not helping his diarrhea as well. GI has been consulted. I would add probiotic, monitor response. GI started the patient on Imodium scheduled. We'll monitor. GI pathogen panel is pending  2. 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 Pathology report shows clean margins. Discuss with infectious disease, recommended to continue 14 days of antibiotics. Patient was on doxycycline and IV Zosyn. Cultures so far negative. I will change to IV doxycycline and IV Unasyn while in the hospital. Probiotics added. Last day of antibiotic 11/07/2016  3. Cirrhosis of the liver. Portal hypertensive gastropathy, esophageal viruses. Possible hematemesis as well as melena. Acute blood loss anemia  GI consulted, appreciate input. Transfuse for hemoglobin less than 7. GI considering  PPI. Nadolol dose has been reduced from 40 mg with 20 mg due to hypotension and acute kidney injury. Hypoalbuminemia, low fibrinogen level, elevated INR and chronic cytopenia all secondary to cirrhosis. S/P paracentesis 10/24/2016 with 3 L of fluid removed. No evidence of SBP. Did not receive albumin after that. Will give 1 dose of albumin today. GI considering Aldactone and Lasix, would not recommend its use in the presence of acute kidney injury Hold IV fluids. On clear liquid diet  4. chronic thrombocytopenia Platelet has been low since last one year. Likely in the setting of liver disease. Elevated INR and low fibrinogen due to liver disease as well. I highly doubt presence of DIC TTP at present. 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 Peripheral smear shows leukocytes without any schistocytes. Monitor for now. Stop aspirin and use mechanical prophylaxis for DVT.  5. Acute kidney injury. Baseline renal function 1.3, on presentation 2.49, worsening again, probably multifactorial Due to hypotension and dehydration from diarrhea. Unclear whether surgery as well as paracentesis has any role to play in persistence of renal dysfunction. Reduce the dose of nadolol. Getting IV albumin.  6. Type 2 diabetes mellitus. The patient remains on clear liquid diet and will continue every 4 hours CBG with sliding scale. Monitor. A1c 5.8 on admission.  Bowel regimen: last BM 10/30/2016 Diet: clear liquid diet DVT Prophylaxis: mechanical compression device  Advance goals of care discussion: I discussed with patient, his prognosis is significantly guarded in the setting of GI bleed, acute kidney injury, wound infection. Persistent nausea vomiting and diarrhea is also making things worse. Pt is also malnourished, which also render's poor prognosis. Palliative care also discussed with the patient, code status has been changed from full code to DNR/DNI. We'll monitor.  Family  Communication: no family was  present at bedside, at the time of interview.  Disposition:  Discharge to SNF. Expected discharge date: 11/03/2016, resolution GI symptoms and H&H  Consultants: Gastroenterology, orthopedics, palliative care Procedures: First ray amputation of the left leg.  Antibiotics: Anti-infectives    Start     Dose/Rate Route Frequency Ordered Stop   10/31/16 1715  doxycycline (VIBRAMYCIN) 100 mg in dextrose 5 % 250 mL IVPB     100 mg 125 mL/hr over 120 Minutes Intravenous Every 12 hours 10/31/16 1710     10/31/16 1715  fluconazole (DIFLUCAN) IVPB 100 mg     100 mg 50 mL/hr over 60 Minutes Intravenous Every 24 hours 10/31/16 1710 11/03/16 1714   10/31/16 1000  fluconazole (DIFLUCAN) tablet 100 mg  Status:  Discontinued     100 mg Oral Daily 10/30/16 1239 10/31/16 1709   10/30/16 2200  doxycycline (VIBRA-TABS) tablet 100 mg  Status:  Discontinued     100 mg Oral Every 12 hours 10/30/16 1238 10/31/16 1709   10/30/16 1400  Ampicillin-Sulbactam (UNASYN) 3 g in sodium chloride 0.9 % 100 mL IVPB     3 g 200 mL/hr over 30 Minutes Intravenous Every 6 hours 10/30/16 1333 11/06/16 1359   10/30/16 0800  fluconazole (DIFLUCAN) IVPB 100 mg  Status:  Discontinued     100 mg 50 mL/hr over 60 Minutes Intravenous Every 24 hours 10/29/16 0826 10/30/16 1238   10/29/16 0800  fluconazole (DIFLUCAN) IVPB 200 mg     200 mg 100 mL/hr over 60 Minutes Intravenous  Once 10/29/16 0739 10/29/16 0914   10/24/16 0600  piperacillin-tazobactam (ZOSYN) IVPB 3.375 g  Status:  Discontinued    Comments:  Zosyn 3.375 g IV q8h for CrCl > 10 mL/min   3.375 g 12.5 mL/hr over 240 Minutes Intravenous Every 8 hours 10/24/16 0130 10/30/16 1231   10/23/16 2330  doxycycline (VIBRAMYCIN) 100 mg in dextrose 5 % 250 mL IVPB  Status:  Discontinued     100 mg 125 mL/hr over 120 Minutes Intravenous Every 12 hours 10/23/16 2322 10/30/16 1231   10/23/16 2330  piperacillin-tazobactam (ZOSYN) IVPB 3.375 g  Status:   Discontinued     3.375 g 12.5 mL/hr over 240 Minutes Intravenous Every 8 hours 10/23/16 2323 10/24/16 0130        Objective: Physical Exam: Vitals:   10/31/16 1037 10/31/16 1115 10/31/16 1430 10/31/16 1510  BP: (!) 103/51 (!) 107/48 (!) 139/55 (!) 114/55  Pulse: 61 (!) 59 62 64  Resp: 18 18 18 18   Temp: 97.5 F (36.4 C) 97.7 F (36.5 C) 98.1 F (36.7 C) 98.1 F (36.7 C)  TempSrc: Oral Oral Oral Oral  SpO2: 97% 98% 96% 98%  Weight:        Intake/Output Summary (Last 24 hours) at 10/31/16 1711 Last data filed at 10/31/16 0900  Gross per 24 hour  Intake                0 ml  Output              550 ml  Net             -550 ml   Filed Weights   10/29/16 0500 10/30/16 0702 10/31/16 0419  Weight: 107.2 kg (236 lb 4.8 oz) 115.9 kg (255 lb 9.6 oz) 113.8 kg (250 lb 12.8 oz)    General: Alert, Awake and Oriented to Time, Place and Person. Appear in mild distress, affect appropriate Eyes: PERRL, Conjunctiva normal ENT: Oral Mucosa clear  moist. Neck: difficult to assess JVD, no Abnormal Mass Or lumps Cardiovascular: S1 and S2 Present, no Murmur, Respiratory: Bilateral Air entry equal and Decreased, no use of accessory muscle, Clear to Auscultation, no Crackles, no wheezes Abdomen: Bowel Sound present, Soft and mild tenderness Skin: no redness, no Rash, no induration Extremities: bilateral Pedal edema, no calf tenderness Neurologic: Grossly no focal neuro deficit. Bilaterally Equal motor strength  Data Reviewed: CBC:  Recent Labs Lab 10/26/16 0252 10/27/16 0948 10/29/16 0226 10/30/16 0315 10/30/16 0708 10/31/16 0755  WBC 4.5 6.4 5.2 7.6  --  7.3  NEUTROABS  --   --   --   --  6.2  --   HGB 9.2* 9.8* 7.8* 7.8*  --  6.0*  HCT 26.9* 29.0* 22.3* 22.6*  --  17.3*  MCV 99.6 100.0 97.8 97.8  --  98.9  PLT 75* 92* 71* 87*  --  73*   Basic Metabolic Panel:  Recent Labs Lab 10/26/16 0252 10/27/16 0948 10/29/16 0226 10/30/16 0315 10/31/16 0341  NA 132* 134* 134* 133*  135  K 4.3 4.3 4.3 4.6 4.5  CL 106 108 107 107 107  CO2 19* 18* 21* 19* 18*  GLUCOSE 131* 131* 123* 139* 118*  BUN 30* 24* 22* 26* 35*  CREATININE 1.77* 1.81* 1.81* 1.91* 2.50*  CALCIUM 7.9* 8.0* 7.9* 8.1* 8.4*  MG  --  1.7 1.6* 2.0 2.0    Liver Function Tests:  Recent Labs Lab 10/26/16 0252  AST 61*  ALT 26  ALKPHOS 52  BILITOT 1.4*  PROT 6.0*  ALBUMIN 1.5*   No results for input(s): LIPASE, AMYLASE in the last 168 hours.  Recent Labs Lab 10/29/16 0841  AMMONIA 13   Coagulation Profile:  Recent Labs Lab 10/30/16 0818 10/31/16 0341  INR 2.50 2.90   Cardiac Enzymes: No results for input(s): CKTOTAL, CKMB, CKMBINDEX, TROPONINI in the last 168 hours. BNP (last 3 results) No results for input(s): PROBNP in the last 8760 hours.  CBG:  Recent Labs Lab 10/31/16 0019 10/31/16 0359 10/31/16 0758 10/31/16 1136 10/31/16 1621  GLUCAP 124* 111* 116* 119* 134*    Studies: [place] Renal  Result Date: 10/31/2016 CLINICAL DATA:  Acute kidney injury. EXAM: RENAL / URINARY TRACT ULTRASOUND COMPLETE COMPARISON:  Ultrasound of September 01, 2016. FINDINGS: Right Kidney: Length: 13.2 cm. Echogenicity within normal limits. No mass or hydronephrosis visualized. Left Kidney: Length: 13.5 cm. 5.2 cm simple cyst is seen in lower pole. 2.3 cm simple cyst is seen in upper pole. Echogenicity within normal limits. No mass or hydronephrosis visualized. Bladder: Appears normal for degree of bladder distention. Ascites is noted. IMPRESSION: Ascites.  Simple left renal cysts.  No other abnormality seen. Electronically Signed   By: Lupita RaiderJames  Green Jr, M.D.   On: 10/31/2016 09:37   Dg Abd Portable 1v  Result Date: 10/30/2016 CLINICAL DATA:  Nausea and vomiting. Cholelithiasis and hypertension. EXAM: PORTABLE ABDOMEN - 1 VIEW COMPARISON:  CT abdomen from 10/23/2016 FINDINGS: Moderate gaseous distention of the stomach possibly representing gastroparesis. Scattered air containing nonobstructed,  nondistended large bowel. No significant small bowel dilatation. Calcification in the left upper quadrant is noted not clearly identified on the prior CT. It does not appear to represent nephrolithiasis. No definite calcified lymph node is seen on prior CT. Splenic vascular calcification might account for this. Mild degenerate change of the dorsal spine. IMPRESSION: Moderate gaseous distention of the stomach which may represent gastroparesis. No obstructive bowel gas pattern. No definite free air. Electronically  Signed   By: Tollie Eth M.D.   On: 10/30/2016 20:24     Scheduled Meds: . albumin human  25 g Intravenous Q6H  . ampicillin-sulbactam (UNASYN) IV  3 g Intravenous Q6H  . atorvastatin  40 mg Oral Daily  . brimonidine  1 drop Both Eyes Daily  . doxycycline (VIBRAMYCIN) IV  100 mg Intravenous Q12H  . feeding supplement  1 Container Oral BID BM  . feeding supplement (PRO-STAT SUGAR FREE 64)  30 mL Oral TID  . fluconazole (DIFLUCAN) IV  100 mg Intravenous Q24H  . insulin aspart  0-9 Units Subcutaneous TID WC & HS  . loperamide  2 mg Oral TID  . metoCLOPramide (REGLAN) injection  5 mg Intravenous Q8H  . nadolol  20 mg Oral Daily  . nystatin   Topical BID  . PARoxetine  20 mg Oral Daily  . saccharomyces boulardii  250 mg Oral BID   Continuous Infusions: PRN Meds: acetaminophen **OR** acetaminophen, alum & mag hydroxide-simeth, LORazepam, methocarbamol, morphine injection, naLOXone (NARCAN)  injection, ondansetron, promethazine, traZODone  Time spent: 30 minutes  Author: Lynden Oxford, MD [facility] Pager: [phone] 10/31/2016 5:11 PM  If 7PM-7AM, please contact night-coverage at [email or web address], password [facility]

## 2016-10-31 NOTE — Consult Note (Signed)
Consultation Note Date: 10/31/2016   Patient Name: Jeffery [name]  DOB: [birth year]  MRN: [id number]  Age / Sex: 66 y.o., male  PCP: Reynold Bowen, MD Referring Physician: Lavina Hamman, MD  Reason for Consultation: Establishing goals of care and Non pain symptom management  HPI/Patient Profile: 66 y.o. male  with past medical history of cirrhosis, ascites, esophageal varices, portal hypertensive gastropathy, hemolytic anemia, thrombocytopenia, BKS for DM foot infection asthma, admitted on 10/23/2016 with abdominal pain and diarrhea. C-diff negative. Workup reveals end stage liver disease (PT 31, Albumin 1.5, refractory ascites), osteomyelitis of L big toe (s/t debridement during this admission).    Clinical Assessment and Goals of Care: Met with patient at bedside. He has persistent nausea but is refusing meds. States nausea medication makes his nausea worse. He complains of the room spinning and keeps his eyes closed and that helps. He has severe pain in his back that is sharp, constant, relieved at times with IV morphine, but returns before next dose is due.  He tells me he has been told he has six months to a year of life, but he feels it is less than six months and I agree with him. 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  Life review reveals he was married and his spouse cheated on him. He became very depressed during this time but has close friends that helped him. He comes back to this subject frequently during our discussion, he does not want his ex wife contacted about his condition. He was finally over this trauma when he got the diagnosis of his failed liver.  He notes having health problems his entire  life and states he expected to die young. Multiple family members have died young deaths.  We discussed advanced directives and code status- he has a friend in [place]- Richard Bach who he would like to designate as his HCPOA. He desires DNR code status.   Primary Decision Maker PATIENT    SUMMARY OF RECOMMENDATIONS --Advanced End stage liver disease- AEB PT 31; INR 2.9; Albumin 1.5; refractory ascites, thrombocytopenia, esophageal varices, now with possible hepatorenal syndrome--  -I am concerned his prognosis may be quite less than what he is thinking- will continue to monitor   -DNR -Offered to complete Adv Directives with him- will bring a copy to him- he wants to discuss with his friend -Lorazepam 28m- q4hr prn nausea -Increase morphine frequency to q2 hr prn -Dexamethasone- would be beneficial for symptoms of pain and nausea, however, I am reluctant to order this given his risk for infection d/t open surgical wound,  osteomyelitis- consider after more wound healing as occurred:   -Would recommend a loading dose of 137mIV x 1  -Dexamethasone 52m53mV BID x 1 day  -Dexamethasone 2mg59mD x 1 day  Code Status/Advance Care Planning:  DNR    Symptom Management:   As above  Palliative Prophylaxis:   Frequent Pain Assessment  Additional Recommendations (Limitations, Scope, Preferences):  Full Scope Treatment  Psycho-social/Spiritual:   Desire for further Chaplaincy support:No  Prognosis:   Unable to determine At best prognosis is less than six months- at worse he could have days to weeks- there is concern he may not survive this hospitalization  Discharge Planning: To Be Determined  Primary Diagnoses: Present on Admission: . C. difficile colitis . Hemolytic anemia ([place]) . Cirrhosis of liver with ascites ([place]) . Esophageal varices without bleeding ([place]) . Portal hypertensive gastropathy . Diarrhea . Noninfectious gastroenteritis . Gastrointestinal  hemorrhage . Controlled diabetes mellitus type 2 with complications ([place]) . Osteomyelitis of left foot ([place]) . Anxiety state . Somnolence . Moderate malnutrition ([place]) . AKI (acute kidney injury) ([place])   I have reviewed the medical record, interviewed the patient and family, and examined the patient. The following aspects are pertinent.  Past Medical History:  Diagnosis Date  . Arthritis   . Asthma   . Bilateral cataracts   . Blurred vision    improved now  . Cholelithiasis   . Diabetes mellitus    type II  . Eczema    inner wrist  . Hypertension   . Muscle cramps   . Necrotizing fasciitis ([place])   . Neuropathy ([place])   . Rash    wrist and hands since late june  . Retinal degeneration    Social History   Social History  . Marital status: Married    Spouse name: N/A  . Number of children: N/A  . Years of education: N/A   Social History Main Topics  . Smoking status: Never Smoker  . Smokeless tobacco: Never Used  . Alcohol use No  . Drug use: No  . Sexual activity: Not Asked     Comment: computer work. next of kin. Sister Gwenette Greet next of kin   Other Topics Concern  . None   Social History Narrative  . None   Family History  Problem Relation Age of Onset  . Cancer Mother     unknown type   Scheduled Meds: . ampicillin-sulbactam (UNASYN) IV  3 g Intravenous Q6H  . atorvastatin  40 mg Oral Daily  . brimonidine  1 drop Both Eyes Daily  . doxycycline  100 mg Oral Q12H  . feeding supplement  1 Container Oral BID BM  . feeding supplement (PRO-STAT SUGAR FREE 64)  30 mL Oral TID  . fluconazole  100 mg Oral Daily  . insulin aspart  0-9 Units Subcutaneous TID WC & HS  . metoCLOPramide (REGLAN) injection  5 mg Intravenous Q8H  . nadolol  20 mg Oral Daily  . nystatin   Topical BID  . PARoxetine  20 mg Oral Daily  . saccharomyces boulardii  250 mg Oral BID  . spironolactone  50 mg Oral Daily   Continuous Infusions: PRN Meds:.acetaminophen **OR**  acetaminophen, alum & mag hydroxide-simeth, LORazepam, methocarbamol, morphine injection, naLOXone (NARCAN)  injection, ondansetron, promethazine, traZODone Medications Prior to Admission:  Prior to Admission medications   Medication Sig Start Date End Date Taking? Authorizing Provider  aspirin 81 MG tablet Take 81 mg by mouth daily.   Yes Historical Provider, MD  atorvastatin (LIPITOR) 40 MG tablet Take 40 mg by mouth daily. 01/31/16  Yes Historical Provider, MD  brimonidine (ALPHAGAN P) 0.1 % SOLN Place 1 drop into both eyes daily.  Yes Historical Provider, MD  losartan (COZAAR) 50 MG tablet Take 50 mg by mouth daily.   Yes Historical Provider, MD  Multiple Vitamin (MULTIVITAMIN WITH MINERALS) TABS tablet Take 1 tablet by mouth daily.   Yes Historical Provider, MD  nadolol (CORGARD) 40 MG tablet Take 1 tablet (40 mg total) by mouth daily. 06/02/16  Yes Milus Banister, MD   No Known Allergies Review of Systems  Constitutional: Positive for activity change, appetite change and fatigue.  Respiratory: Positive for wheezing.   Gastrointestinal: Positive for abdominal distention and abdominal pain.  Musculoskeletal: Positive for back pain.  Hematological: Bruises/bleeds easily.    Physical Exam  Constitutional: He appears well-developed and well-nourished. No distress.  HENT:  Head: Normocephalic and atraumatic.  Cardiovascular: Normal rate and regular rhythm.   Pulmonary/Chest: He has wheezes.  Abdominal: He exhibits distension. There is tenderness.  Musculoskeletal: He exhibits edema.  Generalized weakness  Neurological:  Alert and oriented x 3- keeps eyes closed due to dizziness  Skin: Skin is warm and dry.  Scattered petechiae   Psychiatric: He has a normal mood and affect. His behavior is normal. Judgment and thought content normal.  Nursing note and vitals reviewed.   Vital Signs: BP (!) 97/55 (BP Location: Right Arm)   Pulse 61   Temp 97.4 F (36.3 C) (Oral)   Resp 20    Wt 113.8 kg (250 lb 12.8 oz)   SpO2 95%   BMI 33.09 kg/m  Pain Assessment: 0-10 POSS *See Group Information*: 1-Acceptable,Awake and alert Pain Score: 5    SpO2: SpO2: 95 % O2 Device:SpO2: 95 % O2 Flow Rate: .O2 Flow Rate (L/min): 2 L/min  IO: Intake/output summary:  Intake/Output Summary (Last 24 hours) at 10/31/16 1019 Last data filed at 10/31/16 0900  Gross per 24 hour  Intake              450 ml  Output              550 ml  Net             -100 ml    LBM: Last BM Date: 10/30/16 Baseline Weight: Weight: 104.4 kg (230 lb 1.6 oz) Most recent weight: Weight: 113.8 kg (250 lb 12.8 oz)     Palliative Assessment/Data:PPS: 20%   Flowsheet Rows   Flowsheet Row Most Recent Value  Intake Tab  Referral Department  Hospitalist  Unit at Time of Referral  Med/Surg Unit  Palliative Care Primary Diagnosis  Other (Comment) [cirrhosis]  Date Notified  10/29/16  Palliative Care Type  New Palliative care  Reason for referral  Clarify Goals of Care  Date of Admission  10/23/16  # of days IP prior to Palliative referral  6  Clinical Assessment  Psychosocial & Spiritual Assessment  Palliative Care Outcomes      Thank you for this consult. Palliative medicine will continue to follow and assist as needed.   Time Total: 80 minutes  Greater than 50%  of this time was spent counseling and coordinating care related to the above assessment and plan.  Signed by: Mariana Kaufman, AGNP-C Palliative Medicine    Please contact Palliative Medicine Team phone at [phone] for questions and concerns.  For individual provider: See Shea Evans

## 2016-10-31 NOTE — Progress Notes (Signed)
Patient has refused to take all scheduled medications today, other than albumin and two units of PRBCs. Patient stated that he was nauseous and RN offered patient ordered zofran, phenegran. RN also offered patient ordered ativan and morphine. Patient declined stating "they don't help. I just don't want to take anything." RN text paged Dr Allena KatzPatel to inform him early in the morning of patient's refusal to take scheduled medications and in the evening after patient had also refused reglan IV and the newly ordered imodium from the GI service. Patient voices no other complaints, laying comfortably in bed.

## 2016-10-31 NOTE — Progress Notes (Signed)
OT Cancellation Note  Patient Details Name: Jeffery [name] MRN: [id number] DOB: Mar 25, 1951   Cancelled Treatment:    Reason Eval/Treat Not Completed: Medical issues which prohibited therapy. Pt with low Hgb (6.2), not feeling well. RN in with pt and OT will hold off this a.m. and check back later  Jeffery [name], Jeffery [name] 10/31/2016, 9:52 AM

## 2016-10-31 NOTE — Progress Notes (Signed)
Daily Rounding Note  10/31/2016, 3:07 PM  LOS: 8 days   SUBJECTIVE:   Chief complaint: 10 to 12 episodes of diarrhea in last day.   No abd pain.  Poor appetite, not taking much of clear diet.        OBJECTIVE:         Vital signs in last 24 hours:    Temp:  [97.4 F (36.3 C)-98.1 F (36.7 C)] 98.1 F (36.7 C) (02/13 1430) Pulse Rate:  [59-62] 62 (02/13 1430) Resp:  [18-20] 18 (02/13 1430) BP: (91-139)/(48-55) 139/55 (02/13 1430) SpO2:  [94 %-98 %] 96 % (02/13 1430) Weight:  [113.8 kg (250 lb 12.8 oz)] 113.8 kg (250 lb 12.8 oz) (02/13 0419) Last BM Date: 10/31/16 Filed Weights   10/29/16 0500 10/30/16 0702 10/31/16 0419  Weight: 107.2 kg (236 lb 4.8 oz) 115.9 kg (255 lb 9.6 oz) 113.8 kg (250 lb 12.8 oz)   General: looks chronically ill and pale   Heart: RRR Chest: clear.  No labored breathing Abdomen: obese, soft, NT.    Extremities: LE edema Neuro/Psych:  Oriented x 3.  Alert.  Calm.   Intake/Output from previous day: 02/12 0701 - 02/13 0700 In: 3020 [P.O.:220; I.V.:1951; IV Piggyback:850] Out: 550 [Urine:350; Emesis/NG output:200]  Intake/Output this shift: No intake/output data recorded.  Lab Results:  Recent Labs  10/29/16 0226 10/30/16 0315 10/31/16 0755  WBC 5.2 7.6 7.3  HGB 7.8* 7.8* 6.0*  HCT 22.3* 22.6* 17.3*  PLT 71* 87* 73*   BMET  Recent Labs  10/29/16 0226 10/30/16 0315 10/31/16 0341  NA 134* 133* 135  K 4.3 4.6 4.5  CL 107 107 107  CO2 21* 19* 18*  GLUCOSE 123* 139* 118*  BUN 22* 26* 35*  CREATININE 1.81* 1.91* 2.50*  CALCIUM 7.9* 8.1* 8.4*   LFT No results for input(s): PROT, ALBUMIN, AST, ALT, ALKPHOS, BILITOT, BILIDIR, IBILI in the last 72 hours. PT/INR  Recent Labs  10/30/16 0818 10/31/16 0341  LABPROT 27.4* 31.0*  INR 2.50 2.90   Hepatitis Panel No results for input(s): HEPBSAG, HCVAB, HEPAIGM, HEPBIGM in the last 72 hours.  Studies/Results: [place]  Renal  Result Date: 10/31/2016 CLINICAL DATA:  Acute kidney injury. EXAM: RENAL / URINARY TRACT ULTRASOUND COMPLETE COMPARISON:  Ultrasound of September 01, 2016. FINDINGS: Right Kidney: Length: 13.2 cm. Echogenicity within normal limits. No mass or hydronephrosis visualized. Left Kidney: Length: 13.5 cm. 5.2 cm simple cyst is seen in lower pole. 2.3 cm simple cyst is seen in upper pole. Echogenicity within normal limits. No mass or hydronephrosis visualized. Bladder: Appears normal for degree of bladder distention. Ascites is noted. IMPRESSION: Ascites.  Simple left renal cysts.  No other abnormality seen. Electronically Signed   By: Lupita RaiderJames  Green Jr, M.D.   On: 10/31/2016 09:37   Dg Abd Portable 1v  Result Date: 10/30/2016 CLINICAL DATA:  Nausea and vomiting. Cholelithiasis and hypertension. EXAM: PORTABLE ABDOMEN - 1 VIEW COMPARISON:  CT abdomen from 10/23/2016 FINDINGS: Moderate gaseous distention of the stomach possibly representing gastroparesis. Scattered air containing nonobstructed, nondistended large bowel. No significant small bowel dilatation. Calcification in the left upper quadrant is noted not clearly identified on the prior CT. It does not appear to represent nephrolithiasis. No definite calcified lymph node is seen on prior CT. Splenic vascular calcification might account for this. Mild degenerate change of the dorsal spine. IMPRESSION: Moderate gaseous distention of the stomach which may represent gastroparesis. No obstructive bowel  gas pattern. No definite free air. Electronically Signed   By: Tollie Eth M.D.   On: 10/30/2016 20:24    ASSESMENT:   *  FOBT + stool.  Diarrhea.  C diff negative. Both stools and emesis are "dark" but he takes BID Iron.   Colonoscopy in 04/2016: normal.  Not having diarrhea then.   CT scan last week showed pan colonic wall thickening, probably reflective of his hypoalbuminemia.  *  Normocytic anemia.  Had same in 04/2016.  On oral iron BID at  home. Hemoglobin down 1.8 grams in the last 24 hours.  Received 2 of 2 PRBCs today.  Repeat Hgb in AM.    *  Cirrhosis, decompensated with ascites.  Coagulopathy.  Grade 2 esophageal varices and portal gastropathy on EGD 04/2016, on Nadolol since then.   S/p 3.3 liter tap.   Not on diuretics.   *  Thrombocytopenia chronic, splenomegaly.    *  Osteomyelitis.  S/p left Hallux amputation 1 week ago.  S/p right AKA.  On Diflucan, IV doxycycline, Zosyn  *  Hypoalbuminemia, probable protein calorie malnutrition.    PLAN   *  EGD tomorrow.  Started Immodium tid.      [name]  10/31/2016, 3:07 PM Pager: [phone]

## 2016-10-31 NOTE — Progress Notes (Signed)
Subjective: Foot doing ok.     Objective: Vital signs in last 24 hours: Temp:  [97.4 F (36.3 C)-98.2 F (36.8 C)] 97.7 F (36.5 C) (02/13 1115) Pulse Rate:  [59-61] 59 (02/13 1115) Resp:  [17-20] 18 (02/13 1115) BP: (91-107)/(48-55) 107/48 (02/13 1115) SpO2:  [94 %-98 %] 98 % (02/13 1115) Weight:  [250 lb 12.8 oz (113.8 kg)] 250 lb 12.8 oz (113.8 kg) (02/13 0419)  Intake/Output from previous day: 02/12 0701 - 02/13 0700 In: 3020 [P.O.:220; I.V.:1951; IV Piggyback:850] Out: 550 [Urine:350; Emesis/NG output:200] Intake/Output this shift: No intake/output data recorded.   Recent Labs  10/29/16 0226 10/30/16 0315 10/31/16 0755  HGB 7.8* 7.8* 6.0*    Recent Labs  10/30/16 0315 10/31/16 0755  WBC 7.6 7.3  RBC 2.31* 1.75*  HCT 22.6* 17.3*  PLT 87* 73*    Recent Labs  10/30/16 0315 10/31/16 0341  NA 133* 135  K 4.6 4.5  CL 107 107  CO2 19* 18*  BUN 26* 35*  CREATININE 1.91* 2.50*  GLUCOSE 139* 118*  CALCIUM 8.1* 8.4*    Recent Labs  10/30/16 0818 10/31/16 0341  INR 2.50 2.90    Exam:  Wound vac removed.  Wound healing good.  Minimal drainage.  Sutures intact.  Dry dressing applied.    Assessment/Plan: Continue present care.     Zonia KiefJames Govind Furey 10/31/2016, 1:19 PM

## 2016-10-31 NOTE — Progress Notes (Signed)
CRITICAL VALUE ALERT  Critical value received: Hgb 6.2  Date of notification:  10/31/16  Time of notification:  0505  Critical value read back: yes  Nurse who received alert:  Geannie RisenJay Ninette Cotta, RN  MD notified (1st page):  Merdis DelayK. Schorr, NP  Time of first page:  0510  MD notified (2nd page):  Time of second page:  Responding MD:  Merdis DelayK. Schorr, NP  Time MD responded:  [phone]

## 2016-11-01 ENCOUNTER — Encounter (HOSPITAL_COMMUNITY): Payer: Self-pay | Admitting: *Deleted

## 2016-11-01 ENCOUNTER — Inpatient Hospital Stay (HOSPITAL_COMMUNITY): Payer: BC Managed Care – PPO | Admitting: Anesthesiology

## 2016-11-01 ENCOUNTER — Encounter (HOSPITAL_COMMUNITY)
Admission: EM | Disposition: A | Discharge status: Skilled Nursing Facility | Payer: Self-pay | Source: Home / Self Care | Attending: Internal Medicine

## 2016-11-01 DIAGNOSIS — Z7189 Other specified counseling: Secondary | ICD-10-CM

## 2016-11-01 DIAGNOSIS — K7290 Hepatic failure, unspecified without coma: Secondary | ICD-10-CM

## 2016-11-01 DIAGNOSIS — K2210 Ulcer of esophagus without bleeding: Secondary | ICD-10-CM

## 2016-11-01 DIAGNOSIS — K2971 Gastritis, unspecified, with bleeding: Secondary | ICD-10-CM

## 2016-11-01 DIAGNOSIS — K7210 Chronic hepatic failure without coma: Secondary | ICD-10-CM

## 2016-11-01 DIAGNOSIS — Z515 Encounter for palliative care: Secondary | ICD-10-CM

## 2016-11-01 DIAGNOSIS — K921 Melena: Secondary | ICD-10-CM

## 2016-11-01 DIAGNOSIS — K2981 Duodenitis with bleeding: Secondary | ICD-10-CM

## 2016-11-01 DIAGNOSIS — K269 Duodenal ulcer, unspecified as acute or chronic, without hemorrhage or perforation: Secondary | ICD-10-CM

## 2016-11-01 HISTORY — PX: ESOPHAGOGASTRODUODENOSCOPY (EGD) WITH PROPOFOL: SHX5813

## 2016-11-01 LAB — RENAL FUNCTION PANEL
ALBUMIN: 2.5 g/dL — AB (ref 3.5–5.0)
Anion gap: 11 (ref 5–15)
BUN: 45 mg/dL — ABNORMAL HIGH (ref 6–20)
CO2: 18 mmol/L — ABNORMAL LOW (ref 22–32)
CREATININE: 2.45 mg/dL — AB (ref 0.61–1.24)
Calcium: 8.6 mg/dL — ABNORMAL LOW (ref 8.9–10.3)
Chloride: 107 mmol/L (ref 101–111)
GFR, EST AFRICAN AMERICAN: 30 mL/min — AB (ref >60)
GFR, EST NON AFRICAN AMERICAN: 26 mL/min — AB (ref >60)
Glucose, Bld: 150 mg/dL — ABNORMAL HIGH (ref 65–99)
PHOSPHORUS: 3.7 mg/dL (ref 2.5–4.6)
Potassium: 4.5 mmol/L (ref 3.5–5.1)
SODIUM: 136 mmol/L (ref 135–145)

## 2016-11-01 LAB — AFP TUMOR MARKER: AFP-Tumor Marker: 1.2 ng/mL (ref 0.0–8.3)

## 2016-11-01 LAB — CBC
HCT: 20.7 % — ABNORMAL LOW (ref 39.0–52.0)
Hemoglobin: 7.1 g/dL — ABNORMAL LOW (ref 13.0–17.0)
MCH: 32.1 pg (ref 26.0–34.0)
MCHC: 34.3 g/dL (ref 30.0–36.0)
MCV: 93.7 fL (ref 78.0–100.0)
PLATELETS: 67 10*3/uL — AB (ref 150–400)
RBC: 2.21 MIL/uL — AB (ref 4.22–5.81)
RDW: 17.5 % — AB (ref 11.5–15.5)
WBC: 9.3 10*3/uL (ref 4.0–10.5)

## 2016-11-01 LAB — GLUCOSE, CAPILLARY
GLUCOSE-CAPILLARY: 145 mg/dL — AB (ref 65–99)
GLUCOSE-CAPILLARY: 165 mg/dL — AB (ref 65–99)
GLUCOSE-CAPILLARY: 174 mg/dL — AB (ref 65–99)
Glucose-Capillary: 156 mg/dL — ABNORMAL HIGH (ref 65–99)

## 2016-11-01 LAB — CREATININE, URINE, RANDOM: CREATININE, URINE: 188.93 mg/dL

## 2016-11-01 LAB — PROTIME-INR
INR: 3.15
Prothrombin Time: 33 seconds — ABNORMAL HIGH (ref 11.4–15.2)

## 2016-11-01 LAB — SODIUM, URINE, RANDOM

## 2016-11-01 LAB — OSMOLALITY, URINE: OSMOLALITY UR: 472 mosm/kg (ref 300–900)

## 2016-11-01 LAB — PREPARE RBC (CROSSMATCH)

## 2016-11-01 SURGERY — ESOPHAGOGASTRODUODENOSCOPY (EGD) WITH PROPOFOL
Anesthesia: Monitor Anesthesia Care

## 2016-11-01 MED ORDER — PANTOPRAZOLE SODIUM 40 MG IV SOLR
40.0000 mg | Freq: Two times a day (BID) | INTRAVENOUS | Status: DC
  Administered 2016-11-01 – 2016-11-02 (×3): 40 mg via INTRAVENOUS
  Filled 2016-11-01 (×3): qty 40

## 2016-11-01 MED ORDER — SUCRALFATE 1 GM/10ML PO SUSP
1.0000 g | Freq: Three times a day (TID) | ORAL | Status: DC
  Administered 2016-11-01 – 2016-11-08 (×25): 1 g via ORAL
  Filled 2016-11-01 (×22): qty 10

## 2016-11-01 MED ORDER — SODIUM CHLORIDE 0.9 % IV SOLN
Freq: Once | INTRAVENOUS | Status: AC
  Administered 2016-11-01: 10 mL/h via INTRAVENOUS

## 2016-11-01 MED ORDER — METOCLOPRAMIDE HCL 5 MG/ML IJ SOLN
5.0000 mg | Freq: Four times a day (QID) | INTRAMUSCULAR | Status: DC
  Administered 2016-11-01: 5 mg via INTRAVENOUS
  Filled 2016-11-01 (×7): qty 2

## 2016-11-01 MED ORDER — PROPOFOL 10 MG/ML IV BOLUS
INTRAVENOUS | Status: DC | PRN
  Administered 2016-11-01: 20 mg via INTRAVENOUS

## 2016-11-01 MED ORDER — PHENYLEPHRINE HCL 10 MG/ML IJ SOLN
INTRAVENOUS | Status: DC | PRN
  Administered 2016-11-01: 50 ug/min via INTRAVENOUS

## 2016-11-01 MED ORDER — PROPOFOL 500 MG/50ML IV EMUL
INTRAVENOUS | Status: DC | PRN
  Administered 2016-11-01: 100 ug/kg/min via INTRAVENOUS

## 2016-11-01 MED ORDER — SODIUM CHLORIDE 0.9 % IV SOLN
INTRAVENOUS | Status: DC
  Administered 2016-11-01: 09:00:00 via INTRAVENOUS

## 2016-11-01 MED ORDER — FLUCONAZOLE IN SODIUM CHLORIDE 100-0.9 MG/50ML-% IV SOLN
100.0000 mg | INTRAVENOUS | Status: DC
Start: 2016-11-01 — End: 2016-11-08
  Administered 2016-11-02 – 2016-11-07 (×6): 100 mg via INTRAVENOUS
  Filled 2016-11-01 (×8): qty 50

## 2016-11-01 NOTE — Anesthesia Postprocedure Evaluation (Signed)
Anesthesia Post Note  Patient: Doran Renteria  Procedure(s) Performed: Procedure(s) (LRB): ESOPHAGOGASTRODUODENOSCOPY (EGD) WITH PROPOFOL (N/A)  Patient location during evaluation: Endoscopy Anesthesia Type: MAC Level of consciousness: awake and alert Pain management: pain level controlled Vital Signs Assessment: post-procedure vital signs reviewed and stable Respiratory status: spontaneous breathing, nonlabored ventilation, respiratory function stable and patient connected to nasal cannula oxygen Cardiovascular status: stable and blood pressure returned to baseline Anesthetic complications: no       Last Vitals:  Vitals:   11/01/16 1050 11/01/16 1100  BP: (!) 115/59 (!) 122/50  Pulse: 68 66  Resp: 14 10  Temp:      Last Pain:  Vitals:   11/01/16 1046  TempSrc: Oral  PainSc:                  Montez Hageman

## 2016-11-01 NOTE — Anesthesia Procedure Notes (Signed)
Procedure Name: MAC Date/Time: 11/01/2016 10:13 AM Performed by: Kyung Rudd Pre-anesthesia Checklist: Patient identified, Emergency Drugs available, Suction available and Patient being monitored Patient Re-evaluated:Patient Re-evaluated prior to inductionOxygen Delivery Method: Nasal cannula Intubation Type: IV induction Placement Confirmation: positive ETCO2

## 2016-11-01 NOTE — H&P (View-Only) (Signed)
Daily Rounding Note  10/31/2016, 3:07 PM  LOS: 8 days   SUBJECTIVE:   Chief complaint: 10 to 12 episodes of diarrhea in last day.   No abd pain.  Poor appetite, not taking much of clear diet.        OBJECTIVE:         Vital signs in last 24 hours:    Temp:  [97.4 F (36.3 C)-98.1 F (36.7 C)] 98.1 F (36.7 C) (02/13 1430) Pulse Rate:  [59-62] 62 (02/13 1430) Resp:  [18-20] 18 (02/13 1430) BP: (91-139)/(48-55) 139/55 (02/13 1430) SpO2:  [94 %-98 %] 96 % (02/13 1430) Weight:  [113.8 kg (250 lb 12.8 oz)] 113.8 kg (250 lb 12.8 oz) (02/13 0419) Last BM Date: 10/31/16 Filed Weights   10/29/16 0500 10/30/16 0702 10/31/16 0419  Weight: 107.2 kg (236 lb 4.8 oz) 115.9 kg (255 lb 9.6 oz) 113.8 kg (250 lb 12.8 oz)   General: looks chronically ill and pale   Heart: RRR Chest: clear.  No labored breathing Abdomen: obese, soft, NT.    Extremities: LE edema Neuro/Psych:  Oriented x 3.  Alert.  Calm.   Intake/Output from previous day: 02/12 0701 - 02/13 0700 In: 3020 [P.O.:220; I.V.:1950; IV Piggyback:850] Out: 550 [Urine:350; Emesis/NG output:200]  Intake/Output this shift: No intake/output data recorded.  Lab Results:  Recent Labs  10/29/16 0226 10/30/16 0315 10/31/16 0755  WBC 5.2 7.6 7.3  HGB 7.8* 7.8* 6.0*  HCT 22.3* 22.6* 17.3*  PLT 71* 87* 73*   BMET  Recent Labs  10/29/16 0226 10/30/16 0315 10/31/16 0341  NA 134* 133* 135  K 4.3 4.6 4.5  CL 107 107 107  CO2 21* 19* 18*  GLUCOSE 123* 139* 118*  BUN 22* 26* 35*  CREATININE 1.81* 1.91* 2.50*  CALCIUM 7.9* 8.1* 8.4*   LFT No results for input(s): PROT, ALBUMIN, AST, ALT, ALKPHOS, BILITOT, BILIDIR, IBILI in the last 72 hours. PT/INR  Recent Labs  10/30/16 0818 10/31/16 0341  LABPROT 27.4* 31.0*  INR 2.50 2.90   Hepatitis Panel No results for input(s): HEPBSAG, HCVAB, HEPAIGM, HEPBIGM in the last 72 hours.  Studies/Results: [place]  Renal  Result Date: 10/31/2016 CLINICAL DATA:  Acute kidney injury. EXAM: RENAL / URINARY TRACT ULTRASOUND COMPLETE COMPARISON:  Ultrasound of September 01, 2016. FINDINGS: Right Kidney: Length: 13.2 cm. Echogenicity within normal limits. No mass or hydronephrosis visualized. Left Kidney: Length: 13.5 cm. 5.2 cm simple cyst is seen in lower pole. 2.3 cm simple cyst is seen in upper pole. Echogenicity within normal limits. No mass or hydronephrosis visualized. Bladder: Appears normal for degree of bladder distention. Ascites is noted. IMPRESSION: Ascites.  Simple left renal cysts.  No other abnormality seen. Electronically Signed   By: Lupita RaiderJames  Green Jr, M.D.   On: 10/31/2016 09:37   Dg Abd Portable 1v  Result Date: 10/30/2016 CLINICAL DATA:  Nausea and vomiting. Cholelithiasis and hypertension. EXAM: PORTABLE ABDOMEN - 1 VIEW COMPARISON:  CT abdomen from 10/23/2016 FINDINGS: Moderate gaseous distention of the stomach possibly representing gastroparesis. Scattered air containing nonobstructed, nondistended large bowel. No significant small bowel dilatation. Calcification in the left upper quadrant is noted not clearly identified on the prior CT. It does not appear to represent nephrolithiasis. No definite calcified lymph node is seen on prior CT. Splenic vascular calcification might account for this. Mild degenerate change of the dorsal spine. IMPRESSION: Moderate gaseous distention of the stomach which may represent gastroparesis. No obstructive bowel  gas pattern. No definite free air. Electronically Signed   By: Tollie Eth M.D.   On: 10/30/2016 20:24    ASSESMENT:   *  FOBT + stool.  Diarrhea.  C diff negative. Both stools and emesis are "dark" but he takes BID Iron.   Colonoscopy in 04/2016: normal.  Not having diarrhea then.   CT scan last week showed pan colonic wall thickening, probably reflective of his hypoalbuminemia.  *  Normocytic anemia.  Had same in 04/2016.  On oral iron BID at  home. Hemoglobin down 1.8 grams in the last 24 hours.  Received 2 of 2 PRBCs today.  Repeat Hgb in AM.    *  Cirrhosis, decompensated with ascites.  Coagulopathy.  Grade 2 esophageal varices and portal gastropathy on EGD 04/2016, on Nadolol since then.   S/p 3.3 liter tap.   Not on diuretics.   *  Thrombocytopenia chronic, splenomegaly.    *  Osteomyelitis.  S/p left Hallux amputation 1 week ago.  S/p right AKA.  On Diflucan, IV doxycycline, Zosyn  *  Hypoalbuminemia, probable protein calorie malnutrition.    PLAN   *  EGD tomorrow.  Started Immodium tid.      [name]  10/31/2016, 3:07 PM Pager: [phone]

## 2016-11-01 NOTE — Interval H&P Note (Signed)
History and Physical Interval Note:  11/01/2016 9:59 AM  Jeffery FallenKenneth Bailon  has presented today for surgery, with the diagnosis of FOBT+ anemia  The various methods of treatment have been discussed with the patient and family. After consideration of risks, benefits and other options for treatment, the patient has consented to  Procedure(s): ESOPHAGOGASTRODUODENOSCOPY (EGD) WITH PROPOFOL (N/A) as a surgical intervention .  The patient's history has been reviewed, patient examined, no change in status, stable for surgery.  I have reviewed the patient's chart and labs.  Questions were answered to the patient's satisfaction.     [name]

## 2016-11-01 NOTE — Op Note (Addendum)
[facility] Patient Name: Jeffery [name] Procedure Date : 11/01/2016 MRN: [id number] Attending MD: Napoleon FormKavitha V. Ekam Besson , MD Date of Birth: 09/16/1951 CSN: [id number] Age: [age] Admit Type: Inpatient Procedure:                Upper GI endoscopy Indications:              Active gastrointestinal bleeding, Suspected upper                            gastrointestinal bleeding Providers:                Napoleon FormKavitha V. Maddie Brazier, MD, Will BonnetKatie Winchester RN, RN,                            Oletha Blendavida Shoffner, Technician Referring MD:              Medicines:                Monitored Anesthesia Care Complications:            No immediate complications. Estimated Blood Loss:     Estimated blood loss was minimal. Procedure:                Pre-Anesthesia Assessment:                           - Prior to the procedure, a History and Physical                            was performed, and patient medications and                            allergies were reviewed. The patient's tolerance of                            previous anesthesia was also reviewed. The risks                            and benefits of the procedure and the sedation                            options and risks were discussed with the patient.                            All questions were answered, and informed consent                            was obtained. Prior Anticoagulants: The patient                            last took aspirin 1 day prior to the procedure. ASA                            Grade Assessment: III - A patient with severe  systemic disease. After reviewing the risks and                            benefits, the patient was deemed in satisfactory                            condition to undergo the procedure.                           After obtaining informed consent, the endoscope was                            passed under direct vision. Throughout the                             procedure, the patient's blood pressure, pulse, and                            oxygen saturations were monitored continuously. The                            [id number] ([id number]) scope was introduced through the                            mouth, and advanced to the second part of duodenum.                            The upper GI endoscopy was accomplished without                            difficulty. The patient tolerated the procedure                            well. Scope In: Scope Out: Findings:      Many superficial esophageal ulcers with no bleeding and stigmata of       recent bleeding and findings of patchy mucosal necrosis were found 25 to       40 cm from the incisors. The largest lesion was 15 mm in largest       dimension.      Grade I varices were found in the lower third of the esophagus.      Severe portal hypertensive gastropathy was found in the entire examined       stomach with patchy erythema and adherent heme diffusely with punctate       erosions.      Many non-obstructing oozing with light touch, superficial duodenal       ulcers were found in the entire duodenum (bulb, 2nd and 3rd portion).       The largest lesion was 10 mm in largest dimension. Biopsies were taken       with a cold forceps for histology. Impression:               - Non-bleeding esophageal ulcers.                           - Grade I  esophageal varices.                           - Portal hypertensive gastropathy.                           - Multiple non-obstructing oozing duodenal ulcers                            with no stigmata of bleeding. Suspicious for NSAID                            induced etiology. Biopsied. Moderate Sedation:      N/A Recommendation:           - Patient has a contact number available for                            emergencies. The signs and symptoms of potential                            delayed complications were discussed with the                             patient. Return to normal activities tomorrow.                            Written discharge instructions were provided to the                            patient.                           - Clear liquid diet.                           - Continue present medications.                           - Protonix IV twice daily                           - Continue Fluconazole                           - Carafate suspension 1gm three times before meals                            and at bedtime                           - No ibuprofen, naproxen, or other non-steroidal                            anti-inflammatory drugs.                           - Await pathology results.                           -  Abdominal ultrasound with dopplers Procedure Code(s):        --- Professional ---                           [phone], Esophagogastroduodenoscopy, flexible,                            transoral; with biopsy, single or multiple Diagnosis Code(s):        --- Professional ---                           K22.10, Ulcer of esophagus without bleeding                           I85.00, Esophageal varices without bleeding                           K76.6, Portal hypertension                           K31.89, Other diseases of stomach and duodenum                           K26.4, Chronic or unspecified duodenal ulcer with                            hemorrhage                           K92.2, Gastrointestinal hemorrhage, unspecified CPT copyright 2016 [employer]. All rights reserved. The codes documented in this report are preliminary and upon coder review may  be revised to meet current compliance requirements. Napoleon Form, MD 11/01/2016 10:53:23 AM This report has been signed electronically. Number of Addenda: 0

## 2016-11-01 NOTE — Progress Notes (Signed)
Daily Progress Note   Patient Name: Jeffery [name]       Date: 11/01/2016 DOB: 06/03/1951  Age: 66 y.o. MRN#: [id number] Attending Physician: Rolly Salter, MD Primary Care Physician: Julian Hy, MD Admit Date: 10/23/2016  Reason for Consultation/Follow-up: Establishing goals of care  Subjective: Much more awake and alert today. Eating jello. S/p EGD- showed esophageal and duodenal ulcers, varices, portal hypertension, lesions biopsied. Denies pain or nausea. Very talkative- interesting conversation about the history of physics. 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 We again discussed his prognosis- he is aware it is not great. 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    ROS  Length of Stay: 9  Current Medications: Scheduled Meds:  . albumin human  25 g Intravenous Q6H  . ampicillin-sulbactam (UNASYN) IV  3 g Intravenous Q6H  . brimonidine  1 drop Both Eyes Daily  . doxycycline (VIBRAMYCIN) IV  100 mg Intravenous Q12H  . feeding supplement  1 Container Oral BID BM  . feeding supplement (PRO-STAT SUGAR FREE 64)  30 mL Oral TID  . fluconazole (DIFLUCAN) IV  100 mg Intravenous Q24H  . insulin aspart  0-9 Units Subcutaneous TID WC & HS  . loperamide  2 mg Oral TID  . metoCLOPramide (REGLAN) injection  5 mg Intravenous Q6H  . nadolol  20 mg Oral Daily  . nystatin   Topical BID  . pantoprazole (PROTONIX) IV  40 mg Intravenous Q12H  . PARoxetine  20 mg Oral Daily  . saccharomyces boulardii  250 mg Oral BID  . sucralfate  1 g Oral TID WC & HS    Continuous Infusions:   PRN Meds: acetaminophen **OR** acetaminophen,  LORazepam, methocarbamol, morphine injection, naLOXone (NARCAN)  injection, ondansetron, promethazine, traZODone  Physical Exam          Vital Signs: BP (!) 114/41 (BP Location: Right Arm)   Pulse 66   Temp 98 F (36.7 C) (Oral)   Resp 10   Ht 6\' 1"  (1.854 m)   Wt 109.1 kg (240 lb 8.4 oz)   SpO2 96%   BMI 31.73 kg/m  SpO2: SpO2: 96 % O2  Device: O2 Device: Not Delivered O2 Flow Rate: O2 Flow Rate (L/min): 2 L/min  Intake/output summary:  Intake/Output Summary (Last 24 hours) at 11/01/16 1525 Last data filed at 11/01/16 1452  Gross per 24 hour  Intake              920 ml  Output              260 ml  Net              660 ml   LBM: Last BM Date: 10/31/16 Baseline Weight: Weight: 104.4 kg (230 lb 1.6 oz) Most recent weight: Weight: 109.1 kg (240 lb 8.4 oz)       Palliative Assessment/Data: PPS: 20%   Flowsheet Rows   Flowsheet Row Most Recent Value  Intake Tab  Referral Department  Hospitalist  Unit at Time of Referral  Med/Surg Unit  Palliative Care Primary Diagnosis  Other (Comment) [cirrhosis]  Date Notified  10/29/16  Palliative Care Type  New Palliative care  Reason for referral  Advance Care Planning, Clarify Goals of Care  Date of Admission  10/23/16  Date first seen by Palliative Care  10/31/16  # of days IP prior to Palliative referral  6  Clinical Assessment  Palliative Performance Scale Score  20%  Psychosocial & Spiritual Assessment  Social Work Plan of Care  Clarified patient/family wishes with healthcare team, Advance care planning  Palliative Care Outcomes  Patient/Family meeting held?  Yes  Who was at the meeting?  Patient  Palliative Care Outcomes  Improved non-pain symptom therapy, Clarified goals of care, Changed CPR status      Patient Active Problem List   Diagnosis Date Noted  . Multiple duodenal ulcers   . Ulcer of esophagus without bleeding   . Melena   . Chronic liver failure without hepatic coma (HCC)   . Advance care planning   .  Goals of care, counseling/discussion   . Palliative care by specialist   . Moderate single current episode of major depressive disorder (HCC)   . Anxiety state   . Diaper candidiasis   . Somnolence   . Moderate malnutrition (HCC)   . Noninfectious gastroenteritis   . Gastrointestinal hemorrhage   . Controlled diabetes mellitus type 2 with complications (HCC)   . Osteomyelitis of left foot (HCC)   . Acute renal failure (HCC)   . Abdominal pain   . Diarrhea   . Osteomyelitis (HCC)   . C. difficile colitis 10/23/2016  . AKI (acute kidney injury) (HCC)   . Weakness   . Gastrointestinal hemorrhage with melena   . Idiopathic hypotension   . Other hyperlipidemia   . Esophageal varices without bleeding (HCC)   . Portal hypertensive gastropathy   . Cirrhosis of liver with ascites (HCC) 05/12/2016  . Special screening for malignant neoplasms, colon 05/12/2016  . Coagulopathy (HCC) 04/28/2016  . Hemolytic anemia (HCC) 03/24/2016  . G6PD deficiency anemia (HCC) 03/24/2016  . Thrombocytopenia (HCC) 03/24/2016  . Generalized psoriasis 03/20/2016  . Gilbert syndrome 03/20/2016  . Pancytopenia, acquired (HCC) 03/20/2016  . Hematemesis without nausea 03/20/2016    Palliative Care Assessment & Plan   Patient Profile: 66 y.o. male  with past medical history of cirrhosis, ascites, esophageal varices, portal hypertensive gastropathy, hemolytic anemia, thrombocytopenia, BKA for DM foot infection asthma, admitted on 10/23/2016 with abdominal pain and diarrhea. C-diff negative. Workup reveals end stage liver disease (PT 31, Albumin 1.5, refractory ascites), osteomyelitis of L big toe (s/t debridement  and amputation during this admission) and AKI. He continues to have N/V but declines medications.    Assessment/Recommendations/Plan   DNR  Continue current level of care  PMT will continue to follow and address GOC based on patient's trajectory  Patient is eligible for Hospice services based on  end stage liver disease algorithm:   PT >5 (33); INR > (1.5) AND Serum albumin <2.5gm (1.5) AND refractory ascites  Code Status:  DNR  Prognosis:   < 6 months in setting of end stage liver disease, now with AKI that does not appear to be improving  Discharge Planning:  To Be Determined  Care plan was discussed with patient.  Thank you for allowing the Palliative Medicine Team to assist in the care of this patient.   Total time: 25 minutes  Greater than 50%  of this time was spent counseling and coordinating care related to the above assessment and plan.  Ocie BobKasie Juelz Claar, AGNP-C Palliative Medicine   Please contact Palliative Medicine Team phone at [phone] for questions and concerns.

## 2016-11-01 NOTE — Progress Notes (Signed)
Occupational Therapy Treatment Patient Details Name: Jeffery [name] MRN: [id number] DOB: November 25, 1950 Today's Date: 11/01/2016    History of present illness Pt adm with diarrhea and weakness. Pt also with nonhealing ulcer of lt foot. Pt underwent left first ray amputation. PMH - DM, rt AKA,   OT comments  Pt making progress towards OT goals this session, less pain with bed mobility, and Pt sitting EOB for grooming and participant for LB bathing this session. Session limited by the fact that his [facility] shoe does not fit as a result of all the gauze on his LLE. Pt continues to benefit from skilled OT in the acute care setting and next session should focus on wc level transfers for toileting and functional ADL.   Follow Up Recommendations  SNF;Supervision/Assistance - 24 hour    Equipment Recommendations  Other (comment) (defer to next venue of care)    Recommendations for Other Services      Precautions / Restrictions Precautions Precautions: Fall Precaution Comments: previous Right AKA Required Braces or Orthoses: Other Brace/Splint (WBAT with DARCO shoe) Other Brace/Splint: Darco Shoe Restrictions Weight Bearing Restrictions: Yes LLE Weight Bearing: Weight bearing as tolerated Other Position/Activity Restrictions: with Darco shoe on       Mobility Bed Mobility Overal bed mobility: Needs Assistance Bed Mobility: Rolling;Sidelying to Sit;Sit to Sidelying Rolling: Min assist Sidelying to sit: Mod assist     Sit to sidelying: Min assist General bed mobility comments: Pt able to come to sitting from sidelying position with mod assist for trunk elevation and mod vc  Transfers                 General transfer comment: not attempted this session due to the fact that his LLE is wrapped in so much gauze that the [facility] shoe does not fit    Balance Overall balance assessment: Needs assistance Sitting-balance support: No upper extremity supported;Feet supported Sitting  balance-Leahy Scale: Good Sitting balance - Comments: sitting EOB with no back support for grooming tasks                           ADL Overall ADL's : Needs assistance/impaired     Grooming: Oral care;Set up;Sitting       Lower Body Bathing: Moderate assistance;Bed level;Sitting/lateral leans               Toileting- Clothing Manipulation and Hygiene: Maximal assistance;Bed level Toileting - Clothing Manipulation Details (indicate cue type and reason): bed level, Pt had BM and urinated; Pt with severe rash in peri area - barrier cream applied     Functional mobility during ADLs:  (not attempted this session, LLE is wrapped in too mch gauze)        Vision                     Perception     Praxis      Cognition   Behavior During Therapy: WFL for tasks assessed/performed Overall Cognitive Status: Within Functional Limits for tasks assessed                       Extremity/Trunk Assessment  Upper Extremity Assessment Upper Extremity Assessment: Overall WFL for tasks assessed   Lower Extremity Assessment Lower Extremity Assessment: RLE deficits/detail;LLE deficits/detail RLE Deficits / Details: AKA LLE Deficits / Details: Limited by pain and use of Darco shoe   Cervical / Trunk Assessment Cervical / Trunk Assessment:  Other exceptions Cervical / Trunk Exceptions: Constant complains of back pain    Exercises     Shoulder Instructions       General Comments      Pertinent Vitals/ Pain       Pain Assessment: Faces Faces Pain Scale: Hurts even more Pain Location: R hip with mobility Pain Descriptors / Indicators: Grimacing;Guarding;Moaning;Sore Pain Intervention(s): Limited activity within patient's tolerance;Monitored during session;Repositioned  Home Living Family/patient expects to be discharged to:: Private residence Living Arrangements: Alone Available Help at Discharge: Friend(s) Type of Home: House Home Access: Ramped  entrance     Home Layout: One level     Bathroom Shower/Tub: Producer, television/film/videoWalk-in shower   Bathroom Toilet: Handicapped height Bathroom Accessibility: Yes How Accessible: Accessible via wheelchair Home Equipment: Wheelchair - manual;Shower seat - built in   Additional Comments: works as Psychologist, 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 Independent at w/c level.   Frequency  Min 2X/week        Progress Toward Goals  OT Goals(current goals can now be found in the care plan section)  Progress towards OT goals: Progressing toward goals  Acute Rehab OT Goals Patient Stated Goal: get strength back OT Goal Formulation: With patient Time For Goal Achievement: 11/10/16 Potential to Achieve Goals: Good  Plan Discharge plan remains appropriate    Co-evaluation    PT/OT/SLP Co-Evaluation/Treatment: Yes Reason for Co-Treatment: To address functional/ADL transfers;For patient/therapist safety PT goals addressed during session: Mobility/safety with mobility OT goals addressed during session: ADL's and self-care      End of Session     Activity Tolerance Patient tolerated treatment well   Patient Left in bed;with call bell/phone within reach   Nurse Communication Mobility status;Other (comment) (IV beeping due to infusion completion)        Time: [id number] OT Time Calculation (min): 59 min  Charges: OT General Charges $OT Visit: 1 Procedure OT Treatments $Self Care/Home Management : 8-22 mins $Therapeutic Activity: 8-22 mins  Evern BioLaura J Raeli Wiens 11/01/2016, 4:42 PM  Sherryl MangesLaura Chirag Krueger OTR/L [phone]

## 2016-11-01 NOTE — Transfer of Care (Signed)
Immediate Anesthesia Transfer of Care Note  Patient: Jeffery [name]  Procedure(s) Performed: Procedure(s): ESOPHAGOGASTRODUODENOSCOPY (EGD) WITH PROPOFOL (N/A)  Patient Location: Endoscopy Unit  Anesthesia Type:MAC  Level of Consciousness: sedated  Airway & Oxygen Therapy: Patient Spontanous Breathing and Patient connected to nasal cannula oxygen  Post-op Assessment: Report given to RN and Post -op Vital signs reviewed and stable  Post vital signs: Reviewed and stable  Last Vitals:  Vitals:   11/01/16 0905 11/01/16 1046  BP: 126/80 (!) 108/54  Pulse: 69 70  Resp: 12 10  Temp: 36.7 C     Last Pain:  Vitals:   11/01/16 1046  TempSrc: Oral  PainSc:       Patients Stated Pain Goal: 2 (90/68/93 4068)  Complications: No apparent anesthesia complications

## 2016-11-01 NOTE — Anesthesia Preprocedure Evaluation (Signed)
Anesthesia Evaluation  Patient identified by MRN, date of birth, ID band Patient awake    Reviewed: Allergy & Precautions, NPO status , Patient's Chart, lab work & pertinent test results  Airway Mallampati: II  TM Distance: >3 FB Neck ROM: Full    Dental no notable dental hx. (+) Poor Dentition   Pulmonary neg pulmonary ROS, asthma ,    Pulmonary exam normal breath sounds clear to auscultation       Cardiovascular hypertension, Pt. on medications negative cardio ROS Normal cardiovascular exam Rhythm:Regular Rate:Normal     Neuro/Psych negative neurological ROS  negative psych ROS   GI/Hepatic (+) Cirrhosis   Esophageal Varices    ,   Endo/Other  diabetes, Type 2  Renal/GU Renal InsufficiencyRenal disease  negative genitourinary   Musculoskeletal negative musculoskeletal ROS (+)   Abdominal   Peds negative pediatric ROS (+)  Hematology  (+) anemia ,   Anesthesia Other Findings   Reproductive/Obstetrics negative OB ROS                             Anesthesia Physical Anesthesia Plan  ASA: III  Anesthesia Plan: MAC   Post-op Pain Management:    Induction: Intravenous  Airway Management Planned: Simple Face Mask  Additional Equipment:   Intra-op Plan:   Post-operative Plan:   Informed Consent: I have reviewed the patients History and Physical, chart, labs and discussed the procedure including the risks, benefits and alternatives for the proposed anesthesia with the patient or authorized representative who has indicated his/her understanding and acceptance.   Dental advisory given  Plan Discussed with: CRNA  Anesthesia Plan Comments:         Anesthesia Quick Evaluation

## 2016-11-01 NOTE — Progress Notes (Signed)
Physical Therapy Treatment Patient Details Name: Jeffery [name] MRN: [id number] DOB: 1951-04-14 Today's Date: 11/01/2016    History of Present Illness Pt adm with diarrhea and weakness. Pt also with nonhealing ulcer of lt foot. Pt underwent left first ray amputation. PMH - DM, rt AKA,    PT Comments    Patient was able to perform bed mobility and come to sitting EOB with less assist than previous session and with decreased pain. Unable to attempt stand/squat pivot OOB bed due to Darco shoe not fitting over bandaging and pt too fatigued to attempt AP transfer. Continue to progress as tolerated with anticipated d/c to SNF for further skilled PT services.    Follow Up Recommendations  SNF     Equipment Recommendations  None recommended by PT    Recommendations for Other Services       Precautions / Restrictions Precautions Precautions: Fall Precaution Comments: previous Right AKA Required Braces or Orthoses: Other Brace/Splint (WBAT with DARCO shoe) Other Brace/Splint: Darco Shoe Restrictions Weight Bearing Restrictions: Yes LLE Weight Bearing: Weight bearing as tolerated Other Position/Activity Restrictions: with Darco shoe on    Mobility  Bed Mobility Overal bed mobility: Needs Assistance Bed Mobility: Rolling;Sidelying to Sit;Sit to Sidelying Rolling: Min assist Sidelying to sit: Mod assist     Sit to sidelying: Min assist General bed mobility comments: pt rolled R and L several times for pericare with use of bed rails; Pt able to come to sitting from sidelying position with mod assist for trunk elevation and mod vc  Transfers                 General transfer comment: not attempted this session due to the fact that his LLE is wrapped in so much gauze that the [facility] shoe does not fit  Ambulation/Gait                 Stairs            Wheelchair Mobility    Modified Rankin (Stroke Patients Only)       Balance Overall balance assessment:  Needs assistance Sitting-balance support: No upper extremity supported;Feet supported Sitting balance-Leahy Scale: Good Sitting balance - Comments: sitting EOB with no back support for grooming tasks                            Cognition Arousal/Alertness: Awake/alert Behavior During Therapy: WFL for tasks assessed/performed Overall Cognitive Status: Within Functional Limits for tasks assessed                      Exercises      General Comments        Pertinent Vitals/Pain Pain Assessment: Faces Faces Pain Scale: Hurts even more Pain Location: R hip with mobility Pain Descriptors / Indicators: Grimacing;Guarding;Moaning;Sore Pain Intervention(s): Limited activity within patient's tolerance;Monitored during session;Repositioned    Home Living Family/patient expects to be discharged to:: Private residence Living Arrangements: Alone Available Help at Discharge: Friend(s) Type of Home: House Home Access: Ramped entrance   Home Layout: One level Home Equipment: Wheelchair - Manufacturing systems engineer - built in Additional Comments: works as Psychologist, prison and probation services at [employer]    Prior Function Level of Independence: Independent with assistive device(s)      Comments: Independent with transfers to w/c. Independent at w/c level.   PT Goals (current goals can now be found in the care plan section) Acute Rehab PT Goals Patient Stated Goal:  get strength back Progress towards PT goals: Progressing toward goals    Frequency    Min 3X/week      PT Plan Current plan remains appropriate    Co-evaluation PT/OT/SLP Co-Evaluation/Treatment: Yes Reason for Co-Treatment: For patient/therapist safety PT goals addressed during session: Mobility/safety with mobility OT goals addressed during session: ADL's and self-care     End of Session   Activity Tolerance: Patient tolerated treatment well Patient left: in bed;with call bell/phone within reach     Time:  [id number] PT Time Calculation (min) (ACUTE ONLY): 52 min  Charges:  $Therapeutic Activity: 8-22 mins                    G Codes:      Jeffery [name], PTA Pager: [phone]   11/01/2016, 4:57 PM

## 2016-11-01 NOTE — Progress Notes (Signed)
[facility] Progress Note  Patient: Jeffery [name] ZOX:[id number]   PCP: [name],[name], MD DOB: March 26, 1951   DOA: 10/23/2016   DOS: 11/01/2016   Date of Service: the patient was seen and examined on 11/01/2016   Subjective: nausea is better and no vomiting today. No abdominal pain as well. S/p EGD  Brief hospital course: Pt. with PMH of cirrhosis with ascites, portal hypertension with gastropathy and esophageal varices, hemolytic anemia, chronic thrombocytopenia, right AKA; admitted on 10/23/2016, with complaint of diarrhea, was found to have suspected viral gastroenteritis, GI bleed, left toe osteomyelitis and acute kidney injury. Initially admitted in the step down unit and now transferred to MedSurg floor since 10/30/2016. Currently further plan is continue supportive measures for GI bleed, monitor response to change antibiotics.  Assessment and Plan: 1. Persistent diarrhea, seems to have improved. C. difficile negative. FOBT positive. GI pathogen panel negative CT scan shows pancolitis versus possible hypoalbuminemia causing wall thickening. Requirement of 14 day treatment course for antibiotics is not helping his diarrhea as well. GI has been consulted. On probiotic, scheduled imodium. monitor response.  2. 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 Pathology report shows clean margins. Discuss with infectious disease, recommended to continue 14 days of antibiotics. Patient was on doxycycline and IV Zosyn. Cultures so far negative. change to IV doxycycline and IV Unasyn while in the hospital. Probiotics added. Last day of antibiotic 11/07/2016. Wound vac has been removed.   3. Cirrhosis of the liver. Portal hypertensive gastropathy, esophageal varices. Duodenal ulcers hematemesis as well as melena. Acute blood loss anemia  GI consulted, appreciate input. Transfuse for hemoglobin less than 7. Nadolol dose has been reduced from 40 mg with 20 mg due to  hypotension and acute kidney injury. Hypoalbuminemia, low fibrinogen level, elevated INR and chronic thrombocytopenia all secondary to cirrhosis. S/P paracentesis 10/24/2016 with 3 L of fluid removed. No evidence of SBP. GI considering Aldactone and Lasix, would not recommend its use in the presence of acute kidney injury Hold IV fluids. On clear liquid diet S/P EGD; on IV protonix, IV diflucan, getting US abdomen with doppler, gastroenterology Recommends to watch INR for now. Hopefully will improve with oral intake.   4. chronic thrombocytopenia Platelet has been low since last one year. Likely in the setting of liver disease. Elevated INR and low fibrinogen due to liver disease as well. I highly doubt presence of DIC TTP at present. 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 Peripheral smear shows leukocytes without any schistocytes. Monitor for now. Stop aspirin and use mechanical prophylaxis for DVT.  5. Acute kidney injury. Seems to have stabilized after blood transfusion. Will monitor with daily weight and strict IN and OUT. Baseline renal function 1.3, on presentation 2.49, worsening again, probably multifactorial Due to hypotension and dehydration from diarrhea. Unclear whether surgery as well as paracentesis has any role to play in persistence of renal dysfunction. US renal negative Reduce the dose of nadolol. Getting IV albumin.  6. Type 2 diabetes mellitus. The patient remains on clear liquid diet and will continue every 4 hours CBG with sliding scale. Monitor. A1c 5.8 on admission.  Bowel regimen: last BM 10/31/2016 Diet: clear liquid diet DVT Prophylaxis: mechanical compression device  Advance goals of care discussion: I discussed with patient, his prognosis is significantly guarded in the setting of GI bleed, acute kidney injury, wound infection. Persistent nausea vomiting and diarrhea is also making things worse. Pt is also malnourished, which also  render's poor prognosis. Palliative care  also discussed with the patient, code status has been changed from full code to DNR/DNI. We'll monitor.  Family Communication: no family was present at bedside, at the time of interview.  Disposition:  Discharge to SNF. Expected discharge date: 11/03/2016, resolution GI symptoms and stabilization of H&H, renal function  Consultants: Gastroenterology, orthopedics, palliative care Procedures: First ray amputation of the left leg.  Antibiotics: Anti-infectives    Start     Dose/Rate Route Frequency Ordered Stop   11/01/16 1800  fluconazole (DIFLUCAN) IVPB 100 mg     100 mg 50 mL/hr over 60 Minutes Intravenous Every 24 hours 11/01/16 1142 11/08/16 1759   10/31/16 1800  doxycycline (VIBRAMYCIN) 100 mg in dextrose 5 % 250 mL IVPB     100 mg 125 mL/hr over 120 Minutes Intravenous Every 12 hours 10/31/16 1710     10/31/16 1800  fluconazole (DIFLUCAN) IVPB 100 mg  Status:  Discontinued     100 mg 50 mL/hr over 60 Minutes Intravenous Every 24 hours 10/31/16 1710 11/01/16 1142   10/31/16 1000  fluconazole (DIFLUCAN) tablet 100 mg  Status:  Discontinued     100 mg Oral Daily 10/30/16 1239 10/31/16 1709   10/30/16 2200  doxycycline (VIBRA-TABS) tablet 100 mg  Status:  Discontinued     100 mg Oral Every 12 hours 10/30/16 1238 10/31/16 1709   10/30/16 1400  Ampicillin-Sulbactam (UNASYN) 3 g in sodium chloride 0.9 % 100 mL IVPB     3 g 200 mL/hr over 30 Minutes Intravenous Every 6 hours 10/30/16 1333 11/06/16 1359   10/30/16 0800  fluconazole (DIFLUCAN) IVPB 100 mg  Status:  Discontinued     100 mg 50 mL/hr over 60 Minutes Intravenous Every 24 hours 10/29/16 0826 10/30/16 1238   10/29/16 0800  fluconazole (DIFLUCAN) IVPB 200 mg     200 mg 100 mL/hr over 60 Minutes Intravenous  Once 10/29/16 0739 10/29/16 0914   10/24/16 0600  piperacillin-tazobactam (ZOSYN) IVPB 3.375 g  Status:  Discontinued    Comments:  Zosyn 3.375 g IV q8h for CrCl > 10 mL/min    3.375 g 12.5 mL/hr over 240 Minutes Intravenous Every 8 hours 10/24/16 0130 10/30/16 1231   10/23/16 2330  doxycycline (VIBRAMYCIN) 100 mg in dextrose 5 % 250 mL IVPB  Status:  Discontinued     100 mg 125 mL/hr over 120 Minutes Intravenous Every 12 hours 10/23/16 2322 10/30/16 1231   10/23/16 2330  piperacillin-tazobactam (ZOSYN) IVPB 3.375 g  Status:  Discontinued     3.375 g 12.5 mL/hr over 240 Minutes Intravenous Every 8 hours 10/23/16 2323 10/24/16 0130      Objective: Physical Exam: Vitals:   11/01/16 1046 11/01/16 1050 11/01/16 1100 11/01/16 1451  BP: (!) 108/54 (!) 115/59 (!) 122/50 (!) 114/41  Pulse: 70 68 66 66  Resp: 10 14 10    Temp: 98.9 F (37.2 C)   98 F (36.7 C)  TempSrc: Oral   Oral  SpO2: 100% 100% 100% 96%  Weight:      Height:        Intake/Output Summary (Last 24 hours) at 11/01/16 1548 Last data filed at 11/01/16 1452  Gross per 24 hour  Intake              920 ml  Output              260 ml  Net              660 ml   Filed  Weights   10/31/16 0419 11/01/16 0624 11/01/16 0905  Weight: 113.8 kg (250 lb 12.8 oz) 109.1 kg (240 lb 8.4 oz) 109.1 kg (240 lb 8.4 oz)    General: Alert, Awake and Oriented to Time, Place and Person. Appear in mild distress, affect appropriate Eyes: PERRL, Conjunctiva normal ENT: Oral Mucosa clear moist. Neck: difficult to assess JVD, no Abnormal Mass Or lumps Cardiovascular: S1 and S2 Present, no Murmur, Respiratory: Bilateral Air entry equal and Decreased, no use of accessory muscle, Clear to Auscultation, no Crackles, no wheezes Abdomen: Bowel Sound present, Soft and mild tenderness Skin: no redness, no Rash, no induration Extremities: bilateral Pedal edema, no calf tenderness Neurologic: Grossly no focal neuro deficit. Bilaterally Equal motor strength  Data Reviewed: CBC:  Recent Labs Lab 10/27/16 0948 10/29/16 0226 10/30/16 0315 10/30/16 0708 10/31/16 0755 11/01/16 0402  WBC 6.4 5.2 7.6  --  7.3 9.3    NEUTROABS  --   --   --  6.2  --   --   HGB 9.8* 7.8* 7.8*  --  6.0* 7.1*  HCT 29.0* 22.3* 22.6*  --  17.3* 20.7*  MCV 100.0 97.8 97.8  --  98.9 93.7  PLT 92* 71* 87*  --  73* 67*   Basic Metabolic Panel:  Recent Labs Lab 10/27/16 0948 10/29/16 0226 10/30/16 0315 10/31/16 0341 11/01/16 0402  NA 134* 134* 133* 135 136  K 4.3 4.3 4.6 4.5 4.5  CL 108 107 107 107 107  CO2 18* 21* 19* 18* 18*  GLUCOSE 131* 123* 139* 118* 150*  BUN 24* 22* 26* 35* 45*  CREATININE 1.81* 1.81* 1.91* 2.50* 2.45*  CALCIUM 8.0* 7.9* 8.1* 8.4* 8.6*  MG 1.7 1.6* 2.0 2.0  --   PHOS  --   --   --   --  3.7    Liver Function Tests:  Recent Labs Lab 10/26/16 0252 11/01/16 0402  AST 61*  --   ALT 26  --   ALKPHOS 52  --   BILITOT 1.4*  --   PROT 6.0*  --   ALBUMIN 1.5* 2.5*   No results for input(s): LIPASE, AMYLASE in the last 168 hours.  Recent Labs Lab 10/29/16 0841  AMMONIA 13   Coagulation Profile:  Recent Labs Lab 10/30/16 0818 10/31/16 0341 11/01/16 0402  INR 2.50 2.90 3.15   Cardiac Enzymes: No results for input(s): CKTOTAL, CKMB, CKMBINDEX, TROPONINI in the last 168 hours. BNP (last 3 results) No results for input(s): PROBNP in the last 8760 hours.  CBG:  Recent Labs Lab 10/31/16 1136 10/31/16 1621 10/31/16 2306 11/01/16 0336 11/01/16 1230  GLUCAP 119* 134* 153* 145* 165*    Studies: No results found.   Scheduled Meds: . albumin human  25 g Intravenous Q6H  . ampicillin-sulbactam (UNASYN) IV  3 g Intravenous Q6H  . brimonidine  1 drop Both Eyes Daily  . doxycycline (VIBRAMYCIN) IV  100 mg Intravenous Q12H  . feeding supplement  1 Container Oral BID BM  . feeding supplement (PRO-STAT SUGAR FREE 64)  30 mL Oral TID  . fluconazole (DIFLUCAN) IV  100 mg Intravenous Q24H  . insulin aspart  0-9 Units Subcutaneous TID WC & HS  . loperamide  2 mg Oral TID  . metoCLOPramide (REGLAN) injection  5 mg Intravenous Q6H  . nadolol  20 mg Oral Daily  . nystatin    Topical BID  . pantoprazole (PROTONIX) IV  40 mg Intravenous Q12H  . PARoxetine  20 mg  Oral Daily  . saccharomyces boulardii  250 mg Oral BID  . sucralfate  1 g Oral TID WC & HS   Continuous Infusions: PRN Meds: acetaminophen **OR** acetaminophen, LORazepam, methocarbamol, morphine injection, naLOXone (NARCAN)  injection, ondansetron, promethazine, traZODone  Time spent: 30 minutes  Author: Lynden Oxford, MD [facility] Pager: [phone] 11/01/2016 3:48 PM  If 7PM-7AM, please contact night-coverage at [email or web address], password [facility]

## 2016-11-01 NOTE — Progress Notes (Signed)
Patient O2 stats dropped to low 80's when he would go to sleep. Patient states that he does have that problem but no sleep study has been done. Place patient on 2L O2 stats returned to 100%. Ilean SkillVeronica Camdin Hegner LPN

## 2016-11-01 NOTE — Progress Notes (Signed)
Subjective: No problems with left foot.   Objective: Vital signs in last 24 hours: Temp:  [97.8 F (36.6 C)-98.9 F (37.2 C)] 98.9 F (37.2 C) (02/14 1046) Pulse Rate:  [62-71] 66 (02/14 1100) Resp:  [10-18] 10 (02/14 1100) BP: (98-139)/(40-80) 122/50 (02/14 1100) SpO2:  [96 %-100 %] 100 % (02/14 1100) Weight:  [240 lb 8.4 oz (109.1 kg)] 240 lb 8.4 oz (109.1 kg) (02/14 0905)  Intake/Output from previous day: 02/13 0701 - 02/14 0700 In: 600 [P.O.:50; IV Piggyback:550] Out: 260 [Urine:260] Intake/Output this shift: Total I/O In: 200 [I.V.:200] Out: 0    Recent Labs  10/30/16 0315 10/31/16 0755 11/01/16 0402  HGB 7.8* 6.0* 7.1*    Recent Labs  10/31/16 0755 11/01/16 0402  WBC 7.3 9.3  RBC 1.75* 2.21*  HCT 17.3* 20.7*  PLT 73* 67*    Recent Labs  10/31/16 0341 11/01/16 0402  NA 135 136  K 4.5 4.5  CL 107 107  CO2 18* 18*  BUN 35* 45*  CREATININE 2.50* 2.45*  GLUCOSE 118* 150*  CALCIUM 8.4* 8.6*    Recent Labs  10/31/16 0341 11/01/16 0402  INR 2.90 3.15    Some bleeding through dressing that was applied yesterday and this was reinforced  Assessment/Plan: Continue present care.   Jeffery [name] 11/01/2016, 1:15 PM

## 2016-11-02 ENCOUNTER — Inpatient Hospital Stay (HOSPITAL_COMMUNITY): Payer: BC Managed Care – PPO

## 2016-11-02 DIAGNOSIS — K264 Chronic or unspecified duodenal ulcer with hemorrhage: Secondary | ICD-10-CM

## 2016-11-02 LAB — GLUCOSE, CAPILLARY
GLUCOSE-CAPILLARY: 150 mg/dL — AB (ref 65–99)
Glucose-Capillary: 133 mg/dL — ABNORMAL HIGH (ref 65–99)
Glucose-Capillary: 135 mg/dL — ABNORMAL HIGH (ref 65–99)
Glucose-Capillary: 134 mg/dL — ABNORMAL HIGH (ref 65–99)

## 2016-11-02 LAB — STOOL CULTURE REFLEX - CMPCXR

## 2016-11-02 LAB — TYPE AND SCREEN
ABO/RH(D): A POS
Antibody Screen: NEGATIVE
Unit division: 0
Unit division: 0
Unit division: 0

## 2016-11-02 LAB — PROTIME-INR
INR: 3.41
INR: 3.11
PROTHROMBIN TIME: 35.3 s — AB (ref 11.4–15.2)
Prothrombin Time: 32.7 seconds — ABNORMAL HIGH (ref 11.4–15.2)

## 2016-11-02 LAB — CBC
HEMATOCRIT: 21.2 % — AB (ref 39.0–52.0)
HEMATOCRIT: 20.9 % — AB (ref 39.0–52.0)
Hemoglobin: 7.3 g/dL — ABNORMAL LOW (ref 13.0–17.0)
Hemoglobin: 7.2 g/dL — ABNORMAL LOW (ref 13.0–17.0)
MCH: 32 pg (ref 26.0–34.0)
MCH: 32.3 pg (ref 26.0–34.0)
MCHC: 34.4 g/dL (ref 30.0–36.0)
MCHC: 34.4 g/dL (ref 30.0–36.0)
MCV: 93 fL (ref 78.0–100.0)
MCV: 93.7 fL (ref 78.0–100.0)
Platelets: 52 10*3/uL — ABNORMAL LOW (ref 150–400)
Platelets: 52 10*3/uL — ABNORMAL LOW (ref 150–400)
RBC: 2.28 MIL/uL — ABNORMAL LOW (ref 4.22–5.81)
RBC: 2.23 MIL/uL — AB (ref 4.22–5.81)
RDW: 16.9 % — AB (ref 11.5–15.5)
RDW: 17.1 % — ABNORMAL HIGH (ref 11.5–15.5)
WBC: 6.6 10*3/uL (ref 4.0–10.5)
WBC: 6 10*3/uL (ref 4.0–10.5)

## 2016-11-02 LAB — STOOL CULTURE: E COLI SHIGA TOXIN ASSAY: NEGATIVE

## 2016-11-02 LAB — RENAL FUNCTION PANEL
Albumin: 2.2 g/dL — ABNORMAL LOW (ref 3.5–5.0)
Anion gap: 4 — ABNORMAL LOW (ref 5–15)
BUN: 52 mg/dL — AB (ref 6–20)
CHLORIDE: 114 mmol/L — AB (ref 101–111)
CO2: 19 mmol/L — AB (ref 22–32)
Calcium: 8.7 mg/dL — ABNORMAL LOW (ref 8.9–10.3)
Creatinine, Ser: 2.29 mg/dL — ABNORMAL HIGH (ref 0.61–1.24)
GFR calc Af Amer: 33 mL/min — ABNORMAL LOW (ref >60)
GFR, EST NON AFRICAN AMERICAN: 28 mL/min — AB (ref >60)
Glucose, Bld: 144 mg/dL — ABNORMAL HIGH (ref 65–99)
POTASSIUM: 4 mmol/L (ref 3.5–5.1)
Phosphorus: 3.2 mg/dL (ref 2.5–4.6)
Sodium: 137 mmol/L (ref 135–145)

## 2016-11-02 LAB — STOOL CULTURE REFLEX - RSASHR

## 2016-11-02 LAB — UREA NITROGEN, URINE: Urea Nitrogen, Ur: 530 mg/dL

## 2016-11-02 MED ORDER — PHYTONADIONE 5 MG PO TABS
10.0000 mg | ORAL_TABLET | Freq: Every day | ORAL | Status: AC
  Administered 2016-11-02 – 2016-11-04 (×3): 10 mg via ORAL
  Filled 2016-11-02 (×3): qty 2

## 2016-11-02 MED ORDER — GLUCERNA SHAKE PO LIQD
237.0000 mL | Freq: Three times a day (TID) | ORAL | Status: DC
  Administered 2016-11-03 – 2016-11-08 (×9): 237 mL via ORAL

## 2016-11-02 MED ORDER — PANTOPRAZOLE SODIUM 40 MG PO TBEC
40.0000 mg | DELAYED_RELEASE_TABLET | Freq: Two times a day (BID) | ORAL | Status: DC
  Administered 2016-11-02 – 2016-11-08 (×11): 40 mg via ORAL
  Filled 2016-11-02 (×12): qty 1

## 2016-11-02 NOTE — Progress Notes (Signed)
[facility] Progress Note  Patient: Jeffery [name]:[id number]   PCP: [name],[name], MD DOB: [birth year]   DOA: 10/23/2016   DOS: 11/02/2016   Date of Service: the patient was seen and examined on 11/02/2016   Subjective:No acute complaints. No nausea no vomiting. Continues to have diarrhea.  Brief hospital course: Pt. with PMH of cirrhosis with ascites, portal hypertension with gastropathy and esophageal varices, hemolytic anemia, chronic thrombocytopenia, right AKA; admitted on 10/23/2016, with complaint of diarrhea, was found to have suspected viral gastroenteritis, GI bleed, left toe osteomyelitis and acute kidney injury. Initially admitted in the step down unit and now transferred to MedSurg floor since 10/30/2016. Currently further plan is continue supportive measures for GI bleed, monitor response to change antibiotics.  Assessment and Plan: 1. Persistent diarrhea, seems to have improved. C. difficile negative. FOBT positive. GI pathogen panel negative CT scan shows pancolitis versus possible hypoalbuminemia causing wall thickening. Requirement of 14 day treatment course for antibiotics is not helping his diarrhea as well. GI has been consulted. On probiotic, scheduled imodium. monitor response.  2. 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 Pathology report shows clean margins. Discuss with infectious disease, recommended to continue 14 days of antibiotics. Patient was on doxycycline and IV Zosyn. Cultures so far negative. change to IV doxycycline and IV Unasyn while in the hospital. Probiotics added. Last day of antibiotic 11/07/2016. Wound vac has been removed.   3. Cirrhosis of the liver. Portal hypertensive gastropathy, esophageal varices. Duodenal ulcers hematemesis as well as melena. Acute blood loss anemia  GI consulted, appreciate input. Transfuse for hemoglobin less than 7. Nadolol dose has been reduced from 40 mg with 20 mg due to hypotension  and acute kidney injury. Hypoalbuminemia, low fibrinogen level, elevated INR and chronic thrombocytopenia all secondary to cirrhosis. S/P paracentesis 10/24/2016 with 3 L of fluid removed. No evidence of SBP. GI considering Aldactone and Lasix, would not recommend its use in the presence of acute kidney injury Advancing to carb modified diet S/P EGD; on protonix, IV diflucan, pending US abdomen with doppler, gastroenterology initiate her on vitamin K. Will monitor. Recheck CBC 6hours after vitamin K  4. chronic thrombocytopenia Platelet has been low since last one year. Likely in the setting of liver disease. Elevated INR and low fibrinogen due to liver disease as well. I highly doubt presence of DIC TTP at present. 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 Peripheral smear shows leukocytes without any schistocytes. If The patient requires PRBC transfusion he would also require a platelet transfusion to avoid dilution of his platelets.  Stop aspirin and use mechanical prophylaxis for DVT.  5. Acute kidney injury. Seems to improve after blood transfusion. Will monitor with daily weight and strict IN and OUT. Baseline renal function 1.3, on presentation 2.49, now stable, probably multifactorial Due to hypotension and dehydration from diarrhea. Unclear whether surgery as well as paracentesis has any role to play in persistence of renal dysfunction. US renal negative Reduce the dose of nadolol. Given IV albumin.  6. Type 2 diabetes mellitus. The patient remains on clear liquid diet and will continue every 4 hours CBG with sliding scale. Monitor. A1c 5.8 on admission.  Bowel regimen: last BM 11/02/2016 Diet: Carb modified DVT Prophylaxis: mechanical compression device  Advance goals of care discussion: I discussed with patient, his prognosis is significantly guarded in the setting of GI bleed, acute kidney injury, wound infection. Persistent nausea vomiting and diarrhea  is also making things worse. Pt is also  malnourished, which also render's poor prognosis. Palliative care also discussed with the patient, code status has been changed from full code to DNR/DNI.  Patient asked me today that if my prognosis is limited is there meaning in continuing what we're doing? I recommended the rehabilitation opportunity to get him out of the hospital this admission but recommend him to consider  transition to hospice at the time of the discharge. We'll monitor.  Family Communication: no family was present at bedside, at the time of interview.   Disposition:  Discharge to SNF. Expected discharge date: 11/05/2016, resolution GI symptoms and stabilization of H&H, renal function  Consultants: Gastroenterology, orthopedics, palliative care Procedures: First ray amputation of the left leg.  Antibiotics: Anti-infectives    Start     Dose/Rate Route Frequency Ordered Stop   11/01/16 1800  fluconazole (DIFLUCAN) IVPB 100 mg     100 mg 50 mL/hr over 60 Minutes Intravenous Every 24 hours 11/01/16 1142 11/08/16 1759   10/31/16 1800  doxycycline (VIBRAMYCIN) 100 mg in dextrose 5 % 250 mL IVPB     100 mg 125 mL/hr over 120 Minutes Intravenous Every 12 hours 10/31/16 1710     10/31/16 1800  fluconazole (DIFLUCAN) IVPB 100 mg  Status:  Discontinued     100 mg 50 mL/hr over 60 Minutes Intravenous Every 24 hours 10/31/16 1710 11/01/16 1142   10/31/16 1000  fluconazole (DIFLUCAN) tablet 100 mg  Status:  Discontinued     100 mg Oral Daily 10/30/16 1239 10/31/16 1709   10/30/16 2200  doxycycline (VIBRA-TABS) tablet 100 mg  Status:  Discontinued     100 mg Oral Every 12 hours 10/30/16 1238 10/31/16 1709   10/30/16 1400  Ampicillin-Sulbactam (UNASYN) 3 g in sodium chloride 0.9 % 100 mL IVPB     3 g 200 mL/hr over 30 Minutes Intravenous Every 6 hours 10/30/16 1333 11/06/16 1359   10/30/16 0800  fluconazole (DIFLUCAN) IVPB 100 mg  Status:  Discontinued     100 mg 50 mL/hr over 60  Minutes Intravenous Every 24 hours 10/29/16 0826 10/30/16 1238   10/29/16 0800  fluconazole (DIFLUCAN) IVPB 200 mg     200 mg 100 mL/hr over 60 Minutes Intravenous  Once 10/29/16 0739 10/29/16 0914   10/24/16 0600  piperacillin-tazobactam (ZOSYN) IVPB 3.375 g  Status:  Discontinued    Comments:  Zosyn 3.375 g IV q8h for CrCl > 10 mL/min   3.375 g 12.5 mL/hr over 240 Minutes Intravenous Every 8 hours 10/24/16 0130 10/30/16 1231   10/23/16 2330  doxycycline (VIBRAMYCIN) 100 mg in dextrose 5 % 250 mL IVPB  Status:  Discontinued     100 mg 125 mL/hr over 120 Minutes Intravenous Every 12 hours 10/23/16 2322 10/30/16 1231   10/23/16 2330  piperacillin-tazobactam (ZOSYN) IVPB 3.375 g  Status:  Discontinued     3.375 g 12.5 mL/hr over 240 Minutes Intravenous Every 8 hours 10/23/16 2323 10/24/16 0130      Objective: Physical Exam: Vitals:   11/01/16 2015 11/02/16 0522 11/02/16 0800 11/02/16 1157  BP: (!) 107/48 (!) 124/56 (!) 114/51 (!) 111/54  Pulse: 67 60 64 (!) 57  Resp: 16 16 16 14   Temp: 97.8 F (36.6 C) 98.1 F (36.7 C) 97.5 F (36.4 C) 97.6 F (36.4 C)  TempSrc: Oral Oral Oral Oral  SpO2: 98% 100% 100% 100%  Weight:   108.9 kg (240 lb 1.3 oz)   Height:        Intake/Output Summary (Last 24  hours) at 11/02/16 1806 Last data filed at 11/02/16 1700  Gross per 24 hour  Intake              870 ml  Output              575 ml  Net              295 ml   Filed Weights   11/01/16 0624 11/01/16 0905 11/02/16 0800  Weight: 109.1 kg (240 lb 8.4 oz) 109.1 kg (240 lb 8.4 oz) 108.9 kg (240 lb 1.3 oz)    General: Alert, Awake and Oriented to Time, Place and Person. Appear in mild distress, affect appropriate Eyes: PERRL, Conjunctiva normal ENT: Oral Mucosa clear moist. Neck: difficult to assess JVD, no Abnormal Mass Or lumps Cardiovascular: S1 and S2 Present, no Murmur, Respiratory: Bilateral Air entry equal and Decreased, no use of accessory muscle, Clear to Auscultation, no  Crackles, no wheezes Abdomen: Bowel Sound present, Soft and mild tenderness Skin: left buttock bruise from prior fall, no redness, no Rash, no induration Extremities: bilateral Pedal edema, no calf tenderness Neurologic: Grossly no focal neuro deficit. Bilaterally Equal motor strength  Data Reviewed: CBC:  Recent Labs Lab 10/29/16 0226 10/30/16 0315 10/30/16 0708 10/31/16 0755 11/01/16 0402 11/02/16 0303  WBC 5.2 7.6  --  7.3 9.3 6.6  NEUTROABS  --   --  6.2  --   --   --   HGB 7.8* 7.8*  --  6.0* 7.1* 7.3*  HCT 22.3* 22.6*  --  17.3* 20.7* 21.2*  MCV 97.8 97.8  --  98.9 93.7 93.0  PLT 71* 87*  --  73* 67* 52*   Basic Metabolic Panel:  Recent Labs Lab 10/27/16 0948 10/29/16 0226 10/30/16 0315 10/31/16 0341 11/01/16 0402 11/02/16 0303  NA 134* 134* 133* 135 136 137  K 4.3 4.3 4.6 4.5 4.5 4.0  CL 108 107 107 107 107 114*  CO2 18* 21* 19* 18* 18* 19*  GLUCOSE 131* 123* 139* 118* 150* 144*  BUN 24* 22* 26* 35* 45* 52*  CREATININE 1.81* 1.81* 1.91* 2.50* 2.45* 2.29*  CALCIUM 8.0* 7.9* 8.1* 8.4* 8.6* 8.7*  MG 1.7 1.6* 2.0 2.0  --   --   PHOS  --   --   --   --  3.7 3.2    Liver Function Tests:  Recent Labs Lab 11/01/16 0402 11/02/16 0303  ALBUMIN 2.5* 2.2*   No results for input(s): LIPASE, AMYLASE in the last 168 hours.  Recent Labs Lab 10/29/16 0841  AMMONIA 13   Coagulation Profile:  Recent Labs Lab 10/30/16 0818 10/31/16 0341 11/01/16 0402 11/02/16 0303  INR 2.50 2.90 3.15 3.41   Cardiac Enzymes: No results for input(s): CKTOTAL, CKMB, CKMBINDEX, TROPONINI in the last 168 hours. BNP (last 3 results) No results for input(s): PROBNP in the last 8760 hours.  CBG:  Recent Labs Lab 11/01/16 1632 11/01/16 2021 11/02/16 0658 11/02/16 1204 11/02/16 1642  GLUCAP 174* 156* 133* 135* 150*    Studies: No results found.   Scheduled Meds: . ampicillin-sulbactam (UNASYN) IV  3 g Intravenous Q6H  . brimonidine  1 drop Both Eyes Daily  .  doxycycline (VIBRAMYCIN) IV  100 mg Intravenous Q12H  . feeding supplement (GLUCERNA SHAKE)  237 mL Oral TID BM  . feeding supplement (PRO-STAT SUGAR FREE 64)  30 mL Oral TID  . fluconazole (DIFLUCAN) IV  100 mg Intravenous Q24H  . insulin aspart  0-9 Units  Subcutaneous TID WC & HS  . loperamide  2 mg Oral TID  . metoCLOPramide (REGLAN) injection  5 mg Intravenous Q6H  . nadolol  20 mg Oral Daily  . nystatin   Topical BID  . pantoprazole  40 mg Oral BID  . PARoxetine  20 mg Oral Daily  . phytonadione  10 mg Oral Daily  . saccharomyces boulardii  250 mg Oral BID  . sucralfate  1 g Oral TID WC & HS   Continuous Infusions: PRN Meds: acetaminophen **OR** acetaminophen, LORazepam, methocarbamol, morphine injection, naLOXone (NARCAN)  injection, ondansetron, promethazine, traZODone  Time spent: 30 minutes  Author: Lynden Oxford, MD [facility] Pager: [phone] 11/02/2016 6:06 PM  If 7PM-7AM, please contact night-coverage at [email or web address], password [facility]

## 2016-11-02 NOTE — Progress Notes (Signed)
Subjective: Patient states that he is doing well. No complaints with left foot.   Objective: Vital signs in last 24 hours: Temp:  [97.5 F (36.4 C)-98.1 F (36.7 C)] 97.6 F (36.4 C) (02/15 1157) Pulse Rate:  [57-67] 57 (02/15 1157) Resp:  [14-16] 14 (02/15 1157) BP: (107-124)/(48-56) 111/54 (02/15 1157) SpO2:  [98 %-100 %] 100 % (02/15 1157) Weight:  [240 lb 1.3 oz (108.9 kg)] 240 lb 1.3 oz (108.9 kg) (02/15 0800)  Intake/Output from previous day: 02/14 0701 - 02/15 0700 In: 1290 [P.O.:120; I.V.:320; Blood:300; IV Piggyback:550] Out: 0  Intake/Output this shift: Total I/O In: 200 [IV Piggyback:200] Out: 575 [Urine:575]   Recent Labs  10/31/16 0755 11/01/16 0402 11/02/16 0303  HGB 6.0* 7.1* 7.3*    Recent Labs  11/01/16 0402 11/02/16 0303  WBC 9.3 6.6  RBC 2.21* 2.28*  HCT 20.7* 21.2*  PLT 67* 52*    Recent Labs  11/01/16 0402 11/02/16 0303  NA 136 137  K 4.5 4.0  CL 107 114*  CO2 18* 19*  BUN 45* 52*  CREATININE 2.45* 2.29*  GLUCOSE 150* 144*  CALCIUM 8.6* 8.7*    Recent Labs  11/01/16 0402 11/02/16 0303  INR 3.15 3.41    Exam: Very pleasant white male alert and oriented in no acute distress. Left foot he does have some bleeding through his dressing. No gross drainage. Wound looks good. Sutures are intact. Does have some swelling although not extreme. No gross signs of infection.  Assessment/Plan: Patient currently stable from orthopedic standpoint. Nursing can continue daily dressing changes. We will follow-up the patient Monday morning and Dr. Ophelia CharterYates will be back next week.   Jeffery [name] 11/02/2016, 6:07 PM

## 2016-11-02 NOTE — Progress Notes (Signed)
Nutrition Follow-up  DOCUMENTATION CODES:   Obesity unspecified  INTERVENTION:  Discontinue Boost Breeze.  Provide Glucerna Shake po TID, each supplement provides 220 kcal and 10 grams of protein.  Continue 30 ml Prostat po TID, each supplement provides 100 kcal and 15 grams of protein.   Encourage adequate PO intake.   NUTRITION DIAGNOSIS:   Increased nutrient needs related to chronic illness as evidenced by estimated needs; ongoing  GOAL:   Patient will meet greater than or equal to 90% of their needs; progressing  MONITOR:   PO intake, Supplement acceptance, Diet advancement, Labs, Weight trends, Skin, I & O's  REASON FOR ASSESSMENT:   Consult Assessment of nutrition requirement/status (wound healing, poor po)  ASSESSMENT:   66 y.o. male PMHx Liver cirrhosis with Ascites, Esophageal varices, Portal Hypertensive gastropathy, Hemolytic anemia, Thrombocytopenia, DM type II uncontrolled with complications, S/P right AKA for DM foot infection, Presented to the emergency room with complaints of abdominal pain and frequent diarrhea During the last few weeks he's been having frequent loose stools that were worsening in frequency over the last 2 - 3 days  to the point that he is unable to eat because food intake causes him to run to the bathroom continuously and he has become severely weak..  Diet has just been advanced to a carbohydrate modified diet. Pt reports 25% po this AM at breakfast. He reports he was not able to consume more at his meal due to heart burn. Pt reports not eating well PTA with usual consumption of at least 1 meal a day or at times 1 meal every other day which had been going on for the 2 weeks prior to coming in. Weight has been stable. Pt educated on the importance of adequate nutrition and caloric intake. Pt expressed understanding. He reports he has been trying to increase his intake at meals. Pt currently has Boost Breeze and Prostat ordered and has been  consuming most of them, however reports Boost Breeze has been causing heart burn. RD to modify orders and discontinue Boost Breeze and order Glucerna shake instead. Pt encouraged to eat his foods at meals and to drink his supplements.   Pt with no observed significant fat or muscle mass loss.   Labs and medications reviewed.   Diet Order:  Diet Carb Modified Fluid consistency: Thin; Room service appropriate? Yes  Skin:  Wound (see comment) (ncision on L toe, wound on buttocks, R hip, back)  Last BM:  2/15  Height:   Ht Readings from Last 1 Encounters:  11/01/16 6\' 1"  (1.854 m)    Weight:   Wt Readings from Last 1 Encounters:  11/02/16 240 lb 1.3 oz (108.9 kg)    Ideal Body Weight:  77 kg (adjusted for R AKA)  BMI:  Body mass index is 31.67 kg/m.  Estimated Nutritional Needs:   Kcal:  2200-2400  Protein:  110-125 grams  Fluid:  Per MD  EDUCATION NEEDS:   Education needs no appropriate at this time  Roslyn SmilingStephanie Shawnika Pepin, MS, RD, LDN Pager # [phone] After hours/ weekend pager # [phone]

## 2016-11-02 NOTE — Progress Notes (Signed)
Hospitalist notified that CBG and Insulin orders need clarification. CBG was ordered Q4hrs but insulin AC HS. CBG was 156 and clarification of HS coverage needed to be address. New orders received

## 2016-11-02 NOTE — Care Management (Signed)
Patient will go to SNF for shortterm rehab when medically ready. Social worker is aware.

## 2016-11-02 NOTE — Progress Notes (Signed)
Daily Rounding Note  11/02/2016, 9:53 AM  LOS: 10 days   SUBJECTIVE:   Chief complaint:  Diarrhea   Fewer BMs, only 1 this AM.  Som heartburn.   Note palliative care consultation 10/31/16  OBJECTIVE:         Vital signs in last 24 hours:    Temp:  [97.5 F (36.4 C)-98.9 F (37.2 C)] 97.5 F (36.4 C) (02/15 0800) Pulse Rate:  [60-70] 64 (02/15 0800) Resp:  [10-16] 16 (02/15 0800) BP: (104-124)/(37-59) 114/51 (02/15 0800) SpO2:  [96 %-100 %] 100 % (02/15 0800) Weight:  [108.9 kg (240 lb 1.3 oz)] 108.9 kg (240 lb 1.3 oz) (02/15 0800) Last BM Date: 11/01/16 Filed Weights   11/01/16 0624 11/01/16 0905 11/02/16 0800  Weight: 109.1 kg (240 lb 8.4 oz) 109.1 kg (240 lb 8.4 oz) 108.9 kg (240 lb 1.3 oz)   General: chronically ill.  Comfortable pleasant   Heart: RRR Chest: clear bil.  No dyspnea or cough. Abdomen: soft, obese.  NT, ND  Neuro/Psych:  Oriented x 3, pleasant, in good spirits.   Intake/Output from previous day: 02/14 0701 - 02/15 0700 In: 1290 [P.O.:120; I.V.:320; Blood:300; IV Piggyback:550] Out: 0   Intake/Output this shift: Total I/O In: -  Out: 575 [Urine:575]  Lab Results:  Recent Labs  10/31/16 0755 11/01/16 0402 11/02/16 0303  WBC 7.3 9.3 6.6  HGB 6.0* 7.1* 7.3*  HCT 17.3* 20.7* 21.2*  PLT 73* 67* 52*   BMET  Recent Labs  10/31/16 0341 11/01/16 0402 11/02/16 0303  NA 135 136 137  K 4.5 4.5 4.0  CL 107 107 114*  CO2 18* 18* 19*  GLUCOSE 118* 150* 144*  BUN 35* 45* 52*  CREATININE 2.50* 2.45* 2.29*  CALCIUM 8.4* 8.6* 8.7*   LFT  Recent Labs  11/01/16 0402 11/02/16 0303  ALBUMIN 2.5* 2.2*   PT/INR  Recent Labs  11/01/16 0402 11/02/16 0303  LABPROT 33.0* 35.3*  INR 3.15 3.41   Hepatitis Panel No results for input(s): HEPBSAG, HCVAB, HEPAIGM, HEPBIGM in the last 72 hours.  Studies/Results: No results found.   Scheduled Meds: . ampicillin-sulbactam (UNASYN) IV   3 g Intravenous Q6H  . brimonidine  1 drop Both Eyes Daily  . doxycycline (VIBRAMYCIN) IV  100 mg Intravenous Q12H  . feeding supplement  1 Container Oral BID BM  . feeding supplement (PRO-STAT SUGAR FREE 64)  30 mL Oral TID  . fluconazole (DIFLUCAN) IV  100 mg Intravenous Q24H  . insulin aspart  0-9 Units Subcutaneous TID WC & HS  . loperamide  2 mg Oral TID  . metoCLOPramide (REGLAN) injection  5 mg Intravenous Q6H  . nadolol  20 mg Oral Daily  . nystatin   Topical BID  . pantoprazole (PROTONIX) IV  40 mg Intravenous Q12H  . PARoxetine  20 mg Oral Daily  . saccharomyces boulardii  250 mg Oral BID  . sucralfate  1 g Oral TID WC & HS   Continuous Infusions: PRN Meds:.acetaminophen **OR** acetaminophen, LORazepam, methocarbamol, morphine injection, naLOXone (NARCAN)  injection, ondansetron, promethazine, traZODone   ASSESMENT:   * FOBT + stool. Diarrhea. C diff negative. Both stools and emesis are "dark" but he takes BID Iron.  Colonoscopy in 04/2016: normal. Not having diarrhea then.  Grade 2 esophageal varices and portal gastropathy on EGD 04/2016 CT scan 2/5 with pan colonic wall thickening, probably reflective of his hypoalbuminemia. 11/01/16 EGD revealed superficial, nonbleeding esophageal ulcers  with some patchy mucosal necrosis.  Grade 1 distal esophageal varix. Severe portal hypertensive gastropathy with erythema and adherent blood, erosions. Multiple superficial pan-duodenal ulcers oozing blood to light touch. Suspicion was for NSAID-induced duodenal ulcers. Carafate added to Protonix.  On Nadolol.    *  Diarrhea. TID Imodium added 2/14.    * Normocytic anemia. Had same in 04/2016. On oral iron BID at home. Hemoglobin down 1.8 grams in the last 24 hours.  Hgb stable after PRBC x 3.   * Cirrhosis, decompensated with ascites.  S/p 3.3 liter tap. Not on diuretics, .   *  Progressive coagulopathy.  Has not been challenged with vitamin K  * Thrombocytopenia  chronic and progressive, splenomegaly.   * Osteomyelitis. S/p left Hallux amputation 1 week ago. S/p right AKA. On Diflucan, IV doxycycline, Zosyn  *  Protein calorie malnutrition.  preop human is less than 5.  RG added pro-stat, boost breeze on 2/12.  *  DNR.     PLAN   *   BID oral Protonix.   3 days of Vit K to see if it helps the coagulopathy.  Awaiting pathology of duodenal Bx.     *  Awaiting dopplers of portal vein.    *  Advance to carb mod diet with 2 gm NA.   Continue TID Imodium.    *  Follow coags, platelets.   *  Stop tele, he is DNR.     Jennye MoccasinSarah Taleyah Hillman  11/02/2016, 9:53 AM Pager: [phone]

## 2016-11-03 LAB — CBC
HEMATOCRIT: 21.3 % — AB (ref 39.0–52.0)
Hemoglobin: 7.3 g/dL — ABNORMAL LOW (ref 13.0–17.0)
MCH: 32.3 pg (ref 26.0–34.0)
MCHC: 34.3 g/dL (ref 30.0–36.0)
MCV: 94.2 fL (ref 78.0–100.0)
Platelets: 48 10*3/uL — ABNORMAL LOW (ref 150–400)
RBC: 2.26 MIL/uL — AB (ref 4.22–5.81)
RDW: 17.1 % — ABNORMAL HIGH (ref 11.5–15.5)
WBC: 5.2 10*3/uL (ref 4.0–10.5)

## 2016-11-03 LAB — RENAL FUNCTION PANEL
Albumin: 2 g/dL — ABNORMAL LOW (ref 3.5–5.0)
Anion gap: 6 (ref 5–15)
BUN: 46 mg/dL — ABNORMAL HIGH (ref 6–20)
CHLORIDE: 113 mmol/L — AB (ref 101–111)
CO2: 21 mmol/L — AB (ref 22–32)
CREATININE: 2.15 mg/dL — AB (ref 0.61–1.24)
Calcium: 8.9 mg/dL (ref 8.9–10.3)
GFR calc non Af Amer: 30 mL/min — ABNORMAL LOW (ref >60)
GFR, EST AFRICAN AMERICAN: 35 mL/min — AB (ref >60)
Glucose, Bld: 123 mg/dL — ABNORMAL HIGH (ref 65–99)
POTASSIUM: 4.1 mmol/L (ref 3.5–5.1)
Phosphorus: 3.1 mg/dL (ref 2.5–4.6)
Sodium: 140 mmol/L (ref 135–145)

## 2016-11-03 LAB — GLUCOSE, CAPILLARY
GLUCOSE-CAPILLARY: 136 mg/dL — AB (ref 65–99)
Glucose-Capillary: 146 mg/dL — ABNORMAL HIGH (ref 65–99)
Glucose-Capillary: 167 mg/dL — ABNORMAL HIGH (ref 65–99)
Glucose-Capillary: 168 mg/dL — ABNORMAL HIGH (ref 65–99)
Glucose-Capillary: 193 mg/dL — ABNORMAL HIGH (ref 65–99)

## 2016-11-03 LAB — PROTIME-INR
INR: 2.99
Prothrombin Time: 31.7 seconds — ABNORMAL HIGH (ref 11.4–15.2)

## 2016-11-03 MED ORDER — ALBUMIN HUMAN 5 % IV SOLN
12.5000 g | Freq: Once | INTRAVENOUS | Status: AC
  Administered 2016-11-03: 12.5 g via INTRAVENOUS
  Filled 2016-11-03: qty 250

## 2016-11-03 NOTE — Progress Notes (Signed)
RN notified Zonia KiefJames Owens, Pa, that patient's dressing bled through previous dressing and that drainage was serosanguinous. RN also noticed open area that is yellow in center, it is above patient's sutures. Zonia KiefJames Owens stated to continue to elevate patient's left lower extremity.

## 2016-11-03 NOTE — Progress Notes (Signed)
[facility] Progress Note  Patient: Jeffery BoydenKenneth Korte WUJ:[id number]   PCP: [name],[name], MD DOB: March 03, 1951   DOA: 10/23/2016   DOS: 11/03/2016   Date of Service: the patient was seen and examined on 11/03/2016   Subjective: No acute complaints. No nausea no vomiting. no diarrhea.  Brief hospital course: Pt. with PMH of cirrhosis with ascites, portal hypertension with gastropathy and esophageal varices, hemolytic anemia, chronic thrombocytopenia, right AKA; admitted on 10/23/2016, with complaint of diarrhea, was found to have suspected viral gastroenteritis, GI bleed, left toe osteomyelitis and acute kidney injury. Initially admitted in the step down unit and now transferred to MedSurg floor since 10/30/2016. Currently further plan is continue supportive measures for GI bleed and hypotension.  Assessment and Plan: 1. Persistent diarrhea, improved. C. difficile negative. FOBT positive. GI pathogen panel negative CT scan shows pancolitis versus possible hypoalbuminemia causing wall thickening. Requirement of 14 day treatment course for antibiotics is not helping his diarrhea as well. GI has been consulted. On probiotic, scheduled imodium. monitor response.  2. 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 Pathology report shows clean margins. Discuss with infectious disease, recommended to continue 14 days of antibiotics. Patient was on doxycycline and IV Zosyn. Cultures so far negative. change to IV doxycycline and IV Unasyn while in the hospital. Probiotics added. Last day of antibiotic 11/07/2016. Wound vac has been removed.   3. Cirrhosis of the liver. Portal hypertensive gastropathy, esophageal varices. Duodenal ulcers hematemesis as well as melena. Acute blood loss anemia  GI consulted, appreciate input. Transfuse for hemoglobin less than 7. Nadolol dose has been reduced from 40 mg with 20 mg due to hypotension and acute kidney injury. Hypoalbuminemia, low  fibrinogen level, elevated INR and chronic thrombocytopenia all secondary to cirrhosis. S/P paracentesis 10/24/2016 with 3 L of fluid removed. No evidence of SBP. GI considering Aldactone and Lasix, would not recommend its use in the presence of acute kidney injury Advancing to carb modified diet S/P EGD; on protonix, IV diflucan, gastroenterology initiate her on vitamin K. Will monitor.  Large volume ascites still present, may need taping outpatient. No indication to perform paracentesis at present  4. chronic thrombocytopenia Platelet has been low since last one year. Likely in the setting of liver disease. Elevated INR and low fibrinogen due to liver disease as well. I highly doubt presence of DIC TTP at present. 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 Peripheral smear shows leukocytes without any schistocytes. Stop aspirin and use mechanical prophylaxis for DVT.  5. Acute kidney injury. Seems to improve after blood transfusion. Will monitor with daily weight and strict IN and OUT. Baseline renal function 1.3, on presentation 2.49, now stable, probably multifactorial Due to hypotension and dehydration from diarrhea. Unclear whether surgery as well as paracentesis has any role to play in persistence of renal dysfunction. US renal negative Reduce the dose of nadolol. Given IV albumin.  6. Type 2 diabetes mellitus. The patient remains on clear liquid diet and will continue every 4 hours CBG with sliding scale. Monitor. A1c 5.8 on admission.  Bowel regimen: last BM 11/02/2016 Diet: Carb modified DVT Prophylaxis: mechanical compression device  Advance goals of care discussion: I discussed with patient, his prognosis is significantly guarded in the setting of GI bleed, acute kidney injury, wound infection. Persistent nausea vomiting and diarrhea is also making things worse. Pt is also malnourished, which also render's poor prognosis. Palliative care also discussed with  the patient, code status has been changed from full code  to DNR/DNI.  Patient asked me today that if my prognosis is limited is there meaning in continuing what we're doing? I recommended the rehabilitation opportunity to get him out of the hospital this admission but recommend him to consider  transition to hospice at the time of the discharge. We'll monitor.  Family Communication: no family was present at bedside, at the time of interview.   Disposition:  Discharge to SNF. Expected discharge date: 11/05/2016, resolution GI symptoms and stabilization of H&H, renal function  Consultants: Gastroenterology, orthopedics, palliative care Procedures: First ray amputation of the left leg.  Antibiotics: Anti-infectives    Start     Dose/Rate Route Frequency Ordered Stop   11/01/16 1800  fluconazole (DIFLUCAN) IVPB 100 mg     100 mg 50 mL/hr over 60 Minutes Intravenous Every 24 hours 11/01/16 1142 11/08/16 1759   10/31/16 1800  doxycycline (VIBRAMYCIN) 100 mg in dextrose 5 % 250 mL IVPB     100 mg 125 mL/hr over 120 Minutes Intravenous Every 12 hours 10/31/16 1710     10/31/16 1800  fluconazole (DIFLUCAN) IVPB 100 mg  Status:  Discontinued     100 mg 50 mL/hr over 60 Minutes Intravenous Every 24 hours 10/31/16 1710 11/01/16 1142   10/31/16 1000  fluconazole (DIFLUCAN) tablet 100 mg  Status:  Discontinued     100 mg Oral Daily 10/30/16 1239 10/31/16 1709   10/30/16 2200  doxycycline (VIBRA-TABS) tablet 100 mg  Status:  Discontinued     100 mg Oral Every 12 hours 10/30/16 1238 10/31/16 1709   10/30/16 1400  Ampicillin-Sulbactam (UNASYN) 3 g in sodium chloride 0.9 % 100 mL IVPB     3 g 200 mL/hr over 30 Minutes Intravenous Every 6 hours 10/30/16 1333 11/06/16 1359   10/30/16 0800  fluconazole (DIFLUCAN) IVPB 100 mg  Status:  Discontinued     100 mg 50 mL/hr over 60 Minutes Intravenous Every 24 hours 10/29/16 0826 10/30/16 1238   10/29/16 0800  fluconazole (DIFLUCAN) IVPB 200 mg     200  mg 100 mL/hr over 60 Minutes Intravenous  Once 10/29/16 0739 10/29/16 0914   10/24/16 0600  piperacillin-tazobactam (ZOSYN) IVPB 3.375 g  Status:  Discontinued    Comments:  Zosyn 3.375 g IV q8h for CrCl > 10 mL/min   3.375 g 12.5 mL/hr over 240 Minutes Intravenous Every 8 hours 10/24/16 0130 10/30/16 1231   10/23/16 2330  doxycycline (VIBRAMYCIN) 100 mg in dextrose 5 % 250 mL IVPB  Status:  Discontinued     100 mg 125 mL/hr over 120 Minutes Intravenous Every 12 hours 10/23/16 2322 10/30/16 1231   10/23/16 2330  piperacillin-tazobactam (ZOSYN) IVPB 3.375 g  Status:  Discontinued     3.375 g 12.5 mL/hr over 240 Minutes Intravenous Every 8 hours 10/23/16 2323 10/24/16 0130      Objective: Physical Exam: Vitals:   11/02/16 1157 11/03/16 0100 11/03/16 0500 11/03/16 1300  BP: (!) 111/54 (!) 97/43 (!) 97/44 (!) 105/54  Pulse: (!) 57 69 (!) 59 63  Resp: 14 16 16 18   Temp: 97.6 F (36.4 C) 98 F (36.7 C) 97.8 F (36.6 C) 97.1 F (36.2 C)  TempSrc: Oral Oral Oral Oral  SpO2: 100% 100% 100% 100%  Weight:   109.4 kg (241 lb 2.9 oz)   Height:        Intake/Output Summary (Last 24 hours) at 11/03/16 1528 Last data filed at 11/03/16 1300  Gross per 24 hour  Intake  1270 ml  Output             1251 ml  Net               19 ml   Filed Weights   11/01/16 0905 11/02/16 0800 11/03/16 0500  Weight: 109.1 kg (240 lb 8.4 oz) 108.9 kg (240 lb 1.3 oz) 109.4 kg (241 lb 2.9 oz)    General: Alert, Awake and Oriented to Time, Place and Person. Appear in mild distress, affect appropriate Eyes: PERRL, Conjunctiva normal ENT: Oral Mucosa clear moist. Neck: difficult to assess JVD, no Abnormal Mass Or lumps Cardiovascular: S1 and S2 Present, no Murmur, Respiratory: Bilateral Air entry equal and Decreased, no use of accessory muscle, Clear to Auscultation, no Crackles, no wheezes Abdomen: Bowel Sound present, Soft and mild tenderness Skin: left buttock bruise from prior fall, no redness,  no Rash, no induration Extremities: bilateral Pedal edema, no calf tenderness Neurologic: Grossly no focal neuro deficit. Bilaterally Equal motor strength  Data Reviewed: CBC:  Recent Labs Lab 10/30/16 0708 10/31/16 0755 11/01/16 0402 11/02/16 0303 11/02/16 1925 11/03/16 0531  WBC  --  7.3 9.3 6.6 6.0 5.2  NEUTROABS 6.2  --   --   --   --   --   HGB  --  6.0* 7.1* 7.3* 7.2* 7.3*  HCT  --  17.3* 20.7* 21.2* 20.9* 21.3*  MCV  --  98.9 93.7 93.0 93.7 94.2  PLT  --  73* 67* 52* 52* 48*   Basic Metabolic Panel:  Recent Labs Lab 10/29/16 0226 10/30/16 0315 10/31/16 0341 11/01/16 0402 11/02/16 0303 11/03/16 0531  NA 134* 133* 135 136 137 140  K 4.3 4.6 4.5 4.5 4.0 4.1  CL 107 107 107 107 114* 113*  CO2 21* 19* 18* 18* 19* 21*  GLUCOSE 123* 139* 118* 150* 144* 123*  BUN 22* 26* 35* 45* 52* 46*  CREATININE 1.81* 1.91* 2.50* 2.45* 2.29* 2.15*  CALCIUM 7.9* 8.1* 8.4* 8.6* 8.7* 8.9  MG 1.6* 2.0 2.0  --   --   --   PHOS  --   --   --  3.7 3.2 3.1    Liver Function Tests:  Recent Labs Lab 11/01/16 0402 11/02/16 0303 11/03/16 0531  ALBUMIN 2.5* 2.2* 2.0*   No results for input(s): LIPASE, AMYLASE in the last 168 hours.  Recent Labs Lab 10/29/16 0841  AMMONIA 13   Coagulation Profile:  Recent Labs Lab 10/31/16 0341 11/01/16 0402 11/02/16 0303 11/02/16 1925 11/03/16 0531  INR 2.90 3.15 3.41 3.11 2.99   Cardiac Enzymes: No results for input(s): CKTOTAL, CKMB, CKMBINDEX, TROPONINI in the last 168 hours. BNP (last 3 results) No results for input(s): PROBNP in the last 8760 hours.  CBG:  Recent Labs Lab 11/02/16 1204 11/02/16 1642 11/02/16 2241 11/03/16 0659 11/03/16 1233  GLUCAP 135* 150* 134* 136* 193*    Studies: US Abdomen Limited Ruq  Result Date: 11/02/2016 CLINICAL DATA:  Hepatic cirrhosis. EXAM: US ABDOMEN LIMITED - RIGHT UPPER QUADRANT COMPARISON:  Ultrasound of October 31, 2016. FINDINGS: Gallbladder: 1.5 cm gallstone is noted. No  gallbladder wall thickening or pericholecystic fluid is noted. No sonographic Murphy's sign is noted. Common bile duct: Not visualized. Liver: Liver is small with nodular margins consistent with hepatic cirrhosis. Ascites is noted. IMPRESSION: Ascites. Solitary gallstone without gallbladder wall thickening or pericholecystic fluid. Findings consistent with hepatic cirrhosis. Electronically Signed   By: Lupita Raider, M.D.   On: 11/02/2016 21:47  Scheduled Meds: . ampicillin-sulbactam (UNASYN) IV  3 g Intravenous Q6H  . brimonidine  1 drop Both Eyes Daily  . doxycycline (VIBRAMYCIN) IV  100 mg Intravenous Q12H  . feeding supplement (GLUCERNA SHAKE)  237 mL Oral TID BM  . feeding supplement (PRO-STAT SUGAR FREE 64)  30 mL Oral TID  . fluconazole (DIFLUCAN) IV  100 mg Intravenous Q24H  . insulin aspart  0-9 Units Subcutaneous TID WC & HS  . loperamide  2 mg Oral TID  . metoCLOPramide (REGLAN) injection  5 mg Intravenous Q6H  . nadolol  20 mg Oral Daily  . nystatin   Topical BID  . pantoprazole  40 mg Oral BID  . PARoxetine  20 mg Oral Daily  . phytonadione  10 mg Oral Daily  . saccharomyces boulardii  250 mg Oral BID  . sucralfate  1 g Oral TID WC & HS   Continuous Infusions: PRN Meds: acetaminophen **OR** acetaminophen, LORazepam, methocarbamol, morphine injection, naLOXone (NARCAN)  injection, ondansetron, promethazine, traZODone  Time spent: 30 minutes  Author: Lynden Oxford, MD [facility] Pager: [phone] 11/03/2016 3:28 PM  If 7PM-7AM, please contact night-coverage at [email or web address], password [facility]

## 2016-11-03 NOTE — Progress Notes (Signed)
Checked Pt BP 97/43. Notified [facility] returned call said to cont. To monitor and check CBC in AM.

## 2016-11-03 NOTE — Clinical Social Work Note (Signed)
Although pt states he believes he is ready to go home he has agreed for CSW to start [facility] auth [facility]. CSW spoke with Tammy and Berkley Harveyauth has been started.   [address]Jermario Kalmar [place], [place] [phone]

## 2016-11-03 NOTE — Progress Notes (Signed)
Occupational Therapy Treatment Patient Details Name: Jeffery [name] MRN: [id number] DOB: 1951-02-26 Today's Date: 11/03/2016    History of present illness Pt adm with diarrhea and weakness. Pt also with nonhealing ulcer of lt foot. Pt underwent left first ray amputation. PMH - DM, rt AKA,   OT comments  Pt making progress towards goals this session with AP transfer to recliner. Pt continues to be pleasant and motivated to work with therapy and has good sense of humor. Pt continues to benefit from skilled OT in the acute setting and will require SNF level therapy upon dc to return to PLOF. Next session to focus on LB dressing/bathing  Follow Up Recommendations  SNF;Supervision/Assistance - 24 hour    Equipment Recommendations  Other (comment) (defer to next venue of care)    Recommendations for Other Services      Precautions / Restrictions Precautions Precautions: Fall Precaution Comments: previous Right AKA Required Braces or Orthoses: Other Brace/Splint Other Brace/Splint: Darco Shoe Restrictions Weight Bearing Restrictions: Yes RLE Weight Bearing: Weight bearing as tolerated LLE Weight Bearing: Weight bearing as tolerated Other Position/Activity Restrictions: with Darco shoe on       Mobility Bed Mobility Overal bed mobility: Needs Assistance Bed Mobility: Rolling;Sidelying to Sit Rolling: Min guard Sidelying to sit: Mod assist       General bed mobility comments: assist to elevate trunk into sitting and to gain balance upon sitting; pt limited at times by R hip pain during transitional movements; use of rails and HOB elevated  Transfers Overall transfer level: Needs assistance Equipment used:  (bed pad) Transfers: Licensed conveyancerAnterior-Posterior Transfer       Anterior-Posterior transfers: Max assist;+2 physical assistance   General transfer comment: max vc for hand placement and sequencing/technique; use of bed pad to assist with scooting backwards    Balance Overall  balance assessment: Needs assistance Sitting-balance support: No upper extremity supported;Feet supported Sitting balance-Leahy Scale: Good Sitting balance - Comments: sitting EOB with no back support                            ADL       Grooming: Wash/dry face;Set up;Sitting                   Toilet Transfer: Maximal assistance;+2 for physical assistance;Anterior/posterior;BSC (simulated with recliner)           Functional mobility during ADLs:  (not attempted this session)        Vision                     Perception     Praxis      Cognition   Behavior During Therapy: WFL for tasks assessed/performed Overall Cognitive Status: Within Functional Limits for tasks assessed                       Extremity/Trunk Assessment               Exercises     Shoulder Instructions       General Comments      Pertinent Vitals/ Pain       Pain Assessment: Faces Faces Pain Scale: Hurts even more Pain Location: R hip with mobility Pain Descriptors / Indicators: Grimacing;Guarding;Sore;Sharp Pain Intervention(s): Limited activity within patient's tolerance;Monitored during session;Premedicated before session;Repositioned  Home Living  Prior Functioning/Environment              Frequency  Min 2X/week        Progress Toward Goals  OT Goals(current goals can now be found in the care plan section)  Progress towards OT goals: Progressing toward goals  Acute Rehab OT Goals Patient Stated Goal: get strength back OT Goal Formulation: With patient Time For Goal Achievement: 11/10/16 Potential to Achieve Goals: Good  Plan Discharge plan remains appropriate    Co-evaluation    PT/OT/SLP Co-Evaluation/Treatment: Yes Reason for Co-Treatment: For patient/therapist safety;To address functional/ADL transfers PT goals addressed during session: Mobility/safety with  mobility OT goals addressed during session: ADL's and self-care      End of Session     Activity Tolerance Patient tolerated treatment well   Patient Left in chair;with call bell/phone within reach   Nurse Communication Mobility status;Other (comment) (how to transfer Pt back into bed)        Time: 0981-1914 OT Time Calculation (min): 34 min  Charges: OT General Charges $OT Visit: 1 Procedure OT Treatments $Self Care/Home Management : 8-22 mins  Jeffery [name] 11/03/2016, 3:56 PM  Sherryl Manges OTR/L [phone]

## 2016-11-03 NOTE — Progress Notes (Signed)
Physical Therapy Treatment Patient Details Name: Jeffery [name] MRN: [id number] DOB: 1951-09-01 Today's Date: 11/03/2016    History of Present Illness Pt adm with diarrhea and weakness. Pt also with nonhealing ulcer of lt foot. Pt underwent left first ray amputation. PMH - DM, rt AKA,    PT Comments    Patient able to do AP transfer bed to recliner with max +2 assist. Pt continues to make gradual progress toward mobility goals and appeared to be in better spirits today. Continue to progress as tolerated with anticipated d/c to SNF for further skilled PT services.    Follow Up Recommendations  SNF     Equipment Recommendations  None recommended by PT    Recommendations for Other Services       Precautions / Restrictions Precautions Precautions: Fall Precaution Comments: previous Right AKA Required Braces or Orthoses: Other Brace/Splint (WBAT with DARCO shoe) Other Brace/Splint: Darco Shoe Restrictions Weight Bearing Restrictions: Yes RLE Weight Bearing: Weight bearing as tolerated LLE Weight Bearing: Weight bearing as tolerated Other Position/Activity Restrictions: with Darco shoe on    Mobility  Bed Mobility Overal bed mobility: Needs Assistance Bed Mobility: Rolling;Sidelying to Sit Rolling: Min guard Sidelying to sit: Mod assist       General bed mobility comments: assist to elevate trunk into sitting and to gain balance upon sitting; pt limited at times by R hip pain during transitional movements; use of rails and HOB elevated  Transfers Overall transfer level: Needs assistance Equipment used:  (bed pad) Transfers: Licensed conveyancerAnterior-Posterior Transfer       Anterior-Posterior transfers: Max assist;+2 physical assistance   General transfer comment: max vc for hand placement and sequencing/technique; use of bed pad to assist with scooting backwards  Ambulation/Gait                 Stairs            Wheelchair Mobility    Modified Rankin (Stroke  Patients Only)       Balance Overall balance assessment: Needs assistance Sitting-balance support: No upper extremity supported;Feet supported Sitting balance-Leahy Scale: Good Sitting balance - Comments: sitting EOB with no back support                             Cognition Arousal/Alertness: Awake/alert Behavior During Therapy: WFL for tasks assessed/performed Overall Cognitive Status: Within Functional Limits for tasks assessed                      Exercises      General Comments        Pertinent Vitals/Pain Pain Assessment: Faces Faces Pain Scale: Hurts even more Pain Location: R hip with mobility Pain Descriptors / Indicators: Grimacing;Guarding;Sore;Sharp Pain Intervention(s): Limited activity within patient's tolerance;Monitored during session;Premedicated before session;Repositioned    Home Living                      Prior Function            PT Goals (current goals can now be found in the care plan section) Acute Rehab PT Goals Patient Stated Goal: get strength back Progress towards PT goals: Progressing toward goals    Frequency    Min 3X/week      PT Plan Current plan remains appropriate    Co-evaluation PT/OT/SLP Co-Evaluation/Treatment: Yes Reason for Co-Treatment: For patient/therapist safety PT goals addressed during session: Mobility/safety with mobility  End of Session Equipment Utilized During Treatment: Gait belt Activity Tolerance: Patient tolerated treatment well Patient left: with call bell/phone within reach;in chair     Time: 1610-9604 PT Time Calculation (min) (ACUTE ONLY): 34 min  Charges:  $Therapeutic Activity: 8-22 mins                    G Codes:      Derek Mound, PTA Pager: [phone]   11/03/2016, 1:19 PM

## 2016-11-03 NOTE — Progress Notes (Signed)
[facility] Progress Note  Chief Complaint:   Vomiting, diarrhea, abdominal pain, anemia  Subjective: No further nausea, vomiting or diarrhea. Eating.   Objective:  Vital signs in last 24 hours: Temp:  [97.6 F (36.4 C)-98 F (36.7 C)] 97.8 F (36.6 C) (02/16 0500) Pulse Rate:  [57-69] 59 (02/16 0500) Resp:  [14-16] 16 (02/16 0500) BP: (97-111)/(43-54) 97/44 (02/16 0500) SpO2:  [100 %] 100 % (02/16 0500) Weight:  [241 lb 2.9 oz (109.4 kg)] 241 lb 2.9 oz (109.4 kg) (02/16 0500) Last BM Date: 11/02/16 General:   Alert, obese white male in NAD EENT:  Normal hearing, non icteric sclera, conjunctive pale.  Heart:  Regular rate and rhythm; no LLE edema Pulm: Normal respiratory effort, lungs CTA bilaterally without wheezes or crackles. Abdomen:  Soft, obese, mildly distended, +abdominal wall edema, normal bowel sounds, Neurologic:  Alert and  oriented x4;  grossly normal neurologically. Psych:  Alert and cooperative. Normal mood and affect.   Intake/Output from previous day: 02/15 0701 - 02/16 0700 In: 950 [IV Piggyback:950] Out: 1276 [Urine:1275; Stool:1] Intake/Output this shift: No intake/output data recorded.  Lab Results:  Recent Labs  11/02/16 0303 11/02/16 1925 11/03/16 0531  WBC 6.6 6.0 5.2  HGB 7.3* 7.2* 7.3*  HCT 21.2* 20.9* 21.3*  PLT 52* 52* 48*   BMET  Recent Labs  11/01/16 0402 11/02/16 0303 11/03/16 0531  NA 136 137 140  K 4.5 4.0 4.1  CL 107 114* 113*  CO2 18* 19* 21*  GLUCOSE 150* 144* 123*  BUN 45* 52* 46*  CREATININE 2.45* 2.29* 2.15*  CALCIUM 8.6* 8.7* 8.9   LFT  Recent Labs  11/03/16 0531  ALBUMIN 2.0*   PT/INR  Recent Labs  11/02/16 1925 11/03/16 0531  LABPROT 32.7* 31.7*  INR 3.11 2.99    [place] Abdomen Limited Ruq  Result Date: 11/02/2016 CLINICAL DATA:  Hepatic cirrhosis. EXAM: US ABDOMEN LIMITED - RIGHT UPPER QUADRANT COMPARISON:  Ultrasound of October 31, 2016. FINDINGS: Gallbladder: 1.5 cm  gallstone is noted. No gallbladder wall thickening or pericholecystic fluid is noted. No sonographic Murphy's sign is noted. Common bile duct: Not visualized. Liver: Liver is small with nodular margins consistent with hepatic cirrhosis. Ascites is noted. IMPRESSION: Ascites. Solitary gallstone without gallbladder wall thickening or pericholecystic fluid. Findings consistent with hepatic cirrhosis. Electronically Signed   By: Lupita RaiderJames  Green Jr, M.D.   On: 11/02/2016 21:47    Assessment / Plan:  1. 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 Christella HartiganJacobs.  / ascites, admitted with abdominal pain, vomiting and diarrhea. C-diff negative. No evidence of SBP on paracentesis. We started low dose aldactone but renal function declined so it was held. His vomiting and diarrhea have resolved.  -May restart diuretics outpatient but need to evaluate him in clinic first.  -Reiterated need for 2gm sodium diet. He already has an appt with Dr. Christella HartiganJacobs on the 27th of this month.  -Ascites interfered with ability to do doppler study on portal vein.  -normal AFP, no focal liver lesions on u/s in December  2. ABL. EGD revealed many superficial esophageal ulcers with no stigmata of recent bleeding but patchy mucosal necrosis was found 25 to 40 cm from incisions.  Grade I varices , severe portal hypertensive gastropathy and many friable superficial duodenal ulcers found in entire duodenum, probably NSAID induced.   -duodenal biopsies pending, we can discuss at follow up visit.  -avoid NSAIDS -continue BID PPI.   3. Diabetes,  seems well controlled. Recent A1c 5.8. He has been refusing reglan. Vomiting has resolved anyway    Principal Problem:   Osteomyelitis of left foot (HCC) Active Problems:   Hemolytic anemia (HCC)   Cirrhosis of liver with ascites (HCC)   Esophageal varices without bleeding (HCC)   Portal hypertensive gastropathy   C. difficile colitis   AKI (acute kidney injury)  (HCC)   Diarrhea   Noninfectious gastroenteritis   Gastrointestinal hemorrhage   Controlled diabetes mellitus type 2 with complications (HCC)   Anxiety state   Somnolence   Moderate malnutrition (HCC)   Multiple duodenal ulcers   Ulcer of esophagus without bleeding   Melena   Chronic liver failure without hepatic coma (HCC)   Advance care planning   Goals of care, counseling/discussion   Palliative care by specialist   End stage liver disease (HCC)     LOS: 11 days   Willette Cluster NP 11/03/2016, 11:20 AM  Pager number [phone]

## 2016-11-03 NOTE — Progress Notes (Signed)
OT Progress Note:  Pt continues to make progress this session demonstrating ability to increase participation in AP transfer back up into bed this session. Pt continues to benefit from skilled OT in the acute care setting, and requires SNF level therapy to return to PLOF. Next session to focus on LB dressig/bathing.     11/03/16 1557  OT Visit Information  Last OT Received On 11/03/16  Assistance Needed +2  History of Present Illness Pt adm with diarrhea and weakness. Pt also with nonhealing ulcer of lt foot. Pt underwent left first ray amputation. PMH - DM, rt AKA,  Precautions  Precautions Fall  Precaution Comments previous Right AKA  Required Braces or Orthoses Other Brace/Splint  Other Brace/Splint Darco Shoe  Pain Assessment  Pain Assessment 0-10  Pain Score 6  Pain Location R hip with mobility and bottom  Pain Descriptors / Indicators Grimacing;Guarding;Sore;Sharp  Pain Intervention(s) Limited activity within patient's tolerance;Monitored during session;Repositioned  Cognition  Arousal/Alertness Awake/alert  Behavior During Therapy WFL for tasks assessed/performed  Overall Cognitive Status Within Functional Limits for tasks assessed  ADL  Overall ADL's  Needs assistance/impaired  Toilet Transfer Maximal assistance;+2 for physical assistance;Anterior/posterior;BSC  Bed Mobility  Overal bed mobility Needs Assistance  Bed Mobility Rolling;Sidelying to Sit  Rolling Min guard  Sidelying to sit Mod assist  General bed mobility comments use of bed pad to assist Pt to re-position in the bed, vc for sequencing to position correctly  Balance  Overall balance assessment Needs assistance  Sitting-balance support No upper extremity supported;Feet supported  Sitting balance-Leahy Scale Good  Sitting balance - Comments sitting EOB with no back support   Restrictions  Weight Bearing Restrictions Yes  LLE Weight Bearing WBAT  Other Position/Activity Restrictions with Darco shoe on   Transfers  Overall transfer level Needs assistance  Equipment used (bed pad)  Medical laboratory scientific officerTransfers Anterior-Posterior Transfer  Anterior-Posterior transfers Max assist;+2 physical assistance  General transfer comment max vc for hand placement and sequencing/technique; use of bed pad to assist with scooting forwards back into bed  General Comments  General comments (skin integrity, edema, etc.) Pt really enjoyed listening to music during session  OT - End of Session  Activity Tolerance Patient tolerated treatment well  Patient left in bed;with call bell/phone within reach;with nursing/sitter in room  Nurse Communication Mobility status;Weight bearing status  OT Assessment/Plan  OT Plan Discharge plan remains appropriate  OT Frequency (ACUTE ONLY) Min 2X/week  Follow Up Recommendations SNF;Supervision/Assistance - 24 hour  OT Equipment Other (comment) (defer to next venue)  OT Goal Progression  Progress towards OT goals Progressing toward goals  Acute Rehab OT Goals  Patient Stated Goal get strength back  OT Goal Formulation With patient  Time For Goal Achievement 11/10/16  Potential to Achieve Goals Good  OT Time Calculation  OT Start Time (ACUTE ONLY) 1423  OT Stop Time (ACUTE ONLY) 1437  OT Time Calculation (min) 14 min  OT General Charges  $OT Visit 1 Procedure  OT Treatments  $Therapeutic Activity  8-22 mins   Sherryl MangesLaura Shakela Donati OTR/L [phone]

## 2016-11-04 LAB — RENAL FUNCTION PANEL
Albumin: 2 g/dL — ABNORMAL LOW (ref 3.5–5.0)
Anion gap: 6 (ref 5–15)
BUN: 42 mg/dL — ABNORMAL HIGH (ref 6–20)
CHLORIDE: 111 mmol/L (ref 101–111)
CO2: 20 mmol/L — AB (ref 22–32)
CREATININE: 1.92 mg/dL — AB (ref 0.61–1.24)
Calcium: 8.7 mg/dL — ABNORMAL LOW (ref 8.9–10.3)
GFR, EST AFRICAN AMERICAN: 40 mL/min — AB (ref >60)
GFR, EST NON AFRICAN AMERICAN: 35 mL/min — AB (ref >60)
Glucose, Bld: 129 mg/dL — ABNORMAL HIGH (ref 65–99)
Phosphorus: 3.2 mg/dL (ref 2.5–4.6)
Potassium: 3.7 mmol/L (ref 3.5–5.1)
Sodium: 137 mmol/L (ref 135–145)

## 2016-11-04 LAB — CBC
HCT: 24.3 % — ABNORMAL LOW (ref 39.0–52.0)
HEMATOCRIT: 21.4 % — AB (ref 39.0–52.0)
HEMOGLOBIN: 8.1 g/dL — AB (ref 13.0–17.0)
Hemoglobin: 7.2 g/dL — ABNORMAL LOW (ref 13.0–17.0)
MCH: 32.3 pg (ref 26.0–34.0)
MCH: 31.6 pg (ref 26.0–34.0)
MCHC: 33.6 g/dL (ref 30.0–36.0)
MCHC: 33.3 g/dL (ref 30.0–36.0)
MCV: 96 fL (ref 78.0–100.0)
MCV: 94.9 fL (ref 78.0–100.0)
PLATELETS: 54 10*3/uL — AB (ref 150–400)
Platelets: 46 10*3/uL — ABNORMAL LOW (ref 150–400)
RBC: 2.23 MIL/uL — ABNORMAL LOW (ref 4.22–5.81)
RBC: 2.56 MIL/uL — ABNORMAL LOW (ref 4.22–5.81)
RDW: 17.1 % — AB (ref 11.5–15.5)
RDW: 17.3 % — ABNORMAL HIGH (ref 11.5–15.5)
WBC: 5.1 10*3/uL (ref 4.0–10.5)
WBC: 6.7 10*3/uL (ref 4.0–10.5)

## 2016-11-04 LAB — PREPARE RBC (CROSSMATCH)

## 2016-11-04 LAB — GLUCOSE, CAPILLARY
GLUCOSE-CAPILLARY: 148 mg/dL — AB (ref 65–99)
GLUCOSE-CAPILLARY: 167 mg/dL — AB (ref 65–99)
GLUCOSE-CAPILLARY: 168 mg/dL — AB (ref 65–99)
Glucose-Capillary: 122 mg/dL — ABNORMAL HIGH (ref 65–99)

## 2016-11-04 LAB — PROTIME-INR
INR: 2.6
Prothrombin Time: 28.4 seconds — ABNORMAL HIGH (ref 11.4–15.2)

## 2016-11-04 MED ORDER — SPIRONOLACTONE 25 MG PO TABS
25.0000 mg | ORAL_TABLET | Freq: Every day | ORAL | Status: DC
  Administered 2016-11-04 – 2016-11-08 (×5): 25 mg via ORAL
  Filled 2016-11-04 (×5): qty 1

## 2016-11-04 MED ORDER — SPIRONOLACTONE 25 MG PO TABS: 50.0000 mg | ORAL_TABLET | Freq: Every day | ORAL | Status: DC

## 2016-11-04 MED ORDER — SODIUM CHLORIDE 0.9 % IV SOLN
Freq: Once | INTRAVENOUS | Status: AC
  Administered 2016-11-04: 11:00:00 via INTRAVENOUS

## 2016-11-04 NOTE — Progress Notes (Signed)
Subjective: Patient stable.  Foot dressing dry.  Pain controlled.   Objective: Vital signs in last 24 hours: Temp:  [97.1 F (36.2 C)-97.9 F (36.6 C)] 97.9 F (36.6 C) (02/17 0547) Pulse Rate:  [63-71] 71 (02/17 0547) Resp:  [18] 18 (02/17 0547) BP: (104-105)/(50-54) 104/50 (02/17 0547) SpO2:  [97 %-100 %] 100 % (02/17 0547) Weight:  [246 lb 4.1 oz (111.7 kg)] 246 lb 4.1 oz (111.7 kg) (02/17 0547)  Intake/Output from previous day: 02/16 0701 - 02/17 0700 In: 520 [P.O.:520] Out: 950 [Urine:950] Intake/Output this shift: No intake/output data recorded.  Exam:  No cellulitis present  Labs:  Recent Labs  11/02/16 0303 11/02/16 1925 11/03/16 0531 11/04/16 0535  HGB 7.3* 7.2* 7.3* 7.2*    Recent Labs  11/03/16 0531 11/04/16 0535  WBC 5.2 5.1  RBC 2.26* 2.23*  HCT 21.3* 21.4*  PLT 48* 46*    Recent Labs  11/03/16 0531 11/04/16 0535  NA 140 137  K 4.1 3.7  CL 113* 111  CO2 21* 20*  BUN 46* 42*  CREATININE 2.15* 1.92*  GLUCOSE 123* 129*  CALCIUM 8.9 8.7*    Recent Labs  11/03/16 0531 11/04/16 0535  INR 2.99 2.60    Assessment/Plan: Okay to be weightbearing on heel.  Dr. Ophelia CharterYates will see the patient on Monday.  Patient has a boot for transfers   Mirant Scott Dean 11/04/2016, 11:21 AM

## 2016-11-04 NOTE — Progress Notes (Signed)
[facility] Progress Note  Patient: Jeffery [name] ZOX:[id number]   PCP: Julian Hy, MD DOB: 05-11-1951   DOA: 10/23/2016   DOS: 11/04/2016   Date of Service: the patient was seen and examined on 11/04/2016   Subjective: No acute complaints. No nausea no vomiting. no diarrhea.Feeling better  Brief hospital course: Pt. with PMH of cirrhosis with ascites, portal hypertension with gastropathy and esophageal varices, hemolytic anemia, chronic thrombocytopenia, right AKA; admitted on 10/23/2016, with complaint of diarrhea, was found to have suspected viral gastroenteritis, GI bleed, left toe osteomyelitis and acute kidney injury. Initially admitted in the step down unit and now transferred to MedSurg floor since 10/30/2016. Currently further plan is continue supportive measures for GI bleed and hypotension.  Assessment and Plan: 1. Persistent diarrhea, resolved. C. difficile negative. FOBT positive. GI pathogen panel negative CT scan shows pancolitis versus possible hypoalbuminemia causing wall thickening. Requirement of 14 day treatment course for antibiotics is not helping his diarrhea as well. GI has been consulted. On probiotic, scheduled imodium. monitor response.  2. 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 Pathology report shows clean margins. Discuss with infectious disease, recommended to continue 14 days of antibiotics. Patient was on doxycycline and IV Zosyn. Cultures so far negative. change to IV doxycycline and IV Unasyn while in the hospital. Probiotics added. Last day of antibiotic 11/07/2016. Wound vac has been removed.   3. Cirrhosis of the liver. Portal hypertensive gastropathy, esophageal varices. Duodenal ulcers hematemesis as well as melena. Acute blood loss anemia  GI consulted, appreciate input. Transfuse for hemoglobin less than 7. Nadolol dose has been reduced from 40 mg with 20 mg due to hypotension and acute kidney  injury. Hypoalbuminemia, low fibrinogen level, elevated INR and chronic thrombocytopenia all secondary to cirrhosis. S/P paracentesis 10/24/2016 with 3 L of fluid removed. No evidence of SBP. GI considering Aldactone and Lasix, would not recommend its use in the presence of acute kidney injury Advancing to carb modified diet S/P EGD; on protonix, IV diflucan, gastroenterology initiate her on vitamin K. Will monitor.  Large volume ascites still present, may need taping outpatient. No indication to perform paracentesis at present. We'll start 50 mg Aldactone today. Hemoglobin 7.1, transfuse 1 unit of PRBC today.  4. chronic thrombocytopenia Platelet has been low since last one year. Likely in the setting of liver disease. Elevated INR and low fibrinogen due to liver disease as well. I highly doubt presence of DIC TTP at present. 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 Peripheral smear shows leukocytes without any schistocytes. Stop aspirin and use mechanical prophylaxis for DVT. I will transfuse one unit today due to requirement for multiple PRBCs  5. Acute kidney injury. Seems to improve after blood transfusion. Will monitor with daily weight and strict IN and OUT. Baseline renal function 1.3, on presentation 2.49, now stable, probably multifactorial Due to hypotension and dehydration from diarrhea. Unclear whether surgery as well as paracentesis has any role to play in persistence of renal dysfunction. US renal negative Reduce the dose of nadolol. Given IV albumin. Currently stopped. Resuming only Aldactone today.  6. Type 2 diabetes mellitus. The patient remains on clear liquid diet and will continue every 4 hours CBG with sliding scale. Monitor. A1c 5.8 on admission.  Bowel regimen: last BM 11/02/2016 Diet: Carb modified DVT Prophylaxis: mechanical compression device  Advance goals of care discussion: I discussed with patient, his prognosis is significantly  guarded in the setting of GI bleed, acute kidney injury, wound infection. Persistent  nausea vomiting and diarrhea is also making things worse. Pt is also malnourished, which also render's poor prognosis. Palliative care also discussed with the patient, code status has been changed from full code to DNR/DNI.  Patient asked me today that if my prognosis is limited is there meaning in continuing what we're doing? I recommended the rehabilitation opportunity to get him out of the hospital this admission but recommend him to consider  transition to hospice at the time of the discharge. We'll monitor.  Family Communication: no family was present at bedside, at the time of interview.   Disposition:  Discharge to SNF. Expected discharge date: 11/06/2016, stabilization of H&H, renal function  Consultants: Gastroenterology, orthopedics, palliative care Procedures: First ray amputation of the left leg.  Antibiotics: Anti-infectives    Start     Dose/Rate Route Frequency Ordered Stop   11/01/16 1800  fluconazole (DIFLUCAN) IVPB 100 mg     100 mg 50 mL/hr over 60 Minutes Intravenous Every 24 hours 11/01/16 1142 11/08/16 1759   10/31/16 1800  doxycycline (VIBRAMYCIN) 100 mg in dextrose 5 % 250 mL IVPB     100 mg 125 mL/hr over 120 Minutes Intravenous Every 12 hours 10/31/16 1710     10/31/16 1800  fluconazole (DIFLUCAN) IVPB 100 mg  Status:  Discontinued     100 mg 50 mL/hr over 60 Minutes Intravenous Every 24 hours 10/31/16 1710 11/01/16 1142   10/31/16 1000  fluconazole (DIFLUCAN) tablet 100 mg  Status:  Discontinued     100 mg Oral Daily 10/30/16 1239 10/31/16 1709   10/30/16 2200  doxycycline (VIBRA-TABS) tablet 100 mg  Status:  Discontinued     100 mg Oral Every 12 hours 10/30/16 1238 10/31/16 1709   10/30/16 1400  Ampicillin-Sulbactam (UNASYN) 3 g in sodium chloride 0.9 % 100 mL IVPB     3 g 200 mL/hr over 30 Minutes Intravenous Every 6 hours 10/30/16 1333 11/06/16 1359   10/30/16 0800   fluconazole (DIFLUCAN) IVPB 100 mg  Status:  Discontinued     100 mg 50 mL/hr over 60 Minutes Intravenous Every 24 hours 10/29/16 0826 10/30/16 1238   10/29/16 0800  fluconazole (DIFLUCAN) IVPB 200 mg     200 mg 100 mL/hr over 60 Minutes Intravenous  Once 10/29/16 0739 10/29/16 0914   10/24/16 0600  piperacillin-tazobactam (ZOSYN) IVPB 3.375 g  Status:  Discontinued    Comments:  Zosyn 3.375 g IV q8h for CrCl > 10 mL/min   3.375 g 12.5 mL/hr over 240 Minutes Intravenous Every 8 hours 10/24/16 0130 10/30/16 1231   10/23/16 2330  doxycycline (VIBRAMYCIN) 100 mg in dextrose 5 % 250 mL IVPB  Status:  Discontinued     100 mg 125 mL/hr over 120 Minutes Intravenous Every 12 hours 10/23/16 2322 10/30/16 1231   10/23/16 2330  piperacillin-tazobactam (ZOSYN) IVPB 3.375 g  Status:  Discontinued     3.375 g 12.5 mL/hr over 240 Minutes Intravenous Every 8 hours 10/23/16 2323 10/24/16 0130      Objective: Physical Exam: Vitals:   11/04/16 1252 11/04/16 1308 11/04/16 1445 11/04/16 1500  BP: 102/63 132/69 (!) 107/57 119/63  Pulse: 63 69 62 65  Resp:      Temp: 98.6 F (37 C) 98.6 F (37 C) 98.6 F (37 C) 98.5 F (36.9 C)  TempSrc: Oral Oral Oral Oral  SpO2: 98% 100% 97% 98%  Weight:      Height:        Intake/Output Summary (Last 24 hours)  at 11/04/16 1658 Last data filed at 11/04/16 1308  Gross per 24 hour  Intake               32 ml  Output              400 ml  Net             -368 ml   Filed Weights   11/02/16 0800 11/03/16 0500 11/04/16 0547  Weight: 108.9 kg (240 lb 1.3 oz) 109.4 kg (241 lb 2.9 oz) 111.7 kg (246 lb 4.1 oz)    General: Alert, Awake and Oriented to Time, Place and Person. Appear in mild distress, affect appropriate Eyes: PERRL, Conjunctiva normal ENT: Oral Mucosa clear moist. Neck: difficult to assess JVD, no Abnormal Mass Or lumps Cardiovascular: S1 and S2 Present, no Murmur, Respiratory: Bilateral Air entry equal and Decreased, no use of accessory muscle,  Clear to Auscultation, no Crackles, no wheezes Abdomen: Bowel Sound present, Soft and mild tenderness Skin: left buttock bruise from prior fall, no redness, no Rash, no induration Extremities: bilateral Pedal edema, no calf tenderness Neurologic: Grossly no focal neuro deficit. Bilaterally Equal motor strength  Data Reviewed: CBC:  Recent Labs Lab 10/30/16 0708  11/01/16 0402 11/02/16 0303 11/02/16 1925 11/03/16 0531 11/04/16 0535  WBC  --   < > 9.3 6.6 6.0 5.2 5.1  NEUTROABS 6.2  --   --   --   --   --   --   HGB  --   < > 7.1* 7.3* 7.2* 7.3* 7.2*  HCT  --   < > 20.7* 21.2* 20.9* 21.3* 21.4*  MCV  --   < > 93.7 93.0 93.7 94.2 96.0  PLT  --   < > 67* 52* 52* 48* 46*  < > = values in this interval not displayed. Basic Metabolic Panel:  Recent Labs Lab 10/29/16 0226 10/30/16 0315 10/31/16 0341 11/01/16 0402 11/02/16 0303 11/03/16 0531 11/04/16 0535  NA 134* 133* 135 136 137 140 137  K 4.3 4.6 4.5 4.5 4.0 4.1 3.7  CL 107 107 107 107 114* 113* 111  CO2 21* 19* 18* 18* 19* 21* 20*  GLUCOSE 123* 139* 118* 150* 144* 123* 129*  BUN 22* 26* 35* 45* 52* 46* 42*  CREATININE 1.81* 1.91* 2.50* 2.45* 2.29* 2.15* 1.92*  CALCIUM 7.9* 8.1* 8.4* 8.6* 8.7* 8.9 8.7*  MG 1.6* 2.0 2.0  --   --   --   --   PHOS  --   --   --  3.7 3.2 3.1 3.2    Liver Function Tests:  Recent Labs Lab 11/01/16 0402 11/02/16 0303 11/03/16 0531 11/04/16 0535  ALBUMIN 2.5* 2.2* 2.0* 2.0*   No results for input(s): LIPASE, AMYLASE in the last 168 hours.  Recent Labs Lab 10/29/16 0841  AMMONIA 13   Coagulation Profile:  Recent Labs Lab 11/01/16 0402 11/02/16 0303 11/02/16 1925 11/03/16 0531 11/04/16 0535  INR 3.15 3.41 3.11 2.99 2.60   Cardiac Enzymes: No results for input(s): CKTOTAL, CKMB, CKMBINDEX, TROPONINI in the last 168 hours. BNP (last 3 results) No results for input(s): PROBNP in the last 8760 hours.  CBG:  Recent Labs Lab 11/03/16 2047 11/03/16 2357 11/04/16 0622  11/04/16 1159 11/04/16 1629  GLUCAP 168* 146* 122* 148* 167*    Studies: No results found.   Scheduled Meds: . sodium chloride   Intravenous Once  . ampicillin-sulbactam (UNASYN) IV  3 g Intravenous Q6H  . brimonidine  1 drop Both Eyes Daily  . doxycycline (VIBRAMYCIN) IV  100 mg Intravenous Q12H  . feeding supplement (GLUCERNA SHAKE)  237 mL Oral TID BM  . feeding supplement (PRO-STAT SUGAR FREE 64)  30 mL Oral TID  . fluconazole (DIFLUCAN) IV  100 mg Intravenous Q24H  . insulin aspart  0-9 Units Subcutaneous TID WC & HS  . loperamide  2 mg Oral TID  . metoCLOPramide (REGLAN) injection  5 mg Intravenous Q6H  . nadolol  20 mg Oral Daily  . nystatin   Topical BID  . pantoprazole  40 mg Oral BID  . PARoxetine  20 mg Oral Daily  . saccharomyces boulardii  250 mg Oral BID  . sucralfate  1 g Oral TID WC & HS   Continuous Infusions: PRN Meds: acetaminophen **OR** acetaminophen, LORazepam, methocarbamol, morphine injection, naLOXone (NARCAN)  injection, ondansetron, promethazine, traZODone  Time spent: 30 minutes  Author: Lynden Oxford, MD [facility] Pager: [phone] 11/04/2016 4:58 PM  If 7PM-7AM, please contact night-coverage at [email or web address], password [facility]

## 2016-11-05 LAB — CBC
HCT: 24.9 % — ABNORMAL LOW (ref 39.0–52.0)
Hemoglobin: 8.5 g/dL — ABNORMAL LOW (ref 13.0–17.0)
MCH: 32.6 pg (ref 26.0–34.0)
MCHC: 34.1 g/dL (ref 30.0–36.0)
MCV: 95.4 fL (ref 78.0–100.0)
PLATELETS: 48 10*3/uL — AB (ref 150–400)
RBC: 2.61 MIL/uL — AB (ref 4.22–5.81)
RDW: 17.8 % — ABNORMAL HIGH (ref 11.5–15.5)
WBC: 5.6 10*3/uL (ref 4.0–10.5)

## 2016-11-05 LAB — TYPE AND SCREEN
Blood Product Expiration Date: 201803102359
ISSUE DATE / TIME: 201802171431
Unit Type and Rh: 6200

## 2016-11-05 LAB — RENAL FUNCTION PANEL
Albumin: 1.8 g/dL — ABNORMAL LOW (ref 3.5–5.0)
Anion gap: 7 (ref 5–15)
BUN: 38 mg/dL — ABNORMAL HIGH (ref 6–20)
CHLORIDE: 109 mmol/L (ref 101–111)
CO2: 18 mmol/L — ABNORMAL LOW (ref 22–32)
CREATININE: 1.71 mg/dL — AB (ref 0.61–1.24)
Calcium: 8.3 mg/dL — ABNORMAL LOW (ref 8.9–10.3)
GFR, EST AFRICAN AMERICAN: 46 mL/min — AB (ref >60)
GFR, EST NON AFRICAN AMERICAN: 40 mL/min — AB (ref >60)
Glucose, Bld: 154 mg/dL — ABNORMAL HIGH (ref 65–99)
Phosphorus: 3.3 mg/dL (ref 2.5–4.6)
Potassium: 3.6 mmol/L (ref 3.5–5.1)
Sodium: 134 mmol/L — ABNORMAL LOW (ref 135–145)

## 2016-11-05 LAB — GLUCOSE, CAPILLARY
GLUCOSE-CAPILLARY: 137 mg/dL — AB (ref 65–99)
GLUCOSE-CAPILLARY: 119 mg/dL — AB (ref 65–99)
Glucose-Capillary: 150 mg/dL — ABNORMAL HIGH (ref 65–99)
Glucose-Capillary: 163 mg/dL — ABNORMAL HIGH (ref 65–99)

## 2016-11-05 LAB — PREPARE PLATELET PHERESIS
BLOOD PRODUCT EXPIRATION DATE: 201802182359
ISSUE DATE / TIME: 201802171236
UNIT TYPE AND RH: 6200

## 2016-11-05 LAB — PROTIME-INR
INR: 2.6
Prothrombin Time: 28.3 seconds — ABNORMAL HIGH (ref 11.4–15.2)

## 2016-11-05 NOTE — Progress Notes (Signed)
[facility] PROGRESS NOTE  Patient: Jeffery [name] ZOX:[id number]   PCP: Jeffery HySOUTH,[name], MD DOB: [birth year]   DOA: 10/23/2016   DOS: 11/05/2016    Subjective: called by RN to evaluate the bleeding wound. Pressure dressing applied already on my arrival  Objective:  Vitals:   11/05/16 1000 11/05/16 1342  BP: 108/66 107/60  Pulse: 62 (!) 56  Resp: 20 20  Temp:  98.2 F (36.8 C)    Bleeding seems to have stopped. No tenderenss  Assessment and plan: Acute bleeding from surgical wound Pressure dressing has been applied and bleeding has been stopped for now Will monitor. Has been receiving Vitamin K and platelet transfusion without any significant improvement in INR or platelets. No indication for transfusion at present. If drops BP or has recurrent bleeding, suggest to check CBC and transfuse as needed. Dr Jeffery [name] will see the pt in AM and will decide further plan. NPO after midnight in anticipation for procedure.  Author: Lynden OxfordPranav Suan Pyeatt, MD [facility] Pager: [phone] 11/05/2016 5:10 PM   If 7PM-7AM, please contact night-coverage at [email or web address], password [email or web address]

## 2016-11-05 NOTE — Progress Notes (Signed)
[facility] Progress Note  Patient: Jeffery [name] WUJ:[id number]   PCP: Julian Hy, MD DOB: Nov 04, 1950   DOA: 10/23/2016   DOS: 11/05/2016   Date of Service: the patient was seen and examined on 11/05/2016   Subjective: No acute complaints. Surgery concern with wound.  Brief hospital course: Pt. with PMH of cirrhosis with ascites, portal hypertension with gastropathy and esophageal varices, hemolytic anemia, chronic thrombocytopenia, right AKA; admitted on 10/23/2016, with complaint of diarrhea, was found to have suspected viral gastroenteritis, GI bleed, left toe osteomyelitis and acute kidney injury. Initially admitted in the step down unit and now transferred to MedSurg floor since 10/30/2016. Currently further plan is follow Dr Ophelia Charter Recommendation tomorrow.  Assessment and Plan: 1. Persistent diarrhea, resolved. C. difficile negative. FOBT positive. GI pathogen panel negative CT scan shows pancolitis versus possible hypoalbuminemia causing wall thickening. Requirement of 14 day treatment course for antibiotics is not helping his diarrhea as well. GI has been consulted. Now signed off. On probiotic, scheduled imodium. monitor response.  2. 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 Pathology report shows clean margins. Discuss with infectious disease, recommended to continue 14 days of antibiotics. Patient was on doxycycline and IV Zosyn. Cultures so far negative. change to IV doxycycline and IV Unasyn while in the hospital. Probiotics added. Last day of antibiotic 11/07/2016. Wound vac has been removed.  Orthopedic has been concerned with wound and feels he may need some debridement and reapplication with wound vac. Will follow Recommendation.  3. Cirrhosis of the liver. Portal hypertensive gastropathy, esophageal varices. Duodenal ulcers hematemesis as well as melena. Acute blood loss anemia GI consulted, appreciate input. Transfuse for hemoglobin less  than 7. Nadolol dose has been reduced from 40 mg with 20 mg due to hypotension and acute kidney injury. Hypoalbuminemia, low fibrinogen level, elevated INR and chronic thrombocytopenia all secondary to cirrhosis. S/P paracentesis 10/24/2016 with 3 L of fluid removed. No evidence of SBP. S/P EGD; on protonix, IV diflucan, gastroenterology initiated on vitamin K. Large volume ascites still present, may need taping next week. No indication to perform paracentesis at present. Continue 25 mg Aldactone.  4. chronic thrombocytopenia Platelet has been low since last one year. Likely in the setting of liver disease. Elevated INR and low fibrinogen due to liver disease as well. I highly doubt presence of DIC TTP at present. 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 Peripheral smear shows leukocytes without any schistocytes. Stop aspirin and use mechanical prophylaxis for DVT. Platelet remains low after transfusion, due to cirrhosis or sepsis associated ITP. Monitor, if he needs surgery, will follow surgery Recommendation regarding platelet. Discussed with hematology who recommends if he needs higher platelets for his surgery transfuse as needed.  5. Acute kidney injury. Seems to improve after blood transfusion. Will monitor with daily weight and strict IN and OUT. Baseline renal function 1.3, on presentation 2.49, now stable, probably multifactorial Due to hypotension and dehydration from diarrhea. Unclear whether surgery as well as paracentesis has any role to play in persistence of renal dysfunction. US renal negative. Reduce the dose of nadolol. Given IV albumin. Currently stopped. Resuming only Aldactone.  6. Type 2 diabetes mellitus. The patient remains on clear liquid diet and will continue every 4 hours CBG with sliding scale. Monitor. A1c 5.8 on admission.  Bowel regimen: last BM 11/02/2016 Diet: Carb modified DVT Prophylaxis: mechanical compression device  Advance  goals of care discussion: I discussed with patient, his prognosis is significantly guarded in the setting of  GI bleed, acute kidney injury, wound infection. Persistent nausea vomiting and diarrhea is also making things worse. Pt is also malnourished, which also render's poor prognosis. Palliative care also discussed with the patient, code status has been changed from full code to DNR/DNI.  Patient asked me today that if my prognosis is limited is there meaning in continuing what we're doing? I recommended the rehabilitation opportunity to get him out of the hospital this admission but recommend him to consider  transition to hospice at the time of the discharge. We'll monitor.  Family Communication: no family was present at bedside, at the time of interview.   Disposition:  Discharge to SNF. Expected discharge date: 11/06/2016, stabilization of H&H, renal function  Consultants: Gastroenterology, orthopedics, palliative care Procedures: First ray amputation of the left leg.  Antibiotics: Anti-infectives    Start     Dose/Rate Route Frequency Ordered Stop   11/01/16 1800  fluconazole (DIFLUCAN) IVPB 100 mg     100 mg 50 mL/hr over 60 Minutes Intravenous Every 24 hours 11/01/16 1142 11/08/16 1759   10/31/16 1800  doxycycline (VIBRAMYCIN) 100 mg in dextrose 5 % 250 mL IVPB     100 mg 125 mL/hr over 120 Minutes Intravenous Every 12 hours 10/31/16 1710     10/31/16 1800  fluconazole (DIFLUCAN) IVPB 100 mg  Status:  Discontinued     100 mg 50 mL/hr over 60 Minutes Intravenous Every 24 hours 10/31/16 1710 11/01/16 1142   10/31/16 1000  fluconazole (DIFLUCAN) tablet 100 mg  Status:  Discontinued     100 mg Oral Daily 10/30/16 1239 10/31/16 1709   10/30/16 2200  doxycycline (VIBRA-TABS) tablet 100 mg  Status:  Discontinued     100 mg Oral Every 12 hours 10/30/16 1238 10/31/16 1709   10/30/16 1400  Ampicillin-Sulbactam (UNASYN) 3 g in sodium chloride 0.9 % 100 mL IVPB     3 g 200 mL/hr over 30  Minutes Intravenous Every 6 hours 10/30/16 1333 11/06/16 1359   10/30/16 0800  fluconazole (DIFLUCAN) IVPB 100 mg  Status:  Discontinued     100 mg 50 mL/hr over 60 Minutes Intravenous Every 24 hours 10/29/16 0826 10/30/16 1238   10/29/16 0800  fluconazole (DIFLUCAN) IVPB 200 mg     200 mg 100 mL/hr over 60 Minutes Intravenous  Once 10/29/16 0739 10/29/16 0914   10/24/16 0600  piperacillin-tazobactam (ZOSYN) IVPB 3.375 g  Status:  Discontinued    Comments:  Zosyn 3.375 g IV q8h for CrCl > 10 mL/min   3.375 g 12.5 mL/hr over 240 Minutes Intravenous Every 8 hours 10/24/16 0130 10/30/16 1231   10/23/16 2330  doxycycline (VIBRAMYCIN) 100 mg in dextrose 5 % 250 mL IVPB  Status:  Discontinued     100 mg 125 mL/hr over 120 Minutes Intravenous Every 12 hours 10/23/16 2322 10/30/16 1231   10/23/16 2330  piperacillin-tazobactam (ZOSYN) IVPB 3.375 g  Status:  Discontinued     3.375 g 12.5 mL/hr over 240 Minutes Intravenous Every 8 hours 10/23/16 2323 10/24/16 0130      Objective: Physical Exam: Vitals:   11/04/16 2100 11/05/16 0452 11/05/16 1000 11/05/16 1342  BP: 116/64 (!) 104/59 108/66 107/60  Pulse: 63 61 62 (!) 56  Resp: 17 17 20 20   Temp: 98.5 F (36.9 C) 97.7 F (36.5 C)  98.2 F (36.8 C)  TempSrc: Oral Oral  Oral  SpO2: 98% 100% 100% 100%  Weight:      Height:  Intake/Output Summary (Last 24 hours) at 11/05/16 1552 Last data filed at 11/05/16 1344  Gross per 24 hour  Intake             2030 ml  Output             1000 ml  Net             1030 ml   Filed Weights   11/02/16 0800 11/03/16 0500 11/04/16 0547  Weight: 108.9 kg (240 lb 1.3 oz) 109.4 kg (241 lb 2.9 oz) 111.7 kg (246 lb 4.1 oz)    General: Alert, Awake and Oriented to Time, Place and Person. Appear in mild distress, affect appropriate Eyes: PERRL, Conjunctiva normal ENT: Oral Mucosa clear moist. Neck: difficult to assess JVD, no Abnormal Mass Or lumps Cardiovascular: S1 and S2 Present, no  Murmur, Respiratory: Bilateral Air entry equal and Decreased, no use of accessory muscle, Clear to Auscultation, no Crackles, no wheezes Abdomen: Bowel Sound present, Soft and mild tenderness Skin: left buttock bruise from prior fall, no redness, no Rash, no induration Extremities: bilateral Pedal edema, no calf tenderness Neurologic: Grossly no focal neuro deficit. Bilaterally Equal motor strength  Data Reviewed: CBC:  Recent Labs Lab 10/30/16 0708  11/02/16 1925 11/03/16 0531 11/04/16 0535 11/04/16 2022 11/05/16 0404  WBC  --   < > 6.0 5.2 5.1 6.7 5.6  NEUTROABS 6.2  --   --   --   --   --   --   HGB  --   < > 7.2* 7.3* 7.2* 8.1* 8.5*  HCT  --   < > 20.9* 21.3* 21.4* 24.3* 24.9*  MCV  --   < > 93.7 94.2 96.0 94.9 95.4  PLT  --   < > 52* 48* 46* 54* 48*  < > = values in this interval not displayed. Basic Metabolic Panel:  Recent Labs Lab 10/30/16 0315 10/31/16 0341 11/01/16 0402 11/02/16 0303 11/03/16 0531 11/04/16 0535 11/05/16 0404  NA 133* 135 136 137 140 137 134*  K 4.6 4.5 4.5 4.0 4.1 3.7 3.6  CL 107 107 107 114* 113* 111 109  CO2 19* 18* 18* 19* 21* 20* 18*  GLUCOSE 139* 118* 150* 144* 123* 129* 154*  BUN 26* 35* 45* 52* 46* 42* 38*  CREATININE 1.91* 2.50* 2.45* 2.29* 2.15* 1.92* 1.71*  CALCIUM 8.1* 8.4* 8.6* 8.7* 8.9 8.7* 8.3*  MG 2.0 2.0  --   --   --   --   --   PHOS  --   --  3.7 3.2 3.1 3.2 3.3    Liver Function Tests:  Recent Labs Lab 11/01/16 0402 11/02/16 0303 11/03/16 0531 11/04/16 0535 11/05/16 0404  ALBUMIN 2.5* 2.2* 2.0* 2.0* 1.8*   No results for input(s): LIPASE, AMYLASE in the last 168 hours. No results for input(s): AMMONIA in the last 168 hours. Coagulation Profile:  Recent Labs Lab 11/02/16 0303 11/02/16 1925 11/03/16 0531 11/04/16 0535 11/05/16 0404  INR 3.41 3.11 2.99 2.60 2.60   Cardiac Enzymes: No results for input(s): CKTOTAL, CKMB, CKMBINDEX, TROPONINI in the last 168 hours. BNP (last 3 results) No results for  input(s): PROBNP in the last 8760 hours.  CBG:  Recent Labs Lab 11/04/16 1159 11/04/16 1629 11/04/16 2110 11/05/16 0644 11/05/16 1051  GLUCAP 148* 167* 168* 150* 137*    Studies: No results found.   Scheduled Meds: . ampicillin-sulbactam (UNASYN) IV  3 g Intravenous Q6H  . brimonidine  1 drop Both Eyes  Daily  . doxycycline (VIBRAMYCIN) IV  100 mg Intravenous Q12H  . feeding supplement (GLUCERNA SHAKE)  237 mL Oral TID BM  . feeding supplement (PRO-STAT SUGAR FREE 64)  30 mL Oral TID  . fluconazole (DIFLUCAN) IV  100 mg Intravenous Q24H  . insulin aspart  0-9 Units Subcutaneous TID WC & HS  . loperamide  2 mg Oral TID  . metoCLOPramide (REGLAN) injection  5 mg Intravenous Q6H  . nadolol  20 mg Oral Daily  . nystatin   Topical BID  . pantoprazole  40 mg Oral BID  . PARoxetine  20 mg Oral Daily  . saccharomyces boulardii  250 mg Oral BID  . spironolactone  25 mg Oral Daily  . sucralfate  1 g Oral TID WC & HS   Continuous Infusions: PRN Meds: acetaminophen **OR** acetaminophen, LORazepam, methocarbamol, morphine injection, naLOXone (NARCAN)  injection, ondansetron, promethazine, traZODone  Time spent: 30 minutes  Author: Lynden OxfordPranav Lennix Kneisel, MD [facility] Pager: [phone] 11/05/2016 3:52 PM  If 7PM-7AM, please contact night-coverage at [email or web address], password [email or web address]

## 2016-11-05 NOTE — Progress Notes (Signed)
Patient's left foot is evaluated today.  He has some areas in the midportion of the incision from the first ray amputation which have not healed together.  This delayed healing may preclude discharge tomorrow.  Dr. Ophelia CharterYates will be back in town to evaluate.  Tomorrow.  This may need revision surgery with wound VAC placement.  The other toes appear perfused.  He is on antibiotics currently.

## 2016-11-06 ENCOUNTER — Inpatient Hospital Stay (HOSPITAL_COMMUNITY): Payer: BC Managed Care – PPO | Admitting: Anesthesiology

## 2016-11-06 ENCOUNTER — Encounter (HOSPITAL_COMMUNITY)
Admission: EM | Disposition: A | Discharge status: Skilled Nursing Facility | Payer: Self-pay | Source: Home / Self Care | Attending: Internal Medicine

## 2016-11-06 ENCOUNTER — Encounter (HOSPITAL_COMMUNITY): Payer: Self-pay | Admitting: Anesthesiology

## 2016-11-06 HISTORY — PX: I&D EXTREMITY: SHX5045

## 2016-11-06 HISTORY — PX: APPLICATION OF WOUND VAC: SHX5189

## 2016-11-06 LAB — CBC
HEMATOCRIT: 26.9 % — AB (ref 39.0–52.0)
HEMOGLOBIN: 9 g/dL — AB (ref 13.0–17.0)
MCH: 32.1 pg (ref 26.0–34.0)
MCHC: 33.5 g/dL (ref 30.0–36.0)
MCV: 96.1 fL (ref 78.0–100.0)
Platelets: 51 10*3/uL — ABNORMAL LOW (ref 150–400)
RBC: 2.8 MIL/uL — AB (ref 4.22–5.81)
RDW: 18.8 % — ABNORMAL HIGH (ref 11.5–15.5)
WBC: 5.5 10*3/uL (ref 4.0–10.5)

## 2016-11-06 LAB — RENAL FUNCTION PANEL
ALBUMIN: 1.6 g/dL — AB (ref 3.5–5.0)
ALBUMIN: 2.1 g/dL — AB (ref 3.5–5.0)
ANION GAP: 7 (ref 5–15)
ANION GAP: 11 (ref 5–15)
BUN: 36 mg/dL — ABNORMAL HIGH (ref 6–20)
BUN: 36 mg/dL — ABNORMAL HIGH (ref 6–20)
CALCIUM: 8.3 mg/dL — AB (ref 8.9–10.3)
CALCIUM: 8.5 mg/dL — AB (ref 8.9–10.3)
CO2: 20 mmol/L — ABNORMAL LOW (ref 22–32)
CO2: 17 mmol/L — ABNORMAL LOW (ref 22–32)
CREATININE: 1.75 mg/dL — AB (ref 0.61–1.24)
Chloride: 110 mmol/L (ref 101–111)
Chloride: 107 mmol/L (ref 101–111)
Creatinine, Ser: 1.67 mg/dL — ABNORMAL HIGH (ref 0.61–1.24)
GFR calc non Af Amer: 39 mL/min — ABNORMAL LOW (ref >60)
GFR calc non Af Amer: 41 mL/min — ABNORMAL LOW (ref >60)
GFR, EST AFRICAN AMERICAN: 45 mL/min — AB (ref >60)
GFR, EST AFRICAN AMERICAN: 48 mL/min — AB (ref >60)
GLUCOSE: 153 mg/dL — AB (ref 65–99)
Glucose, Bld: 104 mg/dL — ABNORMAL HIGH (ref 65–99)
PHOSPHORUS: 3.4 mg/dL (ref 2.5–4.6)
PHOSPHORUS: 3.2 mg/dL (ref 2.5–4.6)
POTASSIUM: 3.7 mmol/L (ref 3.5–5.1)
Potassium: 4.1 mmol/L (ref 3.5–5.1)
SODIUM: 137 mmol/L (ref 135–145)
SODIUM: 135 mmol/L (ref 135–145)

## 2016-11-06 LAB — GLUCOSE, CAPILLARY
GLUCOSE-CAPILLARY: 103 mg/dL — AB (ref 65–99)
GLUCOSE-CAPILLARY: 100 mg/dL — AB (ref 65–99)
GLUCOSE-CAPILLARY: 102 mg/dL — AB (ref 65–99)
GLUCOSE-CAPILLARY: 122 mg/dL — AB (ref 65–99)
Glucose-Capillary: 133 mg/dL — ABNORMAL HIGH (ref 65–99)

## 2016-11-06 LAB — PROTIME-INR
INR: 2.52
Prothrombin Time: 27.6 seconds — ABNORMAL HIGH (ref 11.4–15.2)

## 2016-11-06 SURGERY — IRRIGATION AND DEBRIDEMENT EXTREMITY
Anesthesia: General | Laterality: Left

## 2016-11-06 MED ORDER — METOCLOPRAMIDE HCL 5 MG/ML IJ SOLN
5.0000 mg | Freq: Three times a day (TID) | INTRAMUSCULAR | Status: DC | PRN
  Administered 2016-11-07: 10 mg via INTRAVENOUS

## 2016-11-06 MED ORDER — METOCLOPRAMIDE HCL 5 MG PO TABS: 5.0000 mg | ORAL_TABLET | Freq: Three times a day (TID) | ORAL | Status: DC | PRN

## 2016-11-06 MED ORDER — ONDANSETRON HCL 4 MG PO TABS: 4.0000 mg | ORAL_TABLET | Freq: Four times a day (QID) | ORAL | Status: DC | PRN

## 2016-11-06 MED ORDER — PROPOFOL 10 MG/ML IV BOLUS
INTRAVENOUS | Status: DC | PRN
  Administered 2016-11-06: 100 mg via INTRAVENOUS

## 2016-11-06 MED ORDER — ACETAMINOPHEN 325 MG PO TABS: 650.0000 mg | ORAL_TABLET | Freq: Four times a day (QID) | ORAL | Status: DC | PRN

## 2016-11-06 MED ORDER — SODIUM CHLORIDE 0.9 % IR SOLN: Status: DC | PRN

## 2016-11-06 MED ORDER — PHENYLEPHRINE HCL 10 MG/ML IJ SOLN
INTRAVENOUS | Status: DC | PRN
  Administered 2016-11-06: 50 ug/min via INTRAVENOUS

## 2016-11-06 MED ORDER — FENTANYL CITRATE (PF) 100 MCG/2ML IJ SOLN
INTRAMUSCULAR | Status: DC | PRN
  Administered 2016-11-06 (×2): 50 ug via INTRAVENOUS

## 2016-11-06 MED ORDER — ONDANSETRON HCL 4 MG/2ML IJ SOLN: 4.0000 mg | Freq: Four times a day (QID) | INTRAMUSCULAR | Status: DC | PRN

## 2016-11-06 MED ORDER — CEFAZOLIN IN D5W 1 GM/50ML IV SOLN
1.0000 g | Freq: Three times a day (TID) | INTRAVENOUS | Status: AC
  Administered 2016-11-06 – 2016-11-08 (×6): 1 g via INTRAVENOUS
  Filled 2016-11-06 (×6): qty 50

## 2016-11-06 MED ORDER — INFLUENZA VAC SPLIT QUAD 0.5 ML IM SUSY: 0.5000 mL | PREFILLED_SYRINGE | INTRAMUSCULAR | Status: DC | PRN

## 2016-11-06 MED ORDER — 0.9 % SODIUM CHLORIDE (POUR BTL) OPTIME
TOPICAL | Status: DC | PRN
  Administered 2016-11-06: 1000 mL

## 2016-11-06 MED ORDER — FUROSEMIDE 10 MG/ML IJ SOLN
40.0000 mg | Freq: Once | INTRAMUSCULAR | Status: AC
  Administered 2016-11-06: 40 mg via INTRAVENOUS
  Filled 2016-11-06: qty 4

## 2016-11-06 MED ORDER — FENTANYL CITRATE (PF) 100 MCG/2ML IJ SOLN
INTRAMUSCULAR | Status: AC
  Filled 2016-11-06: qty 2

## 2016-11-06 MED ORDER — SODIUM CHLORIDE 0.9 % IV SOLN
INTRAVENOUS | Status: DC | PRN
  Administered 2016-11-06: 15:00:00 via INTRAVENOUS

## 2016-11-06 MED ORDER — PNEUMOCOCCAL VAC POLYVALENT 25 MCG/0.5ML IJ INJ: 0.5000 mL | INJECTION | INTRAMUSCULAR | Status: DC | PRN

## 2016-11-06 MED ORDER — MIDAZOLAM HCL 5 MG/5ML IJ SOLN
INTRAMUSCULAR | Status: DC | PRN
  Administered 2016-11-06 (×2): 1 mg via INTRAVENOUS

## 2016-11-06 MED ORDER — PROPOFOL 10 MG/ML IV BOLUS
INTRAVENOUS | Status: AC
  Filled 2016-11-06: qty 20

## 2016-11-06 MED ORDER — ACETAMINOPHEN 650 MG RE SUPP: 650.0000 mg | Freq: Four times a day (QID) | RECTAL | Status: DC | PRN

## 2016-11-06 MED ORDER — ALBUMIN HUMAN 25 % IV SOLN
25.0000 g | Freq: Once | INTRAVENOUS | Status: AC
  Administered 2016-11-06: 25 g via INTRAVENOUS
  Filled 2016-11-06: qty 100

## 2016-11-06 MED ORDER — MIDAZOLAM HCL 2 MG/2ML IJ SOLN
INTRAMUSCULAR | Status: AC
  Filled 2016-11-06: qty 2

## 2016-11-06 MED ORDER — LIDOCAINE HCL (CARDIAC) 20 MG/ML IV SOLN
INTRAVENOUS | Status: DC | PRN
  Administered 2016-11-06: 60 mg via INTRAVENOUS

## 2016-11-06 SURGICAL SUPPLY — 43 items
BAG DECANTER FOR FLEXI CONT (MISCELLANEOUS) IMPLANT
BANDAGE ACE 4X5 VEL STRL LF (GAUZE/BANDAGES/DRESSINGS) ×3 IMPLANT
BNDG COHESIVE 4X5 TAN STRL (GAUZE/BANDAGES/DRESSINGS) IMPLANT
BNDG GAUZE ELAST 4 BULKY (GAUZE/BANDAGES/DRESSINGS) ×3 IMPLANT
CANISTER WOUND CARE 500ML ATS (WOUND CARE) ×3 IMPLANT
COVER SURGICAL LIGHT HANDLE (MISCELLANEOUS) ×3 IMPLANT
CUFF TOURNIQUET SINGLE 18IN (TOURNIQUET CUFF) ×3 IMPLANT
DRSG EMULSION OIL 3X3 NADH (GAUZE/BANDAGES/DRESSINGS) IMPLANT
DRSG PAD ABDOMINAL 8X10 ST (GAUZE/BANDAGES/DRESSINGS) IMPLANT
DRSG VAC ATS LRG SENSATRAC (GAUZE/BANDAGES/DRESSINGS) ×3 IMPLANT
DRSG VAC ATS SM SENSATRAC (GAUZE/BANDAGES/DRESSINGS) ×3 IMPLANT
ELECT REM PT RETURN 9FT ADLT (ELECTROSURGICAL) ×3
ELECTRODE REM PT RTRN 9FT ADLT (ELECTROSURGICAL) ×1 IMPLANT
GAUZE SPONGE 4X4 12PLY STRL (GAUZE/BANDAGES/DRESSINGS) ×3 IMPLANT
GAUZE XEROFORM 5X9 LF (GAUZE/BANDAGES/DRESSINGS) IMPLANT
GLOVE BIOGEL PI IND STRL 8 (GLOVE) ×1 IMPLANT
GLOVE BIOGEL PI INDICATOR 8 (GLOVE) ×2
GLOVE ORTHO TXT STRL SZ7.5 (GLOVE) ×3 IMPLANT
GOWN STRL REUS W/ TWL LRG LVL3 (GOWN DISPOSABLE) ×1 IMPLANT
GOWN STRL REUS W/ TWL XL LVL3 (GOWN DISPOSABLE) ×1 IMPLANT
GOWN STRL REUS W/TWL 2XL LVL3 (GOWN DISPOSABLE) IMPLANT
GOWN STRL REUS W/TWL LRG LVL3 (GOWN DISPOSABLE) ×2
GOWN STRL REUS W/TWL XL LVL3 (GOWN DISPOSABLE) ×2
HANDPIECE INTERPULSE COAX TIP (DISPOSABLE)
KIT BASIN OR (CUSTOM PROCEDURE TRAY) ×3 IMPLANT
KIT ROOM TURNOVER OR (KITS) ×3 IMPLANT
MANIFOLD NEPTUNE II (INSTRUMENTS) ×3 IMPLANT
NS IRRIG 1000ML POUR BTL (IV SOLUTION) ×3 IMPLANT
PACK ORTHO EXTREMITY (CUSTOM PROCEDURE TRAY) ×3 IMPLANT
PAD ARMBOARD 7.5X6 YLW CONV (MISCELLANEOUS) ×6 IMPLANT
SET HNDPC FAN SPRY TIP SCT (DISPOSABLE) IMPLANT
SPONGE LAP 18X18 X RAY DECT (DISPOSABLE) IMPLANT
SPONGE LAP 4X18 X RAY DECT (DISPOSABLE) IMPLANT
STOCKINETTE IMPERVIOUS 9X36 MD (GAUZE/BANDAGES/DRESSINGS) ×3 IMPLANT
SUT ETHILON 4 0 PS 2 18 (SUTURE) ×3 IMPLANT
TOWEL OR 17X24 6PK STRL BLUE (TOWEL DISPOSABLE) ×3 IMPLANT
TOWEL OR 17X26 10 PK STRL BLUE (TOWEL DISPOSABLE) ×3 IMPLANT
TUBE ANAEROBIC SPECIMEN COL (MISCELLANEOUS) IMPLANT
TUBE CONNECTING 12'X1/4 (SUCTIONS) ×1
TUBE CONNECTING 12X1/4 (SUCTIONS) ×2 IMPLANT
UNDERPAD 30X30 (UNDERPADS AND DIAPERS) ×3 IMPLANT
WATER STERILE IRR 1000ML POUR (IV SOLUTION) IMPLANT
YANKAUER SUCT BULB TIP NO VENT (SUCTIONS) ×3 IMPLANT

## 2016-11-06 NOTE — Progress Notes (Signed)
Pt transferred to OR/SS w/ albumin infusing. [facility]

## 2016-11-06 NOTE — Transfer of Care (Signed)
Immediate Anesthesia Transfer of Care Note  Patient: Jeffery [name]  Procedure(s) Performed: Procedure(s): IRRIGATION AND DEBRIDEMENT EXTREMITY/LEFT FOOT (Left) APPLICATION OF WOUND VAC (Left)  Patient Location: PACU  Anesthesia Type:General  Level of Consciousness: awake, alert , sedated and patient cooperative  Airway & Oxygen Therapy: Patient Spontanous Breathing and Patient connected to face mask oxygen  Post-op Assessment: Report given to RN, Post -op Vital signs reviewed and stable, Patient moving all extremities and Patient moving all extremities X 4  Post vital signs: Reviewed and stable  Last Vitals:  Vitals:   11/06/16 0500 11/06/16 1603  BP: (!) 102/47 (!) 89/47  Pulse: (!) 58 (!) 56  Resp: 18 13  Temp: 36.6 C 36.3 C    Last Pain:  Vitals:   11/06/16 0730  TempSrc:   PainSc: 0-No pain      Patients Stated Pain Goal: 3 (11/02/16 2025)  Complications: No apparent anesthesia complications

## 2016-11-06 NOTE — Progress Notes (Signed)
RN notified Hospitalists of patients atrial fib rhythm. Patient currently sinus brady. Nursing will continue to monitor.

## 2016-11-06 NOTE — H&P (View-Only) (Signed)
Patient ID: Jeffery [name], male   DOB: 08-06-1951, 66 y.o.   MRN: [id number] Due to wound partial dehiscence with bleeding will proceed with left foot debridement and VAC placement to assist in healing since he cannot wear prosthesis on his other leg and lived independently using W/C and was FWB on his left foot for transfers before admission. If BKA done then he would have significant decrease in mobility and independence . Discussed with hospitalist plan for care and also patient by phone and I will see him later today before surgery. He agrees to proceed. Order for consent done.

## 2016-11-06 NOTE — Progress Notes (Signed)
Reviewed overnight rhythm of patient with central telemetry. Appears patient converted to afib at approximately 1930 and converted to sb w/ bbb at 0230.  MD was not notified until 0730. 12-lead ordered at that time.

## 2016-11-06 NOTE — Anesthesia Procedure Notes (Signed)
Procedure Name: LMA Insertion Date/Time: 11/06/2016 3:31 PM Performed by: Coralee RudFLORES, Jemarion Roycroft Pre-anesthesia Checklist: Patient identified, Emergency Drugs available, Suction available and Patient being monitored Patient Re-evaluated:Patient Re-evaluated prior to inductionOxygen Delivery Method: Circle system utilized Preoxygenation: Pre-oxygenation with 100% oxygen Intubation Type: IV induction Ventilation: Mask ventilation without difficulty LMA: LMA inserted LMA Size: 5.0 Tube secured with: Tape Dental Injury: Teeth and Oropharynx as per pre-operative assessment

## 2016-11-06 NOTE — Progress Notes (Signed)
[facility] Progress Note  Patient: Jeffery [name] GEX:[id number]   PCP: Julian Hy, MD DOB: 02/11/1951   DOA: 10/23/2016   DOS: 11/06/2016   Date of Service: the patient was seen and examined on 11/06/2016   Subjective: Had bleeding yesterday from the wound, currently resolved. Complains about swelling of his scrotum  Brief hospital course: Pt. with PMH of cirrhosis with ascites, portal hypertension with gastropathy and esophageal varices, hemolytic anemia, chronic thrombocytopenia, right AKA; admitted on 10/23/2016, with complaint of diarrhea, was found to have suspected viral gastroenteritis, GI bleed, left toe osteomyelitis and acute kidney injury. Initially admitted in the step down unit and now transferred to MedSurg floor since 10/30/2016. We will undergo wound VAC placement today Currently further plan is follow monitor postoperative course today  Assessment and Plan: 1. Persistent diarrhea, resolved. C. difficile negative. FOBT positive. GI pathogen panel negative CT scan shows pancolitis versus possible hypoalbuminemia causing wall thickening. Requirement of 14 day treatment course for antibiotics is not helping his diarrhea as well. GI has been consulted. Now signed off. On probiotic, scheduled imodium. monitor response.  2. 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 Pathology report shows clean margins. Discuss with infectious disease, recommended to continue 14 days of antibiotics. Patient was on doxycycline and IV Zosyn. Cultures so far negative. change to IV doxycycline and IV Unasyn while in the hospital. Probiotics added. Last day of antibiotic 11/07/2016. Wound vac has been removed.  I discussed with Dr. Ophelia Charter today, patient will be undergoing washout of the wound and wound VAC placement.  3. Cirrhosis of the liver. Portal hypertensive gastropathy, esophageal varices. Duodenal ulcers hematemesis as well as melena. Acute blood loss anemia GI  consulted, appreciate input. Transfuse for hemoglobin less than 7. Nadolol dose has been reduced from 40 mg with 20 mg due to hypotension and acute kidney injury. Hypoalbuminemia, low fibrinogen level, elevated INR and chronic thrombocytopenia all secondary to cirrhosis. S/P paracentesis 10/24/2016 with 3 L of fluid removed. No evidence of SBP. S/P EGD; on protonix, IV diflucan, gastroenterology initiated on vitamin K. Large volume ascites still present, may need taping next week. No indication to perform paracentesis at present. Continue 25 mg Aldactone. Give albumin and Aldactone.  4. chronic thrombocytopenia Platelet has been low since last one year. Likely in the setting of liver disease. Elevated INR and low fibrinogen due to liver disease as well. I highly doubt presence of DIC TTP at present. 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 Peripheral smear shows leukocytes without any schistocytes. Stop aspirin and use mechanical prophylaxis for DVT. Platelet remains low after transfusion, due to cirrhosis or sepsis associated ITP. Monitor, if he needs surgery, will follow surgery Recommendation regarding platelet. Discussed with hematology who recommends if he needs higher platelets for his surgery transfuse as needed.  5. Acute kidney injury. Seems to improve after blood transfusion. Will monitor with daily weight and strict IN and OUT. Baseline renal function 1.3, on presentation 2.49, now stable, probably multifactorial Due to hypotension and dehydration from diarrhea. Unclear whether surgery as well as paracentesis has any role to play in persistence of renal dysfunction. US renal negative. Reduce the dose of nadolol. Given IV albumin. Currently stopped. Resuming only Aldactone.  6. Type 2 diabetes mellitus. The patient remains on clear liquid diet and will continue every 4 hours CBG with sliding scale. Monitor. A1c 5.8 on admission.  Bowel regimen: last BM  11/06/2016 Diet: Carb modified DVT Prophylaxis: mechanical compression device  Advance goals of  care discussion: I discussed with patient, his prognosis is significantly guarded in the setting of GI bleed, acute kidney injury, wound infection. Persistent nausea vomiting and diarrhea is also making things worse. Pt is also malnourished, which also render's poor prognosis. Palliative care also discussed with the patient, code status has been changed from full code to DNR/DNI.  Patient asked me that if my prognosis is limited is there meaning in continuing what we're doing? I recommended the rehabilitation opportunity to get him out of the hospital this admission but recommend him to consider  transition to hospice at the time of the discharge. We'll monitor.  Family Communication: no family was present at bedside, at the time of interview.   Disposition:  Discharge to SNF. Expected discharge date: 11/07/2016, stabilization of H&H, renal function  Consultants: Gastroenterology, orthopedics, palliative care Procedures: First ray amputation of the left leg.  Antibiotics: Anti-infectives    Start     Dose/Rate Route Frequency Ordered Stop   11/01/16 1800  [MAR Hold]  fluconazole (DIFLUCAN) IVPB 100 mg     (MAR Hold since 11/06/16 1457)   100 mg 50 mL/hr over 60 Minutes Intravenous Every 24 hours 11/01/16 1142 11/08/16 1759   10/31/16 1800  [MAR Hold]  doxycycline (VIBRAMYCIN) 100 mg in dextrose 5 % 250 mL IVPB     (MAR Hold since 11/06/16 1457)   100 mg 125 mL/hr over 120 Minutes Intravenous Every 12 hours 10/31/16 1710     10/31/16 1800  fluconazole (DIFLUCAN) IVPB 100 mg  Status:  Discontinued     100 mg 50 mL/hr over 60 Minutes Intravenous Every 24 hours 10/31/16 1710 11/01/16 1142   10/31/16 1000  fluconazole (DIFLUCAN) tablet 100 mg  Status:  Discontinued     100 mg Oral Daily 10/30/16 1239 10/31/16 1709   10/30/16 2200  doxycycline (VIBRA-TABS) tablet 100 mg  Status:  Discontinued      100 mg Oral Every 12 hours 10/30/16 1238 10/31/16 1709   10/30/16 1400  Ampicillin-Sulbactam (UNASYN) 3 g in sodium chloride 0.9 % 100 mL IVPB     3 g 200 mL/hr over 30 Minutes Intravenous Every 6 hours 10/30/16 1333 11/06/16 1359   10/30/16 0800  fluconazole (DIFLUCAN) IVPB 100 mg  Status:  Discontinued     100 mg 50 mL/hr over 60 Minutes Intravenous Every 24 hours 10/29/16 0826 10/30/16 1238   10/29/16 0800  fluconazole (DIFLUCAN) IVPB 200 mg     200 mg 100 mL/hr over 60 Minutes Intravenous  Once 10/29/16 0739 10/29/16 0914   10/24/16 0600  piperacillin-tazobactam (ZOSYN) IVPB 3.375 g  Status:  Discontinued    Comments:  Zosyn 3.375 g IV q8h for CrCl > 10 mL/min   3.375 g 12.5 mL/hr over 240 Minutes Intravenous Every 8 hours 10/24/16 0130 10/30/16 1231   10/23/16 2330  doxycycline (VIBRAMYCIN) 100 mg in dextrose 5 % 250 mL IVPB  Status:  Discontinued     100 mg 125 mL/hr over 120 Minutes Intravenous Every 12 hours 10/23/16 2322 10/30/16 1231   10/23/16 2330  piperacillin-tazobactam (ZOSYN) IVPB 3.375 g  Status:  Discontinued     3.375 g 12.5 mL/hr over 240 Minutes Intravenous Every 8 hours 10/23/16 2323 10/24/16 0130      Objective: Physical Exam: Vitals:   11/05/16 1000 11/05/16 1342 11/05/16 2139 11/06/16 0500  BP: 108/66 107/60 (!) 91/51 (!) 102/47  Pulse: 62 (!) 56 82 (!) 58  Resp: 20 20 18 18   Temp:  98.2 F (  36.8 C) 98.1 F (36.7 C) 97.8 F (36.6 C)  TempSrc:  Oral Oral Oral  SpO2: 100% 100% 98% 99%  Weight:    114.8 kg (253 lb)  Height:        Intake/Output Summary (Last 24 hours) at 11/06/16 1556 Last data filed at 11/06/16 1547  Gross per 24 hour  Intake             1740 ml  Output              900 ml  Net              840 ml   Filed Weights   11/03/16 0500 11/04/16 0547 11/06/16 0500  Weight: 109.4 kg (241 lb 2.9 oz) 111.7 kg (246 lb 4.1 oz) 114.8 kg (253 lb)    General: Alert, Awake and Oriented to Time, Place and Person. Appear in mild distress, affect  appropriate Eyes: PERRL, Conjunctiva normal ENT: Oral Mucosa clear moist. Neck: difficult to assess JVD, no Abnormal Mass Or lumps Cardiovascular: S1 and S2 Present, no Murmur, Respiratory: Bilateral Air entry equal and Decreased, no use of accessory muscle, Clear to Auscultation, no Crackles, no wheezes Abdomen: Bowel Sound present, Soft and mild tenderness Skin: left buttock bruise from prior fall, no redness, no Rash, no induration Extremities: bilateral Pedal edema, no calf tenderness Neurologic: Grossly no focal neuro deficit. Bilaterally Equal motor strength  Data Reviewed: CBC:  Recent Labs Lab 11/02/16 1925 11/03/16 0531 11/04/16 0535 11/04/16 2022 11/05/16 0404  WBC 6.0 5.2 5.1 6.7 5.6  HGB 7.2* 7.3* 7.2* 8.1* 8.5*  HCT 20.9* 21.3* 21.4* 24.3* 24.9*  MCV 93.7 94.2 96.0 94.9 95.4  PLT 52* 48* 46* 54* 48*   Basic Metabolic Panel:  Recent Labs Lab 10/31/16 0341  11/02/16 0303 11/03/16 0531 11/04/16 0535 11/05/16 0404 11/06/16 0320  NA 135  < > 137 140 137 134* 137  K 4.5  < > 4.0 4.1 3.7 3.6 4.1  CL 107  < > 114* 113* 111 109 110  CO2 18*  < > 19* 21* 20* 18* 20*  GLUCOSE 118*  < > 144* 123* 129* 154* 153*  BUN 35*  < > 52* 46* 42* 38* 36*  CREATININE 2.50*  < > 2.29* 2.15* 1.92* 1.71* 1.75*  CALCIUM 8.4*  < > 8.7* 8.9 8.7* 8.3* 8.3*  MG 2.0  --   --   --   --   --   --   PHOS  --   < > 3.2 3.1 3.2 3.3 3.4  < > = values in this interval not displayed.  Liver Function Tests:  Recent Labs Lab 11/02/16 0303 11/03/16 0531 11/04/16 0535 11/05/16 0404 11/06/16 0320  ALBUMIN 2.2* 2.0* 2.0* 1.8* 1.6*   No results for input(s): LIPASE, AMYLASE in the last 168 hours. No results for input(s): AMMONIA in the last 168 hours. Coagulation Profile:  Recent Labs Lab 11/02/16 1925 11/03/16 0531 11/04/16 0535 11/05/16 0404 11/06/16 0320  INR 3.11 2.99 2.60 2.60 2.52   Cardiac Enzymes: No results for input(s): CKTOTAL, CKMB, CKMBINDEX, TROPONINI in the last  168 hours. BNP (last 3 results) No results for input(s): PROBNP in the last 8760 hours.  CBG:  Recent Labs Lab 11/05/16 1647 11/05/16 2143 11/06/16 0636 11/06/16 1148 11/06/16 1446  GLUCAP 119* 163* 133* 103* 100*    Studies: No results found.   Scheduled Meds: . [MAR Hold] brimonidine  1 drop Both Eyes Daily  . [facility] Hold]  doxycycline (VIBRAMYCIN) IV  100 mg Intravenous Q12H  . [MAR Hold] feeding supplement (GLUCERNA SHAKE)  237 mL Oral TID BM  . [MAR Hold] feeding supplement (PRO-STAT SUGAR FREE 64)  30 mL Oral TID  . [MAR Hold] fluconazole (DIFLUCAN) IV  100 mg Intravenous Q24H  . [MAR Hold] furosemide  40 mg Intravenous Once  . [MAR Hold] insulin aspart  0-9 Units Subcutaneous TID WC & HS  . [MAR Hold] loperamide  2 mg Oral TID  . [MAR Hold] metoCLOPramide (REGLAN) injection  5 mg Intravenous Q6H  . [MAR Hold] nadolol  20 mg Oral Daily  . [MAR Hold] nystatin   Topical BID  . [MAR Hold] pantoprazole  40 mg Oral BID  . [MAR Hold] PARoxetine  20 mg Oral Daily  . [MAR Hold] saccharomyces boulardii  250 mg Oral BID  . [MAR Hold] spironolactone  25 mg Oral Daily  . [MAR Hold] sucralfate  1 g Oral TID WC & HS   Continuous Infusions: PRN Meds: 0.9 % irrigation (POUR BTL), [MAR Hold] acetaminophen **OR** [MAR Hold] acetaminophen, [MAR Hold] Influenza vac split quadrivalent PF, [MAR Hold] LORazepam, [MAR Hold] methocarbamol, [MAR Hold]  morphine injection, [MAR Hold] naLOXone (NARCAN)  injection, [MAR Hold] ondansetron, [MAR Hold] pneumococcal 23 valent vaccine, [MAR Hold] promethazine, sodium chloride irrigation, [MAR Hold] traZODone  Time spent: 30 minutes  Author: Lynden Oxford, MD [facility] Pager: [phone] 11/06/2016 3:56 PM  If 7PM-7AM, please contact night-coverage at [email or web address], password [email or web address]

## 2016-11-06 NOTE — Anesthesia Postprocedure Evaluation (Addendum)
Anesthesia Post Note  Patient: Iantha FallenKenneth Mcaleer  Procedure(s) Performed: Procedure(s) (LRB): IRRIGATION AND DEBRIDEMENT EXTREMITY/LEFT FOOT (Left) APPLICATION OF WOUND VAC (Left)  Patient location during evaluation: PACU Anesthesia Type: General Level of consciousness: awake and sedated Pain management: pain level controlled Vital Signs Assessment: post-procedure vital signs reviewed and stable Respiratory status: spontaneous breathing, nonlabored ventilation, respiratory function stable and patient connected to nasal cannula oxygen Cardiovascular status: blood pressure returned to baseline and stable Postop Assessment: no signs of nausea or vomiting Anesthetic complications: no       Last Vitals:  Vitals:   11/06/16 1615 11/06/16 1630  BP: (!) 91/45   Pulse: 60 (!) 58  Resp: 15 20  Temp:      Last Pain:  Vitals:   11/06/16 1615  TempSrc:   PainSc: 0-No pain                 Abdulai Blaylock,JAMES TERRILL

## 2016-11-06 NOTE — Interval H&P Note (Signed)
History and Physical Interval Note:  11/06/2016 3:00 PM  Jeffery [name]  has presented today for surgery, with the diagnosis of Infected Left Foot  The various methods of treatment have been discussed with the patient and family. After consideration of risks, benefits and other options for treatment, the patient has consented to  Procedure(s): IRRIGATION AND DEBRIDEMENT EXTREMITY/LEFT FOOT (Left) APPLICATION OF WOUND VAC (Left) as a surgical intervention .  The patient's history has been reviewed, patient examined, no change in status, stable for surgery.  I have reviewed the patient's chart and labs.  Questions were answered to the patient's satisfaction.     Eldred MangesMark C Andreka Stucki

## 2016-11-06 NOTE — Anesthesia Preprocedure Evaluation (Signed)
Anesthesia Evaluation  Patient identified by MRN, date of birth, ID band Patient awake    Reviewed: Allergy & Precautions, NPO status , Patient's Chart, lab work & pertinent test results  Airway Mallampati: II  TM Distance: >3 FB Neck ROM: Full    Dental no notable dental hx. (+) Poor Dentition   Pulmonary neg pulmonary ROS, asthma ,    Pulmonary exam normal breath sounds clear to auscultation       Cardiovascular hypertension, Pt. on medications negative cardio ROS Normal cardiovascular exam Rhythm:Regular Rate:Normal     Neuro/Psych negative neurological ROS  negative psych ROS   GI/Hepatic (+) Cirrhosis   Esophageal Varices    ,   Endo/Other  diabetes, Type 2  Renal/GU Renal InsufficiencyRenal disease  negative genitourinary   Musculoskeletal negative musculoskeletal ROS (+)   Abdominal   Peds negative pediatric ROS (+)  Hematology  (+) anemia ,   Anesthesia Other Findings   Reproductive/Obstetrics negative OB ROS                             Anesthesia Physical Anesthesia Plan  ASA: III  Anesthesia Plan: General   Post-op Pain Management:    Induction: Intravenous  Airway Management Planned: LMA  Additional Equipment:   Intra-op Plan:   Post-operative Plan: Extubation in OR  Informed Consent: I have reviewed the patients History and Physical, chart, labs and discussed the procedure including the risks, benefits and alternatives for the proposed anesthesia with the patient or authorized representative who has indicated his/her understanding and acceptance.   Dental advisory given  Plan Discussed with:   Anesthesia Plan Comments:         Anesthesia Quick Evaluation

## 2016-11-06 NOTE — Progress Notes (Addendum)
Physical Therapy Treatment Note  This session focused on OOB transfer/bed mobility. AP transfer performed to avoid WB on L LE due to wound dehiscence yesterday. Pt to OR today. Pt appeared to be in better spirits today and was getting to teach a class online. Continue to progress as tolerated with anticipated d/c to SNF for further skilled PT services.     11/06/16 1541  PT Visit Information  Last PT Received On 11/06/16  Assistance Needed +2  History of Present Illness Pt adm with diarrhea and weakness. Pt also with nonhealing ulcer of lt foot. Pt underwent left first ray amputation. PMH - DM, rt AKA,  Subjective Data  Patient Stated Goal get strength back  Precautions  Precautions Fall  Precaution Comments previous Right AKA  Required Braces or Orthoses Other Brace/Splint (WBAT with DARCO shoe)  Other Brace/Splint Darco Shoe  Restrictions  Weight Bearing Restrictions Yes  RLE Weight Bearing WBAT  LLE Weight Bearing WBAT  Other Position/Activity Restrictions with Darco shoe on  Pain Assessment  Pain Assessment Faces  Faces Pain Scale 6  Pain Location R hip with mobility  Pain Descriptors / Indicators Grimacing;Guarding;Sore  Pain Intervention(s) Monitored during session;Repositioned  Cognition  Arousal/Alertness Awake/alert  Behavior During Therapy WFL for tasks assessed/performed  Overall Cognitive Status Within Functional Limits for tasks assessed  Bed Mobility  Overal bed mobility Needs Assistance  Bed Mobility Supine to Sit  Supine to sit Min assist;+2 for physical assistance  General bed mobility comments cues for sequencing and technique; pt able to come into long sitting initally and required assist to elevate trunk and bring bilat LE toward EOB with use of bed pad  Transfers  Overall transfer level Needs assistance  Equipment used (bed pad)  Transfers Anterior-Posterior Transfer  Anterior-Posterior transfers Max assist;+2 physical assistance  General transfer  comment pt with carry over of technique; cues for hand placement and assist to scoot posteriorly with use of bed pad  Balance  Overall balance assessment Needs assistance  Sitting-balance support No upper extremity supported;Feet supported  Sitting balance-Leahy Scale Good  General Comments  General comments (skin integrity, edema, etc.) avoided WB on L LE due to wound dehiscence yesterday  PT - End of Session  Equipment Utilized During Treatment Gait belt  Activity Tolerance Patient tolerated treatment well  Patient left with call bell/phone within reach;in chair  Nurse Communication Mobility status  PT - Assessment/Plan  PT Plan Current plan remains appropriate  PT Frequency (ACUTE ONLY) Min 3X/week  Follow Up Recommendations SNF  PT equipment None recommended by PT  PT Goal Progression  Progress towards PT goals Progressing toward goals  PT Time Calculation  PT Start Time (ACUTE ONLY) 1036  PT Stop Time (ACUTE ONLY) 1055  PT Time Calculation (min) (ACUTE ONLY) 19 min  PT General Charges  $$ ACUTE PT VISIT 1 Procedure  PT Treatments  $Therapeutic Activity 8-22 mins   Erline LevineKellyn Raahi Korber, PTA Pager: [phone]) [phone]

## 2016-11-06 NOTE — Op Note (Addendum)
Preop diagnosis: Left foot previous great toe first ray amputation with the wound dehiscence and hematoma.  Postop diagnosis same  Procedure: Sharp excisional debridement of necrotic skin and subcutaneous tissue, evacuation of hematoma left foot first ray amputation site. Placement of small VAC.  Wound 3cm wide, 7cm long, 4 cm deep.   Surgeon: Annell GreeningMark Keziyah Kneale M.D.  Anesthesia Gen. LMA.  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 He developed an ulcer on his right foot with extension down to bone and had a first ray amputation. Postoperatively his have problems with the BUN/creatinine creatinine elevation with the renal insufficiency and liver enzyme elevations with the drop in his hemoglobin and platelet decreased now down to only 48,000. When he was transferring using the only as he'll and a Darco shoe he developed some wound dehiscence and hematoma. Edge of the wound had some black skin necrosis with intermittent drainage requiring pressor dressing. He has been on antibiotics for several days.  Procedure after scrubbing with the Betadine scrub and solution with a calf tourniquet applied case her bleeding problems with his extremely low platelet count of extremity sheets and drapes were applied. Proximal portion of the wound and the had the skin pulled loose from the sutures along the dorsal side of the incision. The sutures removed. The distal aspect sutures are still intact. Blood clots were present and these were suctioned out. Edge of the dorsal skin had a 3-4 mm x 2 cm length that was necrotic and this was sharply excised with a 10 scalpel blade back to bleeding skin surface. The bone at the level resection looked good there was no evidence  of purulence. Blood clot was suctioned out. Some the tissue was lightly scrape to get some bleeding but not so much that there be concerned with the continued hemorrhage with his low platelet count of 40,000. Small VAC sponge was placed and it was placed at 100 mm pressure, continuous. There was low leak rate. 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 Maximum effort to save the foot in order to allow him to continue to be mobile and active as well as working in the community by his diabetes , renal and liver problems. I have discussed with the patient that despite best efforts it is still possible that he may end up with a below-knee amputation.

## 2016-11-06 NOTE — Progress Notes (Signed)
Patient ID: Jeffery [name], male   DOB: 07/12/1951, 65 y.o.   MRN: [id number] Due to wound partial dehiscence with bleeding will proceed with left foot debridement and VAC placement to assist in healing since he cannot wear prosthesis on his other leg and lived independently using W/C and was FWB on his left foot for transfers before admission. If BKA done then he would have significant decrease in mobility and independence . Discussed with hospitalist plan for care and also patient by phone and I will see him later today before surgery. He agrees to proceed. Order for consent done.  

## 2016-11-07 ENCOUNTER — Encounter (HOSPITAL_COMMUNITY): Payer: Self-pay | Admitting: Orthopaedic Surgery

## 2016-11-07 DIAGNOSIS — K7460 Unspecified cirrhosis of liver: Secondary | ICD-10-CM

## 2016-11-07 LAB — CBC WITH DIFFERENTIAL/PLATELET
BASOS ABS: 0 10*3/uL (ref 0.0–0.1)
BASOS PCT: 0 %
EOS ABS: 0.6 10*3/uL (ref 0.0–0.7)
Eosinophils Relative: 11 %
HCT: 25.8 % — ABNORMAL LOW (ref 39.0–52.0)
HEMOGLOBIN: 8.7 g/dL — AB (ref 13.0–17.0)
Lymphocytes Relative: 13 %
Lymphs Abs: 0.7 10*3/uL (ref 0.7–4.0)
MCH: 32.6 pg (ref 26.0–34.0)
MCHC: 33.7 g/dL (ref 30.0–36.0)
MCV: 96.6 fL (ref 78.0–100.0)
Monocytes Absolute: 0.6 10*3/uL (ref 0.1–1.0)
Monocytes Relative: 12 %
NEUTROS PCT: 63 %
Neutro Abs: 3.1 10*3/uL (ref 1.7–7.7)
Platelets: 46 10*3/uL — ABNORMAL LOW (ref 150–400)
RBC: 2.67 MIL/uL — AB (ref 4.22–5.81)
RDW: 19.1 % — ABNORMAL HIGH (ref 11.5–15.5)
WBC: 5 10*3/uL (ref 4.0–10.5)

## 2016-11-07 LAB — COMPREHENSIVE METABOLIC PANEL
ALBUMIN: 1.9 g/dL — AB (ref 3.5–5.0)
ALK PHOS: 45 U/L (ref 38–126)
ALT: 18 U/L (ref 17–63)
ANION GAP: 6 (ref 5–15)
AST: 39 U/L (ref 15–41)
BUN: 35 mg/dL — AB (ref 6–20)
CALCIUM: 8.4 mg/dL — AB (ref 8.9–10.3)
CO2: 21 mmol/L — AB (ref 22–32)
CREATININE: 1.69 mg/dL — AB (ref 0.61–1.24)
Chloride: 109 mmol/L (ref 101–111)
GFR calc Af Amer: 47 mL/min — ABNORMAL LOW (ref >60)
GFR calc non Af Amer: 41 mL/min — ABNORMAL LOW (ref >60)
GLUCOSE: 154 mg/dL — AB (ref 65–99)
Potassium: 3.6 mmol/L (ref 3.5–5.1)
SODIUM: 136 mmol/L (ref 135–145)
Total Bilirubin: 2.7 mg/dL — ABNORMAL HIGH (ref 0.3–1.2)
Total Protein: 5.6 g/dL — ABNORMAL LOW (ref 6.5–8.1)

## 2016-11-07 LAB — PROTIME-INR
INR: 2.4
PROTHROMBIN TIME: 26.6 s — AB (ref 11.4–15.2)

## 2016-11-07 LAB — GLUCOSE, CAPILLARY
GLUCOSE-CAPILLARY: 154 mg/dL — AB (ref 65–99)
GLUCOSE-CAPILLARY: 138 mg/dL — AB (ref 65–99)
Glucose-Capillary: 125 mg/dL — ABNORMAL HIGH (ref 65–99)
Glucose-Capillary: 150 mg/dL — ABNORMAL HIGH (ref 65–99)

## 2016-11-07 LAB — PHOSPHORUS: PHOSPHORUS: 3.5 mg/dL (ref 2.5–4.6)

## 2016-11-07 MED ORDER — INSULIN ASPART 100 UNIT/ML ~~LOC~~ SOLN: 0.0000 [IU] | Freq: Three times a day (TID) | SUBCUTANEOUS | 11 refills | Status: DC

## 2016-11-07 MED ORDER — METOCLOPRAMIDE HCL 5 MG PO TABS: 5.0000 mg | ORAL_TABLET | Freq: Four times a day (QID) | ORAL | 0 refills | Status: DC | PRN

## 2016-11-07 MED ORDER — SPIRONOLACTONE 25 MG PO TABS: 50.0000 mg | ORAL_TABLET | Freq: Every day | ORAL | 0 refills | Status: DC

## 2016-11-07 MED ORDER — PAROXETINE HCL 20 MG PO TABS: 20.0000 mg | ORAL_TABLET | Freq: Every day | ORAL | 0 refills | Status: DC

## 2016-11-07 MED ORDER — GLUCERNA SHAKE PO LIQD: 237.0000 mL | Freq: Three times a day (TID) | ORAL | 0 refills | Status: DC

## 2016-11-07 MED ORDER — ALBUMIN HUMAN 25 % IV SOLN
25.0000 g | Freq: Once | INTRAVENOUS | Status: AC
  Administered 2016-11-07: 25 g via INTRAVENOUS
  Filled 2016-11-07: qty 100

## 2016-11-07 MED ORDER — LOPERAMIDE HCL 2 MG PO CAPS: 2.0000 mg | ORAL_CAPSULE | ORAL | 0 refills | Status: AC | PRN

## 2016-11-07 MED ORDER — FUROSEMIDE 20 MG PO TABS: 20.0000 mg | ORAL_TABLET | Freq: Every day | ORAL | 0 refills | Status: DC

## 2016-11-07 MED ORDER — NADOLOL 20 MG PO TABS: 20.0000 mg | ORAL_TABLET | Freq: Every day | ORAL | 0 refills | Status: DC

## 2016-11-07 MED ORDER — METHOCARBAMOL 500 MG PO TABS: 250.0000 mg | ORAL_TABLET | Freq: Three times a day (TID) | ORAL | 0 refills | Status: DC | PRN

## 2016-11-07 MED ORDER — SUCRALFATE 1 GM/10ML PO SUSP: 1.0000 g | Freq: Three times a day (TID) | ORAL | 0 refills | Status: DC

## 2016-11-07 MED ORDER — NYSTATIN 100000 UNIT/GM EX POWD: Freq: Two times a day (BID) | CUTANEOUS | 0 refills | Status: DC

## 2016-11-07 MED ORDER — SACCHAROMYCES BOULARDII 250 MG PO CAPS: 250.0000 mg | ORAL_CAPSULE | Freq: Two times a day (BID) | ORAL | 0 refills | Status: DC

## 2016-11-07 MED ORDER — PANTOPRAZOLE SODIUM 40 MG PO TBEC: 40.0000 mg | DELAYED_RELEASE_TABLET | Freq: Two times a day (BID) | ORAL | 0 refills | Status: AC

## 2016-11-07 MED ORDER — PRO-STAT SUGAR FREE PO LIQD: 30.0000 mL | Freq: Three times a day (TID) | ORAL | 0 refills | Status: DC

## 2016-11-07 MED ORDER — FUROSEMIDE 10 MG/ML IJ SOLN
40.0000 mg | Freq: Once | INTRAMUSCULAR | Status: AC
  Administered 2016-11-07: 40 mg via INTRAVENOUS
  Filled 2016-11-07: qty 4

## 2016-11-07 NOTE — Progress Notes (Signed)
Occupational Therapy Treatment Patient Details Name: Jeffery [name] MRN: [id number] DOB: 1951-06-16 Today's Date: 11/07/2016    History of present illness Pt adm with diarrhea and weakness. Pt also with nonhealing ulcer of lt foot. Pt underwent left first ray amputation. PMH - DM, rt AKA,   OT comments  Pt weight bearing status changed to NWB for LLE, AP transfer this session, with Pt able to instruct. Pt able to perform seated grooming with set up this session. Pt loves playing music during session and is very pleasant and a joy to work with. Next session to continue to focus on strengthening UB for AP transfers and allowing Pt to do as much independently as possible.  Follow Up Recommendations  SNF;Supervision/Assistance - 24 hour    Equipment Recommendations  Other (comment) (defer to next venue)    Recommendations for Other Services      Precautions / Restrictions Precautions Precautions: Fall Precaution Comments: previous Right AKA Required Braces or Orthoses: Other Brace/Splint (wound vac on LLE) Restrictions Weight Bearing Restrictions: Yes RLE Weight Bearing: Weight bearing as tolerated LLE Weight Bearing: Non weight bearing       Mobility Bed Mobility Overal bed mobility: Needs Assistance Bed Mobility: Supine to Sit     Supine to sit: Min assist;+2 for physical assistance     General bed mobility comments: cues for sequencing and technique; pt able to come into long sitting initally and required assist to elevate trunk and bring bilat LE toward EOB with use of bed pad  Transfers Overall transfer level: Needs assistance Equipment used:  (bed pad) Transfers: Licensed conveyancer transfers: Max assist;+2 physical assistance   General transfer comment: pt with carry over of technique; cues for hand placement and assist to scoot posteriorly with use of bed pad    Balance Overall balance assessment: Needs  assistance Sitting-balance support: No upper extremity supported;Feet supported Sitting balance-Leahy Scale: Fair                             ADL       Grooming: Therapist, nutritional;Set up;Sitting Grooming Details (indicate cue type and reason): in recliner                   Toilet Transfer Details (indicate cue type and reason): Pt used urinal in bed prior to transfer and was able to perform peri care                  Vision                     Perception     Praxis      Cognition   Behavior During Therapy: [facility] for tasks assessed/performed Overall Cognitive Status: Within Functional Limits for tasks assessed                         Exercises     Shoulder Instructions       General Comments      Pertinent Vitals/ Pain       Pain Assessment: 0-10 Pain Score: 3  Pain Location: R hip with mobility, and bottom Pain Descriptors / Indicators: Grimacing;Guarding;Sore Pain Intervention(s): Monitored during session;Repositioned  Home Living  Prior Functioning/Environment              Frequency  Min 2X/week        Progress Toward Goals  OT Goals(current goals can now be found in the care plan section)     Acute Rehab OT Goals Patient Stated Goal: to keep LLE OT Goal Formulation: With patient Time For Goal Achievement: 11/10/16 Potential to Achieve Goals: Good  Plan Discharge plan remains appropriate    Co-evaluation                 End of Session    OT Visit Diagnosis: Other abnormalities of gait and mobility (R26.89)   Activity Tolerance Patient tolerated treatment well   Patient Left in chair;with call bell/phone within reach   Nurse Communication Mobility status;Weight bearing status        Time: 4401-02721144-1201 OT Time Calculation (min): 17 min  Charges: OT General Charges $OT Visit: 1 Procedure OT Treatments $Therapeutic Activity: 8-22  mins  Sherryl MangesLaura Kaylena Pacifico OTR/L [phone]    Evern BioLaura J Miette Molenda 11/07/2016, 4:46 PM

## 2016-11-07 NOTE — Discharge Summary (Signed)
[facility] Discharge Summary   Patient: Jeffery [name] ZOX:[id number]   PCP: Julian Hy, MD DOB: 1950-12-07   Date of admission: 10/23/2016   Date of discharge:  11/08/2016    Discharge Diagnoses:  Principal Problem:   Osteomyelitis of left foot (HCC) Active Problems:   Hemolytic anemia (HCC)   Cirrhosis of liver with ascites (HCC)   Esophageal varices without bleeding (HCC)   Portal hypertensive gastropathy   C. difficile colitis   AKI (acute kidney injury) (HCC)   Diarrhea   Noninfectious gastroenteritis   Gastrointestinal hemorrhage   Controlled diabetes mellitus type 2 with complications (HCC)   Anxiety state   Somnolence   Moderate malnutrition (HCC)   Multiple duodenal ulcers   Ulcer of esophagus without bleeding   Melena   Chronic liver failure without hepatic coma (HCC)   Advance care planning   Goals of care, counseling/discussion   Palliative care by specialist   End stage liver disease (HCC)   Cirrhosis of liver (HCC)   Admitted From: home Disposition:  SNF  Recommendations for Outpatient Follow-up:  1. Follow-up with PCP in one week, Dr. Ophelia Charter in 1 week, GI in 2 weeks. 2. May require repeat paracentesis in one week. 3. Check CBC and CMP in one week  4. Wound VAC change Monday Wednesday Friday. Duration of wound VAC 3 weeks   Contact information for follow-up providers    Eldred Manges, MD. Schedule an appointment as soon as possible for a visit today.   Specialty:  Orthopedic Surgery Why:  needs follow up appointment one week after discharge from hospital.  Contact information: [address] [place] [address] [phone]        Julian Hy, MD. Schedule an appointment as soon as possible for a visit in 1 week(s).   Specialty:  Endocrinology Contact information: [address] [place] [address] [phone]        Rachael Fee, MD. Schedule an appointment as soon as possible for a visit in 2  week(s).   Specialty:  Gastroenterology Contact information: [address] [place] [address] [phone]            Contact information for after-discharge care    Destination    [facility] SN[facility] Follow up.   Specialty:  Skilled Nursing Facility Contact information: [address] [place] [address] [phone]                 Diet recommendation: Cardiac diet  Activity: The patient is advised to gradually reintroduce usual activities.  Discharge Condition: good  Code Status: DNR/DNI  History of present illness: As per the H and P dictated on admission, "Jeffery [name] is a 66 y.o. male with medical history significant of liver cirrhosis with asxcities, esophageal varices, portal hypertensive gastropathy, hemolytic anemia, thrombocytopenia, status BKA for diabetic foot infection presented to the emergency room with complaints of abdominal pain and frequent diarrhea. Patient has been tested for hepatitis and screening was negative. He is not an alcoholic and has no previous history of heavy alcohol use. During the last few weeks he's been having frequent loose stools that were worsening in frequency over the last 2 - 3 days  to the point that he is unable to eat because food intake causes him to run to the bathroom continuously and .he has become severely weak. He also c/o having nausea and dry heaves, feeling of fullness in the stomack that despite diarrhea is  not getting better, but feeling worse. Patient also reported of becoming very emotional and crying a lot without any apparent reason  He has been treated with tetracycline ( now the second round) for nearly 4 weeks for non healing diabetic ulcer of the left foot and erythema is still persent and he dose not feel that the wound is healing, although erythema has improved"  Hospital Course:   Summary of his active problems in the hospital is as following. 1.  Persistent diarrhea with nausea, resolved. Presenting symptom C. difficile negative. FOBT positive. GI pathogen panel negative CT scan shows pancolitis but more likely hypoalbuminemia causing wall thickening. GI has been consulted. On probiotic,  scheduled imodium.Changed to when necessary. Also on scheduled Reglan, change to when necessary  2. Left toe osteomyelitis. Sepsis present on admission currently resolved S/P first ray amputation. Pathology report shows clean margins. Discuss with infectious disease, recommended to complete 14 days of antibiotics. Patient was on doxycycline and IV Zosyn. Cultures negative. change to IV doxycycline and IV Unasyn while in the hospital. Probiotics added. Last day of antibiotic 11/07/2016. Patient underwent washout of the wound and wound VAC placement 11/06/2016. VAC change in the hospital on 11/08/2016 and then patient discharged to SNF.  3. Cirrhosis of the liver. Portal hypertensive gastropathy, esophageal varices. Duodenal ulcers hematemesis as well as melena. Acute blood loss anemia GI consulted, appreciate input. Nadolol dose has been reduced from 40 mg with 20 mg due to hypotension and acute kidney injury. Hypoalbuminemia, low fibrinogen level, elevated INR and chronic thrombocytopenia all secondary to cirrhosis. S/P paracentesis 10/24/2016 with 3 L of fluid removed. No evidence of SBP. S/P EGD; on protonix, given IV diflucan, gastroenterology initiated on vitamin K 10 mg 3, no significant change in INR despite that. Large volume ascites still present, may need taping next week. Per GI No indication to perform paracentesis at present. Continue 50 mg Aldactone and Lasix.  4. chronic thrombocytopenia Platelet has been low since last one year. Likely in the setting of liver disease. Elevated INR and low fibrinogen due to liver disease as well. I highly doubt presence of DIC TTP at present. 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 Peripheral smear shows leukocytes without any schistocytes. Stop aspirin and use mechanical prophylaxis for DVT. Platelet remains low after transfusion, due to cirrhosis or sepsis associated ITP.  5. Acute kidney injury. Seems to improve after blood transfusion. Will monitor with daily weight and strict IN and OUT. Baseline renal function 1.3, on presentation 2.49, now stable, probably multifactorial Due to hypotension and dehydration from diarrhea. Unclear whether surgery as well as paracentesis has any role to play in persistence of renal dysfunction. US renal negative. Reduce the dose of nadolol. Given IV albumin.  6. Type 2 diabetes mellitus. Continue sliding scale insulin at the nursing home. CBG before meals at bedtime A1c 5.8 on admission.  All other chronic medical condition were stable during the hospitalization.  Patient was seen by physical therapy, who recommended SNF, which was arranged by Child psychotherapist and case Production designer, theatre/television/film. On the day of the discharge the patient's vitals were stable, and no other acute medical condition were reported by patient. the patient was felt safe to be discharge at SNF with therapy.  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   PRBC transfusion.  EGD.  Platelet transfusion 1  Consultations:  GI, orthopedics, palliative care  DISCHARGE MEDICATION: Current Discharge Medication List    START taking these medications   Details  Amino Acids-Protein Hydrolys (FEEDING SUPPLEMENT, PRO-STAT SUGAR FREE 64,) LIQD Take 30 mLs by mouth 3 (three) times daily. Qty: 900 mL, Refills: 0    feeding supplement, GLUCERNA SHAKE, (GLUCERNA SHAKE) LIQD Take 237 mLs by mouth 3 (three) times daily between meals. Qty: 21 Can, Refills: 0    furosemide (LASIX) 20 MG tablet Take 1 tablet (20 mg total) by mouth daily. Qty: 30 tablet, Refills: 0    insulin  aspart (NOVOLOG) 100 UNIT/ML injection Inject 0-9 Units into the skin 4 (four) times daily -  with meals and at bedtime. Qty: 10 mL, Refills: 11    loperamide (IMODIUM) 2 MG capsule Take 1 capsule (2 mg total) by mouth as needed for diarrhea or loose stools. Qty: 30 capsule, Refills: 0    methocarbamol (ROBAXIN) 500 MG tablet Take 0.5 tablets (250 mg total) by mouth every 8 (eight) hours as needed for muscle spasms. Qty: 15 tablet, Refills: 0    metoCLOPramide (REGLAN) 5 MG tablet Take 1 tablet (5 mg total) by mouth every 6 (six) hours as needed for nausea. Qty: 20 tablet, Refills: 0    nystatin (MYCOSTATIN/NYSTOP) powder Apply topically 2 (two) times daily. Qty: 15 g, Refills: 0    pantoprazole (PROTONIX) 40 MG tablet Take 1 tablet (40 mg total) by mouth 2 (two) times daily. Qty: 60 tablet, Refills: 0    PARoxetine (PAXIL) 20 MG tablet Take 1 tablet (20 mg total) by mouth daily. Qty: 30 tablet, Refills: 0    saccharomyces boulardii (FLORASTOR) 250 MG capsule Take 1 capsule (250 mg total) by mouth 2 (two) times daily. Qty: 10 capsule, Refills: 0    spironolactone (ALDACTONE) 25 MG tablet Take 2 tablets (50 mg total) by mouth daily. Qty: 30 tablet, Refills: 0    sucralfate (CARAFATE) 1 GM/10ML suspension Take 10 mLs (1 g total) by mouth 4 (four) times daily -  with meals and at bedtime. Qty: 420 mL, Refills: 0      CONTINUE these medications which have CHANGED   Details  nadolol (CORGARD) 20 MG tablet Take 1 tablet (20 mg total) by mouth daily. Qty: 30 tablet, Refills: 0      CONTINUE these medications which have NOT CHANGED   Details  atorvastatin (LIPITOR) 40 MG tablet Take 40 mg by mouth daily. Refills: 2    brimonidine (ALPHAGAN P) 0.1 % SOLN Place 1 drop into both eyes daily.     Multiple Vitamin (MULTIVITAMIN WITH MINERALS) TABS tablet Take 1 tablet by mouth daily.      STOP taking these medications     aspirin 81 MG tablet      doxycycline (VIBRA-TABS) 100 MG  tablet      losartan (COZAAR) 50 MG tablet        No Known Allergies Discharge Instructions    Diet - low sodium heart healthy    Complete by:  As directed    Discharge instructions    Complete by:  As directed    It is important that you read following instructions as well as go over your medication list with RN to help you understand your care after this hospitalization.  Discharge Instructions: Please follow-up with PCP in one week  Please request your primary care physician to go over all Hospital Tests and Procedure/Radiological results at the follow up,  Please get all Hospital records sent to your PCP by signing  hospital release before you go home.   Do not take more than prescribed Pain, Sleep and Anxiety Medications. You were cared for by a hospitalist during your hospital stay. If you have any questions about your discharge medications or the care you received while you were in the hospital after you are discharged, you can call the unit and ask to speak with the hospitalist on call if the hospitalist that took care of you is not available.  Once you are discharged, your primary care physician will handle any further medical issues. Please note that NO REFILLS for any discharge medications will be authorized once you are discharged, as it is imperative that you return to your primary care physician (or establish a relationship with a primary care physician if you do not have one) for your aftercare needs so that they can reassess your need for medications and monitor your lab values. You Must read complete instructions/literature along with all the possible adverse reactions/side effects for all the Medicines you take and that have been prescribed to you. Take any new Medicines after you have completely understood and accept all the possible adverse reactions/side effects. Wear Seat belts while driving. If you have smoked or chewed Tobacco in the last 2 yrs please stop smoking  and/or stop any Recreational drug use.   Increase activity slowly    Complete by:  As directed      Discharge Exam: Filed Weights   11/04/16 0547 11/06/16 0500 11/07/16 0533  Weight: 111.7 kg (246 lb 4.1 oz) 114.8 kg (253 lb) 118.5 kg (261 lb 4.8 oz)   Vitals:   11/07/16 0533 11/07/16 1506  BP: (!) 100/49 (!) 116/56  Pulse: 60   Resp: 16 17  Temp: 98.4 F (36.9 C) 98.4 F (36.9 C)   General: Appear in no distress, multiple bruises on the back and on leg; Oral Mucosa moist. Cardiovascular: S1 and S2 Present, no Murmur, no JVD Respiratory: Bilateral Air entry present and Clear to Auscultation, no Crackles, no wheezes Abdomen: Bowel Sound present, Soft and no tenderness Extremities: left leg Pedal edema, no calf tenderness Neurology: Grossly no focal neuro deficit.  The results of significant diagnostics from this hospitalization (including imaging, microbiology, ancillary and laboratory) are listed below for reference.    Significant Diagnostic Studies: Ct Abdomen Pelvis Wo Contrast  Result Date: 10/23/2016 CLINICAL DATA:  Abdominal pain with diarrhea for 2-3 days EXAM: CT ABDOMEN AND PELVIS WITHOUT CONTRAST TECHNIQUE: Multidetector CT imaging of the abdomen and pelvis was performed following the standard protocol without IV contrast. COMPARISON:  None. FINDINGS: Lower chest: No acute abnormality. Hepatobiliary: Liver is diminutive in size with a nodular contour consistent with cirrhosis. Large volume abdominal ascites. Cholelithiasis without biliary dilatation. Pancreas: Unremarkable. No pancreatic ductal dilatation or surrounding inflammatory changes. Spleen: Normal in size without focal abnormality. Adrenals/Urinary Tract: Normal adrenal glands. No urolithiasis with or obstructive uropathy. 1.7 x 1.9 cm hypodense, fluid attenuating right interpolar renal mass most consistent with a cyst. 5.4 x 4.8 cm hypodense, fluid attenuating left renal mass most consistent with a cyst. 13 x 9 mm  hyperdense exophytic left posterior interpolar renal mass likely reflecting a hemorrhagic or proteinaceous cyst but is technically indeterminate. Normal bladder. Stomach/Bowel: No bowel dilatation. Mild relative bowel wall thickening throughout the colon which may reflect reactive edema secondary to ascites versus mild colitis. No pneumatosis, pneumoperitoneum or portal venous gas. Normal appendix. Vascular/Lymphatic: Normal caliber abdominal aorta with atherosclerosis. No lymphadenopathy. Reproductive: Prostate is unremarkable. Other: No other  fluid collection or hematoma. Musculoskeletal: No lytic or sclerotic osseous lesion. Diffuse thoracolumbar spine spondylosis. IMPRESSION: 1. Cirrhosis with large volume ascites. 2. Mild relative bowel wall thickening throughout the colon which may reflect reactive edema secondary to ascites versus mild colitis. 3. Cholelithiasis. 4. Bilateral renal cysts. Electronically Signed   By: Elige Ko   On: 10/23/2016 21:14   US Renal  Result Date: 10/31/2016 CLINICAL DATA:  Acute kidney injury. EXAM: RENAL / URINARY TRACT ULTRASOUND COMPLETE COMPARISON:  Ultrasound of September 01, 2016. FINDINGS: Right Kidney: Length: 13.2 cm. Echogenicity within normal limits. No mass or hydronephrosis visualized. Left Kidney: Length: 13.5 cm. 5.2 cm simple cyst is seen in lower pole. 2.3 cm simple cyst is seen in upper pole. Echogenicity within normal limits. No mass or hydronephrosis visualized. Bladder: Appears normal for degree of bladder distention. Ascites is noted. IMPRESSION: Ascites.  Simple left renal cysts.  No other abnormality seen. Electronically Signed   By: Lupita Raider, M.D.   On: 10/31/2016 09:37   Mr Foot Left Wo Contrast  Result Date: 10/24/2016 CLINICAL DATA:  Nonhealing ulcer at the ball of the foot below were the great toe use to be. Query osteomyelitis. EXAM: MRI OF THE LEFT FOOT WITHOUT CONTRAST TECHNIQUE: Multiplanar, multisequence MR imaging of the left  forefoot was performed. No intravenous contrast was administered. COMPARISON:  CT left lower extremity 04/06/2010 FINDINGS: Bones/Joint/Cartilage Previous amputation of the great toe at the base of the proximal phalanx. Somewhat irregular appearance of the first metatarsal head with increased marrow signal intensity in the distal first metatarsal head as well as in the remaining proximal phalangeal stump. Focal subperiosteal signal intensity along the metatarsal hand. Changes suggest focal osteomyelitis. Scattered marrow signal intensity changes in the intertarsal bones probably represent degenerative change. Hammertoe deformities. Muscles and Tendons Visualized flexor and extensor tendons appear intact with normal signal intensity. Edema in the plantar musculature of the forefoot suggesting inflammatory change. Soft tissues Subcutaneous soft tissue edema around the first metatarsal head and stump with focal ulceration inferior and lateral to the metatarsal head suggesting cellulitis. No loculated fluid collection to suggest abscess. IMPRESSION: Previous amputation of the right first toe. Soft tissue edema consistent with cellulitis and ulceration at the stomach and over the first metatarsal head. Edema in the plantar musculature. Focal bone erosion and marrow signal changes at the distal first metatarsal head and involving the proximal phalangeal stump likely indicates focal osteomyelitis. Electronically Signed   By: Burman Nieves M.D.   On: 10/24/2016 06:28   US Paracentesis  Result Date: 10/24/2016 INDICATION: New onset abdominal pain with new findings of ascites. Request is made for diagnostic and therapeutic paracentesis. EXAM: ULTRASOUND GUIDED DIAGNOSTIC AND THERAPEUTIC PARACENTESIS MEDICATIONS: 1% lidocaine. COMPLICATIONS: None immediate. PROCEDURE: Informed written consent was obtained from the patient after a discussion of the risks, benefits and alternatives to treatment. A timeout was performed  prior to the initiation of the procedure. Initial ultrasound scanning demonstrates a small amount of ascites within the right lower abdominal quadrant. The right lower abdomen was prepped and draped in the usual sterile fashion. 1% lidocaine was used for local anesthesia. Following this, a 19 gauge, 7-cm, Yueh catheter was introduced. An ultrasound image was saved for documentation purposes. The paracentesis was performed. The catheter was removed and a dressing was applied. The patient tolerated the procedure well without immediate post procedural complication. FINDINGS: A total of approximately 3.3 L of yellow fluid was removed. Samples were sent to the laboratory as requested  by the clinical team. IMPRESSION: Successful ultrasound-guided paracentesis yielding 3.3 liters of peritoneal fluid. Read by: Barnetta Chapel, PA-C Electronically Signed   By: Simonne Come M.D.   On: 10/24/2016 11:23   Dg Abd Portable 1v  Result Date: 10/30/2016 CLINICAL DATA:  Nausea and vomiting. Cholelithiasis and hypertension. EXAM: PORTABLE ABDOMEN - 1 VIEW COMPARISON:  CT abdomen from 10/23/2016 FINDINGS: Moderate gaseous distention of the stomach possibly representing gastroparesis. Scattered air containing nonobstructed, nondistended large bowel. No significant small bowel dilatation. Calcification in the left upper quadrant is noted not clearly identified on the prior CT. It does not appear to represent nephrolithiasis. No definite calcified lymph node is seen on prior CT. Splenic vascular calcification might account for this. Mild degenerate change of the dorsal spine. IMPRESSION: Moderate gaseous distention of the stomach which may represent gastroparesis. No obstructive bowel gas pattern. No definite free air. Electronically Signed   By: Tollie Eth M.D.   On: 10/30/2016 20:24   Dg Foot 2 Views Left  Result Date: 10/24/2016 CLINICAL DATA:  Osteomyelitis EXAM: LEFT FOOT - 2 VIEW COMPARISON:  MRI 10/24/2016 FINDINGS: Changes of  prior amputation of the left great toe. Lucency/irregularity within the distal aspect of the left first metatarsal may reflect area of bone destruction and osteomyelitis. No additional acute bony abnormality. Prior internal fixation of an old healed fifth metatarsal fracture. IMPRESSION: Lucency in the distal left first metatarsal, possibly area of osteomyelitis. Electronically Signed   By: Charlett Nose M.D.   On: 10/24/2016 17:44   US Abdomen Limited Ruq  Result Date: 11/02/2016 CLINICAL DATA:  Hepatic cirrhosis. EXAM: US ABDOMEN LIMITED - RIGHT UPPER QUADRANT COMPARISON:  Ultrasound of October 31, 2016. FINDINGS: Gallbladder: 1.5 cm gallstone is noted. No gallbladder wall thickening or pericholecystic fluid is noted. No sonographic Murphy's sign is noted. Common bile duct: Not visualized. Liver: Liver is small with nodular margins consistent with hepatic cirrhosis. Ascites is noted. IMPRESSION: Ascites. Solitary gallstone without gallbladder wall thickening or pericholecystic fluid. Findings consistent with hepatic cirrhosis. Electronically Signed   By: Lupita Raider, M.D.   On: 11/02/2016 21:47    Microbiology: Recent Results (from the past 240 hour(s))  Gastrointestinal Panel by PCR , Stool     Status: None   Collection Time: 10/30/16  3:18 PM  Result Value Ref Range Status   Campylobacter species NOT DETECTED NOT DETECTED Final   Plesimonas shigelloides NOT DETECTED NOT DETECTED Final   Salmonella species NOT DETECTED NOT DETECTED Final   Yersinia enterocolitica NOT DETECTED NOT DETECTED Final   Vibrio species NOT DETECTED NOT DETECTED Final   Vibrio cholerae NOT DETECTED NOT DETECTED Final   Enteroaggregative E coli (EAEC) NOT DETECTED NOT DETECTED Final   Enteropathogenic E coli (EPEC) NOT DETECTED NOT DETECTED Final   Enterotoxigenic E coli (ETEC) NOT DETECTED NOT DETECTED Final   Shiga like toxin producing E coli (STEC) NOT DETECTED NOT DETECTED Final   Shigella/Enteroinvasive E  coli (EIEC) NOT DETECTED NOT DETECTED Final   Cryptosporidium NOT DETECTED NOT DETECTED Final   Cyclospora cayetanensis NOT DETECTED NOT DETECTED Final   Entamoeba histolytica NOT DETECTED NOT DETECTED Final   Giardia lamblia NOT DETECTED NOT DETECTED Final   Adenovirus F40/41 NOT DETECTED NOT DETECTED Final   Astrovirus NOT DETECTED NOT DETECTED Final   Norovirus GI/GII NOT DETECTED NOT DETECTED Final   Rotavirus A NOT DETECTED NOT DETECTED Final   Sapovirus (I, II, IV, and V) NOT DETECTED NOT DETECTED Final  Labs: CBC:  Recent Labs Lab 11/04/16 0535 11/04/16 2022 11/05/16 0404 11/06/16 1718 11/07/16 0550  WBC 5.1 6.7 5.6 5.5 5.0  NEUTROABS  --   --   --   --  3.1  HGB 7.2* 8.1* 8.5* 9.0* 8.7*  HCT 21.4* 24.3* 24.9* 26.9* 25.8*  MCV 96.0 94.9 95.4 96.1 96.6  PLT 46* 54* 48* 51* 46*   Basic Metabolic Panel:  Recent Labs Lab 11/04/16 0535 11/05/16 0404 11/06/16 0320 11/06/16 1718 11/07/16 0550  NA 137 134* 137 135 136  K 3.7 3.6 4.1 3.7 3.6  CL 111 109 110 107 109  CO2 20* 18* 20* 17* 21*  GLUCOSE 129* 154* 153* 104* 154*  BUN 42* 38* 36* 36* 35*  CREATININE 1.92* 1.71* 1.75* 1.67* 1.69*  CALCIUM 8.7* 8.3* 8.3* 8.5* 8.4*  PHOS 3.2 3.3 3.4 3.2 3.5   Liver Function Tests:  Recent Labs Lab 11/04/16 0535 11/05/16 0404 11/06/16 0320 11/06/16 1718 11/07/16 0550  AST  --   --   --   --  39  ALT  --   --   --   --  18  ALKPHOS  --   --   --   --  45  BILITOT  --   --   --   --  2.7*  PROT  --   --   --   --  5.6*  ALBUMIN 2.0* 1.8* 1.6* 2.1* 1.9*   CBG:  Recent Labs Lab 11/06/16 1446 11/06/16 1604 11/06/16 2124 11/07/16 0625 11/07/16 1216  GLUCAP 100* 102* 122* 154* 125*   Time spent: 30 minutes  Signed:  Kerin Kren  [facility]  11/07/2016  , 6:01 PM

## 2016-11-07 NOTE — Progress Notes (Signed)
[facility] Progress Note  Patient: Jeffery [name] ZOX:[id number]   PCP: Julian Hy, MD DOB: 05-10-1951   DOA: 10/23/2016   DOS: 11/07/2016   Date of Service: the patient was seen and examined on 11/07/2016   Subjective: Tolerated procedure yesterday, no further episodes of bleeding, feels that the swelling is getting better.  Brief hospital course: Pt. with PMH of cirrhosis with ascites, portal hypertension with gastropathy and esophageal varices, hemolytic anemia, chronic thrombocytopenia, right AKA; admitted on 10/23/2016, with complaint of diarrhea, was found to have suspected viral gastroenteritis, GI bleed, left toe osteomyelitis and acute kidney injury. Initially admitted in the step down unit and now transferred to MedSurg floor since 10/30/2016. We will undergo wound VAC placement today Currently further plan is arrange for discharge on 11/08/2016 after wound VAC change.  Assessment and Plan: 1. Persistent diarrhea, resolved. C. difficile negative. FOBT positive. GI pathogen panel negative CT scan shows pancolitis versus possible hypoalbuminemia causing wall thickening. Requirement of 14 day treatment course for antibiotics is not helping his diarrhea as well. GI has been consulted. Now signed off. On probiotic, scheduled imodium. monitor response.  2. 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 Pathology report shows clean margins. Discuss with infectious disease, recommended to continue 14 days of antibiotics. Patient was on doxycycline and IV Zosyn. Cultures so far negative. change to IV doxycycline and IV Unasyn while in the hospital. Probiotics added. Last day of antibiotic 11/07/2016. Wound vac has been removed.  I discussed with Dr. Ophelia Charter, patient underwent washout of the wound and wound VAC placement 11/06/2016. VAC change in the hospital on 11/08/2016 and then patient had been discharged to SNF if otherwise stable.  3. Cirrhosis of the  liver. Portal hypertensive gastropathy, esophageal varices. Duodenal ulcers hematemesis as well as melena. Acute blood loss anemia GI consulted, appreciate input. Transfuse for hemoglobin less than 7. Nadolol dose has been reduced from 40 mg with 20 mg due to hypotension and acute kidney injury. Hypoalbuminemia, low fibrinogen level, elevated INR and chronic thrombocytopenia all secondary to cirrhosis. S/P paracentesis 10/24/2016 with 3 L of fluid removed. No evidence of SBP. S/P EGD; on protonix, IV diflucan, gastroenterology initiated on vitamin K. Large volume ascites still present, may need taping next week. Per GI No indication to perform paracentesis at present. Continue 25 mg Aldactone. Give albumin and Lasix.  4. chronic thrombocytopenia Platelet has been low since last one year. Likely in the setting of liver disease. Elevated INR and low fibrinogen due to liver disease as well. I highly doubt presence of DIC TTP at present. 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 Peripheral smear shows leukocytes without any schistocytes. Stop aspirin and use mechanical prophylaxis for DVT. Platelet remains low after transfusion, due to cirrhosis or sepsis associated ITP. Monitor, if he needs surgery, will follow surgery Recommendation regarding platelet. Discussed with hematology who recommends if he needs higher platelets for his surgery transfuse as needed.  5. Acute kidney injury. Seems to improve after blood transfusion. Will monitor with daily weight and strict IN and OUT. Baseline renal function 1.3, on presentation 2.49, now stable, probably multifactorial Due to hypotension and dehydration from diarrhea. Unclear whether surgery as well as paracentesis has any role to play in persistence of renal dysfunction. US renal negative. Reduce the dose of nadolol. Given IV albumin.  6. Type 2 diabetes mellitus. The patient remains on clear liquid diet and will continue every  4 hours CBG with sliding scale. Monitor. A1c 5.8 on admission.  Bowel regimen: last BM 11/06/2016 Diet: Carb modified DVT Prophylaxis: mechanical compression device  Advance goals of care discussion: I discussed with patient, his prognosis is significantly guarded in the setting of GI bleed, acute kidney injury, wound infection. Pt is also malnourished, which also render's poor prognosis. Palliative care also discussed with the patient, code status has been changed from full code to DNR/DNI.  Patient asked me that if my prognosis is limited is there meaning in continuing what we're doing? 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 He tells me that if death wants to take me it has to come with an [employer].  We'll monitor.  Family Communication: no family was present at bedside, at the time of interview.   Disposition:  Discharge to SNF. Expected discharge date: 11/08/2016, after wound VAC change tomorrow  Consultants: Gastroenterology, orthopedics, palliative care Procedures: First ray amputation of the left leg.  Incision and Debridement of surgical wound with placement of wound VAC  Antibiotics: Anti-infectives    Start     Dose/Rate Route Frequency Ordered Stop   11/06/16 1615  ceFAZolin (ANCEF) IVPB 1 g/50 mL premix     1 g 100 mL/hr over 30 Minutes Intravenous Every 8 hours 11/06/16 1605 11/08/16 1359   11/01/16 1800  fluconazole (DIFLUCAN) IVPB 100 mg     100 mg 50 mL/hr over 60 Minutes Intravenous Every 24 hours 11/01/16 1142 11/08/16 1759   10/31/16 1800  doxycycline (VIBRAMYCIN) 100 mg in dextrose 5 % 250 mL IVPB  Status:  Discontinued     100 mg 125 mL/hr over 120 Minutes Intravenous Every 12 hours 10/31/16 1710 11/06/16 1605   10/31/16 1800  fluconazole (DIFLUCAN) IVPB 100 mg  Status:  Discontinued     100 mg 50 mL/hr over 60 Minutes Intravenous Every 24 hours 10/31/16 1710 11/01/16 1142   10/31/16 1000  fluconazole (DIFLUCAN) tablet 100 mg  Status:   Discontinued     100 mg Oral Daily 10/30/16 1239 10/31/16 1709   10/30/16 2200  doxycycline (VIBRA-TABS) tablet 100 mg  Status:  Discontinued     100 mg Oral Every 12 hours 10/30/16 1238 10/31/16 1709   10/30/16 1400  Ampicillin-Sulbactam (UNASYN) 3 g in sodium chloride 0.9 % 100 mL IVPB  Status:  Discontinued     3 g 200 mL/hr over 30 Minutes Intravenous Every 6 hours 10/30/16 1333 11/06/16 1613   10/30/16 0800  fluconazole (DIFLUCAN) IVPB 100 mg  Status:  Discontinued     100 mg 50 mL/hr over 60 Minutes Intravenous Every 24 hours 10/29/16 0826 10/30/16 1238   10/29/16 0800  fluconazole (DIFLUCAN) IVPB 200 mg     200 mg 100 mL/hr over 60 Minutes Intravenous  Once 10/29/16 0739 10/29/16 0914   10/24/16 0600  piperacillin-tazobactam (ZOSYN) IVPB 3.375 g  Status:  Discontinued    Comments:  Zosyn 3.375 g IV q8h for CrCl > 10 mL/min   3.375 g 12.5 mL/hr over 240 Minutes Intravenous Every 8 hours 10/24/16 0130 10/30/16 1231   10/23/16 2330  doxycycline (VIBRAMYCIN) 100 mg in dextrose 5 % 250 mL IVPB  Status:  Discontinued     100 mg 125 mL/hr over 120 Minutes Intravenous Every 12 hours 10/23/16 2322 10/30/16 1231   10/23/16 2330  piperacillin-tazobactam (ZOSYN) IVPB 3.375 g  Status:  Discontinued     3.375 g 12.5 mL/hr over 240 Minutes Intravenous Every 8 hours 10/23/16 2323 10/24/16 0130      Objective: Physical Exam:  Vitals:   11/06/16 1824 11/06/16 2005 11/07/16 0019 11/07/16 0533  BP: (!) 100/58 (!) 101/58 (!) 110/55 (!) 100/49  Pulse:  (!) 57 (!) 52 60  Resp:  16 16 16   Temp:  97.8 F (36.6 C) 97.8 F (36.6 C) 98.4 F (36.9 C)  TempSrc:  Oral Oral Oral  SpO2:  100% 100% 98%  Weight:    118.5 kg (261 lb 4.8 oz)  Height:        Intake/Output Summary (Last 24 hours) at 11/07/16 1300 Last data filed at 11/07/16 1225  Gross per 24 hour  Intake              770 ml  Output              800 ml  Net              -30 ml   Filed Weights   11/04/16 0547 11/06/16 0500 11/07/16  0533  Weight: 111.7 kg (246 lb 4.1 oz) 114.8 kg (253 lb) 118.5 kg (261 lb 4.8 oz)    General: Alert, Awake and Oriented to Time, Place and Person. Appear in mild distress, affect appropriate Eyes: PERRL, Conjunctiva normal ENT: Oral Mucosa clear moist. Neck: difficult to assess JVD, no Abnormal Mass Or lumps Cardiovascular: S1 and S2 Present, no Murmur, Respiratory: Bilateral Air entry equal and Decreased, no use of accessory muscle, Clear to Auscultation, no Crackles, no wheezes Abdomen: Bowel Sound present, Soft and mild tenderness Skin: left buttock bruise from prior fall, no redness, no Rash, no induration Extremities: bilateral Pedal edema, no calf tenderness Neurologic: Grossly no focal neuro deficit. Bilaterally Equal motor strength  Data Reviewed: CBC:  Recent Labs Lab 11/04/16 0535 11/04/16 2022 11/05/16 0404 11/06/16 1718 11/07/16 0550  WBC 5.1 6.7 5.6 5.5 5.0  NEUTROABS  --   --   --   --  3.1  HGB 7.2* 8.1* 8.5* 9.0* 8.7*  HCT 21.4* 24.3* 24.9* 26.9* 25.8*  MCV 96.0 94.9 95.4 96.1 96.6  PLT 46* 54* 48* 51* 46*   Basic Metabolic Panel:  Recent Labs Lab 11/04/16 0535 11/05/16 0404 11/06/16 0320 11/06/16 1718 11/07/16 0550  NA 137 134* 137 135 136  K 3.7 3.6 4.1 3.7 3.6  CL 111 109 110 107 109  CO2 20* 18* 20* 17* 21*  GLUCOSE 129* 154* 153* 104* 154*  BUN 42* 38* 36* 36* 35*  CREATININE 1.92* 1.71* 1.75* 1.67* 1.69*  CALCIUM 8.7* 8.3* 8.3* 8.5* 8.4*  PHOS 3.2 3.3 3.4 3.2 3.5    Liver Function Tests:  Recent Labs Lab 11/04/16 0535 11/05/16 0404 11/06/16 0320 11/06/16 1718 11/07/16 0550  AST  --   --   --   --  39  ALT  --   --   --   --  18  ALKPHOS  --   --   --   --  45  BILITOT  --   --   --   --  2.7*  PROT  --   --   --   --  5.6*  ALBUMIN 2.0* 1.8* 1.6* 2.1* 1.9*   No results for input(s): LIPASE, AMYLASE in the last 168 hours. No results for input(s): AMMONIA in the last 168 hours. Coagulation Profile:  Recent Labs Lab  11/03/16 0531 11/04/16 0535 11/05/16 0404 11/06/16 0320 11/07/16 0550  INR 2.99 2.60 2.60 2.52 2.40   Cardiac Enzymes: No results for input(s): CKTOTAL, CKMB, CKMBINDEX, TROPONINI in the last 168  hours. BNP (last 3 results) No results for input(s): PROBNP in the last 8760 hours.  CBG:  Recent Labs Lab 11/06/16 1446 11/06/16 1604 11/06/16 2124 11/07/16 0625 11/07/16 1216  GLUCAP 100* 102* 122* 154* 125*    Studies: No results found.   Scheduled Meds: . brimonidine  1 drop Both Eyes Daily  .  ceFAZolin (ANCEF) IV  1 g Intravenous Q8H  . feeding supplement (GLUCERNA SHAKE)  237 mL Oral TID BM  . feeding supplement (PRO-STAT SUGAR FREE 64)  30 mL Oral TID  . fluconazole (DIFLUCAN) IV  100 mg Intravenous Q24H  . insulin aspart  0-9 Units Subcutaneous TID WC & HS  . loperamide  2 mg Oral TID  . metoCLOPramide (REGLAN) injection  5 mg Intravenous Q6H  . nadolol  20 mg Oral Daily  . nystatin   Topical BID  . pantoprazole  40 mg Oral BID  . PARoxetine  20 mg Oral Daily  . saccharomyces boulardii  250 mg Oral BID  . spironolactone  25 mg Oral Daily  . sucralfate  1 g Oral TID WC & HS   Continuous Infusions: PRN Meds: acetaminophen **OR** acetaminophen, Influenza vac split quadrivalent PF, LORazepam, methocarbamol, metoCLOPramide **OR** metoCLOPramide (REGLAN) injection, morphine injection, naLOXone (NARCAN)  injection, ondansetron **OR** ondansetron (ZOFRAN) IV, pneumococcal 23 valent vaccine, promethazine, traZODone  Time spent: 30 minutes  Author: Lynden Oxford, MD [facility] Pager: [phone] 11/07/2016 1:00 PM  If 7PM-7AM, please contact night-coverage at [email or web address], password [facility]

## 2016-11-07 NOTE — Progress Notes (Signed)
OT Treatment Progress Note  Session focused on AP transfer from recliner to bed, and lower body bathing after BM. Pt very pleasant and agreeable during session. Next session, OT goals need to be changed to account for weight bearing status change.    11/07/16 1648  OT Visit Information  Last OT Received On 11/07/16  Assistance Needed +2  History of Present Illness Pt adm with diarrhea and weakness. Pt also with nonhealing ulcer of lt foot. Pt underwent left first ray amputation. PMH - DM, rt AKA,  Precautions  Precautions Fall  Precaution Comments previous Right AKA  Pain Assessment  Pain Assessment 0-10  Pain Score 8  Pain Location R hip with mobility, and bottom  Pain Descriptors / Indicators Grimacing;Guarding;Sore  Pain Intervention(s) Monitored during session;Limited activity within patient's tolerance;Repositioned;Patient requesting pain meds-RN notified  Cognition  Arousal/Alertness Awake/alert  Behavior During Therapy WFL for tasks assessed/performed  Overall Cognitive Status Within Functional Limits for tasks assessed  ADL  Overall ADL's  Needs assistance/impaired  Lower Body Bathing Total assistance;Bed level  Lower Body Bathing Details (indicate cue type and reason) rolling for cleaning after BM  Bed Mobility  Overal bed mobility Needs Assistance  Bed Mobility Rolling;Sit to Sidelying  Rolling Min guard  Sit to sidelying Min assist  General bed mobility comments Pt able to lift LLE for AP transfer, and Pt able to roll using bed rails for cleaning, vc for safe sequence  Restrictions  Weight Bearing Restrictions Yes  RLE Weight Bearing (AKA)  LLE Weight Bearing NWB  Vision- Assessment  Vision Assessment? No apparent visual deficits  Transfers  Overall transfer level Needs assistance  Equipment used None (bed pad)  Transfers Anterior-Posterior Transfer  Anterior-Posterior transfers Max assist;+2 physical assistance  General transfer comment heavy use of bed pad to  go from chair up slightly to bed, Pt able to assist with BUE on rails; vc for technique  General Comments  General comments (skin integrity, edema, etc.) RN placing bandage on sore on R bottom  OT - End of Session  Activity Tolerance Patient tolerated treatment well  Patient left in bed;with call bell/phone within reach;with nursing/sitter in room  Nurse Communication Patient requests pain meds (RN in room with Pt putting on bandage)  OT Assessment/Plan  OT Plan Discharge plan remains appropriate  OT Visit Diagnosis Other abnormalities of gait and mobility (R26.89)  OT Frequency (ACUTE ONLY) Min 2X/week  Follow Up Recommendations SNF;Supervision/Assistance - 24 hour  OT Equipment Other (comment) (defer to next venue)  AM-PAC OT "6 Clicks" Daily Activity Outcome Measure  Help from another person eating meals? 4  Help from another person taking care of personal grooming? 3  Help from another person toileting, which includes using toliet, bedpan, or urinal? 2  Help from another person bathing (including washing, rinsing, drying)? 2  Help from another person to put on and taking off regular upper body clothing? 2  Help from another person to put on and taking off regular lower body clothing? 2  6 Click Score 15  ADL G Code Conversion CK  OT Goal Progression  Progress towards OT goals Not progressing toward goals - comment (change in WB status impacts Pt's ability to meet goals)  Acute Rehab OT Goals  Patient Stated Goal to keep LLE  OT Goal Formulation With patient  Time For Goal Achievement 11/10/16  Potential to Achieve Goals Good  OT Time Calculation  OT Start Time (ACUTE ONLY) 1449  OT Stop Time (ACUTE ONLY) 1503  OT Time Calculation (min) 14 min  OT General Charges  $OT Visit 1 Procedure  OT Treatments  $Self Care/Home Management  8-22 mins   Sherryl MangesLaura Zuma Hust OTR/L [phone]

## 2016-11-07 NOTE — Progress Notes (Signed)
   Subjective: 1 Day Post-Op Procedure(s) (LRB): IRRIGATION AND DEBRIDEMENT EXTREMITY/LEFT FOOT (Left) APPLICATION OF WOUND VAC (Left) Patient reports pain as 0 on 0-10 scale.    Objective: Vital signs in last 24 hours: Temp:  [97.3 F (36.3 C)-98.4 F (36.9 C)] 98.4 F (36.9 C) (02/20 0533) Pulse Rate:  [52-60] 60 (02/20 0533) Resp:  [13-20] 16 (02/20 0533) BP: (89-110)/(45-58) 100/49 (02/20 0533) SpO2:  [93 %-100 %] 98 % (02/20 0533) Weight:  [261 lb 4.8 oz (118.5 kg)] 261 lb 4.8 oz (118.5 kg) (02/20 0533)  Intake/Output from previous day: 02/19 0701 - 02/20 0700 In: 620 [P.O.:120; I.V.:300; IV Piggyback:200] Out: 800 [Urine:800] Intake/Output this shift: No intake/output data recorded.   Recent Labs  11/04/16 2022 11/05/16 0404 11/06/16 1718 11/07/16 0550  HGB 8.1* 8.5* 9.0* 8.7*    Recent Labs  11/06/16 1718 11/07/16 0550  WBC 5.5 5.0  RBC 2.80* 2.67*  HCT 26.9* 25.8*  PLT 51* 46*    Recent Labs  11/06/16 1718 11/07/16 0550  NA 135 136  K 3.7 3.6  CL 107 109  CO2 17* 21*  BUN 36* 35*  CREATININE 1.67* 1.69*  GLUCOSE 104* 154*  CALCIUM 8.5* 8.4*    Recent Labs  11/06/16 0320 11/07/16 0550  INR 2.52 2.40    VAC low leak rate. dressing intact.  No results found.  Assessment/Plan: 1 Day Post-Op Procedure(s) (LRB): IRRIGATION AND DEBRIDEMENT EXTREMITY/LEFT FOOT (Left) APPLICATION OF WOUND VAC (Left) Plan:  OOB NWB [place].  VAC change Wednesday then he could be transferred to SNF with Vac changes every other day.  Expected VAC use possibly 3 wks.   Eldred MangesMark C Yates 11/07/2016, 7:42 AM

## 2016-11-07 NOTE — Progress Notes (Signed)
Daily Progress Note   Patient Name: Jeffery [name]       Date: 11/07/2016 DOB: 1950/11/28  Age: 66 y.o. MRN#: [id number] Attending Physician: Rolly Salter, MD Primary Care Physician: Julian Hy, MD Admit Date: 10/23/2016  Reason for Consultation/Follow-up: Establishing goals of care  Subjective: Patient in bed, reading. No complaints. Looking forward to discharge to rehab tomorrow. His goals are to work hard in rehab and stay there no longer than 3 days. 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 We discussed hospice. He is open to hospice services when he is discharged from rehab. 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 He states he knows he is going to die, "but when death comes, it better bring an army, I'm going to lose, but I'm going to fight".   Review of Systems  All other systems reviewed and are negative.   Length of Stay: 15  Current Medications: Scheduled Meds:  . brimonidine  1 drop Both Eyes Daily  .  ceFAZolin (ANCEF) IV  1 g Intravenous Q8H  . feeding supplement (GLUCERNA SHAKE)  237 mL Oral TID BM  . feeding supplement (PRO-STAT SUGAR FREE 64)  30 mL Oral TID  . fluconazole (DIFLUCAN) IV  100 mg Intravenous Q24H  . insulin aspart  0-9 Units Subcutaneous TID WC & HS  . loperamide  2 mg Oral TID  . metoCLOPramide (REGLAN) injection  5 mg Intravenous Q6H  . nadolol  20 mg Oral Daily  . nystatin   Topical BID  . pantoprazole  40 mg Oral BID  . PARoxetine  20 mg Oral Daily  . saccharomyces boulardii  250 mg Oral BID  . spironolactone  25 mg Oral Daily  . sucralfate  1 g Oral TID WC & HS    Continuous Infusions:   PRN Meds: acetaminophen **OR**  acetaminophen, Influenza vac split quadrivalent PF, LORazepam, methocarbamol, metoCLOPramide **OR** metoCLOPramide (REGLAN) injection, morphine injection, naLOXone (NARCAN)  injection, ondansetron **OR** ondansetron (ZOFRAN) IV, pneumococcal 23 valent vaccine, promethazine, traZODone  Physical Exam  Constitutional: He is oriented to person, place, and time. He appears well-developed and well-nourished.  Cardiovascular: Normal rate and regular rhythm.   Pulmonary/Chest: Effort normal and breath sounds  normal.  Abdominal: He exhibits distension.  Musculoskeletal:  Generalized weakness   Neurological: He is alert and oriented to person, place, and time.  Skin:  jaundice  Psychiatric: He has a normal mood and affect. His behavior is normal. Judgment and thought content normal.            Vital Signs: BP (!) 116/56 (BP Location: Left Arm)   Pulse 60   Temp 98.4 F (36.9 C) (Oral)   Resp 17   Ht 6\' 1"  (1.854 m)   Wt 118.5 kg (261 lb 4.8 oz)   SpO2 98%   BMI 34.47 kg/m  SpO2: SpO2: 98 % O2 Device: O2 Device: Not Delivered O2 Flow Rate: O2 Flow Rate (L/min): 6 L/min  Intake/output summary:   Intake/Output Summary (Last 24 hours) at 11/07/16 1558 Last data filed at 11/07/16 1507  Gross per 24 hour  Intake              706 ml  Output              800 ml  Net              -94 ml   LBM: Last BM Date: 11/05/16 Baseline Weight: Weight: 104.4 kg (230 lb 1.6 oz) Most recent weight: Weight: 118.5 kg (261 lb 4.8 oz)       Palliative Assessment/Data: PPS: 40%   Flowsheet Rows   Flowsheet Row Most Recent Value  Intake Tab  Referral Department  Hospitalist  Unit at Time of Referral  Med/Surg Unit  Palliative Care Primary Diagnosis  Other (Comment) [cirrhosis]  Date Notified  10/29/16  Palliative Care Type  New Palliative care  Reason for referral  Advance Care Planning, Clarify Goals of Care  Date of Admission  10/23/16  Date first seen by Palliative Care  10/31/16  # of days IP  prior to Palliative referral  6  Clinical Assessment  Palliative Performance Scale Score  20%  Psychosocial & Spiritual Assessment  Social Work Plan of Care  Clarified patient/family wishes with healthcare team, Advance care planning  Palliative Care Outcomes  Patient/Family meeting held?  Yes  Who was at the meeting?  Patient  Palliative Care Outcomes  Improved non-pain symptom therapy, Clarified goals of care, Changed CPR status      Patient Active Problem List   Diagnosis Date Noted  . Multiple duodenal ulcers   . Ulcer of esophagus without bleeding   . Melena   . Chronic liver failure without hepatic coma (HCC)   . Advance care planning   . Goals of care, counseling/discussion   . Palliative care by specialist   . End stage liver disease (HCC)   . Moderate single current episode of major depressive disorder (HCC)   . Anxiety state   . Diaper candidiasis   . Somnolence   . Moderate malnutrition (HCC)   . Noninfectious gastroenteritis   . Gastrointestinal hemorrhage   . Controlled diabetes mellitus type 2 with complications (HCC)   . Osteomyelitis of left foot (HCC)   . Acute renal failure (HCC)   . Abdominal pain   . Diarrhea   . Osteomyelitis (HCC)   . C. difficile colitis 10/23/2016  . AKI (acute kidney injury) (HCC)   . Weakness   . Gastrointestinal hemorrhage with melena   . Idiopathic hypotension   . Other hyperlipidemia   . Esophageal varices without bleeding (HCC)   . Portal hypertensive gastropathy   . Cirrhosis of liver with ascites (  HCC) 05/12/2016  . Special screening for malignant neoplasms, colon 05/12/2016  . Coagulopathy (HCC) 04/28/2016  . Hemolytic anemia (HCC) 03/24/2016  . G6PD deficiency anemia (HCC) 03/24/2016  . Thrombocytopenia (HCC) 03/24/2016  . Generalized psoriasis 03/20/2016  . Gilbert syndrome 03/20/2016  . Pancytopenia, acquired (HCC) 03/20/2016  . Hematemesis without nausea 03/20/2016    Palliative Care Assessment & Plan    Patient Profile: 66 y.o. male  with past medical history of cirrhosis, ascites, esophageal varices, portal hypertensive gastropathy, hemolytic anemia, thrombocytopenia, BKA for DM foot infection asthma, admitted on 10/23/2016 with abdominal pain and diarrhea. C-diff negative. Workup reveals end stage liver disease (PT 31, Albumin 1.5, refractory ascites), osteomyelitis of L big toe (s/t debridement and amputation during this admission) and AKI. N/V resolved. Has poor wound healing requiring wound vac placement.   Assessment/Recommendations/Plan   DNR  Continue current level of care  Patient is eligible for Hospice services based on end stage liver disease algorithm:   PT >5 (33); INR > (1.5) AND Serum albumin <2.5gm (1.5) AND refractory ascites  D/C to rehab- plan to transition to Hospice after rehab  Code Status:  DNR  Prognosis:  < 6 months in setting of end stage liver disease, osteomyelitis, poor wound healing, AKI Discharge Planning:  SNF for rehab, then Home with Hospice  Care plan was discussed with patient.  Thank you for allowing the Palliative Medicine Team to assist in the care of this patient.   Total time: 35 minutes  Greater than 50%  of this time was spent counseling and coordinating care related to the above assessment and plan.  Ocie Bob, AGNP-C Palliative Medicine   Please contact Palliative Medicine Team phone at [phone] for questions and concerns.

## 2016-11-08 DIAGNOSIS — M86172 Other acute osteomyelitis, left ankle and foot: Secondary | ICD-10-CM

## 2016-11-08 LAB — GLUCOSE, CAPILLARY
GLUCOSE-CAPILLARY: 135 mg/dL — AB (ref 65–99)
Glucose-Capillary: 135 mg/dL — ABNORMAL HIGH (ref 65–99)

## 2016-11-08 LAB — RENAL FUNCTION PANEL
ANION GAP: 7 (ref 5–15)
Albumin: 2 g/dL — ABNORMAL LOW (ref 3.5–5.0)
BUN: 32 mg/dL — ABNORMAL HIGH (ref 6–20)
CALCIUM: 8.2 mg/dL — AB (ref 8.9–10.3)
CHLORIDE: 107 mmol/L (ref 101–111)
CO2: 19 mmol/L — AB (ref 22–32)
Creatinine, Ser: 1.63 mg/dL — ABNORMAL HIGH (ref 0.61–1.24)
GFR calc non Af Amer: 42 mL/min — ABNORMAL LOW (ref >60)
GFR, EST AFRICAN AMERICAN: 49 mL/min — AB (ref >60)
GLUCOSE: 135 mg/dL — AB (ref 65–99)
POTASSIUM: 3.4 mmol/L — AB (ref 3.5–5.1)
Phosphorus: 3.3 mg/dL (ref 2.5–4.6)
SODIUM: 133 mmol/L — AB (ref 135–145)

## 2016-11-08 LAB — CBC
HEMATOCRIT: 24.2 % — AB (ref 39.0–52.0)
HEMOGLOBIN: 8.1 g/dL — AB (ref 13.0–17.0)
MCH: 32.5 pg (ref 26.0–34.0)
MCHC: 33.5 g/dL (ref 30.0–36.0)
MCV: 97.2 fL (ref 78.0–100.0)
Platelets: 41 10*3/uL — ABNORMAL LOW (ref 150–400)
RBC: 2.49 MIL/uL — AB (ref 4.22–5.81)
RDW: 19 % — ABNORMAL HIGH (ref 11.5–15.5)
WBC: 3.7 10*3/uL — ABNORMAL LOW (ref 4.0–10.5)

## 2016-11-08 LAB — PROTIME-INR
INR: 2.27
Prothrombin Time: 25.4 seconds — ABNORMAL HIGH (ref 11.4–15.2)

## 2016-11-08 MED ORDER — OXYCODONE HCL 5 MG PO TABS: 5.0000 mg | ORAL_TABLET | ORAL | 0 refills | Status: DC | PRN

## 2016-11-08 NOTE — Progress Notes (Signed)
Report given to Pecola LawlessFisher Park, RN.

## 2016-11-08 NOTE — Clinical Social Work Note (Addendum)
Clinical Social Worker facilitated patient discharge including contacting patient family and facility to confirm patient discharge plans.  Clinical information faxed to facility and family agreeable with plan.  CSW arranged [facility]3:30) [facility]  RN to call [phone] for report prior to discharge. Patient will go to room 109.   Clinical Social Worker will sign off for now as social work intervention is no longer needed. Please consult us again if new need arises.  [address] StreetBridget [name], ConnecticutLCSWA [phone]

## 2016-11-08 NOTE — Progress Notes (Signed)
   Subjective: 2 Days Post-Op Procedure(s) (LRB): IRRIGATION AND DEBRIDEMENT EXTREMITY/LEFT FOOT (Left) APPLICATION OF WOUND VAC (Left) Patient reports pain as 0 on 0-10 scale. Neuropathy.   Objective: Vital signs in last 24 hours: Temp:  [98.3 F (36.8 C)-98.6 F (37 C)] 98.3 F (36.8 C) (02/21 0604) Pulse Rate:  [60-63] 60 (02/21 0604) Resp:  [17-18] 18 (02/21 0604) BP: (97-116)/(47-56) 112/48 (02/21 0604) SpO2:  [98 %-100 %] 100 % (02/21 0604)  Intake/Output from previous day: 02/20 0701 - 02/21 0700 In: 676 [P.O.:676] Out: 701 [Urine:700; Stool:1] Intake/Output this shift: No intake/output data recorded.   Recent Labs  11/06/16 1718 11/07/16 0550 11/08/16 0446  HGB 9.0* 8.7* 8.1*    Recent Labs  11/07/16 0550 11/08/16 0446  WBC 5.0 3.7*  RBC 2.67* 2.49*  HCT 25.8* 24.2*  PLT 46* 41*    Recent Labs  11/07/16 0550 11/08/16 0446  NA 136 133*  K 3.6 3.4*  CL 109 107  CO2 21* 19*  BUN 35* 32*  CREATININE 1.69* 1.63*  GLUCOSE 154* 135*  CALCIUM 8.4* 8.2*    Recent Labs  11/07/16 0550 11/08/16 0446  INR 2.40 2.27    VAC seal good.  No results found.  Assessment/Plan: 2 Days Post-Op Procedure(s) (LRB): IRRIGATION AND DEBRIDEMENT EXTREMITY/LEFT FOOT (Left) APPLICATION OF WOUND VAC (Left) VAC change. SNF per medical team.   Eldred MangesMark C Shayleigh Bouldin 11/08/2016, 7:34 AM

## 2016-11-08 NOTE — Discharge Summary (Signed)
[facility] Discharge Summary   Patient: Jeffery [name] GNF:[id number]   PCP: Julian Hy, MD DOB: 1951-02-11   Date of admission: 10/23/2016   Date of discharge:  11/08/2016    Discharge Diagnoses:  Principal Problem:   Osteomyelitis of left foot (HCC) Active Problems:   Hemolytic anemia (HCC)   Cirrhosis of liver with ascites (HCC)   Esophageal varices without bleeding (HCC)   Portal hypertensive gastropathy   C. difficile colitis   AKI (acute kidney injury) (HCC)   Diarrhea   Noninfectious gastroenteritis   Gastrointestinal hemorrhage   Controlled diabetes mellitus type 2 with complications (HCC)   Anxiety state   Somnolence   Moderate malnutrition (HCC)   Multiple duodenal ulcers   Ulcer of esophagus without bleeding   Melena   Chronic liver failure without hepatic coma (HCC)   Advance care planning   Goals of care, counseling/discussion   Palliative care by specialist   End stage liver disease (HCC)   Cirrhosis of liver (HCC)   Admitted From: home Disposition:  SNF  Recommendations for Outpatient Follow-up:  1. Follow-up with PCP in one week, Dr. Ophelia Charter in 1 week, GI in 2 weeks. 2. May require repeat paracentesis in one week. 3. Check CBC and CMP in one week  4. Wound VAC change Monday Wednesday Friday. Duration of wound VAC 3 weeks 5. Palliative medicine to follow at skilled nursing facility   Contact information for follow-up providers    Eldred Manges, MD. Schedule an appointment as soon as possible for a visit today.   Specialty:  Orthopedic Surgery Why:  needs follow up appointment one week after discharge from hospital.  Contact information: [address] [place] [address] [phone]        Julian Hy, MD. Schedule an appointment as soon as possible for a visit in 1 week(s).   Specialty:  Endocrinology Contact information: [address] [place] [address] [phone]        Rachael Fee, MD.  Schedule an appointment as soon as possible for a visit in 2 week(s).   Specialty:  Gastroenterology Contact information: [address] [place] [address] [phone]            Contact information for after-discharge care    Destination    [facility] SN[facility] Follow up.   Specialty:  Skilled Nursing Facility Contact information: [address] [place] [address] [phone]                 Diet recommendation: Cardiac diet  Activity: The patient is advised to gradually reintroduce usual activities.  Discharge Condition: good  Code Status: DNR/DNI  History of present illness: As per the H and P dictated on admission, "Jeffery [name] is a 66 y.o. male with medical history significant of liver cirrhosis with asxcities, esophageal varices, portal hypertensive gastropathy, hemolytic anemia, thrombocytopenia, status BKA for diabetic foot infection presented to the emergency room with complaints of abdominal pain and frequent diarrhea. Patient has been tested for hepatitis and screening was negative. He is not an alcoholic and has no previous history of heavy alcohol use. During the last few weeks he's been having frequent loose stools that were worsening in frequency over the last 2 - 3 days  to the point that he is unable to eat because food intake causes him to run to the bathroom continuously and .he has become severely weak. He also c/o having nausea and dry heaves, feeling  of fullness in the stomack that despite diarrhea is not getting better, but feeling worse. Patient also reported of becoming very emotional and crying a lot without any apparent reason  He has been treated with tetracycline ( now the second round) for nearly 4 weeks for non healing diabetic ulcer of the left foot and erythema is still persent and he dose not feel that the wound is healing, although erythema has improved"  Hospital Course:   Summary of  his active problems in the hospital is as following. 1. Persistent diarrhea with nausea, resolved. Presenting symptom C. difficile negative. FOBT positive. GI pathogen panel negative CT scan shows pancolitis but more likely hypoalbuminemia causing wall thickening. GI has been consulted. On probiotic,  scheduled imodium.Changed to when necessary. Also on scheduled Reglan, change to when necessary  2. Left toe osteomyelitis. Sepsis present on admission currently resolved S/P first ray amputation. Pathology report shows clean margins. Discuss with infectious disease, recommended to complete 14 days of antibiotics. Patient was on doxycycline and IV Zosyn. Cultures negative. change to IV doxycycline and IV Unasyn while in the hospital. Probiotics added. Last day of antibiotic 11/07/2016. Patient underwent washout of the wound and wound VAC placement 11/06/2016. VAC change in the hospital on 11/08/2016 and then patient discharged to SNF.  3. Cirrhosis of the liver. Portal hypertensive gastropathy, esophageal varices. Duodenal ulcers hematemesis as well as melena. Acute blood loss anemia GI consulted, appreciate input. Nadolol dose has been reduced from 40 mg with 20 mg due to hypotension and acute kidney injury. Hypoalbuminemia, low fibrinogen level, elevated INR and chronic thrombocytopenia all secondary to cirrhosis. S/P paracentesis 10/24/2016 with 3 L of fluid removed. No evidence of SBP. S/P EGD; on protonix, given IV diflucan, gastroenterology initiated on vitamin K 10 mg 3, no significant change in INR despite that. Large volume ascites still present, may need taping next week. Per GI No indication to perform paracentesis at present. Continue 50 mg Aldactone and Lasix. Patient also seen by palliative medicine due to end-stage liver disease. Patient is now DO NOT RESUSCITATE. Plan is for him to go to SNF for rehabilitation and then home with hospice.  4. chronic  thrombocytopenia Platelet has been low since last one year. Likely in the setting of liver disease. Elevated INR and low fibrinogen due to liver disease as well. I highly doubt presence of DIC TTP at present. 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 Peripheral smear shows leukocytes without any schistocytes. Stop aspirin and use mechanical prophylaxis for DVT. Platelet remains low after transfusion, due to cirrhosis or sepsis associated ITP.  5. Acute kidney injury. Seems to improve after blood transfusion. Will monitor with daily weight and strict IN and OUT. Baseline renal function 1.3, on presentation 2.49, now stable, probably multifactorial Due to hypotension and dehydration from diarrhea. Unclear whether surgery as well as paracentesis has any role to play in persistence of renal dysfunction. US renal negative. Reduce the dose of nadolol. Given IV albumin.  6. Type 2 diabetes mellitus. Continue sliding scale insulin at the nursing home. CBG before meals at bedtime A1c 5.8 on admission.  All other chronic medical condition were stable during the hospitalization.  Patient was seen by physical therapy, who recommended SNF, which was arranged by Child psychotherapistsocial worker and case Production designer, theatre/television/filmmanager.  Patient seen and examined this morning. Denies any complaints except for his distended abdomen. Denies any shortness of breath. Overall feeling well. Waiting for his wound VAC to be changed and then he  could be discharged to skilled nursing facility.  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   PRBC transfusion.  EGD.  Platelet transfusion 1  Consultations:  GI, orthopedics, palliative care  DISCHARGE MEDICATION: Current Discharge Medication List    START taking these medications   Details  Amino Acids-Protein Hydrolys (FEEDING SUPPLEMENT, PRO-STAT SUGAR FREE 64,) LIQD Take 30 mLs by  mouth 3 (three) times daily. Qty: 900 mL, Refills: 0    feeding supplement, GLUCERNA SHAKE, (GLUCERNA SHAKE) LIQD Take 237 mLs by mouth 3 (three) times daily between meals. Qty: 21 Can, Refills: 0    furosemide (LASIX) 20 MG tablet Take 1 tablet (20 mg total) by mouth daily. Qty: 30 tablet, Refills: 0    insulin aspart (NOVOLOG) 100 UNIT/ML injection Inject 0-9 Units into the skin 4 (four) times daily -  with meals and at bedtime. Qty: 10 mL, Refills: 11    loperamide (IMODIUM) 2 MG capsule Take 1 capsule (2 mg total) by mouth as needed for diarrhea or loose stools. Qty: 30 capsule, Refills: 0    methocarbamol (ROBAXIN) 500 MG tablet Take 0.5 tablets (250 mg total) by mouth every 8 (eight) hours as needed for muscle spasms. Qty: 15 tablet, Refills: 0    metoCLOPramide (REGLAN) 5 MG tablet Take 1 tablet (5 mg total) by mouth every 6 (six) hours as needed for nausea. Qty: 20 tablet, Refills: 0    nystatin (MYCOSTATIN/NYSTOP) powder Apply topically 2 (two) times daily. Qty: 15 g, Refills: 0    oxyCODONE (OXY IR/ROXICODONE) 5 MG immediate release tablet Take 1 tablet (5 mg total) by mouth every 4 (four) hours as needed for severe pain. Qty: 10 tablet, Refills: 0    pantoprazole (PROTONIX) 40 MG tablet Take 1 tablet (40 mg total) by mouth 2 (two) times daily. Qty: 60 tablet, Refills: 0    PARoxetine (PAXIL) 20 MG tablet Take 1 tablet (20 mg total) by mouth daily. Qty: 30 tablet, Refills: 0    saccharomyces boulardii (FLORASTOR) 250 MG capsule Take 1 capsule (250 mg total) by mouth 2 (two) times daily. Qty: 10 capsule, Refills: 0    spironolactone (ALDACTONE) 25 MG tablet Take 2 tablets (50 mg total) by mouth daily. Qty: 30 tablet, Refills: 0    sucralfate (CARAFATE) 1 GM/10ML suspension Take 10 mLs (1 g total) by mouth 4 (four) times daily -  with meals and at bedtime. Qty: 420 mL, Refills: 0      CONTINUE these medications which have CHANGED   Details  nadolol (CORGARD) 20 MG  tablet Take 1 tablet (20 mg total) by mouth daily. Qty: 30 tablet, Refills: 0      CONTINUE these medications which have NOT CHANGED   Details  atorvastatin (LIPITOR) 40 MG tablet Take 40 mg by mouth daily. Refills: 2    brimonidine (ALPHAGAN P) 0.1 % SOLN Place 1 drop into both eyes daily.     Multiple Vitamin (MULTIVITAMIN WITH MINERALS) TABS tablet Take 1 tablet by mouth daily.      STOP taking these medications     aspirin 81 MG tablet      doxycycline (VIBRA-TABS) 100 MG tablet      losartan (COZAAR) 50 MG tablet        No Known Allergies Discharge Instructions    Diet - low sodium heart healthy    Complete by:  As directed  Discharge instructions    Complete by:  As directed    It is important that you read following instructions as well as go over your medication list with RN to help you understand your care after this hospitalization.  Discharge Instructions: Please follow-up with PCP in one week  Please request your primary care physician to go over all Hospital Tests and Procedure/Radiological results at the follow up,  Please get all Hospital records sent to your PCP by signing hospital release before you go home.   Do not take more than prescribed Pain, Sleep and Anxiety Medications. You were cared for by a hospitalist during your hospital stay. If you have any questions about your discharge medications or the care you received while you were in the hospital after you are discharged, you can call the unit and ask to speak with the hospitalist on call if the hospitalist that took care of you is not available.  Once you are discharged, your primary care physician will handle any further medical issues. Please note that NO REFILLS for any discharge medications will be authorized once you are discharged, as it is imperative that you return to your primary care physician (or establish a relationship with a primary care physician if you do not have one) for your  aftercare needs so that they can reassess your need for medications and monitor your lab values. You Must read complete instructions/literature along with all the possible adverse reactions/side effects for all the Medicines you take and that have been prescribed to you. Take any new Medicines after you have completely understood and accept all the possible adverse reactions/side effects. Wear Seat belts while driving. If you have smoked or chewed Tobacco in the last 2 yrs please stop smoking and/or stop any Recreational drug use.   Increase activity slowly    Complete by:  As directed      Discharge Exam: Filed Weights   11/04/16 0547 11/06/16 0500 11/07/16 0533  Weight: 111.7 kg (246 lb 4.1 oz) 114.8 kg (253 lb) 118.5 kg (261 lb 4.8 oz)   Vitals:   11/07/16 2132 11/08/16 0604  BP: (!) 97/47 (!) 112/48  Pulse: 63 60  Resp: 18 18  Temp: 98.6 F (37 C) 98.3 F (36.8 C)   General: Appear in no distress, multiple bruises on the back and on leg Cardiovascular: S1, S2 is normal, regular. No S3, S4. No rubs, murmurs, or bruit Respiratory: Clear to auscultation bilaterally Abdomen: Abdomen is distended. Ascites is present. Nontender. Neurology: Grossly no focal neuro deficit.  The results of significant diagnostics from this hospitalization (including imaging, microbiology, ancillary and laboratory) are listed below for reference.    Significant Diagnostic Studies: Ct Abdomen Pelvis Wo Contrast  Result Date: 10/23/2016 CLINICAL DATA:  Abdominal pain with diarrhea for 2-3 days EXAM: CT ABDOMEN AND PELVIS WITHOUT CONTRAST TECHNIQUE: Multidetector CT imaging of the abdomen and pelvis was performed following the standard protocol without IV contrast. COMPARISON:  None. FINDINGS: Lower chest: No acute abnormality. Hepatobiliary: Liver is diminutive in size with a nodular contour consistent with cirrhosis. Large volume abdominal ascites. Cholelithiasis without biliary dilatation. Pancreas:  Unremarkable. No pancreatic ductal dilatation or surrounding inflammatory changes. Spleen: Normal in size without focal abnormality. Adrenals/Urinary Tract: Normal adrenal glands. No urolithiasis with or obstructive uropathy. 1.7 x 1.9 cm hypodense, fluid attenuating right interpolar renal mass most consistent with a cyst. 5.4 x 4.8 cm hypodense, fluid attenuating left renal mass most consistent with a cyst. 13 x 9 mm  hyperdense exophytic left posterior interpolar renal mass likely reflecting a hemorrhagic or proteinaceous cyst but is technically indeterminate. Normal bladder. Stomach/Bowel: No bowel dilatation. Mild relative bowel wall thickening throughout the colon which may reflect reactive edema secondary to ascites versus mild colitis. No pneumatosis, pneumoperitoneum or portal venous gas. Normal appendix. Vascular/Lymphatic: Normal caliber abdominal aorta with atherosclerosis. No lymphadenopathy. Reproductive: Prostate is unremarkable. Other: No other fluid collection or hematoma. Musculoskeletal: No lytic or sclerotic osseous lesion. Diffuse thoracolumbar spine spondylosis. IMPRESSION: 1. Cirrhosis with large volume ascites. 2. Mild relative bowel wall thickening throughout the colon which may reflect reactive edema secondary to ascites versus mild colitis. 3. Cholelithiasis. 4. Bilateral renal cysts. Electronically Signed   By: Elige Ko   On: 10/23/2016 21:14   US Renal  Result Date: 10/31/2016 CLINICAL DATA:  Acute kidney injury. EXAM: RENAL / URINARY TRACT ULTRASOUND COMPLETE COMPARISON:  Ultrasound of September 01, 2016. FINDINGS: Right Kidney: Length: 13.2 cm. Echogenicity within normal limits. No mass or hydronephrosis visualized. Left Kidney: Length: 13.5 cm. 5.2 cm simple cyst is seen in lower pole. 2.3 cm simple cyst is seen in upper pole. Echogenicity within normal limits. No mass or hydronephrosis visualized. Bladder: Appears normal for degree of bladder distention. Ascites is noted.  IMPRESSION: Ascites.  Simple left renal cysts.  No other abnormality seen. Electronically Signed   By: Lupita Raider, M.D.   On: 10/31/2016 09:37   Mr Foot Left Wo Contrast  Result Date: 10/24/2016 CLINICAL DATA:  Nonhealing ulcer at the ball of the foot below were the great toe use to be. Query osteomyelitis. EXAM: MRI OF THE LEFT FOOT WITHOUT CONTRAST TECHNIQUE: Multiplanar, multisequence MR imaging of the left forefoot was performed. No intravenous contrast was administered. COMPARISON:  CT left lower extremity 04/06/2010 FINDINGS: Bones/Joint/Cartilage Previous amputation of the great toe at the base of the proximal phalanx. Somewhat irregular appearance of the first metatarsal head with increased marrow signal intensity in the distal first metatarsal head as well as in the remaining proximal phalangeal stump. Focal subperiosteal signal intensity along the metatarsal hand. Changes suggest focal osteomyelitis. Scattered marrow signal intensity changes in the intertarsal bones probably represent degenerative change. Hammertoe deformities. Muscles and Tendons Visualized flexor and extensor tendons appear intact with normal signal intensity. Edema in the plantar musculature of the forefoot suggesting inflammatory change. Soft tissues Subcutaneous soft tissue edema around the first metatarsal head and stump with focal ulceration inferior and lateral to the metatarsal head suggesting cellulitis. No loculated fluid collection to suggest abscess. IMPRESSION: Previous amputation of the right first toe. Soft tissue edema consistent with cellulitis and ulceration at the stomach and over the first metatarsal head. Edema in the plantar musculature. Focal bone erosion and marrow signal changes at the distal first metatarsal head and involving the proximal phalangeal stump likely indicates focal osteomyelitis. Electronically Signed   By: Burman Nieves M.D.   On: 10/24/2016 06:28   US Paracentesis  Result Date:  10/24/2016 INDICATION: New onset abdominal pain with new findings of ascites. Request is made for diagnostic and therapeutic paracentesis. EXAM: ULTRASOUND GUIDED DIAGNOSTIC AND THERAPEUTIC PARACENTESIS MEDICATIONS: 1% lidocaine. COMPLICATIONS: None immediate. PROCEDURE: Informed written consent was obtained from the patient after a discussion of the risks, benefits and alternatives to treatment. A timeout was performed prior to the initiation of the procedure. Initial ultrasound scanning demonstrates a small amount of ascites within the right lower abdominal quadrant. The right lower abdomen was prepped and draped in the usual sterile fashion. 1%  lidocaine was used for local anesthesia. Following this, a 19 gauge, 7-cm, Yueh catheter was introduced. An ultrasound image was saved for documentation purposes. The paracentesis was performed. The catheter was removed and a dressing was applied. The patient tolerated the procedure well without immediate post procedural complication. FINDINGS: A total of approximately 3.3 L of yellow fluid was removed. Samples were sent to the laboratory as requested by the clinical team. IMPRESSION: Successful ultrasound-guided paracentesis yielding 3.3 liters of peritoneal fluid. Read by: Barnetta Chapel, PA-C Electronically Signed   By: Simonne Come M.D.   On: 10/24/2016 11:23   Dg Abd Portable 1v  Result Date: 10/30/2016 CLINICAL DATA:  Nausea and vomiting. Cholelithiasis and hypertension. EXAM: PORTABLE ABDOMEN - 1 VIEW COMPARISON:  CT abdomen from 10/23/2016 FINDINGS: Moderate gaseous distention of the stomach possibly representing gastroparesis. Scattered air containing nonobstructed, nondistended large bowel. No significant small bowel dilatation. Calcification in the left upper quadrant is noted not clearly identified on the prior CT. It does not appear to represent nephrolithiasis. No definite calcified lymph node is seen on prior CT. Splenic vascular calcification might account  for this. Mild degenerate change of the dorsal spine. IMPRESSION: Moderate gaseous distention of the stomach which may represent gastroparesis. No obstructive bowel gas pattern. No definite free air. Electronically Signed   By: Tollie Eth M.D.   On: 10/30/2016 20:24   Dg Foot 2 Views Left  Result Date: 10/24/2016 CLINICAL DATA:  Osteomyelitis EXAM: LEFT FOOT - 2 VIEW COMPARISON:  MRI 10/24/2016 FINDINGS: Changes of prior amputation of the left great toe. Lucency/irregularity within the distal aspect of the left first metatarsal may reflect area of bone destruction and osteomyelitis. No additional acute bony abnormality. Prior internal fixation of an old healed fifth metatarsal fracture. IMPRESSION: Lucency in the distal left first metatarsal, possibly area of osteomyelitis. Electronically Signed   By: Charlett Nose M.D.   On: 10/24/2016 17:44   US Abdomen Limited Ruq  Result Date: 11/02/2016 CLINICAL DATA:  Hepatic cirrhosis. EXAM: US ABDOMEN LIMITED - RIGHT UPPER QUADRANT COMPARISON:  Ultrasound of October 31, 2016. FINDINGS: Gallbladder: 1.5 cm gallstone is noted. No gallbladder wall thickening or pericholecystic fluid is noted. No sonographic Murphy's sign is noted. Common bile duct: Not visualized. Liver: Liver is small with nodular margins consistent with hepatic cirrhosis. Ascites is noted. IMPRESSION: Ascites. Solitary gallstone without gallbladder wall thickening or pericholecystic fluid. Findings consistent with hepatic cirrhosis. Electronically Signed   By: Lupita Raider, M.D.   On: 11/02/2016 21:47    Microbiology: Recent Results (from the past 240 hour(s))  Gastrointestinal Panel by PCR , Stool     Status: None   Collection Time: 10/30/16  3:18 PM  Result Value Ref Range Status   Campylobacter species NOT DETECTED NOT DETECTED Final   Plesimonas shigelloides NOT DETECTED NOT DETECTED Final   Salmonella species NOT DETECTED NOT DETECTED Final   Yersinia enterocolitica NOT DETECTED NOT  DETECTED Final   Vibrio species NOT DETECTED NOT DETECTED Final   Vibrio cholerae NOT DETECTED NOT DETECTED Final   Enteroaggregative E coli (EAEC) NOT DETECTED NOT DETECTED Final   Enteropathogenic E coli (EPEC) NOT DETECTED NOT DETECTED Final   Enterotoxigenic E coli (ETEC) NOT DETECTED NOT DETECTED Final   Shiga like toxin producing E coli (STEC) NOT DETECTED NOT DETECTED Final   Shigella/Enteroinvasive E coli (EIEC) NOT DETECTED NOT DETECTED Final   Cryptosporidium NOT DETECTED NOT DETECTED Final   Cyclospora cayetanensis NOT DETECTED NOT DETECTED Final  Entamoeba histolytica NOT DETECTED NOT DETECTED Final   Giardia lamblia NOT DETECTED NOT DETECTED Final   Adenovirus F40/41 NOT DETECTED NOT DETECTED Final   Astrovirus NOT DETECTED NOT DETECTED Final   Norovirus GI/GII NOT DETECTED NOT DETECTED Final   Rotavirus A NOT DETECTED NOT DETECTED Final   Sapovirus (I, II, IV, and V) NOT DETECTED NOT DETECTED Final     Labs: CBC:  Recent Labs Lab 11/04/16 2022 11/05/16 0404 11/06/16 1718 11/07/16 0550 11/08/16 0446  WBC 6.7 5.6 5.5 5.0 3.7*  NEUTROABS  --   --   --  3.1  --   HGB 8.1* 8.5* 9.0* 8.7* 8.1*  HCT 24.3* 24.9* 26.9* 25.8* 24.2*  MCV 94.9 95.4 96.1 96.6 97.2  PLT 54* 48* 51* 46* 41*   Basic Metabolic Panel:  Recent Labs Lab 11/05/16 0404 11/06/16 0320 11/06/16 1718 11/07/16 0550 11/08/16 0446  NA 134* 137 135 136 133*  K 3.6 4.1 3.7 3.6 3.4*  CL 109 110 107 109 107  CO2 18* 20* 17* 21* 19*  GLUCOSE 154* 153* 104* 154* 135*  BUN 38* 36* 36* 35* 32*  CREATININE 1.71* 1.75* 1.67* 1.69* 1.63*  CALCIUM 8.3* 8.3* 8.5* 8.4* 8.2*  PHOS 3.3 3.4 3.2 3.5 3.3   Liver Function Tests:  Recent Labs Lab 11/05/16 0404 11/06/16 0320 11/06/16 1718 11/07/16 0550 11/08/16 0446  AST  --   --   --  39  --   ALT  --   --   --  18  --   ALKPHOS  --   --   --  45  --   BILITOT  --   --   --  2.7*  --   PROT  --   --   --  5.6*  --   ALBUMIN 1.8* 1.6* 2.1* 1.9* 2.0*    CBG:  Recent Labs Lab 11/07/16 1216 11/07/16 1834 11/07/16 2128 11/08/16 0626 11/08/16 1107  GLUCAP 125* 150* 138* 135* 135*   Time spent: 30 minutes  Signed:  Lyon Dumont  [facility]  11/08/2016  , 11:40 AM

## 2016-11-08 NOTE — Consult Note (Signed)
WOC Nurse wound consult note Reason for Consult: NPWT VAC dressing change prior to DC to SNF Wound type: surgical s/p debridement  Pressure Injury POA: No Measurement:7cm x 3.5cm x 3.0cm  Wound bed: clean, non granular  Drainage (amount, consistency, odor) sanguinous with dressing change Periwound: slightly macerated today and noted to have area of indentation from the VAC tubing that was against his leg  Dressing procedure/placement/frequency: Window framed the periwound today with VAC drape, because the skin was so fragile and slightly macerated. Protect linear area just proximal to the wound with hydrocolloid 1pc of black foam used to fill the wound bed.   Gauze bolsters placed under the trac pad tubing on the lateral foot and ankle. kerlix and ACE wrap over VAC dressing to maintain dressing.  I did not bridge TRAC pad to the top of the foot today, patient has been made NWB on this foot.   [facility] upon arrival today.  Instructed bedside nurse to clamp off current dressing for transport and the facility can attach to NPWT device when he arrives.  Discussed POC with patient and bedside nurse.  Re consult if needed, will not follow at this time. Thanks  Kanishk Stroebel [employer] MSN, RN,CWOCN, CNS [phone])

## 2016-11-08 NOTE — Discharge Instructions (Signed)
Cirrhosis °Cirrhosis is long-term (chronic) liver injury. The liver is your largest internal organ, and it performs many functions. The liver converts food into energy, removes toxic material from your blood, makes important proteins, and absorbs necessary vitamins from your diet. °If you have cirrhosis, it means many of your healthy liver cells have been replaced by scar tissue. This prevents blood from flowing through your liver, which makes it difficult for your liver to function. This scarring is not reversible, but treatment can prevent it from getting worse. °What are the causes? °Hepatitis C and long-term alcohol abuse are the most common causes of cirrhosis. Other causes include: °· Nonalcoholic fatty liver disease. °· Hepatitis B infection. °· Autoimmune hepatitis. °· Diseases that cause blockage of ducts inside the liver. °· Inherited liver diseases. °· Reactions to certain long-term medicines. °· Parasitic infections. °· Long-term exposure to certain toxins. °What increases the risk? °You may have a higher risk of cirrhosis if you: °· Have certain hepatitis viruses. °· Abuse alcohol, especially if you are male. °· Are overweight. °· Share needles. °· Have unprotected sex with someone who has hepatitis. °What are the signs or symptoms? °You may not have any signs and symptoms at first. Symptoms may not develop until the damage to your liver starts to get worse. Signs and symptoms of cirrhosis may include: °· Tenderness in the right-upper part of your abdomen. °· Weakness and tiredness (fatigue). °· Loss of appetite. °· Nausea. °· Weight loss and muscle loss. °· Itchiness. °· Yellow skin and eyes (jaundice). °· Buildup of fluid in the abdomen (ascites). °· Swelling of the feet and ankles (edema). °· Appearance of tiny blood vessels under the skin. °· Mental confusion. °· Easy bruising and bleeding. °How is this diagnosed? °Your health care provider may suspect cirrhosis based on your symptoms and medical  history, especially if you have other medical conditions or a history of alcohol abuse. Your health care provider will do a physical exam to feel your liver and check for signs of cirrhosis. Your health care provider may perform other tests, including: °· Blood tests to check: °¨ Whether you have hepatitis B or C. °¨ Kidney function. °¨ Liver function. °· Imaging tests such as: °¨ MRI or CT scan to look for changes seen in advanced cirrhosis. °¨ Ultrasound to see if normal liver tissue is being replaced by scar tissue. °· A procedure using a long needle to take a sample of liver tissue (biopsy) for examination under a microscope. Liver biopsy can confirm the diagnosis of cirrhosis. °How is this treated? °Treatment depends on how damaged your liver is and what caused the damage. Treatment may include treating cirrhosis symptoms or treating the underlying causes of the condition to try to slow the progression of the damage. Treatment may include: °· Making lifestyle changes, such as: °¨ Eating a healthy diet. °¨ Restricting salt intake. °¨ Maintaining a healthy weight. °¨ Not abusing drugs or alcohol. °· Taking medicines to: °¨ Treat liver infections or other infections. °¨ Control itching. °¨ Reduce fluid buildup. °¨ Reduce certain blood toxins. °¨ Reduce risk of bleeding from enlarged blood vessels in the stomach or esophagus (varices). °· If varices are causing bleeding problems, you may need treatment with a procedure that ties up the vessels causing them to fall off (band ligation). °· If cirrhosis is causing your liver to fail, your health care provider may recommend a liver transplant. °· Other treatments may be recommended depending on any complications of cirrhosis, such as   liver-related kidney failure (hepatorenal syndrome). Follow these instructions at home:  Take medicines only as directed by your health care provider. Do not use drugs that are toxic to your liver. Ask your health care provider before  taking any new medicines, including over-the-counter medicines.  Rest as needed.  Eat a well-balanced diet. Ask your health care provider or dietitian for more information.  You may have to follow a low-salt diet or restrict your water intake as directed.  Do not drink alcohol. This is especially important if you are taking acetaminophen.  Keep all follow-up visits as directed by your health care provider. This is important. Contact a health care provider if:  You have fatigue or weakness that is getting worse.  You develop swelling of the hands, feet, legs, or face.  You have a fever.  You develop loss of appetite.  You have nausea or vomiting.  You develop jaundice.  You develop easy bruising or bleeding. Get help right away if:  You vomit bright red blood or a material that looks like coffee grounds.  You have blood in your stools.  Your stools appear black and tarry.  You become confused.  You have chest pain or trouble breathing. This information is not intended to replace advice given to you by your health care provider. Make sure you discuss any questions you have with your health care provider. Document Released: 09/04/2005 Document Revised: 01/13/2016 Document Reviewed: 05/13/2014 [employer] Interactive Patient Education  2017 [employer]   Bone and Joint Infections, Adult Introduction Bone infections (osteomyelitis) and joint infections (septic arthritis) occur when bacteria or other germs get inside a bone or a joint. This can happen if you have an infection in another part of your body that spreads through your blood. Germs from your skin or from outside of your body can also cause this type of infection if you have a wound or a broken bone (fracture) that breaks the skin. Anyone can get a bone infection or joint infection. You may be more likely to get this type of infection if you have a condition, such as diabetes, that lowers your ability to fight  infection or increases your chances of getting an infection. Bone and joint infections can cause damage, and they can spread to other areas of your body. They need to be treated quickly. What are the causes? Most bone and joint infections are caused by bacteria. They can also be caused by other germs, such as viruses and funguses. What increases the risk? This condition is more likely to develop in:  People who recently had surgery, especially bone or joint surgery.  People who have a long-term (chronic) disease, such as:  HIV (human immunodeficiency virus).  Diabetes.  Rheumatoid arthritis.  Sickle cell anemia.  Elderly people.  People who take medicines that block or weaken the bodys defense system (immune system).  People who have a condition that reduces their blood flow.  People who are on kidney dialysis.  People who have an artificial joint.  People who have had a joint or bone repaired with plates or screws (surgical hardware).  People who use or abuse IV drugs.  People who have had trauma, such as stepping on a nail. What are the signs or symptoms? Symptoms vary depending on the type and location of your infection. Common symptoms of bone and joint infections include:  Fever and chills.  Redness and warmth.  Swelling.  Pain and stiffness.  Drainage of fluid or pus near the  infection.  Weight loss and fatigue.  Decreased ability to use a hand or foot. How is this diagnosed? This condition may be diagnosed based on symptoms, medical history, a physical exam, and diagnostic tests. Tests can help to identify the cause of the infection. You may have various tests, such as:  A sample of tissue, fluid, or blood taken to be examined under a microscope.  A procedure to remove fluid from the infected joint with a needle (joint aspiration) for testing in a lab.  Pus or discharge swabbed from a wound for testing to identify germs and to determine what type of  medicine will kill them (culture and sensitivity).  Blood tests to look for evidence of infection and inflammation (biomarkers).  Imaging studies to determine how severe the bone or joint infection is. These may include:  X-rays.  CT scan.  MRI.  Bone scan. How is this treated? Treatment depends on the cause and type of infection. Antibiotic medicines are usually the first treatment for a bone or joint infection. Treatment with antibiotics may include:  Getting IV antibiotics. This may be done in a hospital at first. You may have to continue IV antibiotics at home for several weeks. You may also have to take antibiotics by mouth for several weeks after that.  Taking more than one kind of antibiotic. Treatment may start with a type of antibiotic that works against many different bacteria (broad spectrumantibiotics). IV antibiotics may be changed if tests show that another type may work better. Other treatments may include:  Draining fluid from the joint by placing a needle into it (aspiration).  Surgery to remove:  Dead or dying tissue from a bone or joint.  An infected artificial joint.  Infected plates or screws that were used to repair a broken bone. Follow these instructions at home:  Take medicines only as directed by your health care provider.  Take your antibiotic medicine as directed by your health care provider. Finish the antibiotic even if you start to feel better.  Follow instructions from your health care provider about how to take IV antibiotics at home.  Ask your health care provider if you have any restrictions on your activities.  Keep all follow-up visits as directed by your health care provider. This is important. Contact a health care provider if:  You have a fever or chills.  You have redness, warmth, pain, or swelling that returns after treatment. Get help right away if:  You have rapid breathing or you have trouble breathing.  You have chest  pain.  You cannot drink fluids or make urine.  The affected arm or leg swells, changes color, or turns blue. This information is not intended to replace advice given to you by your health care provider. Make sure you discuss any questions you have with your health care provider. Document Released: 09/04/2005 Document Revised: 02/10/2016 Document Reviewed: 09/02/2014  2017 [employer]

## 2016-11-09 ENCOUNTER — Encounter: Payer: Self-pay | Admitting: Internal Medicine

## 2016-11-09 ENCOUNTER — Non-Acute Institutional Stay (SKILLED_NURSING_FACILITY): Payer: BC Managed Care – PPO | Admitting: Internal Medicine

## 2016-11-09 DIAGNOSIS — E118 Type 2 diabetes mellitus with unspecified complications: Secondary | ICD-10-CM | POA: Diagnosis not present

## 2016-11-09 DIAGNOSIS — K7290 Hepatic failure, unspecified without coma: Secondary | ICD-10-CM | POA: Diagnosis not present

## 2016-11-09 DIAGNOSIS — Z794 Long term (current) use of insulin: Secondary | ICD-10-CM | POA: Diagnosis not present

## 2016-11-09 DIAGNOSIS — R188 Other ascites: Principal | ICD-10-CM

## 2016-11-09 DIAGNOSIS — K7460 Unspecified cirrhosis of liver: Secondary | ICD-10-CM

## 2016-11-09 DIAGNOSIS — D61818 Other pancytopenia: Secondary | ICD-10-CM

## 2016-11-09 DIAGNOSIS — K7210 Chronic hepatic failure without coma: Secondary | ICD-10-CM

## 2016-11-09 DIAGNOSIS — M8600 Acute hematogenous osteomyelitis, unspecified site: Secondary | ICD-10-CM | POA: Diagnosis not present

## 2016-11-09 NOTE — Progress Notes (Signed)
[facility]   [facility] Room Number: 109/A Place of Service:  [phone]) Provider:  Augusto GambleArlo Shardai Star  SOUTH,[name], MD  Patient Care Team: Adrian PrinceStephen South, MD as PCP - General (Endocrinology)  Extended Emergency Contact Information Primary Emergency Contact: Tula NakayamaBach,Richard  [place] Mobile Phone: [phone] Relation: None Secondary Emergency Contact: Suzan NailerFerris,Maureen  [place] Home Phone: [phone] Relation: Sister  Code Status:  Full Code Goals of care: Advanced Directive information Advanced Directives 11/09/2016  Does Patient Have a Medical Advance Directive? Yes  Type of Advance Directive (No Data)  Does patient want to make changes to medical advance directive? No - Patient declined  Would patient like information on creating a medical advance directive? -  Pre-existing out of facility DNR order (yellow form or pink MOST form) -     Chief Complaint  Patient presents with  . Acute Visit    HPI:  Pt is a 66 y.o. 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  He received IV Zosyn and by mouth doxycycline changed to doxycycline and IV Unasyn which he completed prior to discharge to skilled nursing.  He does have a history of chronic liver disease with cirrhosis underwent paracentesis during his hospitalization.  Also was given IV albumin every artery blood transfusion.  He did undergo a first ray amputation on 10/25/2016 VAC was applied.  Did have an EGD that showed nonbleeding esophageal ulcers grade 1 esophageal varices and portal hypertension.  With multiple nonobstructing oozing with diurnal ulcer.  He is here for rehabilitation and apparently there is consideration for discharge home eventually with hospice follow this does not appear to be a firm decision however.  He currently he is resting in bed comfortably  is not complaining of any acute discomfort or pain but does appear to be weak.  Vital signs appear to be stable.     Past Medical History:  Diagnosis Date  . Arthritis   . Asthma   . Bilateral cataracts   . Blurred vision    improved now  . Cholelithiasis   . Diabetes mellitus    type II  . Eczema    inner wrist  . Hypertension   . Muscle cramps   . Necrotizing fasciitis (HCC)   . Neuropathy (HCC)   . Rash    wrist and hands since late june  . Retinal degeneration    Past Surgical History:  Procedure Laterality Date  . ABOVE KNEE LEG AMPUTATION     right  . APPLICATION OF WOUND VAC Left 11/06/2016   Procedure: APPLICATION OF WOUND VAC;  Surgeon: Eldred MangesMark C Yates, MD;  Location: [facility] OR;  Service: Orthopedics;  Laterality: Left;  . COLONOSCOPY WITH PROPOFOL N/A 05/18/2016   Procedure: COLONOSCOPY WITH PROPOFOL;  Surgeon: Rachael Feeaniel P Jacobs, MD;  Location: [facility] ENDOSCOPY;  Service: Endoscopy;  Laterality: N/A;  . ESOPHAGOGASTRODUODENOSCOPY (EGD) WITH PROPOFOL N/A 05/18/2016   Procedure: ESOPHAGOGASTRODUODENOSCOPY (EGD) WITH PROPOFOL;  Surgeon: Rachael Feeaniel P Jacobs, MD;  Location: [facility] ENDOSCOPY;  Service: Endoscopy;  Laterality: N/A;  . ESOPHAGOGASTRODUODENOSCOPY (EGD) WITH PROPOFOL N/A 11/01/2016   Procedure: ESOPHAGOGASTRODUODENOSCOPY (EGD) WITH PROPOFOL;  Surgeon: Napoleon FormKavitha Nandigam V, MD;  Location: [facility] ENDOSCOPY;  Service: Endoscopy;  Laterality: N/A;  . EYE SURGERY  09/2011   bilateral for retinopathy - laser eye surgery  . I&D EXTREMITY Left 10/25/2016   Procedure: LEFT FOOT DEBRIDEMENT RAY AMPUTATION PLACEMENT OF  WOUND VAC;  Surgeon: Eldred Manges, MD;  Location: [facility] OR;  Service: Orthopedics;  Laterality: Left;  . I&D EXTREMITY Left 11/06/2016   Procedure: IRRIGATION AND DEBRIDEMENT EXTREMITY/LEFT FOOT;  Surgeon: Eldred Manges, MD;  Location: [facility] OR;  Service: Orthopedics;  Laterality: Left;  . left metatarsal  2003   5th  . TOE AMPUTATION     Left big toe    No Known Allergies  Allergies  as of 11/09/2016   No Known Allergies     Medication List       Accurate as of 11/09/16  2:13 PM. Always use your most recent med list.          atorvastatin 40 MG tablet Commonly known as:  LIPITOR Take 40 mg by mouth daily.   feeding supplement (GLUCERNA SHAKE) Liqd Take 237 mLs by mouth 3 (three) times daily between meals.   feeding supplement (PRO-STAT SUGAR FREE 64) Liqd Take 30 mLs by mouth 3 (three) times daily.   furosemide 20 MG tablet Commonly known as:  LASIX Take 1 tablet (20 mg total) by mouth daily.   insulin aspart 100 UNIT/ML injection Commonly known as:  novoLOG Inject 0-9 Units into the skin 4 (four) times daily -  with meals and at bedtime.   loperamide 2 MG capsule Commonly known as:  IMODIUM Take 1 capsule (2 mg total) by mouth as needed for diarrhea or loose stools.   methocarbamol 500 MG tablet Commonly known as:  ROBAXIN Take 0.5 tablets (250 mg total) by mouth every 8 (eight) hours as needed for muscle spasms.   metoCLOPramide 5 MG tablet Commonly known as:  REGLAN Take 1 tablet (5 mg total) by mouth every 6 (six) hours as needed for nausea.   multivitamin with minerals Tabs tablet Take 1 tablet by mouth daily.   nadolol 20 MG tablet Commonly known as:  CORGARD Take 1 tablet (20 mg total) by mouth daily.   nystatin powder Commonly known as:  MYCOSTATIN/NYSTOP Apply topically 2 (two) times daily.   oxyCODONE 5 MG immediate release tablet Commonly known as:  Oxy IR/ROXICODONE Take 1 tablet (5 mg total) by mouth every 4 (four) hours as needed for severe pain.   pantoprazole 40 MG tablet Commonly known as:  PROTONIX Take 1 tablet (40 mg total) by mouth 2 (two) times daily.   saccharomyces boulardii 250 MG capsule Commonly known as:  FLORASTOR Take 1 capsule (250 mg total) by mouth 2 (two) times daily.   spironolactone 25 MG tablet Commonly known as:  ALDACTONE Take 2 tablets (50 mg total) by mouth daily.   sucralfate 1 GM/10ML  suspension Commonly known as:  CARAFATE Take 10 mLs (1 g total) by mouth 4 (four) times daily -  with meals and at bedtime.       Review of Systems   In general is not complaining of fever or chills.  Skin does not complain of rashes or itching does have some bruising more so his left hip area.  Eyes does not complain of visual changes.  Nose is not complaining of nasal drainage.  Throat is not complaining of sore throat.  Respiratory is not complaining of shortness of breath  Cardiac does not when of chest pain does have lower extremity edema.  GI is not complaining of nausea or vomiting does have a history of cirrhosis with distention.  GU is not complaining of dysuria.  Muscle skeletal does have weakness but is not currently complaining of joint  pain has been treated for osteomyelitis as noted above.  Left foot does have a wound VAC applied.  Neurologic is not complaining of dizziness headache or syncope.  Psych is not complaining overtly of depression or anxiety    Immunization History  Administered Date(s) Administered  . Hep A / Hep B 08/31/2016, 09/08/2016   Pertinent  Health Maintenance Due  Topic Date Due  . FOOT EXAM  [birth year]08/1961  . OPHTHALMOLOGY EXAM  05/08/1960  . URINE MICROALBUMIN  05/08/1960  . INFLUENZA VACCINE  08/18/2017 (Originally 04/18/2016)  . PNA vac Low Risk Adult (1 of 2 - PCV13) 08/18/2017 (Originally 05/09/2015)  . HEMOGLOBIN A1C  04/23/2017  . COLONOSCOPY  05/18/2026   No flowsheet data found. Functional Status Survey:    Vitals:   11/09/16 1210  BP: 119/62  Pulse: 69  Resp: 16  Temp: 98.1 F (36.7 C)  TempSrc: Oral  SpO2: 98%  Weight: 263 lb 6.4 oz (119.5 kg)  Height: 6\' 1"  (1.854 m)   Body mass index is 34.75 kg/m. Physical Exam   Constitutional: He is oriented to person, place, and time. He appears well-developed.  Frail appearing in NAD, currently lying in bed   HENT:  Mouth/Throat: Oropharynx is clear and moist.    Eyes: Pupils are equal, round, and reactive to light. No scleral icterus. He has prescription lenses   Neck: Neck supple. Carotid bruit is not present. No thyromegaly present.  Cardiovascular: Normal rate, regular rhythm and intact distal pulses.  Exam reveals no gallop and no friction rub.   Murmur (1/6 SEM) heard. Trace  LLE swelling. +1 pitting edema at right AKA stump.  No left calf TTP. Chronic venous stasis changes noted on LLE  Pulmonary/Chest: Effort normal and breath sounds normal. He has no wheezes. He has no rales. He exhibits no tenderness.  Abdominal: Soft. He exhibits distension, fluid wave and ascites. He exhibits no abdominal bruit, no pulsatile midline mass and no mass. Bowel sounds are positive. There is hepatomegaly. There is no tenderness. There is no rebound and no guarding. No hernia.  obese  Musculoskeletal: He exhibits deformity (right AKA; left 1st toe amputation) wound VAC is applied to left foot.  Lymphadenopathy:    He has no cervical adenopathy.  Neurological: He is alert and oriented to person, place, and time.  Skin: Skin is warm and dry. Ecchymosis noted. No rash noted.     Psychiatric: He has a normal mood and affect. His behavior is normal. Judgment and thought content normal.    Labs reviewed:  Recent Labs  10/29/16 0226 10/30/16 0315 10/31/16 0341  11/06/16 1718 11/07/16 0550 11/08/16 0446  NA 134* 133* 135  < > 135 136 133*  K 4.3 4.6 4.5  < > 3.7 3.6 3.4*  CL 107 107 107  < > 107 109 107  CO2 21* 19* 18*  < > 17* 21* 19*  GLUCOSE 123* 139* 118*  < > 104* 154* 135*  BUN 22* 26* 35*  < > 36* 35* 32*  CREATININE 1.81* 1.91* 2.50*  < > 1.67* 1.69* 1.63*  CALCIUM 7.9* 8.1* 8.4*  < > 8.5* 8.4* 8.2*  MG 1.6* 2.0 2.0  --   --   --   --   PHOS  --   --   --   < > 3.2 3.5 3.3  < > = values in this interval not displayed.  Recent Labs  10/23/16 2340 10/26/16 0252  11/06/16 1718 11/07/16 0550 11/08/16 1610  AST 42* 61*  --   --  39  --    ALT 24 26  --   --  18  --   ALKPHOS 62 52  --   --  45  --   BILITOT 2.5* 1.4*  --   --  2.7*  --   PROT 6.6 6.0*  --   --  5.6*  --   ALBUMIN 1.9* 1.5*  < > 2.1* 1.9* 2.0*  < > = values in this interval not displayed.  Recent Labs  10/23/16 1138  10/30/16 0708  11/06/16 1718 11/07/16 0550 11/08/16 0446  WBC 9.5  < >  --   < > 5.5 5.0 3.7*  NEUTROABS 7.5  --  6.2  --   --  3.1  --   HGB 10.5*  < >  --   < > 9.0* 8.7* 8.1*  HCT 30.2*  < >  --   < > 26.9* 25.8* 24.2*  MCV 99.3  < >  --   < > 96.1 96.6 97.2  PLT 115*  < >  --   < > 51* 46* 41*  < > = values in this interval not displayed. No results found for: TSH Lab Results  Component Value Date   HGBA1C 5.8 (H) 10/24/2016   Lab Results  Component Value Date   CHOL 51 10/24/2016   HDL 13 (L) 10/24/2016   LDLCALC 28 10/24/2016   TRIG 48 10/24/2016   CHOLHDL 3.9 10/24/2016    Significant Diagnostic Results in last 30 days:  Ct Abdomen Pelvis Wo Contrast  Result Date: 10/23/2016 CLINICAL DATA:  Abdominal pain with diarrhea for 2-3 days EXAM: CT ABDOMEN AND PELVIS WITHOUT CONTRAST TECHNIQUE: Multidetector CT imaging of the abdomen and pelvis was performed following the standard protocol without IV contrast. COMPARISON:  None. FINDINGS: Lower chest: No acute abnormality. Hepatobiliary: Liver is diminutive in size with a nodular contour consistent with cirrhosis. Large volume abdominal ascites. Cholelithiasis without biliary dilatation. Pancreas: Unremarkable. No pancreatic ductal dilatation or surrounding inflammatory changes. Spleen: Normal in size without focal abnormality. Adrenals/Urinary Tract: Normal adrenal glands. No urolithiasis with or obstructive uropathy. 1.7 x 1.9 cm hypodense, fluid attenuating right interpolar renal mass most consistent with a cyst. 5.4 x 4.8 cm hypodense, fluid attenuating left renal mass most consistent with a cyst. 13 x 9 mm hyperdense exophytic left posterior interpolar renal mass likely  reflecting a hemorrhagic or proteinaceous cyst but is technically indeterminate. Normal bladder. Stomach/Bowel: No bowel dilatation. Mild relative bowel wall thickening throughout the colon which may reflect reactive edema secondary to ascites versus mild colitis. No pneumatosis, pneumoperitoneum or portal venous gas. Normal appendix. Vascular/Lymphatic: Normal caliber abdominal aorta with atherosclerosis. No lymphadenopathy. Reproductive: Prostate is unremarkable. Other: No other fluid collection or hematoma. Musculoskeletal: No lytic or sclerotic osseous lesion. Diffuse thoracolumbar spine spondylosis. IMPRESSION: 1. Cirrhosis with large volume ascites. 2. Mild relative bowel wall thickening throughout the colon which may reflect reactive edema secondary to ascites versus mild colitis. 3. Cholelithiasis. 4. Bilateral renal cysts. Electronically Signed   By: Elige Ko   On: 10/23/2016 21:14   US Renal  Result Date: 10/31/2016 CLINICAL DATA:  Acute kidney injury. EXAM: RENAL / URINARY TRACT ULTRASOUND COMPLETE COMPARISON:  Ultrasound of September 01, 2016. FINDINGS: Right Kidney: Length: 13.2 cm. Echogenicity within normal limits. No mass or hydronephrosis visualized. Left Kidney: Length: 13.5 cm. 5.2 cm simple cyst is seen in lower pole. 2.3 cm simple cyst is seen  in upper pole. Echogenicity within normal limits. No mass or hydronephrosis visualized. Bladder: Appears normal for degree of bladder distention. Ascites is noted. IMPRESSION: Ascites.  Simple left renal cysts.  No other abnormality seen. Electronically Signed   By: Lupita Raider, M.D.   On: 10/31/2016 09:37   Mr Foot Left Wo Contrast  Result Date: 10/24/2016 CLINICAL DATA:  Nonhealing ulcer at the ball of the foot below were the great toe use to be. Query osteomyelitis. EXAM: MRI OF THE LEFT FOOT WITHOUT CONTRAST TECHNIQUE: Multiplanar, multisequence MR imaging of the left forefoot was performed. No intravenous contrast was administered.  COMPARISON:  CT left lower extremity 04/06/2010 FINDINGS: Bones/Joint/Cartilage Previous amputation of the great toe at the base of the proximal phalanx. Somewhat irregular appearance of the first metatarsal head with increased marrow signal intensity in the distal first metatarsal head as well as in the remaining proximal phalangeal stump. Focal subperiosteal signal intensity along the metatarsal hand. Changes suggest focal osteomyelitis. Scattered marrow signal intensity changes in the intertarsal bones probably represent degenerative change. Hammertoe deformities. Muscles and Tendons Visualized flexor and extensor tendons appear intact with normal signal intensity. Edema in the plantar musculature of the forefoot suggesting inflammatory change. Soft tissues Subcutaneous soft tissue edema around the first metatarsal head and stump with focal ulceration inferior and lateral to the metatarsal head suggesting cellulitis. No loculated fluid collection to suggest abscess. IMPRESSION: Previous amputation of the right first toe. Soft tissue edema consistent with cellulitis and ulceration at the stomach and over the first metatarsal head. Edema in the plantar musculature. Focal bone erosion and marrow signal changes at the distal first metatarsal head and involving the proximal phalangeal stump likely indicates focal osteomyelitis. Electronically Signed   By: Burman Nieves M.D.   On: 10/24/2016 06:28   US Paracentesis  Result Date: 10/24/2016 INDICATION: New onset abdominal pain with new findings of ascites. Request is made for diagnostic and therapeutic paracentesis. EXAM: ULTRASOUND GUIDED DIAGNOSTIC AND THERAPEUTIC PARACENTESIS MEDICATIONS: 1% lidocaine. COMPLICATIONS: None immediate. PROCEDURE: Informed written consent was obtained from the patient after a discussion of the risks, benefits and alternatives to treatment. A timeout was performed prior to the initiation of the procedure. Initial ultrasound scanning  demonstrates a small amount of ascites within the right lower abdominal quadrant. The right lower abdomen was prepped and draped in the usual sterile fashion. 1% lidocaine was used for local anesthesia. Following this, a 19 gauge, 7-cm, Yueh catheter was introduced. An ultrasound image was saved for documentation purposes. The paracentesis was performed. The catheter was removed and a dressing was applied. The patient tolerated the procedure well without immediate post procedural complication. FINDINGS: A total of approximately 3.3 L of yellow fluid was removed. Samples were sent to the laboratory as requested by the clinical team. IMPRESSION: Successful ultrasound-guided paracentesis yielding 3.3 liters of peritoneal fluid. Read by: Barnetta Chapel, PA-C Electronically Signed   By: Simonne Come M.D.   On: 10/24/2016 11:23   Dg Abd Portable 1v  Result Date: 10/30/2016 CLINICAL DATA:  Nausea and vomiting. Cholelithiasis and hypertension. EXAM: PORTABLE ABDOMEN - 1 VIEW COMPARISON:  CT abdomen from 10/23/2016 FINDINGS: Moderate gaseous distention of the stomach possibly representing gastroparesis. Scattered air containing nonobstructed, nondistended large bowel. No significant small bowel dilatation. Calcification in the left upper quadrant is noted not clearly identified on the prior CT. It does not appear to represent nephrolithiasis. No definite calcified lymph node is seen on prior CT. Splenic vascular calcification might account for  this. Mild degenerate change of the dorsal spine. IMPRESSION: Moderate gaseous distention of the stomach which may represent gastroparesis. No obstructive bowel gas pattern. No definite free air. Electronically Signed   By: Tollie Eth M.D.   On: 10/30/2016 20:24   Dg Foot 2 Views Left  Result Date: 10/24/2016 CLINICAL DATA:  Osteomyelitis EXAM: LEFT FOOT - 2 VIEW COMPARISON:  MRI 10/24/2016 FINDINGS: Changes of prior amputation of the left great toe. Lucency/irregularity within  the distal aspect of the left first metatarsal may reflect area of bone destruction and osteomyelitis. No additional acute bony abnormality. Prior internal fixation of an old healed fifth metatarsal fracture. IMPRESSION: Lucency in the distal left first metatarsal, possibly area of osteomyelitis. Electronically Signed   By: Charlett Nose M.D.   On: 10/24/2016 17:44   US Abdomen Limited Ruq  Result Date: 11/02/2016 CLINICAL DATA:  Hepatic cirrhosis. EXAM: US ABDOMEN LIMITED - RIGHT UPPER QUADRANT COMPARISON:  Ultrasound of October 31, 2016. FINDINGS: Gallbladder: 1.5 cm gallstone is noted. No gallbladder wall thickening or pericholecystic fluid is noted. No sonographic Murphy's sign is noted. Common bile duct: Not visualized. Liver: Liver is small with nodular margins consistent with hepatic cirrhosis. Ascites is noted. IMPRESSION: Ascites. Solitary gallstone without gallbladder wall thickening or pericholecystic fluid. Findings consistent with hepatic cirrhosis. Electronically Signed   By: Lupita Raider, M.D.   On: 11/02/2016 21:47    Assessment/Plan  Status post left first ray amputation-with history of osteomyelitis he continues with a wound VAC-he will be followed closely by wound care-has completed antibiotic treatment in the hospital.  #2 history of end-stage liver disease-with cirrhosis of liver-, complicated with acute blood loss anemia.  He is status post paracentesis.  He is currently on 50 mg of Aldactone as well as 20 mg of Lasix.  His weights and clinical status Will need to be monitored closely-there is some apparent consideration of hospice or palliative care although this is not being pursued currently.  Hemoglobin of 8.1 on lab done yesterday appears to be relatively baseline.  #3 history of diarrhea with nausea apparently this has resolved is a probiotic as well as Reglan which was changed to when necessary.  #4-history of thrombocytopenia his aspirin has been stopped this  will need monitoring periodically platelets w 41,000 lab done yesterday  #5-history of acute kidney injury and is now stable creatinine 1.63 on lab done yesterday-this was thought due to dehydration and diarrhea previously his Hansel Feinstein has been decreased  #6 history of type 2 diabetes he is on a sliding scale this appears stable recent blood sugars in the high 90s to mid 100s.  #7 history of depression this appears relatively stable on Paxil this will have to be watched closely.  #8 history of dysphagia again he is on Reglan as well as Carafate.  #9 history of hyperlipidemia he continues on Lipitor.  Of note he does have a CBC with differential and CMP pending.  Also will need weights monitored closely daily notify provider of gain greater than 3 pounds.  [id number] note greater than 40 minutes spent assessing patient-discussing his status with nursing staff-reviewing his chart and labs-and coordinating and formulating a plan of care for numerous diagnoses-of note greater than 50% of time spent coordinating plan of care

## 2016-11-10 LAB — CBC AND DIFFERENTIAL
HEMATOCRIT: 26 % — AB (ref 41–53)
Hemoglobin: 8.6 g/dL — AB (ref 13.5–17.5)
Neutrophils Absolute: 3 /uL
Platelets: 43 10*3/uL — AB (ref 150–399)
WBC: 3.9 10*3/mL

## 2016-11-10 LAB — HEPATIC FUNCTION PANEL
ALK PHOS: 64 U/L (ref 25–125)
ALT: 8 U/L — AB (ref 10–40)
AST: 36 U/L (ref 14–40)
Bilirubin, Total: 2.7 mg/dL

## 2016-11-10 LAB — BASIC METABOLIC PANEL
BUN: 32 mg/dL — AB (ref 4–21)
CREATININE: 1.3 mg/dL (ref <=1.3)
Glucose: 156 mg/dL
Potassium: 4.1 mmol/L (ref 3.4–5.3)
SODIUM: 137 mmol/L (ref 137–147)

## 2016-11-13 ENCOUNTER — Encounter: Payer: Self-pay | Admitting: Gastroenterology

## 2016-11-14 ENCOUNTER — Encounter: Payer: Self-pay | Admitting: Internal Medicine

## 2016-11-14 ENCOUNTER — Non-Acute Institutional Stay (SKILLED_NURSING_FACILITY): Payer: BC Managed Care – PPO | Admitting: Internal Medicine

## 2016-11-14 ENCOUNTER — Ambulatory Visit: Payer: BC Managed Care – PPO | Admitting: Gastroenterology

## 2016-11-14 DIAGNOSIS — Z794 Long term (current) use of insulin: Secondary | ICD-10-CM

## 2016-11-14 DIAGNOSIS — IMO0002 Reserved for concepts with insufficient information to code with codable children: Secondary | ICD-10-CM

## 2016-11-14 DIAGNOSIS — I8510 Secondary esophageal varices without bleeding: Secondary | ICD-10-CM | POA: Diagnosis not present

## 2016-11-14 DIAGNOSIS — D550 Anemia due to glucose-6-phosphate dehydrogenase [G6PD] deficiency: Secondary | ICD-10-CM

## 2016-11-14 DIAGNOSIS — K7290 Hepatic failure, unspecified without coma: Secondary | ICD-10-CM

## 2016-11-14 DIAGNOSIS — R188 Other ascites: Secondary | ICD-10-CM

## 2016-11-14 DIAGNOSIS — Z89419 Acquired absence of unspecified great toe: Secondary | ICD-10-CM

## 2016-11-14 DIAGNOSIS — R5381 Other malaise: Secondary | ICD-10-CM | POA: Diagnosis not present

## 2016-11-14 DIAGNOSIS — D689 Coagulation defect, unspecified: Secondary | ICD-10-CM

## 2016-11-14 DIAGNOSIS — E118 Type 2 diabetes mellitus with unspecified complications: Secondary | ICD-10-CM

## 2016-11-14 DIAGNOSIS — Z7189 Other specified counseling: Secondary | ICD-10-CM

## 2016-11-14 DIAGNOSIS — K7210 Chronic hepatic failure without coma: Secondary | ICD-10-CM

## 2016-11-14 DIAGNOSIS — K7460 Unspecified cirrhosis of liver: Secondary | ICD-10-CM | POA: Diagnosis not present

## 2016-11-14 DIAGNOSIS — D696 Thrombocytopenia, unspecified: Secondary | ICD-10-CM

## 2016-11-14 DIAGNOSIS — E440 Moderate protein-calorie malnutrition: Secondary | ICD-10-CM

## 2016-11-14 DIAGNOSIS — N179 Acute kidney failure, unspecified: Secondary | ICD-10-CM

## 2016-11-14 NOTE — Progress Notes (Signed)
Patient ID: Jeffery [name], male   DOB: 09-02-1951, 66 y.o.   MRN: [id number]    HISTORY AND PHYSICAL   DATE: 11/14/2016  Location:    [facility] Room Number: 10-9 A Place of Service: SNF (31)   Extended Emergency Contact Information Primary Emergency Contact: Bach,Richard  [place] Mobile Phone: [phone] Relation: None Secondary Emergency Contact: Suzan Nail[place] Home Phone: [phone] Relation: Sister  Advanced Directive information Does Patient Have a Medical Advance Directive?: Yes, Type of Advance Directive: Out of facility DNR (pink MOST or yellow form), Pre-existing out of facility DNR order (yellow form or pink MOST form): Yellow form placed in chart (order not valid for inpatient use), Does patient want to make changes to medical advance directive?: No - Patient declined  Chief Complaint  Patient presents with  . 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 He rec'd IV zosyn and po doxy --> doxy and IV unasyn. He completed abx prior to d/c. He underwent paracentesis by IR on 2/6th --> 3L fluid removal. Cytology of fluid neg for SBP. He was given IV albumin and also rec'd total of 4 units PRBCs and 1 unit platelets. Imaging of foot revealed osteomyelitis. He underwent left 1st ray amputation on 10/25/16 and wound vac applied. EGD on 11/01/16 revealed nonbleeding esophageal ulcers; grade 1 esophageal varices; portal HTNsive gastropathy; multiple nonobstructing oozing duodenal ulcers with no stigmata of bleeding. C diff toxin neg; GI pathogen panel neg; Hgb 7.1-->6-->8.1; FOBT (+); AST as high as 61-->39; HCO3 level 19; albumin 2.1-->2; plts as low  as 46K-->54K-->41K; Cr 2.49-->1.63; K 5.4-->3.4; INR 1.96-->3.41-->2.27 without anticoagulant use and after Vitamin K x 3; prealbumin <5; A1c 5.8% at d/c. Code status at d/c DNR. He presents to SNF for short term rehab.  Today he reports feeling fatigue. Pain is controlled on roxicodone. He reports poor balance due to recent left 1st toe amputation and previous right AKA. He is c/a peripheral edema. No spontaneous bleeding. No falls. He is tolerating PT. He is not sure if he was told whether he was a candidate for transplant. 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 He denies Etoh/illicit drug use  Hyperlipidemia - stable on lipitor  Asthma - no recent exacerbations  Liver cirrhosis with ascites - end stage with esophageal varices and portal HTNsive gastropathy. followed by GI. Takes lasix, nadolol and aldactone. He has pancytopenia  Moderate malnutrition - gets nutritional supplements per facility protocol. Takes vitamins/minerals. Albumin 2 with prealbumin <5.   DM - stable. A1c 5.8%. Takes novolog SSI.  GERD - stable on protonix and carafate  Past Medical History:  Diagnosis Date  . Arthritis   . Asthma   . Bilateral cataracts   . Blurred vision    improved now  . Cholelithiasis   . Diabetes mellitus    type II  . Eczema    inner wrist  . Hypertension   . Muscle cramps   . Necrotizing fasciitis (HCC)   . Neuropathy (HCC)   . Rash    wrist and  hands since late june  . Retinal degeneration     Past Surgical History:  Procedure Laterality Date  . ABOVE KNEE LEG AMPUTATION     right  . APPLICATION OF WOUND VAC Left 11/06/2016   Procedure: APPLICATION OF WOUND VAC;  Surgeon: Eldred Manges, MD;  Location: [facility] OR;  Service: Orthopedics;  Laterality: Left;  . COLONOSCOPY WITH PROPOFOL N/A 05/18/2016   Procedure: COLONOSCOPY WITH PROPOFOL;   Surgeon: Rachael Fee, MD;  Location: [facility];  Service: Endoscopy;  Laterality: N/A;  . ESOPHAGOGASTRODUODENOSCOPY (EGD) WITH PROPOFOL N/A 05/18/2016   Procedure: ESOPHAGOGASTRODUODENOSCOPY (EGD) WITH PROPOFOL;  Surgeon: Rachael Fee, MD;  Location: [facility];  Service: Endoscopy;  Laterality: N/A;  . ESOPHAGOGASTRODUODENOSCOPY (EGD) WITH PROPOFOL N/A 11/01/2016   Procedure: ESOPHAGOGASTRODUODENOSCOPY (EGD) WITH PROPOFOL;  Surgeon: Napoleon Form, MD;  Location: [facility];  Service: Endoscopy;  Laterality: N/A;  . EYE SURGERY  09/2011   bilateral for retinopathy - laser eye surgery  . I&D EXTREMITY Left 10/25/2016   Procedure: LEFT FOOT DEBRIDEMENT RAY AMPUTATION PLACEMENT OF WOUND VAC;  Surgeon: Eldred Manges, MD;  Location: [facility] OR;  Service: Orthopedics;  Laterality: Left;  . I&D EXTREMITY Left 11/06/2016   Procedure: IRRIGATION AND DEBRIDEMENT EXTREMITY/LEFT FOOT;  Surgeon: Eldred Manges, MD;  Location: [facility] OR;  Service: Orthopedics;  Laterality: Left;  . left metatarsal  2003   5th  . TOE AMPUTATION     Left big toe    Patient Care Team: Adrian Prince, MD as PCP - General (Endocrinology)  Social History   Social History  . Marital status: Married    Spouse name: N/A  . Number of children: N/A  . Years of education: N/A   Occupational History  . Not on file.   Social History Main Topics  . Smoking status: Never Smoker  . Smokeless tobacco: Never Used  . Alcohol use No  . Drug use: No  . Sexual activity: Not on file     Comment: computer work. next of kin. Sister Marisue Humble next of kin   Other Topics Concern  . Not on file   Social History Narrative  . No narrative on file     reports that he has never smoked. He has never used smokeless tobacco. He reports that he does not drink alcohol or use drugs.  Family History  Problem Relation Age of Onset  . Cancer Mother     unknown type   Family Status  Relation Status  . Mother Deceased at age 47     Immunization History  Administered Date(s) Administered  . Hep A / Hep B 08/31/2016, 09/08/2016  . PPD Test 11/09/2016    No Known Allergies  Medications: Patient's Medications  New Prescriptions   No medications on file  Previous Medications   AMINO ACIDS-PROTEIN HYDROLYS (FEEDING SUPPLEMENT, PRO-STAT SUGAR FREE 64,) LIQD    Take 30 mLs by mouth 3 (three) times daily.   ATORVASTATIN (LIPITOR) 40 MG TABLET    Take 40 mg by mouth daily.   BRIMONIDINE (ALPHAGAN P) 0.1 % SOLN    Place 1 drop into both eyes daily.   FEEDING SUPPLEMENT, GLUCERNA SHAKE, (GLUCERNA SHAKE) LIQD    Take 237 mLs by mouth 3 (three) times daily between meals.   FUROSEMIDE (LASIX) 20 MG TABLET    Take 1 tablet (20 mg total) by mouth daily.   INSULIN ASPART (NOVOLOG) 100 UNIT/ML INJECTION    Inject 0-9 Units into the skin 4 (  four) times daily -  with meals and at bedtime.   LOPERAMIDE (IMODIUM) 2 MG CAPSULE    Take 1 capsule (2 mg total) by mouth as needed for diarrhea or loose stools.   METHOCARBAMOL (ROBAXIN) 500 MG TABLET    Take 0.5 tablets (250 mg total) by mouth every 8 (eight) hours as needed for muscle spasms.   METOCLOPRAMIDE (REGLAN) 5 MG TABLET    Take 1 tablet (5 mg total) by mouth every 6 (six) hours as needed for nausea.   MULTIPLE VITAMIN (MULTIVITAMIN WITH MINERALS) TABS TABLET    Take 1 tablet by mouth daily.   NADOLOL (CORGARD) 20 MG TABLET    Take 1 tablet (20 mg total) by mouth daily.   NYSTATIN (MYCOSTATIN/NYSTOP) POWDER    Apply topically 2 (two) times daily.   OXYCODONE (OXY IR/ROXICODONE) 5 MG IMMEDIATE RELEASE TABLET    Take 1 tablet (5 mg total) by mouth every 4 (four) hours as needed for severe pain.   PANTOPRAZOLE (PROTONIX) 40 MG TABLET    Take 1 tablet (40 mg total) by mouth 2 (two) times daily.   SACCHAROMYCES BOULARDII (FLORASTOR) 250 MG CAPSULE    Take 1 capsule (250 mg total) by mouth 2 (two) times daily.   SPIRONOLACTONE (ALDACTONE) 25 MG TABLET    Take 2 tablets (50 mg total) by  mouth daily.   SUCRALFATE (CARAFATE) 1 GM/10ML SUSPENSION    Take 10 mLs (1 g total) by mouth 4 (four) times daily -  with meals and at bedtime.   VITAMIN C (ASCORBIC ACID) 500 MG TABLET    Take 500 mg by mouth 2 (two) times daily.   ZINC SULFATE (ZINC-220 PO)    Take 1 tablet by mouth daily.  Modified Medications   No medications on file  Discontinued Medications   No medications on file    Review of Systems  Unable to perform ROS: Other    Vitals:   11/14/16 1001  BP: 119/60  Pulse: 64  Resp: 17  Temp: 98.1 F (36.7 C)  TempSrc: Oral  SpO2: 98%  Weight: 258 lb 3.2 oz (117.1 kg)  Height: 6\' 1"  (1.854 m)   Body mass index is 34.07 kg/m.  Physical Exam  Constitutional: He is oriented to person, place, and time. He appears well-developed.  Frail appearing in NAD, sitting up in bed  HENT:  Mouth/Throat: Oropharynx is clear and moist.  MMM; no oral thrush  Eyes: Pupils are equal, round, and reactive to light. No scleral icterus.  Neck: Neck supple. Carotid bruit is not present. No thyromegaly present.  Cardiovascular: Normal rate, regular rhythm and intact distal pulses.  Exam reveals no gallop and no friction rub.   Murmur (1/6 SEM) heard. Trace  LLE swelling. +1 pitting edema at right AKA stump.  No left calf TTP. Chronic venous stasis changes noted on LLE  Pulmonary/Chest: Effort normal and breath sounds normal. He has no wheezes. He has no rales. He exhibits no tenderness.  Abdominal: Soft. He exhibits distension, fluid wave and ascites. He exhibits no abdominal bruit, no pulsatile midline mass and no mass. Bowel sounds are decreased. There is hepatomegaly. There is no tenderness. There is no rebound and no guarding. No hernia.  obese  Musculoskeletal: He exhibits deformity (right AKA; left 1st toe amputation).  Lymphadenopathy:    He has no cervical adenopathy.  Neurological: He is alert and oriented to person, place, and time.  Skin: Skin is warm and dry. Ecchymosis  noted. No  rash noted.  Left 1st ray amputation wound dsg intact with sutures present distally. Wound vac disconnected at time of exam. No purulent drainage.   Psychiatric: He has a normal mood and affect. His behavior is normal. Judgment and thought content normal.     Labs reviewed: Admission on 10/23/2016, Discharged on 11/08/2016  No results displayed because visit has over 200 results.  CBC Latest Ref Rng & Units 11/08/2016 11/07/2016 11/06/2016  WBC 4.0 - 10.5 K/uL 3.7(L) 5.0 5.5  Hemoglobin 13.0 - 17.0 g/dL 8.1(L) 8.7(L) 9.0(L)  Hematocrit 39.0 - 52.0 % 24.2(L) 25.8(L) 26.9(L)  Platelets 150 - 400 K/uL 41(L) 46(L) 51(L)   CMP Latest Ref Rng & Units 11/08/2016 11/07/2016 11/06/2016  Glucose 65 - 99 mg/dL 161(W135(H) 960(A154(H) 540(J104(H)  BUN 6 - 20 mg/dL 81(X32(H) 91(Y35(H) 78(G36(H)  Creatinine 0.61 - 1.24 mg/dL 9.56(O1.63(H) 1.30(Q1.69(H) 6.57(Q1.67(H)  Sodium 135 - 145 mmol/L 133(L) 136 135  Potassium 3.5 - 5.1 mmol/L 3.4(L) 3.6 3.7  Chloride 101 - 111 mmol/L 107 109 107  CO2 22 - 32 mmol/L 19(L) 21(L) 17(L)  Calcium 8.9 - 10.3 mg/dL 8.2(L) 8.4(L) 8.5(L)  Total Protein 6.5 - 8.1 g/dL - 5.6(L) -  Total Bilirubin 0.3 - 1.2 mg/dL - 2.7(H) -  Alkaline Phos 38 - 126 U/L - 45 -  AST 15 - 41 U/L - 39 -  ALT 17 - 63 U/L - 18 -   Lab Results  Component Value Date   HGBA1C 5.8 (H) 10/24/2016       Ct Abdomen Pelvis Wo Contrast  Result Date: 10/23/2016 CLINICAL DATA:  Abdominal pain with diarrhea for 2-3 days EXAM: CT ABDOMEN AND PELVIS WITHOUT CONTRAST TECHNIQUE: Multidetector CT imaging of the abdomen and pelvis was performed following the standard protocol without IV contrast. COMPARISON:  None. FINDINGS: Lower chest: No acute abnormality. Hepatobiliary: Liver is diminutive in size with a nodular contour consistent with cirrhosis. Large volume abdominal ascites. Cholelithiasis without biliary dilatation. Pancreas: Unremarkable. No pancreatic ductal dilatation or surrounding inflammatory changes. Spleen: Normal in size without  focal abnormality. Adrenals/Urinary Tract: Normal adrenal glands. No urolithiasis with or obstructive uropathy. 1.7 x 1.9 cm hypodense, fluid attenuating right interpolar renal mass most consistent with a cyst. 5.4 x 4.8 cm hypodense, fluid attenuating left renal mass most consistent with a cyst. 13 x 9 mm hyperdense exophytic left posterior interpolar renal mass likely reflecting a hemorrhagic or proteinaceous cyst but is technically indeterminate. Normal bladder. Stomach/Bowel: No bowel dilatation. Mild relative bowel wall thickening throughout the colon which may reflect reactive edema secondary to ascites versus mild colitis. No pneumatosis, pneumoperitoneum or portal venous gas. Normal appendix. Vascular/Lymphatic: Normal caliber abdominal aorta with atherosclerosis. No lymphadenopathy. Reproductive: Prostate is unremarkable. Other: No other fluid collection or hematoma. Musculoskeletal: No lytic or sclerotic osseous lesion. Diffuse thoracolumbar spine spondylosis. IMPRESSION: 1. Cirrhosis with large volume ascites. 2. Mild relative bowel wall thickening throughout the colon which may reflect reactive edema secondary to ascites versus mild colitis. 3. Cholelithiasis. 4. Bilateral renal cysts. Electronically Signed   By: Elige KoHetal  Patel   On: 10/23/2016 21:14   [place] Renal  Result Date: 10/31/2016 CLINICAL DATA:  Acute kidney injury. EXAM: RENAL / URINARY TRACT ULTRASOUND COMPLETE COMPARISON:  Ultrasound of September 01, 2016. FINDINGS: Right Kidney: Length: 13.2 cm. Echogenicity within normal limits. No mass or hydronephrosis visualized. Left Kidney: Length: 13.5 cm. 5.2 cm simple cyst is seen in lower pole. 2.3 cm simple cyst is seen in upper pole. Echogenicity within normal limits. No  mass or hydronephrosis visualized. Bladder: Appears normal for degree of bladder distention. Ascites is noted. IMPRESSION: Ascites.  Simple left renal cysts.  No other abnormality seen. Electronically Signed   By: Lupita Raider,  M.D.   On: 10/31/2016 09:37   Mr Foot Left Wo Contrast  Result Date: 10/24/2016 CLINICAL DATA:  Nonhealing ulcer at the ball of the foot below were the great toe use to be. Query osteomyelitis. EXAM: MRI OF THE LEFT FOOT WITHOUT CONTRAST TECHNIQUE: Multiplanar, multisequence MR imaging of the left forefoot was performed. No intravenous contrast was administered. COMPARISON:  CT left lower extremity 04/06/2010 FINDINGS: Bones/Joint/Cartilage Previous amputation of the great toe at the base of the proximal phalanx. Somewhat irregular appearance of the first metatarsal head with increased marrow signal intensity in the distal first metatarsal head as well as in the remaining proximal phalangeal stump. Focal subperiosteal signal intensity along the metatarsal hand. Changes suggest focal osteomyelitis. Scattered marrow signal intensity changes in the intertarsal bones probably represent degenerative change. Hammertoe deformities. Muscles and Tendons Visualized flexor and extensor tendons appear intact with normal signal intensity. Edema in the plantar musculature of the forefoot suggesting inflammatory change. Soft tissues Subcutaneous soft tissue edema around the first metatarsal head and stump with focal ulceration inferior and lateral to the metatarsal head suggesting cellulitis. No loculated fluid collection to suggest abscess. IMPRESSION: Previous amputation of the right first toe. Soft tissue edema consistent with cellulitis and ulceration at the stomach and over the first metatarsal head. Edema in the plantar musculature. Focal bone erosion and marrow signal changes at the distal first metatarsal head and involving the proximal phalangeal stump likely indicates focal osteomyelitis. Electronically Signed   By: Burman Nieves M.D.   On: 10/24/2016 06:28   US Paracentesis  Result Date: 10/24/2016 INDICATION: New onset abdominal pain with new findings of ascites. Request is made for diagnostic and therapeutic  paracentesis. EXAM: ULTRASOUND GUIDED DIAGNOSTIC AND THERAPEUTIC PARACENTESIS MEDICATIONS: 1% lidocaine. COMPLICATIONS: None immediate. PROCEDURE: Informed written consent was obtained from the patient after a discussion of the risks, benefits and alternatives to treatment. A timeout was performed prior to the initiation of the procedure. Initial ultrasound scanning demonstrates a small amount of ascites within the right lower abdominal quadrant. The right lower abdomen was prepped and draped in the usual sterile fashion. 1% lidocaine was used for local anesthesia. Following this, a 19 gauge, 7-cm, Yueh catheter was introduced. An ultrasound image was saved for documentation purposes. The paracentesis was performed. The catheter was removed and a dressing was applied. The patient tolerated the procedure well without immediate post procedural complication. FINDINGS: A total of approximately 3.3 L of yellow fluid was removed. Samples were sent to the laboratory as requested by the clinical team. IMPRESSION: Successful ultrasound-guided paracentesis yielding 3.3 liters of peritoneal fluid. Read by: Barnetta Chapel, PA-C Electronically Signed   By: Simonne Come M.D.   On: 10/24/2016 11:23   Dg Abd Portable 1v  Result Date: 10/30/2016 CLINICAL DATA:  Nausea and vomiting. Cholelithiasis and hypertension. EXAM: PORTABLE ABDOMEN - 1 VIEW COMPARISON:  CT abdomen from 10/23/2016 FINDINGS: Moderate gaseous distention of the stomach possibly representing gastroparesis. Scattered air containing nonobstructed, nondistended large bowel. No significant small bowel dilatation. Calcification in the left upper quadrant is noted not clearly identified on the prior CT. It does not appear to represent nephrolithiasis. No definite calcified lymph node is seen on prior CT. Splenic vascular calcification might account for this. Mild degenerate change of the dorsal spine.  IMPRESSION: Moderate gaseous distention of the stomach which may  represent gastroparesis. No obstructive bowel gas pattern. No definite free air. Electronically Signed   By: Tollie Eth M.D.   On: 10/30/2016 20:24   Dg Foot 2 Views Left  Result Date: 10/24/2016 CLINICAL DATA:  Osteomyelitis EXAM: LEFT FOOT - 2 VIEW COMPARISON:  MRI 10/24/2016 FINDINGS: Changes of prior amputation of the left great toe. Lucency/irregularity within the distal aspect of the left first metatarsal may reflect area of bone destruction and osteomyelitis. No additional acute bony abnormality. Prior internal fixation of an old healed fifth metatarsal fracture. IMPRESSION: Lucency in the distal left first metatarsal, possibly area of osteomyelitis. Electronically Signed   By: Charlett Nose M.D.   On: 10/24/2016 17:44   US Abdomen Limited Ruq  Result Date: 11/02/2016 CLINICAL DATA:  Hepatic cirrhosis. EXAM: US ABDOMEN LIMITED - RIGHT UPPER QUADRANT COMPARISON:  Ultrasound of October 31, 2016. FINDINGS: Gallbladder: 1.5 cm gallstone is noted. No gallbladder wall thickening or pericholecystic fluid is noted. No sonographic Murphy's sign is noted. Common bile duct: Not visualized. Liver: Liver is small with nodular margins consistent with hepatic cirrhosis. Ascites is noted. IMPRESSION: Ascites. Solitary gallstone without gallbladder wall thickening or pericholecystic fluid. Findings consistent with hepatic cirrhosis. Electronically Signed   By: Lupita Raider, M.D.   On: 11/02/2016 21:47     Assessment/Plan   ICD-9-CM ICD-10-CM   1. Advanced directives, counseling/discussion V65.49 Z71.89   2. Physical deconditioning 799.3 R53.81   3. End stage liver disease (HCC) 572.8 K72.90   4. Cirrhosis of liver with ascites, unspecified hepatic cirrhosis type (HCC) 571.5 K74.60    non alcoholic  5. Secondary esophageal varices without bleeding (HCC) 456.21 I85.10   6. Controlled type 2 diabetes mellitus with complication, with long-term current use of insulin (HCC) 250.90 E11.8    V58.67 Z79.4   7.  AKI (acute kidney injury) (HCC) 584.9 N17.9   8. Thrombocytopenia (HCC) 287.5 D69.6   9. G6PD deficiency anemia (HCC) 282.2 D55.0   10. Moderate malnutrition (HCC) 263.0 E44.0   11. Great toe amputation status, unspecified laterality (HCC) V49.71 Z89.419    left  12. Coagulopathy (HCC) 286.9 D68.9    Check CBC, CMP  Palliative care referral for ESLD  Advanced directives discussed. Pt desires to change code status to FULL CODE despite ESLD.   Cont current meds as ordered  PT/OT as ordered  Wound vac care as indicated. He will require at least 3 weeks of care. Per d/c summary, needs to be changed on Mon, Wed and Fri's  Wound care to left 1st ray amputation site  Nutritional supplements as ordered  May need repeat paracentesis in 1 week. Daily weight and record  F/u with GI as scheduled  CBGs as ordered  GOAL: short term rehab and d/c home when medically appropriate. Prognosis is poor due to ESLD.  Communicated with pt and nursing.  Will follow  Loyal Rudy S. Ancil Linsey  [facility] [address] [place], [place] [address] [phone] Cell (Monday-Friday 8 AM - 5 PM) [phone] After 5 PM and follow prompts

## 2016-11-16 ENCOUNTER — Telehealth: Payer: Self-pay | Admitting: Gastroenterology

## 2016-11-16 DIAGNOSIS — K7460 Unspecified cirrhosis of liver: Secondary | ICD-10-CM

## 2016-11-16 LAB — CBC AND DIFFERENTIAL
HCT: 26 % — AB (ref 41–53)
HEMOGLOBIN: 8.6 g/dL — AB (ref 13.5–17.5)
Neutrophils Absolute: 3 /uL
PLATELETS: 43 10*3/uL — AB (ref 150–399)

## 2016-11-16 LAB — BASIC METABOLIC PANEL
BUN: 32 mg/dL — AB (ref 4–21)
CREATININE: 1.2 mg/dL (ref 0.6–1.3)
GLUCOSE: 136 mg/dL
POTASSIUM: 4.2 mmol/L (ref 3.4–5.3)
SODIUM: 134 mmol/L — AB (ref 137–147)

## 2016-11-16 NOTE — Telephone Encounter (Signed)
Sana calling from [facility] calling wanting to verify orders Dr.Jacobs sent in for pt. Best call back # is [phone].

## 2016-11-16 NOTE — Telephone Encounter (Signed)
I spoke with the nurse Eloisa NorthernSana and she took a verbal order for BMET and will fax the results to our office.

## 2016-11-16 NOTE — Telephone Encounter (Signed)
The pt advises that his weight is up to 260 lb today and states he has fluid in his abdomen, thighs and feet.  [facility] and states it is difficult for him to engage in rehab due to the abdominal swelling.    Verbal consult with Doug SouJessica Zehr, obtain BMET before any paracentesis is scheduled.  [facility].  Pt advised that no more than 4 liters can be removed.  Order for BMET to be faxed to [facility]

## 2016-11-17 ENCOUNTER — Other Ambulatory Visit: Payer: Self-pay

## 2016-11-17 ENCOUNTER — Encounter: Payer: Self-pay | Admitting: Adult Health

## 2016-11-17 ENCOUNTER — Non-Acute Institutional Stay (SKILLED_NURSING_FACILITY): Payer: BC Managed Care – PPO | Admitting: Adult Health

## 2016-11-17 DIAGNOSIS — K7460 Unspecified cirrhosis of liver: Secondary | ICD-10-CM

## 2016-11-17 DIAGNOSIS — R188 Other ascites: Principal | ICD-10-CM

## 2016-11-17 NOTE — Progress Notes (Signed)
Location:   [facility] Room Number: 109 A Place of Service:  SNF (31)   CODE STATUS: DNR  No Known Allergies  Chief Complaint  Patient presents with  . Acute Visit    Renal Failure    HPI:  He is complaining of increased ascites. He tells me that he is going to need a paracentesis in order to better manage his swelling. He tells me that he has edema in his scrotum and stump   Past Medical History:  Diagnosis Date  . Arthritis   . Asthma   . Bilateral cataracts   . Blurred vision    improved now  . Cholelithiasis   . Diabetes mellitus    type II  . Eczema    inner wrist  . Hypertension   . Muscle cramps   . Necrotizing fasciitis (HCC)   . Neuropathy (HCC)   . Rash    wrist and hands since late june  . Retinal degeneration     Past Surgical History:  Procedure Laterality Date  . ABOVE KNEE LEG AMPUTATION     right  . APPLICATION OF WOUND VAC Left 11/06/2016   Procedure: APPLICATION OF WOUND VAC;  Surgeon: Eldred Manges, MD;  Location: [facility] OR;  Service: Orthopedics;  Laterality: Left;  . COLONOSCOPY WITH PROPOFOL N/A 05/18/2016   Procedure: COLONOSCOPY WITH PROPOFOL;  Surgeon: Rachael Fee, MD;  Location: [facility] ENDOSCOPY;  Service: Endoscopy;  Laterality: N/A;  . ESOPHAGOGASTRODUODENOSCOPY (EGD) WITH PROPOFOL N/A 05/18/2016   Procedure: ESOPHAGOGASTRODUODENOSCOPY (EGD) WITH PROPOFOL;  Surgeon: Rachael Fee, MD;  Location: [facility];  Service: Endoscopy;  Laterality: N/A;  . ESOPHAGOGASTRODUODENOSCOPY (EGD) WITH PROPOFOL N/A 11/01/2016   Procedure: ESOPHAGOGASTRODUODENOSCOPY (EGD) WITH PROPOFOL;  Surgeon: Napoleon Form, MD;  Location: [facility];  Service: Endoscopy;  Laterality: N/A;  . EYE SURGERY  09/2011   bilateral for retinopathy - laser eye surgery  . I&D EXTREMITY Left 10/25/2016   Procedure: LEFT FOOT DEBRIDEMENT RAY AMPUTATION PLACEMENT OF WOUND VAC;  Surgeon: Eldred Manges, MD;  Location: [facility] OR;  Service: Orthopedics;  Laterality: Left;   . I&D EXTREMITY Left 11/06/2016   Procedure: IRRIGATION AND DEBRIDEMENT EXTREMITY/LEFT FOOT;  Surgeon: Eldred Manges, MD;  Location: [facility] OR;  Service: Orthopedics;  Laterality: Left;  . left metatarsal  2003   5th  . TOE AMPUTATION     Left big toe    Social History   Social History  . Marital status: Married    Spouse name: N/A  . Number of children: N/A  . Years of education: N/A   Occupational History  . Not on file.   Social History Main Topics  . Smoking status: Never Smoker  . Smokeless tobacco: Never Used  . Alcohol use No  . Drug use: No  . Sexual activity: Not on file     Comment: computer work. next of kin. Sister Marisue Humble next of kin   Other Topics Concern  . Not on file   Social History Narrative  . No narrative on file   Family History  Problem Relation Age of Onset  . Cancer Mother     unknown type      VITAL SIGNS BP 120/60   Pulse 78   Temp 98.3 F (36.8 C)   Resp 17   Ht 6\' 1"  (1.854 m)   Wt 259 lb 12.8 oz (117.8 kg)   SpO2 98%   BMI 34.28 kg/m   Patient's Medications  New Prescriptions   No medications on file  Previous Medications   AMINO ACIDS-PROTEIN HYDROLYS (FEEDING SUPPLEMENT, PRO-STAT SUGAR FREE 64,) LIQD    Take 30 mLs by mouth 3 (three) times daily.   ATORVASTATIN (LIPITOR) 40 MG TABLET    Take 40 mg by mouth daily.   BISMUTH TRIBROMOPH-PETROLATUM EX    Apply to right foot topically every Tuesday, Thursday and Saturday   BRIMONIDINE (ALPHAGAN P) 0.1 % SOLN    Place 1 drop into both eyes daily.   COLLAGENASE (SANTYL) OINTMENT    Apply to right heel topically on Tuesday, Thursday and Saturday   FUROSEMIDE (LASIX) 20 MG TABLET    Take 1 tablet (20 mg total) by mouth daily.   INSULIN ASPART (NOVOLOG PENFILL) CARTRIDGE    Inject before meals and at bedtime as per sliding scale: if 200-250 = 2 units, 251-300 = 4 units, 301-350 = 6 units, 351-400 = 8 units, if greater than 400 notify MD   LOPERAMIDE (IMODIUM) 2 MG CAPSULE    Take 1  capsule (2 mg total) by mouth as needed for diarrhea or loose stools.   METHOCARBAMOL (ROBAXIN) 500 MG TABLET    Take 0.5 tablets (250 mg total) by mouth every 8 (eight) hours as needed for muscle spasms.   METOCLOPRAMIDE (REGLAN) 5 MG TABLET    Take 1 tablet (5 mg total) by mouth every 6 (six) hours as needed for nausea.   MULTIPLE VITAMIN (MULTIVITAMIN WITH MINERALS) TABS TABLET    Take 1 tablet by mouth daily.   NADOLOL (CORGARD) 20 MG TABLET    Take 1 tablet (20 mg total) by mouth daily.   NYSTATIN (MYCOSTATIN/NYSTOP) POWDER    Apply topically 2 (two) times daily.   OXYCODONE (OXY IR/ROXICODONE) 5 MG IMMEDIATE RELEASE TABLET    Take 1 tablet (5 mg total) by mouth every 4 (four) hours as needed for severe pain.   PANTOPRAZOLE (PROTONIX) 40 MG TABLET    Take 1 tablet (40 mg total) by mouth 2 (two) times daily.   SACCHAROMYCES BOULARDII (FLORASTOR) 250 MG CAPSULE    Take 1 capsule (250 mg total) by mouth 2 (two) times daily.   SPIRONOLACTONE (ALDACTONE) 50 MG TABLET    Take 50 mg by mouth daily.   SUCRALFATE (CARAFATE) 1 GM/10ML SUSPENSION    Take 10 mLs (1 g total) by mouth 4 (four) times daily -  with meals and at bedtime.   UNABLE TO FIND    Med Pass 237 ml by mouth 3 times daily   VITAMIN C (ASCORBIC ACID) 500 MG TABLET    Take 500 mg by mouth 2 (two) times daily.   ZINC SULFATE (ZINC-220 PO)    Take 1 tablet by mouth daily.  Modified Medications   No medications on file  Discontinued Medications   FEEDING SUPPLEMENT, GLUCERNA SHAKE, (GLUCERNA SHAKE) LIQD    Take 237 mLs by mouth 3 (three) times daily between meals.   INSULIN ASPART (NOVOLOG) 100 UNIT/ML INJECTION    Inject 0-9 Units into the skin 4 (four) times daily -  with meals and at bedtime.   SPIRONOLACTONE (ALDACTONE) 25 MG TABLET    Take 2 tablets (50 mg total) by mouth daily.     SIGNIFICANT DIAGNOSTIC EXAMS   10-24-16: mri left foot: Previous amputation of the right first toe. Soft tissue edema consistent with cellulitis and  ulceration at the stomach and over the first metatarsal head. Edema in the plantar musculature. Focal bone erosion and marrow  signal changes at the distal first metatarsal head and involving the proximal phalangeal stump likely indicates focal osteomyelitis.  10-31-16: renal ultrasound: Ascites. Simple left renal cysts. No other abnormality seen.  11-02-16: abdominal ultrasound: Ascites. Solitary gallstone without gallbladder wall thickening or pericholecystic fluid. Findings consistent with hepatic cirrhosis.    LABS REVIEWED:   10-31-16: pre-albumin <5 11-10-16; wbc 3.9; hgb 8.6; hct 25.5 ;mcv 102.5; plt 43; glucose 156; bun 32.1; creat 1.31; k+ 4.1; na++ 137 total bili 2.7; ast 36; albumin 2.7 11-15-16: wbc 4.1; hgb 9.6; hct 29.6; mcv 104.4; plt 55; glucose 121; bun 34.1; creat 1.15; k+ 3.8; na++ 136; total bili 3.0; ast 44; albumin 2.2  11-16-16: glucose 136; bun 32.0; creat 1.17; k+ 4.2; na++ 134     Review of Systems  Constitutional: Negative for malaise/fatigue.  Respiratory: negative for cough Negative for shortness of breath.   Cardiovascular: Negative for chest pain, palpitations and leg swelling.  Gastrointestinal: Negative for abdominal pain, constipation and heartburn. Has ascites Musculoskeletal: Negative for back pain, joint pain and myalgias.  Skin: Negative.   Neurological: Negative for dizziness.  Endo/Heme/Allergies:       Is cold   Psychiatric/Behavioral: The patient is not nervous/anxious.    Physical Exam  Constitutional: He is oriented to person, place, and time. No distress.  Eyes: Conjunctivae are normal.  Neck: Neck supple. No JVD present. No thyromegaly present.  Cardiovascular: Normal rate, regular rhythm and intact distal pulses.   Respiratory: Effort normal. No respiratory distress. He has no wheezes. He has rales.  Breath sounds diminished   GI: Soft. Bowel sounds are normal. He exhibits no distension. There is no tenderness.  Has ascites     Musculoskeletal: He exhibits no edema.  Able to move all extremities He has scrotal edema Stump edema    Lymphadenopathy:    He has no cervical adenopathy.  Neurological: He is alert and oriented to person, place, and time.  Skin: Skin is warm and dry. He is not diaphoretic. There is pallor.  Left heel: 1.7 x 2.9 cm Left lateral foot: 5.0 x 3.2 x 1.8 cm suture intact   Psychiatric: He has a normal mood and affect.      ASSESSMENT/ PLAN:  1. Cirrhosis of liver with ascites: will setup a paracentesis with albumin transfusion and will increase his aldactone to 75 mg daily   MD is aware of resident's narcotic use and is in agreement with current plan of care. We will attempt to wean resident as apropriate   Synthia Innocenteborah Green NP [facility]  Contact [phone] Monday through Friday 8am- 5pm  After hours call [phone]

## 2016-11-17 NOTE — Telephone Encounter (Signed)
[facility] our [facility] verbal order for max 4 liters removed give albumin and send fluid for cell count repeat BMET next week the day [facility]  Message left on the pt number to return call.  [facility] and [facility] am for 10 am appt.  Eloisa NorthernSana was also given the BMET order for the day prior to the appt

## 2016-11-23 ENCOUNTER — Ambulatory Visit (HOSPITAL_COMMUNITY)
Admission: RE | Admit: 2016-11-23 | Discharge: 2016-11-23 | Disposition: A | Discharge status: Home / Self Care | Payer: BC Managed Care – PPO | Source: Ambulatory Visit | Attending: Hematology and Oncology | Admitting: Hematology and Oncology

## 2016-11-23 ENCOUNTER — Ambulatory Visit (HOSPITAL_COMMUNITY)
Admission: RE | Admit: 2016-11-23 | Discharge: 2016-11-23 | Disposition: A | Discharge status: Home / Self Care | Payer: BC Managed Care – PPO | Source: Ambulatory Visit | Attending: Gastroenterology | Admitting: Gastroenterology

## 2016-11-23 ENCOUNTER — Encounter (HOSPITAL_COMMUNITY): Payer: Self-pay

## 2016-11-23 DIAGNOSIS — R188 Other ascites: Secondary | ICD-10-CM | POA: Insufficient documentation

## 2016-11-23 DIAGNOSIS — K7460 Unspecified cirrhosis of liver: Secondary | ICD-10-CM | POA: Insufficient documentation

## 2016-11-23 LAB — BODY FLUID CELL COUNT WITH DIFFERENTIAL
Eos, Fluid: 0 %
Lymphs, Fluid: 13 %
Monocyte-Macrophage-Serous Fluid: 67 % (ref 50–90)
Neutrophil Count, Fluid: 20 % (ref 0–25)
Total Nucleated Cell Count, Fluid: 263 cu mm (ref 0–1000)

## 2016-11-23 LAB — PATHOLOGIST SMEAR REVIEW

## 2016-11-23 MED ORDER — SODIUM CHLORIDE 0.9 % IV SOLN
INTRAVENOUS | Status: DC
  Administered 2016-11-23: 14:00:00 via INTRAVENOUS

## 2016-11-23 MED ORDER — ALBUMIN HUMAN 25 % IV SOLN
40.0000 g | Freq: Once | INTRAVENOUS | Status: AC
  Administered 2016-11-23: 40 g via INTRAVENOUS
  Filled 2016-11-23: qty 200

## 2016-11-23 NOTE — Procedures (Signed)
Ultrasound-guided diagnostic and therapeutic paracentesis performed yielding 4 liters (maximum ordered) of yellow fluid. No immediate complications. A portion of the fluid was submitted to the laboratory for preordered studies. The patient will receive IV albumin infusion post procedure.

## 2016-11-23 NOTE — Progress Notes (Signed)
Pt tolerated albumin well. No more shivering after the one time documented previously. Called Jeananne RamaKevin Allred PA informed him patient feels fine and VSS. Order for d/c to home given. Escorted patient to his transportation van via w/c.

## 2016-11-23 NOTE — Progress Notes (Signed)
C/O shivering while almost halfway through albumin. Lasted only a moment. Albumin slowed to 100cc /hr

## 2016-11-23 NOTE — Discharge Instructions (Signed)
Call MD for any problems.      Paracentesis, Care After Refer to this sheet in the next few weeks. These instructions provide you with information about caring for yourself after your procedure. Your health care provider may also give you more specific instructions. Your treatment has been planned according to current medical practices, but problems sometimes occur. Call your health care provider if you have any problems or questions after your procedure. What can I expect after the procedure? After your procedure, it is common to have a small amount of clear fluid coming from the puncture site. Follow these instructions at home:  Return to your normal activities as told by your health care provider. Ask your health care provider what activities are safe for you.  Take over-the-counter and prescription medicines only as told by your health care provider.  Do not take baths, swim, or use a hot tub until your health care provider approves.  Follow instructions from your health care provider about:  How to take care of your puncture site.  When and how you should change your bandage (dressing).  When you should remove your dressing.  Check your puncture area every day signs of infection. Watch for:  Redness, swelling, or pain.  Fluid, blood, or pus.  Keep all follow-up visits as told by your health care provider. This is important. Contact a health care provider if:  You have redness, swelling, or pain at your puncture site.  You start to have more clear fluid coming from your puncture site.  You have blood or pus coming from your puncture site.  You have chills.  You have a fever. Get help right away if:  You develop chest pain or shortness of breath.  You develop increasing pain, discomfort, or swelling in your abdomen.  You feel dizzy or light-headed or you pass out. This information is not intended to replace advice given to you by your health care provider. Make  sure you discuss any questions you have with your health care provider. Document Released: 01/19/2015 Document Revised: 02/10/2016 Document Reviewed: 11/17/2014 Elsevier Interactive Patient Education  2017 Elsevier Inc.      Albumin injection What is this medicine? ALBUMIN (al BYOO min) is used to treat or prevent shock following serious injury, bleeding, surgery, or burns by increasing the volume of blood plasma. This medicine can also replace low blood protein. This medicine may be used for other purposes; ask your health care provider or pharmacist if you have questions. COMMON BRAND NAME(S): Albuked, Albumarc, Albuminar, AlbuRx, Albutein, Buminate, Flexbumin, Kedbumin, Macrotec, Plasbumin What should I tell my health care provider before I take this medicine? They need to know if you have any of the following conditions: -anemia -heart disease -kidney disease -an unusual or allergic reaction to albumin, other medicines, foods, dyes, or preservatives -pregnant or trying to get pregnant -breast-feeding How should I use this medicine? This medicine is for infusion into a vein. It is given by a health-care professional in a hospital or clinic. Talk to your pediatrician regarding the use of this medicine in children. While this drug may be prescribed for selected conditions, precautions do apply. Overdosage: If you think you have taken too much of this medicine contact a poison control center or emergency room at once. NOTE: This medicine is only for you. Do not share this medicine with others. What if I miss a dose? This does not apply. What may interact with this medicine? Interactions are not expected. This list may  not describe all possible interactions. Give your health care provider a list of all the medicines, herbs, non-prescription drugs, or dietary supplements you use. Also tell them if you smoke, drink alcohol, or use illegal drugs. Some items may interact with your  medicine. What should I watch for while using this medicine? Your condition will be closely monitored while you receive this medicine. Some products are derived from human plasma, and there is a small risk that these products may contain certain types of virus or bacteria. All products are processed to kill most viruses and bacteria. If you have questions concerning the risk of infections, discuss them with your doctor or health care professional. What side effects may I notice from receiving this medicine? Side effects that you should report to your doctor or health care professional as soon as possible: -allergic reactions like skin rash, itching or hives, swelling of the face, lips, or tongue -breathing problems -changes in heartbeat -fever, chills -pain, redness or swelling at the injection site -signs of viral infection including fever, drowsiness, chills, runny nose followed in about 2 weeks by a rash and joint pain -tightness in the chest Side effects that usually do not require medical attention (report to your doctor or health care professional if they continue or are bothersome): -increased salivation -nausea, vomiting This list may not describe all possible side effects. Call your doctor for medical advice about side effects. You may report side effects to FDA at 1-800-FDA-1088. Where should I keep my medicine? This does not apply. You will not be given this medicine to store at home. NOTE: This sheet is a summary. It may not cover all possible information. If you have questions about this medicine, talk to your doctor, pharmacist, or health care provider.  2018 Elsevier/Gold Standard (2007-11-28 10:18:55)

## 2016-11-24 ENCOUNTER — Ambulatory Visit (INDEPENDENT_AMBULATORY_CARE_PROVIDER_SITE_OTHER): Payer: BC Managed Care – PPO | Admitting: Gastroenterology

## 2016-11-24 ENCOUNTER — Encounter: Payer: Self-pay | Admitting: Gastroenterology

## 2016-11-24 VITALS — BP 110/66 | HR 73

## 2016-11-24 DIAGNOSIS — R600 Localized edema: Secondary | ICD-10-CM

## 2016-11-24 DIAGNOSIS — K7460 Unspecified cirrhosis of liver: Secondary | ICD-10-CM | POA: Diagnosis not present

## 2016-11-24 DIAGNOSIS — R188 Other ascites: Principal | ICD-10-CM

## 2016-11-24 NOTE — Patient Instructions (Addendum)
If you are age 67 or older, your body mass index should be between 23-30. Your There is no height or weight on file to calculate BMI. If this is out of the aforementioned range listed, please consider follow up with your Primary Care Provider.  If you are age 67 or younger, your body mass index should be between 19-25. Your There is no height or weight on file to calculate BMI. If this is out of the aformentioned range listed, please consider follow up with your Primary Care Provider.   Increase your lasix to 40 mg a day.  Increase Lasix to 100mg  a day.  Thank you for choosing [facility]

## 2016-11-24 NOTE — Progress Notes (Signed)
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  S/p paracentesis 3/8 with 4 Liters removed, negative for SBP, was given albumin.  During recent hospitalization for osteomyelitis and some other issues he had some acute kidney injury. Was placed back on Lasix 20 mg daily and spironolactone 50 mg daily. Appears to be doing well with that from a renal standpoint with most recent Cr 1.17.  AFP and ultrasound in 10/2016 negative for hepatoma.  EGD during hospitalization revealed many superficial esophageal ulcers with no stigmata of recent bleeding but patchy mucosal necrosis was found 25 to 40 cm from incisions.  Grade I varices, severe portal hypertensive gastropathy and many friable superficial duodenal ulcers found in entire duodenum, probably NSAID induced.  Duodenal biopsies showed benign small bowel with erosion and necrosis.  Is on pantoprazole 40 mg twice daily and Carafate suspension 4 times a day. Continues on nadolol 20 mg daily as well. He denies any further issues of nausea, vomiting, diarrhea. No abdominal pain. Actually is feeling well overall. Feels much better after having some fluid removed by paracentesis yesterday. That was preventing him from participating in physical therapy.  He is currently in rehabilitation facility for osteomyelitis of his left foot. Has a wound VAC in place.  Past Medical History:  Diagnosis Date  . Arthritis   . Asthma   . Bilateral cataracts   . Blurred vision    improved now  . Cholelithiasis   . Diabetes mellitus    type II  . Eczema    inner wrist  . Hypertension   . Muscle cramps   . Necrotizing fasciitis (HCC)   . Neuropathy (HCC)   . Rash    wrist and hands since late june  . Retinal degeneration    Past Surgical History:  Procedure Laterality Date  . ABOVE KNEE LEG AMPUTATION     right  . APPLICATION  OF WOUND VAC Left 11/06/2016   Procedure: APPLICATION OF WOUND VAC;  Surgeon: Eldred Manges, MD;  Location: [facility] OR;  Service: Orthopedics;  Laterality: Left;  . COLONOSCOPY WITH PROPOFOL N/A 05/18/2016   Procedure: COLONOSCOPY WITH PROPOFOL;  Surgeon: Rachael Fee, MD;  Location: [facility];  Service: Endoscopy;  Laterality: N/A;  . ESOPHAGOGASTRODUODENOSCOPY (EGD) WITH PROPOFOL N/A 05/18/2016   Procedure: ESOPHAGOGASTRODUODENOSCOPY (EGD) WITH PROPOFOL;  Surgeon: Rachael Fee, MD;  Location: [facility];  Service: Endoscopy;  Laterality: N/A;  . ESOPHAGOGASTRODUODENOSCOPY (EGD) WITH PROPOFOL N/A 11/01/2016   Procedure: ESOPHAGOGASTRODUODENOSCOPY (EGD) WITH PROPOFOL;  Surgeon: Napoleon Form, MD;  Location: [facility];  Service: Endoscopy;  Laterality: N/A;  . EYE SURGERY  09/2011   bilateral for retinopathy - laser eye surgery  . I&D EXTREMITY Left 10/25/2016   Procedure: LEFT FOOT DEBRIDEMENT RAY AMPUTATION PLACEMENT OF WOUND VAC;  Surgeon: Eldred Manges, MD;  Location: [facility] OR;  Service: Orthopedics;  Laterality: Left;  . I&D EXTREMITY Left 11/06/2016   Procedure: IRRIGATION AND DEBRIDEMENT EXTREMITY/LEFT FOOT;  Surgeon: Eldred Manges, MD;  Location: [facility] OR;  Service: Orthopedics;  Laterality: Left;  . left metatarsal  2003   5th  . TOE AMPUTATION     Left big toe    reports that he has never smoked. He has never used smokeless tobacco. He reports that he does not drink alcohol or use drugs. family history includes Cancer in his mother. No  Known Allergies    Outpatient Encounter Prescriptions as of 11/24/2016  Medication Sig  . Amino Acids-Protein Hydrolys (FEEDING SUPPLEMENT, PRO-STAT SUGAR FREE 64,) LIQD Take 30 mLs by mouth 3 (three) times daily.  Marland Kitchen. atorvastatin (LIPITOR) 40 MG tablet Take 40 mg by mouth daily.  Marland Kitchen. BISMUTH TRIBROMOPH-PETROLATUM EX Apply to right foot topically every Tuesday, Thursday and Saturday  . brimonidine (ALPHAGAN P) 0.1 % SOLN Place 1 drop into both eyes daily.   . collagenase (SANTYL) ointment Apply to right heel topically on Tuesday, Thursday and Saturday  . furosemide (LASIX) 20 MG tablet Take 1 tablet (20 mg total) by mouth daily.  Marland Kitchen. loperamide (IMODIUM) 2 MG capsule Take 1 capsule (2 mg total) by mouth as needed for diarrhea or loose stools.  . methocarbamol (ROBAXIN) 500 MG tablet Take 0.5 tablets (250 mg total) by mouth every 8 (eight) hours as needed for muscle spasms.  . metoCLOPramide (REGLAN) 5 MG tablet Take 1 tablet (5 mg total) by mouth every 6 (six) hours as needed for nausea.  . Multiple Vitamin (MULTIVITAMIN WITH MINERALS) TABS tablet Take 1 tablet by mouth daily.  . nadolol (CORGARD) 20 MG tablet Take 1 tablet (20 mg total) by mouth daily.  Marland Kitchen. nystatin (MYCOSTATIN/NYSTOP) powder Apply topically 2 (two) times daily.  . pantoprazole (PROTONIX) 40 MG tablet Take 1 tablet (40 mg total) by mouth 2 (two) times daily.  Marland Kitchen. saccharomyces boulardii (FLORASTOR) 250 MG capsule Take 1 capsule (250 mg total) by mouth 2 (two) times daily.  Marland Kitchen. spironolactone (ALDACTONE) 50 MG tablet Take 50 mg by mouth daily.  . sucralfate (CARAFATE) 1 GM/10ML suspension Take 10 mLs (1 g total) by mouth 4 (four) times daily -  with meals and at bedtime.  Marland Kitchen. UNABLE TO FIND Med Pass 237 ml by mouth 3 times daily  . vitamin C (ASCORBIC ACID) 500 MG tablet Take 500 mg by mouth 2 (two) times daily.  . Zinc Sulfate (ZINC-220 PO) Take 1 tablet by mouth daily.  . [DISCONTINUED] insulin aspart (NOVOLOG PENFILL) cartridge Inject before meals and at bedtime as per sliding scale: if 200-250 = 2 units, 251-300 = 4 units, 301-350 = 6 units, 351-400 = 8 units, if greater than 400 notify MD  . [DISCONTINUED] oxyCODONE (OXY IR/ROXICODONE) 5 MG immediate release tablet Take 1 tablet (5 mg total) by mouth every 4 (four) hours as needed for severe pain.   No facility-administered encounter medications on file as of 11/24/2016.      REVIEW OF SYSTEMS  : All other systems reviewed and negative  except where noted in the History of Present Illness.   PHYSICAL EXAM: BP 110/66   Pulse 73  General: Well developed white male in no acute distress; in wheelchair Head: Normocephalic and atraumatic Eyes:  Sclerae anicteric, conjunctiva pink. Ears: Normal auditory acuity Lungs: Clear throughout to auscultation Heart: Regular rate and rhythm Abdomen: Soft, non-distended.  Normal bowel sounds.  Non-tender. Musculoskeletal: Right AKA and left foot with bandage and wound vac. Skin: No lesions on visible extremities Extremities: B/L LE pitting edema. Neurological: Alert oriented x 4, grossly non-focal Psychological:  Alert and cooperative. Normal mood and affect  ASSESSMENT AND PLAN: 1. 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  S/p paracentesis 3/8 with 4 Liters removed, negative for SBP, was given albumin.  During recent hospitalization there was some acute kidney injury due to some other issues. Was placed back on Lasix 20 mg daily and spironolactone 50  mg daily. Appears to be doing well with that from a renal standpoint. We will increase his Lasix to 40 mg daily and spironolactone 100 mg daily. Want to recheck a BMP 4 days.  AFP and ultrasound February 2018 normal/negative.  Needs 2 gram sodium diet.  2. ABL. EGD in February revealed many superficial esophageal ulcers with no stigmata of recent bleeding but patchy mucosal necrosis was found 25 to 40 cm from incisions.  Grade I varices, severe portal hypertensive gastropathy and many friable superficial duodenal ulcers found in entire duodenum, probably NSAID induced.  Continue twice a day PPI and Carafate. Avoid NSAIDs. Will recheck CBC.  **Follow-up with Dr. Christella Hartigan in 4-8 weeks.  CC:  Adrian Prince, MD

## 2016-11-27 NOTE — Progress Notes (Signed)
I agree with the above note, plan 

## 2016-11-28 ENCOUNTER — Ambulatory Visit: Payer: BC Managed Care – PPO | Admitting: Gastroenterology

## 2016-11-28 LAB — HEPATIC FUNCTION PANEL
ALT: 16 U/L (ref 10–40)
AST: 41 U/L — AB (ref 14–40)
Alkaline Phosphatase: 72 U/L (ref 25–125)
Bilirubin, Total: 2.7 mg/dL

## 2016-11-28 LAB — BASIC METABOLIC PANEL
BUN: 30 mg/dL — AB (ref 4–21)
CREATININE: 1.4 mg/dL — AB (ref 0.6–1.3)
GLUCOSE: 128 mg/dL
POTASSIUM: 4.1 mmol/L (ref 3.4–5.3)
SODIUM: 134 mmol/L — AB (ref 137–147)

## 2016-11-28 LAB — CBC AND DIFFERENTIAL
HCT: 31 % — AB (ref 41–53)
HEMOGLOBIN: 10 g/dL — AB (ref 13.5–17.5)
Platelets: 71 10*3/uL — AB (ref 150–399)
WBC: 5.8 10^3/mL

## 2016-11-29 ENCOUNTER — Other Ambulatory Visit: Payer: Self-pay | Admitting: *Deleted

## 2016-11-29 MED ORDER — OXYCODONE HCL 5 MG PO TABS: ORAL_TABLET | ORAL | 0 refills | Status: DC

## 2016-11-29 NOTE — Telephone Encounter (Signed)
[facility] #: [phone] Fax#: [phone]  

## 2016-12-04 ENCOUNTER — Telehealth: Payer: Self-pay | Admitting: *Deleted

## 2016-12-04 ENCOUNTER — Ambulatory Visit (HOSPITAL_BASED_OUTPATIENT_CLINIC_OR_DEPARTMENT_OTHER): Payer: BC Managed Care – PPO | Admitting: Hematology and Oncology

## 2016-12-04 ENCOUNTER — Encounter: Payer: Self-pay | Admitting: Hematology and Oncology

## 2016-12-04 ENCOUNTER — Telehealth: Payer: Self-pay | Admitting: Hematology and Oncology

## 2016-12-04 ENCOUNTER — Other Ambulatory Visit (HOSPITAL_BASED_OUTPATIENT_CLINIC_OR_DEPARTMENT_OTHER): Payer: BC Managed Care – PPO

## 2016-12-04 VITALS — BP 129/67 | HR 78 | Temp 97.9°F | Resp 18 | Ht 73.0 in

## 2016-12-04 DIAGNOSIS — K7460 Unspecified cirrhosis of liver: Secondary | ICD-10-CM

## 2016-12-04 DIAGNOSIS — R188 Other ascites: Secondary | ICD-10-CM

## 2016-12-04 DIAGNOSIS — D61818 Other pancytopenia: Secondary | ICD-10-CM

## 2016-12-04 DIAGNOSIS — D696 Thrombocytopenia, unspecified: Secondary | ICD-10-CM | POA: Diagnosis not present

## 2016-12-04 DIAGNOSIS — K766 Portal hypertension: Principal | ICD-10-CM

## 2016-12-04 DIAGNOSIS — K3189 Other diseases of stomach and duodenum: Secondary | ICD-10-CM

## 2016-12-04 LAB — CBC & DIFF AND RETIC
BASO%: 0.2 % (ref 0.0–2.0)
BASOS ABS: 0 10*3/uL (ref 0.0–0.1)
EOS ABS: 0.7 10*3/uL — AB (ref 0.0–0.5)
EOS%: 7.1 % — AB (ref 0.0–7.0)
HEMATOCRIT: 30.5 % — AB (ref 38.4–49.9)
HEMOGLOBIN: 10.2 g/dL — AB (ref 13.0–17.1)
IMMATURE RETIC FRACT: 8 % (ref 3.00–10.60)
LYMPH#: 0.8 10*3/uL — AB (ref 0.9–3.3)
LYMPH%: 8.4 % — ABNORMAL LOW (ref 14.0–49.0)
MCH: 33.7 pg — ABNORMAL HIGH (ref 27.2–33.4)
MCHC: 33.4 g/dL (ref 32.0–36.0)
MCV: 100.7 fL — ABNORMAL HIGH (ref 79.3–98.0)
MONO#: 0.6 10*3/uL (ref 0.1–0.9)
MONO%: 5.9 % (ref 0.0–14.0)
NEUT#: 7.4 10*3/uL — ABNORMAL HIGH (ref 1.5–6.5)
NEUT%: 78.4 % — AB (ref 39.0–75.0)
PLATELETS: 115 10*3/uL — AB (ref 140–400)
RBC: 3.03 10*6/uL — ABNORMAL LOW (ref 4.20–5.82)
RDW: 20.9 % — AB (ref 11.0–14.6)
RETIC %: 2.95 % — AB (ref 0.80–1.80)
RETIC CT ABS: 89.39 10*3/uL (ref 34.80–93.90)
WBC: 9.4 10*3/uL (ref 4.0–10.3)

## 2016-12-04 LAB — COMPREHENSIVE METABOLIC PANEL
ALT: 19 U/L (ref 0–55)
ANION GAP: 11 meq/L (ref 3–11)
AST: 39 U/L — AB (ref 5–34)
Albumin: 2 g/dL — ABNORMAL LOW (ref 3.5–5.0)
Alkaline Phosphatase: 81 U/L (ref 40–150)
BILIRUBIN TOTAL: 3.14 mg/dL — AB (ref 0.20–1.20)
BUN: 31.8 mg/dL — AB (ref 7.0–26.0)
CALCIUM: 8.3 mg/dL — AB (ref 8.4–10.4)
CHLORIDE: 100 meq/L (ref 98–109)
CO2: 22 mEq/L (ref 22–29)
CREATININE: 1.6 mg/dL — AB (ref 0.7–1.3)
EGFR: 43 mL/min/{1.73_m2} — ABNORMAL LOW (ref >90)
Glucose: 140 mg/dl (ref 70–140)
Potassium: 4.4 mEq/L (ref 3.5–5.1)
Sodium: 133 mEq/L — ABNORMAL LOW (ref 136–145)
TOTAL PROTEIN: 7 g/dL (ref 6.4–8.3)

## 2016-12-04 LAB — FERRITIN: FERRITIN: 986 ng/mL — AB (ref 22–316)

## 2016-12-04 NOTE — Assessment & Plan Note (Addendum)
He has dependent edema and signs of abdominal ascites. He probably have evidence of third spacing from low protein status. I recommend he call GI service for direction to see whether he needs to take increased dose diuretic therapy or have repeat paracentesis.

## 2016-12-04 NOTE — Telephone Encounter (Signed)
Gave patient AVS and calender for April per 12/04/2016 los.

## 2016-12-04 NOTE — Assessment & Plan Note (Signed)
The cause of anemia is multifactorial, likely due to anemia chronic disease and chronic renal failure He does not need darbepoetin injection. I will see him on a yearly basis

## 2016-12-04 NOTE — Assessment & Plan Note (Signed)
The thrombocytopenia is likely related to liver disease and splenomegaly. He is not symptomatic. There is no contraindication to remain on aspirin as long as the platelet is greater than 50,000.   

## 2016-12-04 NOTE — Progress Notes (Signed)
[facility] OFFICE PROGRESS NOTE  Jeffery [name],Jeffery ALAN, Jeffery [name] SUMMARY OF HEMATOLOGIC HISTORY:  Jeffery BoydenKenneth [name] is here because of pancytopenia. The cause is likely due to liver cirrhosis and splenomegaly. He was being observed  INTERVAL HISTORY: Jeffery [name] returns for further follow-up. He was recently admitted to the hospital for management of osteomyelitis. He showed me some pictures. His wound is healing well and the wound VAC was just disconnected yesterday. The patient denies any recent signs or symptoms of bleeding such as spontaneous epistaxis, hematuria or hematochezia. He complained of abdominal swelling from ascites. He is quite uncomfortable at the right leg stump due to swelling.  He denies chest pain or shortness of breath  I have reviewed the past medical history, past surgical history, social history and family history with the patient and they are unchanged from previous note.  ALLERGIES:  has No Known Allergies.  MEDICATIONS:  Current Outpatient Prescriptions  Medication Sig Dispense Refill  . Amino Acids-Protein Hydrolys (FEEDING SUPPLEMENT, PRO-STAT SUGAR FREE 64,) LIQD Take 30 mLs by mouth 3 (three) times daily. 900 mL 0  . atorvastatin (LIPITOR) 40 MG tablet Take 40 mg by mouth daily.  2  . BISMUTH TRIBROMOPH-PETROLATUM EX Apply to right foot topically every Tuesday, Thursday and Saturday    . brimonidine (ALPHAGAN P) 0.1 % SOLN Place 1 drop into both eyes daily.    . collagenase (SANTYL) ointment Apply to right heel topically on Tuesday, Thursday and Saturday    . furosemide (LASIX) 20 MG tablet Take 1 tablet (20 mg total) by mouth daily. 30 tablet 0  . loperamide (IMODIUM) 2 MG capsule Take 1 capsule (2 mg total) by mouth as needed for diarrhea or loose stools. 30 capsule 0  . methocarbamol (ROBAXIN) 500 MG tablet Take 0.5 tablets (250 mg total) by mouth every 8 (eight) hours as needed for muscle spasms. 15 tablet 0  . metoCLOPramide (REGLAN)  5 MG tablet Take 1 tablet (5 mg total) by mouth every 6 (six) hours as needed for nausea. 20 tablet 0  . Multiple Vitamin (MULTIVITAMIN WITH MINERALS) TABS tablet Take 1 tablet by mouth daily.    . nadolol (CORGARD) 20 MG tablet Take 1 tablet (20 mg total) by mouth daily. 30 tablet 0  . nystatin (MYCOSTATIN/NYSTOP) powder Apply topically 2 (two) times daily. 15 g 0  . oxyCODONE (ROXICODONE) 5 MG immediate release tablet Take one tablet by mouth every 4 hours as needed for pain 180 tablet 0  . pantoprazole (PROTONIX) 40 MG tablet Take 1 tablet (40 mg total) by mouth 2 (two) times daily. 60 tablet 0  . saccharomyces boulardii (FLORASTOR) 250 MG capsule Take 1 capsule (250 mg total) by mouth 2 (two) times daily. 10 capsule 0  . spironolactone (ALDACTONE) 50 MG tablet Take 50 mg by mouth daily.    . sucralfate (CARAFATE) 1 GM/10ML suspension Take 10 mLs (1 g total) by mouth 4 (four) times daily -  with meals and at bedtime. 420 mL 0  . UNABLE TO FIND Med Pass 237 ml by mouth 3 times daily    . vitamin C (ASCORBIC ACID) 500 MG tablet Take 500 mg by mouth 2 (two) times daily.    . Zinc Sulfate (ZINC-220 PO) Take 1 tablet by mouth daily.     No current facility-administered medications for this visit.      REVIEW OF SYSTEMS:  All other systems were reviewed with the patient and are negative.  PHYSICAL EXAMINATION: ECOG PERFORMANCE STATUS:  2 - Symptomatic, <50% confined to bed  Vitals:   12/04/16 1049  BP: 129/67  Pulse: 78  Resp: 18  Temp: 97.9 F (36.6 C)   Filed Weights    GENERAL:alert, no distress and comfortable SKIN: skin color, texture, turgor are normal, no rashes or significant lesions ABDOMEN:abdomen appears distended with swelling Musculoskeletal:no cyanosis of digits and no clubbing  NEURO: alert & oriented x 3 with fluent speech, no focal motor/sensory deficits  LABORATORY DATA:  I have reviewed the data as listed     Component Value Date/Time   NA 133 (L) 11/08/2016  0446   NA 138 04/21/2016 1458   K 3.4 (L) 11/08/2016 0446   K 3.6 04/21/2016 1458   CL 107 11/08/2016 0446   CO2 19 (L) 11/08/2016 0446   CO2 18 (L) 04/21/2016 1458   GLUCOSE 135 (H) 11/08/2016 0446   GLUCOSE 128 04/21/2016 1458   BUN 32 (H) 11/08/2016 0446   BUN 15.7 04/21/2016 1458   CREATININE 1.63 (H) 11/08/2016 0446   CREATININE 1.3 04/21/2016 1458   CALCIUM 8.2 (L) 11/08/2016 0446   CALCIUM 8.8 04/21/2016 1458   PROT 5.6 (L) 11/07/2016 0550   PROT 6.9 04/21/2016 1458   ALBUMIN 2.0 (L) 11/08/2016 0446   ALBUMIN 2.6 (L) 04/21/2016 1458   AST 39 11/07/2016 0550   AST 38 (H) 04/21/2016 1458   ALT 18 11/07/2016 0550   ALT 32 04/21/2016 1458   ALKPHOS 45 11/07/2016 0550   ALKPHOS 122 04/21/2016 1458   BILITOT 2.7 (H) 11/07/2016 0550   BILITOT 1.77 (H) 04/21/2016 1458   GFRNONAA 42 (L) 11/08/2016 0446   GFRAA 49 (L) 11/08/2016 0446    No results found for: SPEP, UPEP  Lab Results  Component Value Date   WBC 9.4 12/04/2016   NEUTROABS 7.4 (H) 12/04/2016   HGB 10.2 (L) 12/04/2016   HCT 30.5 (L) 12/04/2016   MCV 100.7 (H) 12/04/2016   PLT 115 (L) 12/04/2016      Chemistry      Component Value Date/Time   NA 133 (L) 11/08/2016 0446   NA 138 04/21/2016 1458   K 3.4 (L) 11/08/2016 0446   K 3.6 04/21/2016 1458   CL 107 11/08/2016 0446   CO2 19 (L) 11/08/2016 0446   CO2 18 (L) 04/21/2016 1458   BUN 32 (H) 11/08/2016 0446   BUN 15.7 04/21/2016 1458   CREATININE 1.63 (H) 11/08/2016 0446   CREATININE 1.3 04/21/2016 1458   GLU 136 11/17/2015 0457      Component Value Date/Time   CALCIUM 8.2 (L) 11/08/2016 0446   CALCIUM 8.8 04/21/2016 1458   ALKPHOS 45 11/07/2016 0550   ALKPHOS 122 04/21/2016 1458   AST 39 11/07/2016 0550   AST 38 (H) 04/21/2016 1458   ALT 18 11/07/2016 0550   ALT 32 04/21/2016 1458   BILITOT 2.7 (H) 11/07/2016 0550   BILITOT 1.77 (H) 04/21/2016 1458      ASSESSMENT & PLAN:  Pancytopenia, acquired (HCC) The cause of anemia is  multifactorial, likely due to anemia chronic disease and chronic renal failure He does not need darbepoetin injection. I will see him on a yearly basis  Thrombocytopenia (HCC) The thrombocytopenia is likely related to liver disease and splenomegaly. He is not symptomatic. There is no contraindication to remain on aspirin as long as the platelet is greater than 50,000.  Cirrhosis of liver with ascites (HCC) He has dependent edema and signs of abdominal ascites. He probably have evidence of  third spacing from low protein status. I recommend he call GI service for direction to see whether he needs to take increased dose diuretic therapy or have repeat paracentesis.   No orders of the defined types were placed in this encounter.   All questions were answered. The patient knows to call the clinic with any problems, questions or concerns. No barriers to learning was detected.  I spent 15 minutes counseling the patient face to face. The total time spent in the appointment was 20 minutes and more than 50% was on counseling.     Artis Delay, Jeffery [name] 3/19/201811:23 AM

## 2016-12-04 NOTE — Telephone Encounter (Signed)
LM with note below 

## 2016-12-04 NOTE — Telephone Encounter (Signed)
-----   Message from Artis DelayNi Gorsuch, MD sent at 12/04/2016  1:39 PM EDT ----- Regarding: LFT stable Pls let him know LFT is OK ----- Message ----- From: Interface, Lab In Three Zero One Sent: 12/04/2016  10:43 AM To: Artis DelayNi Gorsuch, MD

## 2016-12-07 ENCOUNTER — Non-Acute Institutional Stay (SKILLED_NURSING_FACILITY): Payer: BC Managed Care – PPO | Admitting: Adult Health

## 2016-12-07 ENCOUNTER — Encounter: Payer: Self-pay | Admitting: Adult Health

## 2016-12-07 DIAGNOSIS — K7460 Unspecified cirrhosis of liver: Secondary | ICD-10-CM | POA: Diagnosis not present

## 2016-12-07 DIAGNOSIS — K7210 Chronic hepatic failure without coma: Secondary | ICD-10-CM

## 2016-12-07 DIAGNOSIS — K2981 Duodenitis with bleeding: Secondary | ICD-10-CM | POA: Diagnosis not present

## 2016-12-07 DIAGNOSIS — D696 Thrombocytopenia, unspecified: Secondary | ICD-10-CM

## 2016-12-07 DIAGNOSIS — E440 Moderate protein-calorie malnutrition: Secondary | ICD-10-CM | POA: Diagnosis not present

## 2016-12-07 DIAGNOSIS — K269 Duodenal ulcer, unspecified as acute or chronic, without hemorrhage or perforation: Secondary | ICD-10-CM

## 2016-12-07 DIAGNOSIS — Z794 Long term (current) use of insulin: Secondary | ICD-10-CM

## 2016-12-07 DIAGNOSIS — E118 Type 2 diabetes mellitus with unspecified complications: Secondary | ICD-10-CM

## 2016-12-07 DIAGNOSIS — R188 Other ascites: Secondary | ICD-10-CM

## 2016-12-07 DIAGNOSIS — M86172 Other acute osteomyelitis, left ankle and foot: Secondary | ICD-10-CM

## 2016-12-07 NOTE — Progress Notes (Signed)
Location:   [facility] Room Number: 109 A Place of Service:  SNF (31)   CODE STATUS: DNR  No Known Allergies  Chief Complaint  Patient presents with  . Medical Management of Chronic Issues    1 month follow up    HPI:  He is a short term rehab patient of this facility being seen for the management of his chronic illnesses. He is complaining of a cough which started prior to his hospitalization. He denies any shortness of breath and denies any sputum. He states he is cold today.   Past Medical History:  Diagnosis Date  . Arthritis   . Asthma   . Bilateral cataracts   . Blurred vision    improved now  . Cholelithiasis   . Diabetes mellitus    type II  . Eczema    inner wrist  . Hypertension   . Muscle cramps   . Necrotizing fasciitis (HCC)   . Neuropathy (HCC)   . Rash    wrist and hands since late june  . Retinal degeneration     Past Surgical History:  Procedure Laterality Date  . ABOVE KNEE LEG AMPUTATION     right  . APPLICATION OF WOUND VAC Left 11/06/2016   Procedure: APPLICATION OF WOUND VAC;  Surgeon: Eldred Manges, MD;  Location: [facility] OR;  Service: Orthopedics;  Laterality: Left;  . COLONOSCOPY WITH PROPOFOL N/A 05/18/2016   Procedure: COLONOSCOPY WITH PROPOFOL;  Surgeon: Rachael Fee, MD;  Location: [facility];  Service: Endoscopy;  Laterality: N/A;  . ESOPHAGOGASTRODUODENOSCOPY (EGD) WITH PROPOFOL N/A 05/18/2016   Procedure: ESOPHAGOGASTRODUODENOSCOPY (EGD) WITH PROPOFOL;  Surgeon: Rachael Fee, MD;  Location: [facility];  Service: Endoscopy;  Laterality: N/A;  . ESOPHAGOGASTRODUODENOSCOPY (EGD) WITH PROPOFOL N/A 11/01/2016   Procedure: ESOPHAGOGASTRODUODENOSCOPY (EGD) WITH PROPOFOL;  Surgeon: Napoleon Form, MD;  Location: [facility];  Service: Endoscopy;  Laterality: N/A;  . EYE SURGERY  09/2011   bilateral for retinopathy - laser eye surgery  . I&D EXTREMITY Left 10/25/2016   Procedure: LEFT FOOT DEBRIDEMENT RAY AMPUTATION  PLACEMENT OF WOUND VAC;  Surgeon: Eldred Manges, MD;  Location: [facility] OR;  Service: Orthopedics;  Laterality: Left;  . I&D EXTREMITY Left 11/06/2016   Procedure: IRRIGATION AND DEBRIDEMENT EXTREMITY/LEFT FOOT;  Surgeon: Eldred Manges, MD;  Location: [facility] OR;  Service: Orthopedics;  Laterality: Left;  . left metatarsal  2003   5th  . TOE AMPUTATION     Left big toe    Social History   Social History  . Marital status: Married    Spouse name: N/A  . Number of children: N/A  . Years of education: N/A   Occupational History  . Not on file.   Social History Main Topics  . Smoking status: Never Smoker  . Smokeless tobacco: Never Used  . Alcohol use No  . Drug use: No  . Sexual activity: Not on file     Comment: computer work. next of kin. Sister Marisue Humble next of kin   Other Topics Concern  . Not on file   Social History Narrative  . No narrative on file   Family History  Problem Relation Age of Onset  . Cancer Mother     unknown type      VITAL SIGNS BP 118/64   Pulse 72   Temp 97.9 F (36.6 C)   Resp 16   Ht 6\' 1"  (1.854 m)   SpO2 95%   Patient's  Medications  New Prescriptions   No medications on file  Previous Medications   AMINO ACIDS-PROTEIN HYDROLYS (FEEDING SUPPLEMENT, PRO-STAT SUGAR FREE 64,) LIQD    Take 30 mLs by mouth 3 (three) times daily.   ATORVASTATIN (LIPITOR) 40 MG TABLET    Take 40 mg by mouth daily.   BISMUTH TRIBROMOPH-PETROLATUM EX    Apply to right foot topically every Tuesday, Thursday and Saturday   BRIMONIDINE (ALPHAGAN P) 0.1 % SOLN    Place 1 drop into both eyes daily.   COLLAGENASE (SANTYL) OINTMENT    Apply to right heel topically on Tuesday, Thursday and Saturday   FUROSEMIDE (LASIX) 40 MG TABLET    Take 40 mg by mouth daily.   INSULIN ASPART (NOVOLOG FLEXPEN) 100 UNIT/ML FLEXPEN    Inject as per sliding scale: if 200 - 250 = 2 units; 251 - 300 = 4 units; 301 - 350 = 6 units; 351 - 400 = 8 units, if greater than 400 notify MD Subcutaneously  before meals and at bedtime.   LOPERAMIDE (IMODIUM) 2 MG CAPSULE    Take 1 capsule (2 mg total) by mouth as needed for diarrhea or loose stools.   METHOCARBAMOL (ROBAXIN) 500 MG TABLET    Take 0.5 tablets (250 mg total) by mouth every 8 (eight) hours as needed for muscle spasms.   METOCLOPRAMIDE (REGLAN) 5 MG TABLET    Take 1 tablet (5 mg total) by mouth every 6 (six) hours as needed for nausea.   MULTIPLE VITAMIN (MULTIVITAMIN WITH MINERALS) TABS TABLET    Take 1 tablet by mouth daily.   NADOLOL (CORGARD) 20 MG TABLET    Take 1 tablet (20 mg total) by mouth daily.   NYSTATIN (MYCOSTATIN/NYSTOP) POWDER    Apply topically 2 (two) times daily.   OXYCODONE (ROXICODONE) 5 MG IMMEDIATE RELEASE TABLET    Take one tablet by mouth every 4 hours as needed for pain   PANTOPRAZOLE (PROTONIX) 40 MG TABLET    Take 1 tablet (40 mg total) by mouth 2 (two) times daily.   SACCHAROMYCES BOULARDII (FLORASTOR) 250 MG CAPSULE    Take 1 capsule (250 mg total) by mouth 2 (two) times daily.   SPIRONOLACTONE (ALDACTONE) 50 MG TABLET    Take 50 mg by mouth daily.   SUCRALFATE (CARAFATE) 1 GM/10ML SUSPENSION    Take 10 mLs (1 g total) by mouth 4 (four) times daily -  with meals and at bedtime.   UNABLE TO FIND    Med Pass 237 ml by mouth 3 times daily   VITAMIN C (ASCORBIC ACID) 500 MG TABLET    Take 500 mg by mouth 2 (two) times daily.   WOUND DRESSINGS GEL    Apply topically as needed for wound care. TheraHoney Gel - Apply to left heel and left plantar foot topically every day   ZINC SULFATE (ZINC-220 PO)    Take 1 tablet by mouth daily.  Modified Medications   No medications on file  Discontinued Medications   FUROSEMIDE (LASIX) 20 MG TABLET    Take 1 tablet (20 mg total) by mouth daily.     SIGNIFICANT DIAGNOSTIC EXAMS  10-24-16: mri left foot: Previous amputation of the right first toe. Soft tissue edema consistent with cellulitis and ulceration at the stomach and over the first metatarsal head. Edema in the  plantar musculature. Focal bone erosion and marrow signal changes at the distal first metatarsal head and involving the proximal phalangeal stump likely indicates focal osteomyelitis.  10-31-16: renal ultrasound: Ascites.  Simple left renal cysts.  No other abnormality seen.   11-02-16: abdominal ultrasound: Ascites. Solitary gallstone without gallbladder wall thickening or pericholecystic fluid. Findings consistent with hepatic cirrhosis.  11-23-16: paracentesis: Successful ultrasound-guided diagnostic and therapeutic paracentesis yielding 4 liters of peritoneal fluid. The patient will receive IV albumin infusion postprocedure.      LABS REVIEWED:   10-31-16: pre-albumin <5 11-10-16; wbc 3.9; hgb 8.6; hct 25.5 ;mcv 102.5; plt 43; glucose 156; bun 32.1; creat 1.31; k+ 4.1; na++ 137 total bili 2.7; ast 36; albumin 2.7 11-15-16: wbc 4.1; hgb 9.6; hct 29.6; mcv 104.4; plt 55; glucose 121; bun 34.1; creat 1.15; k+ 3.8; na++ 136; total bili 3.0; ast 44; albumin 2.2  11-16-16: glucose 136; bun 32.0; creat 1.17; k+ 4.2; na++ 134  12-04-16: wbc 9.4; hgb 10.2; hct 30.5; mcv 100.7; plt 115; ferritin 986   Review of Systems  Constitutional: Negative for malaise/fatigue.  Respiratory: Positive for cough. Negative for shortness of breath.   Cardiovascular: Negative for chest pain, palpitations and leg swelling.  Gastrointestinal: Negative for abdominal pain, constipation and heartburn.  Musculoskeletal: Negative for back pain, joint pain and myalgias.  Skin: Negative.   Neurological: Negative for dizziness.  Endo/Heme/Allergies:       Is cold   Psychiatric/Behavioral: The patient is not nervous/anxious.    Physical Exam  Constitutional: He is oriented to person, place, and time. No distress.  Eyes: Conjunctivae are normal.  Neck: Neck supple. No JVD present. No thyromegaly present.  Cardiovascular: Normal rate, regular rhythm and intact distal pulses.   Respiratory: Effort normal. No respiratory  distress. He has no wheezes. He has rales.  Breath sounds diminished   GI: Soft. Bowel sounds are normal. He exhibits no distension. There is no tenderness.  Has ascites   Musculoskeletal: He exhibits no edema.  Able to move all extremities   Lymphadenopathy:    He has no cervical adenopathy.  Neurological: He is alert and oriented to person, place, and time.  Skin: Skin is warm and dry. He is not diaphoretic. There is pallor.  Left heel: 1.7 x 2.9 cm Left lateral foot: 5.0 x 3.2 x 1.8 cm suture intact   Psychiatric: He has a normal mood and affect.     ASSESSMENT/ PLAN:   1. Ulcer of esophagus without bleed: has multiple duodenal ulcers: will continue protonix 40 mg twice daily and will continue carafate 1 gm four times daily   2. Liver cirrhosis with ascites: ESLD has chronic liver failure:  Is status post paracentesis on 11-23-16: with 4 liter return:   will continue aldactone 50 mg daily   3. Severe protein calorie malnutrition: pre-albumin <5 albumin 2.2: will continue supplements per facility protocol   4. Dyslipidemia: will continue lipitor 40 mg daily  5. Hypertension: will continue nadolol 20 mg daily aldactone 50 mg daily  6. Glaucoma: will continue alphagan to both eyes daily   7. Diabetes: will continue novolog SSI: 200-250: 2 units; 251-300: 4 units; 301-350: 6 units; 351-400: 8 units  8. Thrombocytopenia: plt 115  9. 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 Cough: will get a chest x-ray: will begin on duoneb every 6 hours routinely for one week: will begin zithromax 500 mg daily for 7 days; with florastor     MD is aware of resident's narcotic use and is in agreement  with current plan of care. We will attempt to wean resident as apropriate   Synthia Innocent NP [facility]  Contact [phone] Monday through Friday 8am- 5pm    After hours call [phone]

## 2016-12-11 ENCOUNTER — Telehealth: Payer: Self-pay | Admitting: Gastroenterology

## 2016-12-11 NOTE — Telephone Encounter (Signed)
Patient reports that he is having edema in her extremity.  He notes that he is a amputee and that his thighs are very edematous and that the right is twice the size of the left, and he has edema of the entire right leg.  He is taking furosemide 40 mg and spironolactone 100 mg daily.  He has follow up in May with Dr. Christella HartiganJacobs and asks what can be done about the edema until his appt?

## 2016-12-11 NOTE — Telephone Encounter (Signed)
Attempted to return the call "the wireless customer you are attempting to reach is not taking calls"

## 2016-12-12 NOTE — Telephone Encounter (Signed)
Let's have him get another BMP this week and if his renal function looks ok then maybe we could consider increasing his diuretics.

## 2016-12-12 NOTE — Telephone Encounter (Signed)
Left message on machine to call back  

## 2016-12-13 ENCOUNTER — Encounter: Payer: Self-pay | Admitting: Internal Medicine

## 2016-12-13 ENCOUNTER — Telehealth (INDEPENDENT_AMBULATORY_CARE_PROVIDER_SITE_OTHER): Payer: Self-pay | Admitting: Radiology

## 2016-12-13 ENCOUNTER — Non-Acute Institutional Stay (SKILLED_NURSING_FACILITY): Payer: BC Managed Care – PPO | Admitting: Internal Medicine

## 2016-12-13 DIAGNOSIS — L97504 Non-pressure chronic ulcer of other part of unspecified foot with necrosis of bone: Secondary | ICD-10-CM

## 2016-12-13 DIAGNOSIS — R4589 Other symptoms and signs involving emotional state: Secondary | ICD-10-CM | POA: Insufficient documentation

## 2016-12-13 DIAGNOSIS — E11621 Type 2 diabetes mellitus with foot ulcer: Secondary | ICD-10-CM | POA: Diagnosis not present

## 2016-12-13 DIAGNOSIS — L97524 Non-pressure chronic ulcer of other part of left foot with necrosis of bone: Secondary | ICD-10-CM | POA: Diagnosis not present

## 2016-12-13 NOTE — Progress Notes (Signed)
This is a nursing facility follow up for specific acute issue of wound complications.  Interim medical record and care since last [facility] visit was updated with review of diagnostic studies and change in clinical status since last visit were documented.  HPI: Wound care nurse asked me to assess his wounds of the left foot. One of 3 wounds has been draining serous material .He was seen by Dr. Fredric MareBailey yesterday and the wound of the plantar surface of the left foot cultured. Bactrim DS was initiated. He was referred to Dr Radene JourneyMark Yates,Orthopedist 3/29 for further assessment and possible debridement. The wounds are related to diabetes and peripheral vascular disease.  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 He is followed by Dr. Evlyn [place], his most recent A1c was 6.9% according to the patient. Also the wound care nurse is concerned about depression. The patient's been eating poorly. Palliative care has evaluated the patient,but that specialist states that the patient was poorly communicative about his hopes, fears, and trade-offs and maintain that he was not depressed. 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 He alludes to some depression in his family. There is no family history of suicide.  Review of systems: He denies depression and categorically refuses antidepressants. He declares that he has no spiritual or religious base.  He has intermittent nonproductive cough but states his asthma is controlled. He's had chronic neuropathy in the left lower extremity, but he denies active symptoms of pain with this. Constitutional: No fever,significant weight change, fatigue  Eyes: No redness, discharge, pain, vision change Cardiovascular: No chest pain, palpitations,paroxysmal nocturnal dyspnea, claudication, edema  Respiratory: No sputum  production,hemoptysis, DOE , significant snoring,apnea   Genitourinary: No dysuria,hematuria, pyuria,  incontinence, nocturia Musculoskeletal: No joint stiffness, joint swelling, weakness,pain Dermatologic: No rash, pruritus Endocrine: No change in hair/skin/ nails, excessive thirst, excessive hunger, excessive urination  Hematologic/lymphatic: No significant bruising, lymphadenopathy,abnormal bleeding Allergy/immunology: No itchy/ watery eyes, significant sneezing, urticaria, angioedema  Physical exam:  Pertinent or positive findings: Affect is flat . Pattern alopecia is present. He has a full beard and mustache. He has mild rhonchi at the bases without associated wheezing or rales. He does cough intermittently ,it is nonproductive. Abdomen is protuberant. AKA present on the right. The 3 wounds on the left foot were reviewed with the wound care nurse. There is diffuse erythema and edema over the distal dorsum of L  Foot suggesting cellulitis. The left great toe is absent.   General appearance:Adequately nourished; no acute distress , increased work of breathing is present.   Lymphatic: No lymphadenopathy about the head, neck, axilla . Eyes: No conjunctival inflammation or lid edema is present. There is no scleral icterus. Ears:  External ear exam shows no significant lesions or deformities.   Nose:  External nasal examination shows no deformity or inflammation. Nasal mucosa are pink and moist without lesions ,exudates Oral exam: lips and gums are healthy appearing.There is no oropharyngeal erythema or exudate . Neck:  No thyromegaly, masses, tenderness noted.    Heart:  Normal rate and regular rhythm. S1 and S2 normal without gallop, murmur, click, rub .  Abdomen:Bowel sounds are normal. Abdomen is soft and nontender with no organomegaly, hernias,masses. GU: deferred  Extremities:  No cyanosis, clubbing  Skin: Warm & dry w/o tenting. No significant rash.  #1 diabetic wounds left foot with  clinical cellulitis  and concern for osteomyelitis #2 clinical depression, denied The pathophysiology of neurotransmitter deficiency was discussed along with the benefits and potential adverse effects of SSRI therapy.

## 2016-12-13 NOTE — Telephone Encounter (Signed)
Debbe [facility]Latisha from [facility] calling about patients, states plantar foot wound that was operated on by Dr. Ophelia CharterYates in February is worsening. Their wound care doctor would like patient to follow up with Dr. Ophelia CharterYates as soon as possible. Advised of schedule. [facility], since Dr. Ophelia CharterYates will be in surgery this afternoon. Appt made at 945. Their office location and contact information was given over the phone.

## 2016-12-13 NOTE — Assessment & Plan Note (Signed)
Admit in next 24 hours should he develop fever, nausea vomiting or exhibit other signs of sepsis Otherwise keep appointment with Dr. Ophelia CharterYates tomorrow

## 2016-12-13 NOTE — Assessment & Plan Note (Signed)
The pathophysiology of neurotransmitter deficiency was discussed along with the benefits and potential adverse effects of SSRI therapy.    An incredibly sensitive and compassionate discussion of health issues is found in the book Being Mortal by Dr Vivi FernsAtul Gawande. I recommended  this to this patient in this difficult time

## 2016-12-14 ENCOUNTER — Ambulatory Visit (INDEPENDENT_AMBULATORY_CARE_PROVIDER_SITE_OTHER): Payer: BC Managed Care – PPO | Admitting: Orthopaedic Surgery

## 2016-12-14 ENCOUNTER — Ambulatory Visit (INDEPENDENT_AMBULATORY_CARE_PROVIDER_SITE_OTHER): Payer: Self-pay

## 2016-12-14 ENCOUNTER — Encounter (INDEPENDENT_AMBULATORY_CARE_PROVIDER_SITE_OTHER): Payer: Self-pay | Admitting: Orthopaedic Surgery

## 2016-12-14 VITALS — BP 110/69 | Ht 73.0 in | Wt 230.0 lb

## 2016-12-14 DIAGNOSIS — M79672 Pain in left foot: Secondary | ICD-10-CM

## 2016-12-14 NOTE — Progress Notes (Signed)
Post-Op Visit Note   Patient: Jeffery [name]           Date of Birth: 08-Apr-1951           MRN: [id number] Visit Date: 12/14/2016 PCP: Julian Hy, MD   Assessment & Plan:  Chief Complaint:  Chief Complaint  Patient presents with  . Left Foot - Wound Check   Visit Diagnoses:  1. Pain in left foot   Patient is status post the foot debridement and ultimately a first ray amputation left foot for infected diabetic ulcer grade 4 Wagner. He had a VAC placed but have problems with skin maceration and then was switched to wet-to-dry dressing changes. He has a packing placed over the plantar surface with a 2 cm area. Proximal area shows granulation tissue do not see any obvious bony set a recent culture that grew some staph and he currently is on Bactrim DS View x-rays obtained today shows no evidence of osteomyelitis. Will proceed with MRI scan. Plan: We'll obtain an MRI with and without contrast to evaluating for potential osteomyelitis. I reviewed the plain radiograph results with the patient. His increased drainage foul smell makes this a concern he may have some bone infection although it's not seen on his plain x-rays.  Follow-Up Instructions: No Follow-up on file.   Orders:  Orders Placed This Encounter  Procedures  . XR Foot Complete Left   No orders of the defined types were placed in this encounter.  HPI Patient presents with left foot wound that has increased drainage. He is status post I&D left foot with vac application on 11/06/2016.  He states the wound has been draining x 2 weeks.  The office received a call from [facility] yesterday stating the wound doctor would like for him to follow up due to the wound looking worse.  He has ansept, wet to moist dressing, and bactrim.  Imaging: No results found.  PMFS History: Patient Active Problem List   Diagnosis Date Noted  . 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 Depressed affect 12/13/2016  . Leg edema 11/24/2016  . Cirrhosis of liver (HCC)   . Multiple duodenal ulcers   . Ulcer of esophagus without bleeding   . Melena   . Chronic liver failure without hepatic coma (HCC)   . Advance care planning   . Goals of care, counseling/discussion   . Palliative care by specialist   . End stage liver disease (HCC)   . Moderate single current episode of major depressive disorder (HCC)   . Anxiety state   . Diaper candidiasis   . Somnolence   . Moderate malnutrition (HCC)   . Noninfectious gastroenteritis   . Gastrointestinal hemorrhage   . Controlled diabetes mellitus type 2 with complications (HCC)   . Osteomyelitis of left foot (HCC)   . Acute renal failure (HCC)   . Abdominal pain   . Diarrhea   . Osteomyelitis (HCC)   . C. difficile colitis 10/23/2016  . AKI (acute kidney injury) (HCC)   . Weakness   . Gastrointestinal hemorrhage with melena   . Idiopathic hypotension   . Other hyperlipidemia   . Esophageal varices without bleeding (HCC)   . Portal hypertensive gastropathy   . Cirrhosis of liver with ascites (HCC) 05/12/2016  . Special screening for malignant neoplasms, colon 05/12/2016  . Coagulopathy (HCC) 04/28/2016  . Hemolytic anemia (HCC) 03/24/2016  . G6PD deficiency anemia (HCC)  03/24/2016  . Thrombocytopenia (HCC) 03/24/2016  . Generalized psoriasis 03/20/2016  . Gilbert syndrome 03/20/2016  . Pancytopenia, acquired (HCC) 03/20/2016  . Hematemesis without nausea 03/20/2016   Past Medical History:  Diagnosis Date  . Arthritis   . Asthma   . Bilateral cataracts   . Blurred vision    improved now  . Cholelithiasis   . Diabetes mellitus    type II  . Eczema    inner wrist  . Hypertension   . Muscle cramps   . Necrotizing fasciitis (HCC)   . Neuropathy (HCC)   . Rash    wrist and hands since late june  . Retinal degeneration     Family History  Problem Relation Age of Onset  . Cancer Mother      unknown type    Past Surgical History:  Procedure Laterality Date  . ABOVE KNEE LEG AMPUTATION     right  . APPLICATION OF WOUND VAC Left 11/06/2016   Procedure: APPLICATION OF WOUND VAC;  Surgeon: Eldred MangesMark C Yates, MD;  Location: [facility];  Service: Orthopedics;  Laterality: Left;  . COLONOSCOPY WITH PROPOFOL N/A 05/18/2016   Procedure: COLONOSCOPY WITH PROPOFOL;  Surgeon: Rachael Feeaniel P Jacobs, MD;  Location: [facility];  Service: Endoscopy;  Laterality: N/A;  . ESOPHAGOGASTRODUODENOSCOPY (EGD) WITH PROPOFOL N/A 05/18/2016   Procedure: ESOPHAGOGASTRODUODENOSCOPY (EGD) WITH PROPOFOL;  Surgeon: Rachael Feeaniel P Jacobs, MD;  Location: [facility];  Service: Endoscopy;  Laterality: N/A;  . ESOPHAGOGASTRODUODENOSCOPY (EGD) WITH PROPOFOL N/A 11/01/2016   Procedure: ESOPHAGOGASTRODUODENOSCOPY (EGD) WITH PROPOFOL;  Surgeon: Napoleon FormKavitha Nandigam V, MD;  Location: [facility];  Service: Endoscopy;  Laterality: N/A;  . EYE SURGERY  09/2011   bilateral for retinopathy - laser eye surgery  . I&D EXTREMITY Left 10/25/2016   Procedure: LEFT FOOT DEBRIDEMENT RAY AMPUTATION PLACEMENT OF WOUND VAC;  Surgeon: Eldred MangesMark C Yates, MD;  Location: [facility];  Service: Orthopedics;  Laterality: Left;  . I&D EXTREMITY Left 11/06/2016   Procedure: IRRIGATION AND DEBRIDEMENT EXTREMITY/LEFT FOOT;  Surgeon: Eldred MangesMark C Yates, MD;  Location: [facility];  Service: Orthopedics;  Laterality: Left;  . left metatarsal  2003   5th  . TOE AMPUTATION     Left big toe   Social History   Occupational History  . Not on file.   Social History Main Topics  . Smoking status: Never Smoker  . Smokeless tobacco: Never Used  . Alcohol use No  . Drug use: No  . Sexual activity: Not on file     Comment: computer work. next of kin. Sister Marisue HumbleMAureen next of kin

## 2016-12-14 NOTE — Addendum Note (Signed)
Addended by: Rogers SeedsYEATTS, Janeisha Ryle M on: 12/14/2016 11:49 AM   Modules accepted: Orders

## 2016-12-14 NOTE — Telephone Encounter (Signed)
Left message on machine to call back  

## 2016-12-18 ENCOUNTER — Inpatient Hospital Stay (HOSPITAL_COMMUNITY)
Admission: EM | Admit: 2016-12-18 | Discharge: 2016-12-27 | DRG: 982 | Disposition: A | Discharge status: Rehabilitation Facility | Payer: BC Managed Care – PPO | Attending: Family Medicine | Admitting: Family Medicine

## 2016-12-18 ENCOUNTER — Encounter (HOSPITAL_COMMUNITY): Payer: Self-pay | Admitting: Family Medicine

## 2016-12-18 ENCOUNTER — Emergency Department (HOSPITAL_COMMUNITY): Payer: BC Managed Care – PPO

## 2016-12-18 DIAGNOSIS — R188 Other ascites: Secondary | ICD-10-CM

## 2016-12-18 DIAGNOSIS — K7460 Unspecified cirrhosis of liver: Secondary | ICD-10-CM | POA: Diagnosis present

## 2016-12-18 DIAGNOSIS — I129 Hypertensive chronic kidney disease with stage 1 through stage 4 chronic kidney disease, or unspecified chronic kidney disease: Secondary | ICD-10-CM | POA: Diagnosis present

## 2016-12-18 DIAGNOSIS — N183 Chronic kidney disease, stage 3 unspecified: Secondary | ICD-10-CM | POA: Diagnosis present

## 2016-12-18 DIAGNOSIS — I8510 Secondary esophageal varices without bleeding: Secondary | ICD-10-CM | POA: Diagnosis not present

## 2016-12-18 DIAGNOSIS — N179 Acute kidney failure, unspecified: Secondary | ICD-10-CM | POA: Diagnosis not present

## 2016-12-18 DIAGNOSIS — L97504 Non-pressure chronic ulcer of other part of unspecified foot with necrosis of bone: Secondary | ICD-10-CM

## 2016-12-18 DIAGNOSIS — I4510 Unspecified right bundle-branch block: Secondary | ICD-10-CM | POA: Diagnosis present

## 2016-12-18 DIAGNOSIS — L02612 Cutaneous abscess of left foot: Secondary | ICD-10-CM | POA: Diagnosis not present

## 2016-12-18 DIAGNOSIS — E11319 Type 2 diabetes mellitus with unspecified diabetic retinopathy without macular edema: Secondary | ICD-10-CM | POA: Diagnosis present

## 2016-12-18 DIAGNOSIS — E871 Hypo-osmolality and hyponatremia: Secondary | ICD-10-CM | POA: Diagnosis not present

## 2016-12-18 DIAGNOSIS — Z89611 Acquired absence of right leg above knee: Secondary | ICD-10-CM | POA: Diagnosis not present

## 2016-12-18 DIAGNOSIS — R64 Cachexia: Secondary | ICD-10-CM | POA: Diagnosis not present

## 2016-12-18 DIAGNOSIS — R4589 Other symptoms and signs involving emotional state: Secondary | ICD-10-CM | POA: Diagnosis not present

## 2016-12-18 DIAGNOSIS — K7290 Hepatic failure, unspecified without coma: Secondary | ICD-10-CM | POA: Diagnosis not present

## 2016-12-18 DIAGNOSIS — E1142 Type 2 diabetes mellitus with diabetic polyneuropathy: Secondary | ICD-10-CM | POA: Diagnosis not present

## 2016-12-18 DIAGNOSIS — M79604 Pain in right leg: Secondary | ICD-10-CM | POA: Diagnosis not present

## 2016-12-18 DIAGNOSIS — M869 Osteomyelitis, unspecified: Secondary | ICD-10-CM | POA: Diagnosis present

## 2016-12-18 DIAGNOSIS — R1013 Epigastric pain: Secondary | ICD-10-CM | POA: Diagnosis not present

## 2016-12-18 DIAGNOSIS — I8500 Esophageal varices without bleeding: Secondary | ICD-10-CM | POA: Diagnosis present

## 2016-12-18 DIAGNOSIS — M009 Pyogenic arthritis, unspecified: Secondary | ICD-10-CM

## 2016-12-18 DIAGNOSIS — E875 Hyperkalemia: Secondary | ICD-10-CM | POA: Diagnosis not present

## 2016-12-18 DIAGNOSIS — K766 Portal hypertension: Secondary | ICD-10-CM | POA: Diagnosis not present

## 2016-12-18 DIAGNOSIS — M19072 Primary osteoarthritis, left ankle and foot: Secondary | ICD-10-CM | POA: Diagnosis present

## 2016-12-18 DIAGNOSIS — E872 Acidosis: Secondary | ICD-10-CM | POA: Diagnosis present

## 2016-12-18 DIAGNOSIS — R109 Unspecified abdominal pain: Secondary | ICD-10-CM

## 2016-12-18 DIAGNOSIS — K7031 Alcoholic cirrhosis of liver with ascites: Secondary | ICD-10-CM | POA: Diagnosis not present

## 2016-12-18 DIAGNOSIS — E11621 Type 2 diabetes mellitus with foot ulcer: Secondary | ICD-10-CM | POA: Diagnosis not present

## 2016-12-18 DIAGNOSIS — Z794 Long term (current) use of insulin: Secondary | ICD-10-CM

## 2016-12-18 DIAGNOSIS — D696 Thrombocytopenia, unspecified: Secondary | ICD-10-CM | POA: Diagnosis not present

## 2016-12-18 DIAGNOSIS — S88112A Complete traumatic amputation at level between knee and ankle, left lower leg, initial encounter: Secondary | ICD-10-CM | POA: Diagnosis not present

## 2016-12-18 DIAGNOSIS — E1140 Type 2 diabetes mellitus with diabetic neuropathy, unspecified: Secondary | ICD-10-CM | POA: Diagnosis present

## 2016-12-18 DIAGNOSIS — D62 Acute posthemorrhagic anemia: Secondary | ICD-10-CM | POA: Diagnosis not present

## 2016-12-18 DIAGNOSIS — M86672 Other chronic osteomyelitis, left ankle and foot: Secondary | ICD-10-CM

## 2016-12-18 DIAGNOSIS — R531 Weakness: Secondary | ICD-10-CM

## 2016-12-18 DIAGNOSIS — E861 Hypovolemia: Secondary | ICD-10-CM | POA: Diagnosis present

## 2016-12-18 DIAGNOSIS — Z66 Do not resuscitate: Secondary | ICD-10-CM | POA: Diagnosis present

## 2016-12-18 DIAGNOSIS — I959 Hypotension, unspecified: Secondary | ICD-10-CM | POA: Diagnosis not present

## 2016-12-18 DIAGNOSIS — R52 Pain, unspecified: Secondary | ICD-10-CM | POA: Diagnosis not present

## 2016-12-18 DIAGNOSIS — D649 Anemia, unspecified: Secondary | ICD-10-CM | POA: Diagnosis present

## 2016-12-18 DIAGNOSIS — K7210 Chronic hepatic failure without coma: Secondary | ICD-10-CM | POA: Diagnosis present

## 2016-12-18 DIAGNOSIS — N170 Acute kidney failure with tubular necrosis: Secondary | ICD-10-CM | POA: Diagnosis not present

## 2016-12-18 DIAGNOSIS — L97524 Non-pressure chronic ulcer of other part of left foot with necrosis of bone: Secondary | ICD-10-CM | POA: Diagnosis not present

## 2016-12-18 DIAGNOSIS — D6959 Other secondary thrombocytopenia: Secondary | ICD-10-CM | POA: Diagnosis present

## 2016-12-18 DIAGNOSIS — R269 Unspecified abnormalities of gait and mobility: Secondary | ICD-10-CM | POA: Diagnosis not present

## 2016-12-18 DIAGNOSIS — E1122 Type 2 diabetes mellitus with diabetic chronic kidney disease: Secondary | ICD-10-CM | POA: Diagnosis present

## 2016-12-18 DIAGNOSIS — N189 Chronic kidney disease, unspecified: Secondary | ICD-10-CM | POA: Diagnosis not present

## 2016-12-18 DIAGNOSIS — D689 Coagulation defect, unspecified: Secondary | ICD-10-CM | POA: Diagnosis not present

## 2016-12-18 DIAGNOSIS — M00072 Staphylococcal arthritis, left ankle and foot: Secondary | ICD-10-CM | POA: Diagnosis not present

## 2016-12-18 DIAGNOSIS — E1151 Type 2 diabetes mellitus with diabetic peripheral angiopathy without gangrene: Secondary | ICD-10-CM | POA: Diagnosis present

## 2016-12-18 DIAGNOSIS — Z515 Encounter for palliative care: Secondary | ICD-10-CM | POA: Diagnosis not present

## 2016-12-18 DIAGNOSIS — L97509 Non-pressure chronic ulcer of other part of unspecified foot with unspecified severity: Secondary | ICD-10-CM

## 2016-12-18 DIAGNOSIS — E1169 Type 2 diabetes mellitus with other specified complication: Secondary | ICD-10-CM | POA: Diagnosis present

## 2016-12-18 DIAGNOSIS — L89622 Pressure ulcer of left heel, stage 2: Secondary | ICD-10-CM | POA: Diagnosis present

## 2016-12-18 DIAGNOSIS — K7689 Other specified diseases of liver: Secondary | ICD-10-CM | POA: Diagnosis not present

## 2016-12-18 DIAGNOSIS — D500 Iron deficiency anemia secondary to blood loss (chronic): Secondary | ICD-10-CM | POA: Diagnosis not present

## 2016-12-18 DIAGNOSIS — Z79899 Other long term (current) drug therapy: Secondary | ICD-10-CM

## 2016-12-18 DIAGNOSIS — D631 Anemia in chronic kidney disease: Secondary | ICD-10-CM | POA: Diagnosis not present

## 2016-12-18 DIAGNOSIS — K7200 Acute and subacute hepatic failure without coma: Secondary | ICD-10-CM | POA: Diagnosis not present

## 2016-12-18 DIAGNOSIS — R5381 Other malaise: Secondary | ICD-10-CM | POA: Diagnosis not present

## 2016-12-18 LAB — I-STAT CHEM 8, ED
BUN: 61 mg/dL — ABNORMAL HIGH (ref 6–20)
CALCIUM ION: 1.12 mmol/L — AB (ref 1.15–1.40)
CREATININE: 3.3 mg/dL — AB (ref 0.61–1.24)
Chloride: 99 mmol/L — ABNORMAL LOW (ref 101–111)
GLUCOSE: 88 mg/dL (ref 65–99)
HCT: 31 % — ABNORMAL LOW (ref 39.0–52.0)
Hemoglobin: 10.5 g/dL — ABNORMAL LOW (ref 13.0–17.0)
POTASSIUM: 5.5 mmol/L — AB (ref 3.5–5.1)
Sodium: 132 mmol/L — ABNORMAL LOW (ref 135–145)
TCO2: 22 mmol/L (ref 0–100)

## 2016-12-18 LAB — CBC WITH DIFFERENTIAL/PLATELET
BASOS PCT: 0 %
Basophils Absolute: 0 10*3/uL (ref 0.0–0.1)
Eosinophils Absolute: 0.3 10*3/uL (ref 0.0–0.7)
Eosinophils Relative: 5 %
HCT: 29 % — ABNORMAL LOW (ref 39.0–52.0)
Hemoglobin: 10 g/dL — ABNORMAL LOW (ref 13.0–17.0)
LYMPHS PCT: 9 %
Lymphs Abs: 0.6 10*3/uL — ABNORMAL LOW (ref 0.7–4.0)
MCH: 32.7 pg (ref 26.0–34.0)
MCHC: 34.5 g/dL (ref 30.0–36.0)
MCV: 94.8 fL (ref 78.0–100.0)
Monocytes Absolute: 0.5 10*3/uL (ref 0.1–1.0)
Monocytes Relative: 7 %
NEUTROS ABS: 5.2 10*3/uL (ref 1.7–7.7)
NEUTROS PCT: 79 %
PLATELETS: 87 10*3/uL — AB (ref 150–400)
RBC: 3.06 MIL/uL — ABNORMAL LOW (ref 4.22–5.81)
RDW: 19.9 % — AB (ref 11.5–15.5)
WBC: 6.6 10*3/uL (ref 4.0–10.5)

## 2016-12-18 LAB — URINALYSIS, ROUTINE W REFLEX MICROSCOPIC
Bilirubin Urine: NEGATIVE
GLUCOSE, UA: NEGATIVE mg/dL
Ketones, ur: NEGATIVE mg/dL
LEUKOCYTES UA: NEGATIVE
NITRITE: NEGATIVE
Protein, ur: 30 mg/dL — AB
SPECIFIC GRAVITY, URINE: 1.015 (ref 1.005–1.030)
pH: 5 (ref 5.0–8.0)

## 2016-12-18 LAB — COMPREHENSIVE METABOLIC PANEL
ALT: 19 U/L (ref 17–63)
ANION GAP: 10 (ref 5–15)
AST: 49 U/L — ABNORMAL HIGH (ref 15–41)
Albumin: 1.9 g/dL — ABNORMAL LOW (ref 3.5–5.0)
Alkaline Phosphatase: 72 U/L (ref 38–126)
BUN: 63 mg/dL — ABNORMAL HIGH (ref 6–20)
CALCIUM: 8.8 mg/dL — AB (ref 8.9–10.3)
CO2: 21 mmol/L — AB (ref 22–32)
CREATININE: 3.27 mg/dL — AB (ref 0.61–1.24)
Chloride: 99 mmol/L — ABNORMAL LOW (ref 101–111)
GFR, EST AFRICAN AMERICAN: 21 mL/min — AB (ref >60)
GFR, EST NON AFRICAN AMERICAN: 18 mL/min — AB (ref >60)
Glucose, Bld: 88 mg/dL (ref 65–99)
Potassium: 5.5 mmol/L — ABNORMAL HIGH (ref 3.5–5.1)
SODIUM: 130 mmol/L — AB (ref 135–145)
TOTAL PROTEIN: 8.1 g/dL (ref 6.5–8.1)
Total Bilirubin: 2.6 mg/dL — ABNORMAL HIGH (ref 0.3–1.2)

## 2016-12-18 LAB — BRAIN NATRIURETIC PEPTIDE: B Natriuretic Peptide: 213 pg/mL — ABNORMAL HIGH (ref 0.0–100.0)

## 2016-12-18 LAB — I-STAT TROPONIN, ED: TROPONIN I, POC: 0.01 ng/mL (ref 0.00–0.08)

## 2016-12-18 MED ORDER — SUCRALFATE 1 GM/10ML PO SUSP
1.0000 g | Freq: Three times a day (TID) | ORAL | Status: DC
  Administered 2016-12-19 – 2016-12-27 (×26): 1 g via ORAL
  Filled 2016-12-18 (×27): qty 10

## 2016-12-18 MED ORDER — ADULT MULTIVITAMIN W/MINERALS CH
1.0000 | ORAL_TABLET | Freq: Every day | ORAL | Status: DC
  Administered 2016-12-20 – 2016-12-27 (×8): 1 via ORAL
  Filled 2016-12-18 (×9): qty 1

## 2016-12-18 MED ORDER — ONDANSETRON HCL 4 MG/2ML IJ SOLN
4.0000 mg | Freq: Four times a day (QID) | INTRAMUSCULAR | Status: DC | PRN
  Administered 2016-12-19 (×3): 4 mg via INTRAVENOUS
  Filled 2016-12-18 (×3): qty 2

## 2016-12-18 MED ORDER — PRO-STAT SUGAR FREE PO LIQD
30.0000 mL | Freq: Three times a day (TID) | ORAL | Status: DC
  Administered 2016-12-19 – 2016-12-22 (×8): 30 mL via ORAL
  Filled 2016-12-18 (×14): qty 30

## 2016-12-18 MED ORDER — LOPERAMIDE HCL 2 MG PO CAPS: 2.0000 mg | ORAL_CAPSULE | ORAL | Status: DC | PRN

## 2016-12-18 MED ORDER — ACETAMINOPHEN 650 MG RE SUPP: 650.0000 mg | Freq: Four times a day (QID) | RECTAL | Status: DC | PRN

## 2016-12-18 MED ORDER — SACCHAROMYCES BOULARDII 250 MG PO CAPS
250.0000 mg | ORAL_CAPSULE | Freq: Two times a day (BID) | ORAL | Status: DC
  Administered 2016-12-19 – 2016-12-27 (×17): 250 mg via ORAL
  Filled 2016-12-18 (×17): qty 1

## 2016-12-18 MED ORDER — ONDANSETRON HCL 4 MG PO TABS
4.0000 mg | ORAL_TABLET | Freq: Four times a day (QID) | ORAL | Status: DC | PRN
  Administered 2016-12-20: 4 mg via ORAL
  Filled 2016-12-18: qty 1

## 2016-12-18 MED ORDER — SODIUM CHLORIDE 0.9 % IV BOLUS (SEPSIS)
1000.0000 mL | Freq: Once | INTRAVENOUS | Status: AC
  Administered 2016-12-18: 1000 mL via INTRAVENOUS

## 2016-12-18 MED ORDER — BRIMONIDINE TARTRATE 0.15 % OP SOLN
1.0000 [drp] | Freq: Every day | OPHTHALMIC | Status: DC
  Administered 2016-12-19 – 2016-12-27 (×8): 1 [drp] via OPHTHALMIC
  Filled 2016-12-18: qty 5

## 2016-12-18 MED ORDER — IPRATROPIUM-ALBUTEROL 0.5-2.5 (3) MG/3ML IN SOLN: 3.0000 mL | RESPIRATORY_TRACT | Status: DC | PRN

## 2016-12-18 MED ORDER — NADOLOL 20 MG PO TABS
20.0000 mg | ORAL_TABLET | Freq: Every day | ORAL | Status: DC
  Administered 2016-12-19 – 2016-12-20 (×2): 20 mg via ORAL
  Filled 2016-12-18 (×2): qty 1

## 2016-12-18 MED ORDER — SODIUM CHLORIDE 0.9 % IV SOLN
2000.0000 mg | Freq: Once | INTRAVENOUS | Status: AC
  Administered 2016-12-18: 2000 mg via INTRAVENOUS
  Filled 2016-12-18: qty 2000

## 2016-12-18 MED ORDER — SODIUM CHLORIDE 0.9 % IV SOLN
INTRAVENOUS | Status: AC
  Administered 2016-12-19: 01:00:00 via INTRAVENOUS

## 2016-12-18 MED ORDER — ATORVASTATIN CALCIUM 40 MG PO TABS
40.0000 mg | ORAL_TABLET | Freq: Every day | ORAL | Status: DC
  Administered 2016-12-19 – 2016-12-27 (×9): 40 mg via ORAL
  Filled 2016-12-18 (×9): qty 1

## 2016-12-18 MED ORDER — ACETAMINOPHEN 325 MG PO TABS
650.0000 mg | ORAL_TABLET | Freq: Four times a day (QID) | ORAL | Status: DC | PRN
Start: 2016-12-18 — End: 2016-12-23
  Administered 2016-12-20: 650 mg via ORAL
  Filled 2016-12-18: qty 2

## 2016-12-18 MED ORDER — PANTOPRAZOLE SODIUM 40 MG PO TBEC
40.0000 mg | DELAYED_RELEASE_TABLET | Freq: Two times a day (BID) | ORAL | Status: DC
  Administered 2016-12-19 – 2016-12-27 (×16): 40 mg via ORAL
  Filled 2016-12-18 (×17): qty 1

## 2016-12-18 MED ORDER — VANCOMYCIN HCL 10 G IV SOLR
1500.0000 mg | INTRAVENOUS | Status: DC
  Administered 2016-12-21 – 2016-12-23 (×2): 1500 mg via INTRAVENOUS
  Filled 2016-12-18 (×2): qty 1500

## 2016-12-18 MED ORDER — SODIUM CHLORIDE 0.9% FLUSH
3.0000 mL | Freq: Two times a day (BID) | INTRAVENOUS | Status: DC
  Administered 2016-12-19 – 2016-12-27 (×11): 3 mL via INTRAVENOUS

## 2016-12-18 MED ORDER — METHOCARBAMOL 500 MG PO TABS
250.0000 mg | ORAL_TABLET | Freq: Three times a day (TID) | ORAL | Status: DC | PRN
Start: 2016-12-18 — End: 2016-12-20

## 2016-12-18 MED ORDER — OXYCODONE HCL 5 MG PO TABS: 5.0000 mg | ORAL_TABLET | ORAL | Status: DC | PRN

## 2016-12-18 NOTE — Progress Notes (Signed)
Pharmacy Antibiotic Note  Jeffery [name] is a 66 y.o. male admitted on 12/18/2016 with weakness/ARF, left foot wound, poss osteomyelitis on Bactrim at [facility] .  Pharmacy has been consulted for Vancomycin  dosing.  Plan: Vancomycin 2 g IV now, then 1500 mg IV q48h F/U renal function    Temp (24hrs), Avg:97.9 F (36.6 C), Min:97.7 F (36.5 C), Max:98.1 F (36.7 C)   Recent Labs Lab 12/18/16 1954 12/18/16 2007  WBC 6.6  --   CREATININE 3.27* 3.30*    Estimated Creatinine Clearance: 27.9 mL/min (A) (by C-G formula based on SCr of 3.3 mg/dL (H)).    No Known Allergies   Eddie Candle 12/18/2016 11:48 PM

## 2016-12-18 NOTE — ED Triage Notes (Signed)
Pt arrived from local nursing facility after [facility] was called for SOB. Pt was found to have a cough and c/o of generalized weakness. V/s within normal limits. Pt had his left great amputated, EDP visualized site upon arrival to ED. Pt had right leg amputated previously. Pt has hx of DM2. Pt is alert but slightly confused.

## 2016-12-18 NOTE — ED Provider Notes (Signed)
Complains of shortness of breath for several months, becoming worse over the past 2 days. Also admits to diminished appetite and abdominal pain which is chronic lasting several months. She is chronically ill-appearing of, appears mildly dyspneic. Lungs clear auscultation abdomen distended, obese, nontender. Genitalia normal male. Right lower extremity AKA, stump is clean and left lower extremity with packed surgical wound at foot distal aspect of foot is reddened   Doug Sou, MD 12/19/16 0028

## 2016-12-18 NOTE — Telephone Encounter (Signed)
Unable to reach pt letter mailed 

## 2016-12-18 NOTE — H&P (Signed)
History and Physical    Juanito Gonyer ZOX:[id number] DOB: December 19, 1950 DOA: 12/18/2016  PCP: Julian Hy, MD   Patient coming from: SNF   Chief Complaint: Nausea, vomiting, gen weakness   HPI: Jeffery [name] is a 66 y.o. male with medical history significant for end-stage liver cirrhosis, insulin-dependent diabetes mellitus, and chronic kidney disease stage III who presents from his nursing facility for evaluation of nausea, vomiting, and generalized weakness. Patient was admitted to the hospital in February 2018 with osteomyelitis involving the left foot; he underwent first ray amputation with clean margins reported and was discharged to a skilled nursing facility. A culture reportedly grew staph and he was placed on Bactrim empirically on 12/12/2016. Since that time, he has been evaluated by orthopedics and an outpatient MRI was ordered but not clear if yet performed. Since that time, he has been noted to develop nausea with nonbloody nonbilious vomiting at his skilled nursing facility and has grown increasingly weak generally. Patient has complained of some nausea, but no other specific complaints. No fevers have been reported at the skilled nursing facility.   ED Course: Upon arrival to the ED, patient is found to be afebrile, saturating well on room air, blood pressure 90/65, and normal heart rate and respirations. EKG features a junctional rhythm with RBBB. Chest x-ray is negative for acute cardiopulmonary disease. Chemistry panel features a sodium of 1:30, potassium 5.5, BUN 63, and serum creatinine of 3.27, up from an apparent baseline of 1.6. Albumin is only 1.9 and total bilirubin appears stable at 2.6. CBC features a stable normocytic anemia with hemoglobin of 10.0 and a stable thrombocytopenia with platelets 87,000. Troponin is within the normal limits, BNP is elevated to 213, and urinalysis is unremarkable. Patient was given 2 L normal saline in the emergency department. He  remained hemodynamically stable and in no acute respiratory distress. He will be admitted to the telemetry unit for ongoing evaluation and management of acute kidney injury with hyperkalemia, superimposed on chronic kidney disease stage III, possibly prerenal in the setting of vomiting, or possibly secondary to Bactrim.  Review of Systems:  All other systems reviewed and apart from HPI, are negative.  Past Medical History:  Diagnosis Date  . Arthritis   . Asthma   . Bilateral cataracts   . Blurred vision    improved now  . Cholelithiasis   . Diabetes mellitus    type II  . Eczema    inner wrist  . Hypertension   . Muscle cramps   . Necrotizing fasciitis (HCC)   . Neuropathy (HCC)   . Rash    wrist and hands since late june  . Retinal degeneration     Past Surgical History:  Procedure Laterality Date  . ABOVE KNEE LEG AMPUTATION     right  . APPLICATION OF WOUND VAC Left 11/06/2016   Procedure: APPLICATION OF WOUND VAC;  Surgeon: Eldred Manges, MD;  Location: [facility];  Service: Orthopedics;  Laterality: Left;  . COLONOSCOPY WITH PROPOFOL N/A 05/18/2016   Procedure: COLONOSCOPY WITH PROPOFOL;  Surgeon: Rachael Fee, MD;  Location: [facility];  Service: Endoscopy;  Laterality: N/A;  . ESOPHAGOGASTRODUODENOSCOPY (EGD) WITH PROPOFOL N/A 05/18/2016   Procedure: ESOPHAGOGASTRODUODENOSCOPY (EGD) WITH PROPOFOL;  Surgeon: Rachael Fee, MD;  Location: [facility];  Service: Endoscopy;  Laterality: N/A;  . ESOPHAGOGASTRODUODENOSCOPY (EGD) WITH PROPOFOL N/A 11/01/2016   Procedure: ESOPHAGOGASTRODUODENOSCOPY (EGD) WITH PROPOFOL;  Surgeon: Napoleon Form, MD;  Location: [facility];  Service: Endoscopy;  Laterality:  N/A;  . EYE SURGERY  09/2011   bilateral for retinopathy - laser eye surgery  . I&D EXTREMITY Left 10/25/2016   Procedure: LEFT FOOT DEBRIDEMENT RAY AMPUTATION PLACEMENT OF WOUND VAC;  Surgeon: Eldred Manges, MD;  Location: [facility];  Service: Orthopedics;  Laterality: Left;  .  I&D EXTREMITY Left 11/06/2016   Procedure: IRRIGATION AND DEBRIDEMENT EXTREMITY/LEFT FOOT;  Surgeon: Eldred Manges, MD;  Location: [facility];  Service: Orthopedics;  Laterality: Left;  . left metatarsal  2003   5th  . TOE AMPUTATION     Left big toe     reports that he has never smoked. He has never used smokeless tobacco. He reports that he does not drink alcohol or use drugs.  No Known Allergies  Family History  Problem Relation Age of Onset  . Cancer Mother     unknown type     Prior to Admission medications   Medication Sig Start Date End Date Taking? Authorizing Provider  atorvastatin (LIPITOR) 40 MG tablet Take 40 mg by mouth daily. 01/31/16  Yes Historical Provider, MD  brimonidine (ALPHAGAN P) 0.1 % SOLN Place 1 drop into both eyes daily.   Yes Historical Provider, MD  furosemide (LASIX) 40 MG tablet Take 40 mg by mouth daily.   Yes Historical Provider, MD  insulin aspart (NOVOLOG FLEXPEN) 100 UNIT/ML FlexPen Inject before meals and at bedtime per sliding scale: if BGL is: 200 - 250 = 2 units; 251 - 300 = 4 units; 301 - 350 = 6 units; 351 - 400 = 8 units, if greater than 400 notify MD   Yes Historical Provider, MD  loperamide (IMODIUM) 2 MG capsule Take 1 capsule (2 mg total) by mouth as needed for diarrhea or loose stools. 11/07/16  Yes Rolly Salter, MD  methocarbamol (ROBAXIN) 500 MG tablet Take 0.5 tablets (250 mg total) by mouth every 8 (eight) hours as needed for muscle spasms. 11/07/16  Yes Rolly Salter, MD  metoCLOPramide (REGLAN) 5 MG tablet Take 1 tablet (5 mg total) by mouth every 6 (six) hours as needed for nausea. 11/07/16  Yes Rolly Salter, MD  Multiple Vitamin (MULTIVITAMIN WITH MINERALS) TABS tablet Take 1 tablet by mouth daily.   Yes Historical Provider, MD  nadolol (CORGARD) 20 MG tablet Take 1 tablet (20 mg total) by mouth daily. 11/08/16  Yes Rolly Salter, MD  oxyCODONE (ROXICODONE) 5 MG immediate release tablet Take one tablet by mouth every 4 hours as needed  for pain Patient taking differently: Take 5 mg by mouth every 4 (four) hours as needed for severe pain.  11/29/16  Yes Kimber Relic, MD  pantoprazole (PROTONIX) 40 MG tablet Take 1 tablet (40 mg total) by mouth 2 (two) times daily. 11/07/16  Yes Rolly Salter, MD  saccharomyces boulardii (FLORASTOR) 250 MG capsule Take 1 capsule (250 mg total) by mouth 2 (two) times daily. 11/07/16  Yes Rolly Salter, MD  spironolactone (ALDACTONE) 100 MG tablet Take 100 mg by mouth daily.    Yes Historical Provider, MD  sucralfate (CARAFATE) 1 GM/10ML suspension Take 10 mLs (1 g total) by mouth 4 (four) times daily -  with meals and at bedtime. Patient taking differently: Take 1 g by mouth 4 (four) times daily -  before meals and at bedtime.  11/07/16  Yes Rolly Salter, MD  sulfamethoxazole-trimethoprim (BACTRIM DS,SEPTRA DS) 800-160 MG tablet Take 1 tablet by mouth 2 (two) times daily. FOR 10 DAYS (Start  date: 12/12/16) 12/12/16 12/22/16 Yes Historical Provider, MD  UNABLE TO FIND MedPass: Drink 237 ml's by mouth three times a day   Yes Historical Provider, MD  UNABLE TO FIND Protein Liquid: Drink 30 ml's by mouth three times a day for wound healing   Yes Historical Provider, MD  vitamin C (ASCORBIC ACID) 500 MG tablet Take 500 mg by mouth 2 (two) times daily. FOR 45 DAYS (Start date: 11/09/16) 11/09/16 12/24/16 Yes Historical Provider, MD  Zinc Sulfate (ZINC-220 PO) Take 220 mg by mouth daily.  11/10/16 12/25/16 Yes Historical Provider, MD  Amino Acids-Protein Hydrolys (FEEDING SUPPLEMENT, PRO-STAT SUGAR FREE 64,) LIQD Take 30 mLs by mouth 3 (three) times daily. Patient not taking: Reported on 12/18/2016 11/07/16   Rolly Salter, MD  BISMUTH TRIBROMOPH-PETROLATUM EX Apply to right foot topically every Tuesday, Thursday and Saturday    Historical Provider, MD  collagenase Melburn Popper) ointment Apply to right heel topically on Tuesday, Thursday and Saturday    Historical Provider, MD  ipratropium-albuterol (DUONEB) 0.5-2.5 (3)  MG/3ML SOLN  12/07/16   Historical Provider, MD  wound dressings gel Apply topically as needed for wound care. TheraHoney Gel - Apply to left heel and left plantar foot topically every day    Historical Provider, MD    Physical Exam: Vitals:   12/18/16 2145 12/18/16 2200 12/18/16 2215 12/18/16 2230  BP: 98/62 (!) 102/57 (!) 102/57 (!) 95/59  Pulse: 64 66 67 70  Resp: 15 16 (!) 21 19  Temp:      TempSrc:      SpO2: 100% 99% 99% 98%      Constitutional: No acute respiratory distress. Appears older than stated age, uncomfortable, clutching emesis bag. Non-toxic in appearance. Eyes: PERTLA, lids and conjunctivae normal ENMT: Mucous membranes are moist. Posterior pharynx clear of any exudate or lesions.   Neck: normal, supple, no masses, no thyromegaly Respiratory: clear to auscultation bilaterally, no wheezing, no crackles. Normal respiratory effort.   Cardiovascular: S1 & S2 heard, regular rate and rhythm. LLE pitting edema. JVP not well-visualized. Abdomen: mild distension, soft, no tenderness. Bowel sounds active.  Musculoskeletal: no clubbing / cyanosis. Status-post right AKA and left foot first ray amputation. Normal muscle tone.  Skin: Left foot status-post first ray amputation with open wound, non-draining, non-tender, and without odor or significant surrounding erythema . Skin is otherwise pale, dry, well-perfused. Neurologic: No gross facial asymmetry, PERRL, EOMI. Moves all extremities.   Psychiatric: Lethargic, cooperative.     Labs on Admission: I have personally reviewed following labs and imaging studies  CBC:  Recent Labs Lab 12/18/16 1954 12/18/16 2007  WBC 6.6  --   NEUTROABS 5.2  --   HGB 10.0* 10.5*  HCT 29.0* 31.0*  MCV 94.8  --   PLT 87*  --    Basic Metabolic Panel:  Recent Labs Lab 12/18/16 1954 12/18/16 2007  NA 130* 132*  K 5.5* 5.5*  CL 99* 99*  CO2 21*  --   GLUCOSE 88 88  BUN 63* 61*  CREATININE 3.27* 3.30*  CALCIUM 8.8*  --     GFR: Estimated Creatinine Clearance: 27.9 mL/min (A) (by C-G formula based on SCr of 3.3 mg/dL (H)). Liver Function Tests:  Recent Labs Lab 12/18/16 1954  AST 49*  ALT 19  ALKPHOS 72  BILITOT 2.6*  PROT 8.1  ALBUMIN 1.9*   No results for input(s): LIPASE, AMYLASE in the last 168 hours. No results for input(s): AMMONIA in the last 168 hours. Coagulation  Profile: No results for input(s): INR, PROTIME in the last 168 hours. Cardiac Enzymes: No results for input(s): CKTOTAL, CKMB, CKMBINDEX, TROPONINI in the last 168 hours. BNP (last 3 results) No results for input(s): PROBNP in the last 8760 hours. HbA1C: No results for input(s): HGBA1C in the last 72 hours. CBG: No results for input(s): GLUCAP in the last 168 hours. Lipid Profile: No results for input(s): CHOL, HDL, LDLCALC, TRIG, CHOLHDL, LDLDIRECT in the last 72 hours. Thyroid Function Tests: No results for input(s): TSH, T4TOTAL, FREET4, T3FREE, THYROIDAB in the last 72 hours. Anemia Panel: No results for input(s): VITAMINB12, FOLATE, FERRITIN, TIBC, IRON, RETICCTPCT in the last 72 hours. Urine analysis:    Component Value Date/Time   COLORURINE YELLOW 12/18/2016 2200   APPEARANCEUR HAZY (A) 12/18/2016 2200   LABSPEC 1.015 12/18/2016 2200   PHURINE 5.0 12/18/2016 2200   GLUCOSEU NEGATIVE 12/18/2016 2200   HGBUR SMALL (A) 12/18/2016 2200   BILIRUBINUR NEGATIVE 12/18/2016 2200   KETONESUR NEGATIVE 12/18/2016 2200   PROTEINUR 30 (A) 12/18/2016 2200   NITRITE NEGATIVE 12/18/2016 2200   LEUKOCYTESUR NEGATIVE 12/18/2016 2200   Sepsis Labs: (procalcitonin:4,lacticidven:4) )No results found for this or any previous visit (from the past 240 hour(s)).   Radiological Exams on Admission: Dg Chest Portable 1 View  Result Date: 12/18/2016 CLINICAL DATA:  Dyspnea are EXAM: PORTABLE CHEST 1 VIEW COMPARISON:  None. FINDINGS: Shallow lung inflation. The heart size and mediastinal contours are within normal limits.  Both lungs are clear. The visualized skeletal structures are unremarkable. IMPRESSION: No active disease. Electronically Signed   By: Deatra Robinson M.D.   On: 12/18/2016 19:48    EKG: Independently reviewed. Junctional rhythm, RBBB, low-voltage   Assessment/Plan  1. Acute kidney injury superimposed on CKD stage III  - SCr is 3.27, up from 1.6 in March 2018  - With recent N/V and continued diuretic use, prerenal azotemia is possible; has been on Bactrim since 12/12/16 and that is another consideration  - He was given 2 liters NS in ED  - Plan to exclude obstruction with renal US, check urine studies, continue a gentle IVF hydration with NS, hold diuretics, avoid nephrotoxins where possible, renally-dose medications, and repeat chem panel in am    2. 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  - Pt was admitted in February 2018 with left foot osteo and underwent 1st ray amputation with pathology reporting clean margins  - He had been discharged to SNF with wound vac, but was switched to wet-to-dry dressings after developing vac-associated maceration  - He had a culture grow staph per report (not sure how this was obtained) and he was started on Bactrim 12/12/16  - Saw ortho in clinic 3/29 and had MRI ordered, but does not appear to have been performed yet  - No fever or leukocytosis on admission  - Check inflammatory markers, hold Bactrim, and start empiric vancomycin   3. Liver cirrhosis  - Has esophageal varices, grade I by EGD in February 2018  - Lasix and Aldactone held on admission in light of N/V with AKI  - Continue nadolol as BP allows  - Continue Protonix    4. Hyperkalemia  - Serum potassium 5.5 on admission in setting of AKI  - He was given 2 liters NS in ED  - Monitor on telemetry and follow serial potassium levels until normalized   5. Hyponatremia  - Serum sodium 130 on admission  - Pt has mild chronic hyponatremia in setting of  cirrhosis  - He is hypovolemic on  admission and received 2 liters NS in ED  - Repeat chem panel in am   6. Normocytic anemia, thrombocytopenia  - Hgb is 10.0 on admission and platelets 87k  - Both indices appear to be stable with no evidence for active blood loss  - Follow periodic CBC      DVT prophylaxis: SCD's  Code Status: DNR Family Communication: Discussed with patient Disposition Plan: Admit to telemetry Consults called: None Admission status: Inpatient    Briscoe Deutscher, MD [facility] Pager [phone]  If 7PM-7AM, please contact night-coverage [email or web address] Password [email or web address]  12/18/2016, 11:13 PM

## 2016-12-18 NOTE — ED Provider Notes (Signed)
[facility]-EMERGENCY DEPT Provider Note   CSN: [id number] Arrival date & time: 12/18/16  1853     History   Chief Complaint Chief Complaint  Patient presents with  . Weakness    HPI Jeffery [name] is a 66 y.o. male.  The history is provided by the patient, the EMS personnel and medical records.  Weakness  Primary symptoms include no focal weakness. This is a new problem. Episode onset: 2-3 days. The problem has not changed since onset.There was no focality noted. There has been no fever. Associated symptoms include shortness of breath and vomiting. Pertinent negatives include no chest pain, no altered mental status, no confusion and no headaches. Associated medical issues do not include trauma or CVA.    66 y.o. male PMH DM2, HTN, PVD, cirrhosis, presents via EMS from SNF for dyspnea, N/V and generalized weakness. Pt reports increased work of breathing last night and was place on oxygen for comfort. Recently treated with Azithromycin for bronchitis. Currently on Bactrim for right osteomyelitis. Pt reports weeks of nausea and vomiting, no worse today. Chronic poor PO intake. Denies being short of breath on presentation. Reports no change to chronic abdominal pain. States he recently had 7L removed from abdomen via paracentesis. Per nursing home progress note, pt was recently referred to Ortho (3/29) for left foot ulcers and concern for osteomyelitis. Pt was started on Bactrim 3/27, plain films negative for bone involvement and out-patient MRI was ordered.   Past Medical History:  Diagnosis Date  . Arthritis   . Asthma   . Bilateral cataracts   . Blurred vision    improved now  . Cholelithiasis   . Diabetes mellitus    type II  . Eczema    inner wrist  . Hypertension   . Muscle cramps   . Necrotizing fasciitis (HCC)   . Neuropathy (HCC)   . Rash    wrist and hands since late june  . Retinal degeneration     Patient Active Problem List   Diagnosis Date Noted  . CKD (chronic  kidney disease), stage III 12/18/2016  . Hyponatremia 12/18/2016  . Hyperkalemia 12/18/2016  . 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 Depressed affect 12/13/2016  . Leg edema 11/24/2016  . Cirrhosis of liver (HCC)   . Multiple duodenal ulcers   . Ulcer of esophagus without bleeding   . Melena   . Chronic liver failure without hepatic coma (HCC)   . Advance care planning   . Goals of care, counseling/discussion   . Palliative care by specialist   . End stage liver disease (HCC)   . Moderate single current episode of major depressive disorder (HCC)   . Anxiety state   . Diaper candidiasis   . Somnolence   . Moderate malnutrition (HCC)   . Noninfectious gastroenteritis   . Gastrointestinal hemorrhage   . Controlled diabetes mellitus type 2 with complications (HCC)   . Osteomyelitis of left foot (HCC)   . Acute renal failure (HCC)   . Abdominal pain   . Diarrhea   . Osteomyelitis (HCC)   . C. difficile colitis 10/23/2016  . AKI (acute kidney injury) (HCC)   . General weakness   . Gastrointestinal hemorrhage with melena   . Idiopathic hypotension   . Other hyperlipidemia   . Esophageal varices without bleeding (HCC)   . Portal hypertensive gastropathy   . Cirrhosis of liver with ascites (HCC) 05/12/2016  .  Special screening for malignant neoplasms, colon 05/12/2016  . Coagulopathy (HCC) 04/28/2016  . Hemolytic anemia (HCC) 03/24/2016  . G6PD deficiency anemia (HCC) 03/24/2016  . Thrombocytopenia (HCC) 03/24/2016  . Generalized psoriasis 03/20/2016  . Gilbert syndrome 03/20/2016  . Pancytopenia, acquired (HCC) 03/20/2016  . Hematemesis without nausea 03/20/2016    Past Surgical History:  Procedure Laterality Date  . ABOVE KNEE LEG AMPUTATION     right  . APPLICATION OF WOUND VAC Left 11/06/2016   Procedure: APPLICATION OF WOUND VAC;  Surgeon: Eldred Manges, MD;  Location: [facility] OR;  Service: Orthopedics;   Laterality: Left;  . COLONOSCOPY WITH PROPOFOL N/A 05/18/2016   Procedure: COLONOSCOPY WITH PROPOFOL;  Surgeon: Rachael Fee, MD;  Location: [facility];  Service: Endoscopy;  Laterality: N/A;  . ESOPHAGOGASTRODUODENOSCOPY (EGD) WITH PROPOFOL N/A 05/18/2016   Procedure: ESOPHAGOGASTRODUODENOSCOPY (EGD) WITH PROPOFOL;  Surgeon: Rachael Fee, MD;  Location: [facility];  Service: Endoscopy;  Laterality: N/A;  . ESOPHAGOGASTRODUODENOSCOPY (EGD) WITH PROPOFOL N/A 11/01/2016   Procedure: ESOPHAGOGASTRODUODENOSCOPY (EGD) WITH PROPOFOL;  Surgeon: Napoleon Form, MD;  Location: [facility];  Service: Endoscopy;  Laterality: N/A;  . EYE SURGERY  09/2011   bilateral for retinopathy - laser eye surgery  . I&D EXTREMITY Left 10/25/2016   Procedure: LEFT FOOT DEBRIDEMENT RAY AMPUTATION PLACEMENT OF WOUND VAC;  Surgeon: Eldred Manges, MD;  Location: [facility] OR;  Service: Orthopedics;  Laterality: Left;  . I&D EXTREMITY Left 11/06/2016   Procedure: IRRIGATION AND DEBRIDEMENT EXTREMITY/LEFT FOOT;  Surgeon: Eldred Manges, MD;  Location: [facility] OR;  Service: Orthopedics;  Laterality: Left;  . left metatarsal  2003   5th  . TOE AMPUTATION     Left big toe       Home Medications    Prior to Admission medications   Medication Sig Start Date End Date Taking? Authorizing Provider  atorvastatin (LIPITOR) 40 MG tablet Take 40 mg by mouth daily. 01/31/16  Yes Historical Provider, MD  brimonidine (ALPHAGAN P) 0.1 % SOLN Place 1 drop into both eyes daily.   Yes Historical Provider, MD  furosemide (LASIX) 40 MG tablet Take 40 mg by mouth daily.   Yes Historical Provider, MD  insulin aspart (NOVOLOG FLEXPEN) 100 UNIT/ML FlexPen Inject before meals and at bedtime per sliding scale: if BGL is: 200 - 250 = 2 units; 251 - 300 = 4 units; 301 - 350 = 6 units; 351 - 400 = 8 units, if greater than 400 notify MD   Yes Historical Provider, MD  loperamide (IMODIUM) 2 MG capsule Take 1 capsule (2 mg total) by mouth as needed for  diarrhea or loose stools. 11/07/16  Yes Rolly Salter, MD  methocarbamol (ROBAXIN) 500 MG tablet Take 0.5 tablets (250 mg total) by mouth every 8 (eight) hours as needed for muscle spasms. 11/07/16  Yes Rolly Salter, MD  metoCLOPramide (REGLAN) 5 MG tablet Take 1 tablet (5 mg total) by mouth every 6 (six) hours as needed for nausea. 11/07/16  Yes Rolly Salter, MD  Multiple Vitamin (MULTIVITAMIN WITH MINERALS) TABS tablet Take 1 tablet by mouth daily.   Yes Historical Provider, MD  nadolol (CORGARD) 20 MG tablet Take 1 tablet (20 mg total) by mouth daily. 11/08/16  Yes Rolly Salter, MD  oxyCODONE (ROXICODONE) 5 MG immediate release tablet Take one tablet by mouth every 4 hours as needed for pain Patient taking differently: Take 5 mg by mouth every 4 (four) hours as needed for severe pain.  11/29/16  Yes Kimber Relic, MD  pantoprazole (PROTONIX) 40 MG tablet Take 1 tablet (40 mg total) by mouth 2 (two) times daily. 11/07/16  Yes Rolly Salter, MD  saccharomyces boulardii (FLORASTOR) 250 MG capsule Take 1 capsule (250 mg total) by mouth 2 (two) times daily. 11/07/16  Yes Rolly Salter, MD  spironolactone (ALDACTONE) 100 MG tablet Take 100 mg by mouth daily.    Yes Historical Provider, MD  sucralfate (CARAFATE) 1 GM/10ML suspension Take 10 mLs (1 g total) by mouth 4 (four) times daily -  with meals and at bedtime. Patient taking differently: Take 1 g by mouth 4 (four) times daily -  before meals and at bedtime.  11/07/16  Yes Rolly Salter, MD  sulfamethoxazole-trimethoprim (BACTRIM DS,SEPTRA DS) 800-160 MG tablet Take 1 tablet by mouth 2 (two) times daily. FOR 10 DAYS (Start date: 12/12/16) 12/12/16 12/22/16 Yes Historical Provider, MD  UNABLE TO FIND MedPass: Drink 237 ml's by mouth three times a day   Yes Historical Provider, MD  UNABLE TO FIND Protein Liquid: Drink 30 ml's by mouth three times a day for wound healing   Yes Historical Provider, MD  vitamin C (ASCORBIC ACID) 500 MG tablet Take 500 mg  by mouth 2 (two) times daily. FOR 45 DAYS (Start date: 11/09/16) 11/09/16 12/24/16 Yes Historical Provider, MD  Zinc Sulfate (ZINC-220 PO) Take 220 mg by mouth daily.  11/10/16 12/25/16 Yes Historical Provider, MD  Amino Acids-Protein Hydrolys (FEEDING SUPPLEMENT, PRO-STAT SUGAR FREE 64,) LIQD Take 30 mLs by mouth 3 (three) times daily. Patient not taking: Reported on 12/18/2016 11/07/16   Rolly Salter, MD  azithromycin (ZITHROMAX) 500 MG tablet Take 500 mg by mouth daily.    Historical Provider, MD  BISMUTH TRIBROMOPH-PETROLATUM EX Apply to right foot topically every Tuesday, Thursday and Saturday    Historical Provider, MD  collagenase (SANTYL) ointment Apply to right heel topically on Tuesday, Thursday and Saturday    Historical Provider, MD  ipratropium-albuterol (DUONEB) 0.5-2.5 (3) MG/3ML SOLN  12/07/16   Historical Provider, MD  nystatin (MYCOSTATIN/NYSTOP) powder Apply topically 2 (two) times daily. Patient not taking: Reported on 12/18/2016 11/07/16   Rolly Salter, MD  wound dressings gel Apply topically as needed for wound care. TheraHoney Gel - Apply to left heel and left plantar foot topically every day    Historical Provider, MD    Family History Family History  Problem Relation Age of Onset  . Cancer Mother     unknown type    Social History Social History  Substance Use Topics  . Smoking status: Never Smoker  . Smokeless tobacco: Never Used  . Alcohol use No     Allergies   Patient has no known allergies.   Review of Systems Review of Systems  Constitutional: Positive for fatigue. Negative for fever.  Respiratory: Positive for shortness of breath. Negative for chest tightness and wheezing.   Cardiovascular: Negative for chest pain, palpitations and leg swelling.  Gastrointestinal: Positive for abdominal distention, abdominal pain (chronic), nausea and vomiting. Negative for diarrhea.  Neurological: Positive for weakness. Negative for focal weakness and headaches.    Psychiatric/Behavioral: Negative for confusion.     Physical Exam Updated Vital Signs BP (!) 95/59   Pulse 70   Temp 98.1 F (36.7 C) (Rectal)   Resp 19   SpO2 98%   Physical Exam  Constitutional: No distress.  Appears chronically ill  HENT:  Head: Normocephalic and atraumatic.  Eyes: Conjunctivae and  EOM are normal. Pupils are equal, round, and reactive to light.  Neck: Normal range of motion. Neck supple.  Cardiovascular: Normal rate and regular rhythm.   No murmur heard. Pulmonary/Chest: Effort normal and breath sounds normal. No respiratory distress. He has no rales.  Conversationally dyspneic  Abdominal: Soft. He exhibits distension. There is no tenderness. There is no rebound and no guarding.  Musculoskeletal:  Right AKA. Surgical wound to left foot with multiple areas packed, mild erythema around packed wounds, no swelling.   Neurological: He is alert.  Skin: Skin is warm and dry. Capillary refill takes less than 2 seconds. He is not diaphoretic. There is pallor.  Psychiatric: He has a normal mood and affect.  Nursing note and vitals reviewed.    ED Treatments / Results  Labs (all labs ordered are listed, but only abnormal results are displayed) Labs Reviewed  CBC WITH DIFFERENTIAL/PLATELET - Abnormal; Notable for the following:       Result Value   RBC 3.06 (*)    Hemoglobin 10.0 (*)    HCT 29.0 (*)    RDW 19.9 (*)    Platelets 87 (*)    Lymphs Abs 0.6 (*)    All other components within normal limits  BRAIN NATRIURETIC PEPTIDE - Abnormal; Notable for the following:    B Natriuretic Peptide 213.0 (*)    All other components within normal limits  COMPREHENSIVE METABOLIC PANEL - Abnormal; Notable for the following:    Sodium 130 (*)    Potassium 5.5 (*)    Chloride 99 (*)    CO2 21 (*)    BUN 63 (*)    Creatinine, Ser 3.27 (*)    Calcium 8.8 (*)    Albumin 1.9 (*)    AST 49 (*)    Total Bilirubin 2.6 (*)    GFR calc non Af Amer 18 (*)    GFR calc  Af Amer 21 (*)    All other components within normal limits  URINALYSIS, ROUTINE W REFLEX MICROSCOPIC - Abnormal; Notable for the following:    APPearance HAZY (*)    Hgb urine dipstick SMALL (*)    Protein, ur 30 (*)    Bacteria, UA RARE (*)    Squamous Epithelial / LPF 0-5 (*)    All other components within normal limits  I-STAT CHEM 8, ED - Abnormal; Notable for the following:    Sodium 132 (*)    Potassium 5.5 (*)    Chloride 99 (*)    BUN 61 (*)    Creatinine, Ser 3.30 (*)    Calcium, Ion 1.12 (*)    Hemoglobin 10.5 (*)    HCT 31.0 (*)    All other components within normal limits  I-STAT TROPOININ, ED    EKG  EKG Interpretation  Date/Time:  Monday December 18 2016 19:16:02 EDT Ventricular Rate:  66 PR Interval:    QRS Duration: 147 QT Interval:  539 QTC Calculation: 565 R Axis:   0 Text Interpretation:  Sinus rhythm Prolonged PR interval Right bundle branch block Abnormal lateral Q waves No significant change since last tracing Confirmed by Ethelda Chick  MD, SAM [id number]) on 12/18/2016 7:28:15 PM       Radiology Dg Chest Portable 1 View  Result Date: 12/18/2016 CLINICAL DATA:  Dyspnea are EXAM: PORTABLE CHEST 1 VIEW COMPARISON:  None. FINDINGS: Shallow lung inflation. The heart size and mediastinal contours are within normal limits. Both lungs are clear. The visualized skeletal structures are unremarkable. IMPRESSION:  No active disease. Electronically Signed   By: Deatra Robinson M.D.   On: 12/18/2016 19:48    Procedures Procedures (including critical care time)  Medications Ordered in ED Medications  sodium chloride 0.9 % bolus 1,000 mL (1,000 mLs Intravenous New Bag/Given 12/18/16 2143)  sodium chloride 0.9 % bolus 1,000 mL (1,000 mLs Intravenous New Bag/Given 12/18/16 2141)     Initial Impression / Assessment and Plan / ED Course  I have reviewed the triage vital signs and the nursing notes.  Pertinent labs & imaging results that were available during my care of the  patient were reviewed by me and considered in my medical decision making (see chart for details).    66 y.o. male PMH poorly controlled DM presents from SNF for generalized weakness. AF, sating well on RA, no tachypnea or increased WOB. Lungs CTAB. BP lower end of normal, SBP 90-100s with MAPs in 70s. Doubt sepsis. Abdomen soft, distended w/o ascites. Doubt SBP. Chronic left foot wound, that appears stable from Orthopaedic evaluation on 3/29. - AKI, Cr 3.3 from 1.6, BUN 61. Hyperkalemia 5.5 w/o EKG changes, given 2L NS bolus.  - Hb stable 10. Thrombocytopenia 87, baseline - CXR w/o evidence of PNA or pulmonary edema Care transferred to hospitalist team for admission.  Discussed with my attending physician, Dr Ethelda Chick  Final Clinical Impressions(s) / ED Diagnoses   Final diagnoses:  AKI (acute kidney injury) (HCC)  Hyperkalemia    New Prescriptions New Prescriptions   No medications on file     Pablo Ledger, MD 12/18/16 2310    Doug Sou, MD 12/19/16 0028

## 2016-12-19 ENCOUNTER — Inpatient Hospital Stay (HOSPITAL_COMMUNITY): Payer: BC Managed Care – PPO

## 2016-12-19 DIAGNOSIS — R531 Weakness: Secondary | ICD-10-CM

## 2016-12-19 DIAGNOSIS — E11621 Type 2 diabetes mellitus with foot ulcer: Secondary | ICD-10-CM

## 2016-12-19 DIAGNOSIS — L97524 Non-pressure chronic ulcer of other part of left foot with necrosis of bone: Secondary | ICD-10-CM

## 2016-12-19 LAB — POTASSIUM
Potassium: 5.2 mmol/L — ABNORMAL HIGH (ref 3.5–5.1)
Potassium: 5.2 mmol/L — ABNORMAL HIGH (ref 3.5–5.1)

## 2016-12-19 LAB — SEDIMENTATION RATE: Sed Rate: 91 mm/hr — ABNORMAL HIGH (ref 0–16)

## 2016-12-19 LAB — COMPREHENSIVE METABOLIC PANEL
ALBUMIN: 1.6 g/dL — AB (ref 3.5–5.0)
ALK PHOS: 63 U/L (ref 38–126)
ALT: 17 U/L (ref 17–63)
AST: 43 U/L — AB (ref 15–41)
Anion gap: 13 (ref 5–15)
BILIRUBIN TOTAL: 2.5 mg/dL — AB (ref 0.3–1.2)
BUN: 63 mg/dL — AB (ref 6–20)
CALCIUM: 8.4 mg/dL — AB (ref 8.9–10.3)
CO2: 17 mmol/L — ABNORMAL LOW (ref 22–32)
CREATININE: 3.15 mg/dL — AB (ref 0.61–1.24)
Chloride: 101 mmol/L (ref 101–111)
GFR calc Af Amer: 22 mL/min — ABNORMAL LOW (ref >60)
GFR calc non Af Amer: 19 mL/min — ABNORMAL LOW (ref >60)
GLUCOSE: 83 mg/dL (ref 65–99)
Potassium: 5.2 mmol/L — ABNORMAL HIGH (ref 3.5–5.1)
Sodium: 131 mmol/L — ABNORMAL LOW (ref 135–145)
TOTAL PROTEIN: 7.5 g/dL (ref 6.5–8.1)

## 2016-12-19 LAB — GLUCOSE, CAPILLARY
GLUCOSE-CAPILLARY: 79 mg/dL (ref 65–99)
Glucose-Capillary: 81 mg/dL (ref 65–99)

## 2016-12-19 LAB — PROTIME-INR
INR: 2.35
Prothrombin Time: 26.1 seconds — ABNORMAL HIGH (ref 11.4–15.2)

## 2016-12-19 LAB — C-REACTIVE PROTEIN: CRP: 6.6 mg/dL — ABNORMAL HIGH (ref <1.0)

## 2016-12-19 LAB — NA AND K (SODIUM & POTASSIUM), RAND UR
Potassium Urine: 39 mmol/L
Sodium, Ur: 40 mmol/L

## 2016-12-19 LAB — CBG MONITORING, ED: GLUCOSE-CAPILLARY: 78 mg/dL (ref 65–99)

## 2016-12-19 LAB — CREATININE, URINE, RANDOM: Creatinine, Urine: 128.85 mg/dL

## 2016-12-19 LAB — MRSA PCR SCREENING: MRSA by PCR: POSITIVE — AB

## 2016-12-19 MED ORDER — SODIUM BICARBONATE 8.4 % IV SOLN
INTRAVENOUS | Status: DC
  Administered 2016-12-19: 21:00:00 via INTRAVENOUS
  Filled 2016-12-19 (×2): qty 150

## 2016-12-19 MED ORDER — SODIUM POLYSTYRENE SULFONATE 15 GM/60ML PO SUSP
30.0000 g | Freq: Once | ORAL | Status: AC
  Administered 2016-12-19: 30 g via ORAL
  Filled 2016-12-19: qty 120

## 2016-12-19 MED ORDER — ENSURE ENLIVE PO LIQD
237.0000 mL | Freq: Two times a day (BID) | ORAL | Status: DC
  Administered 2016-12-19 – 2016-12-27 (×6): 237 mL via ORAL

## 2016-12-19 MED ORDER — IPRATROPIUM-ALBUTEROL 0.5-2.5 (3) MG/3ML IN SOLN: 3.0000 mL | RESPIRATORY_TRACT | Status: DC | PRN

## 2016-12-19 MED ORDER — STERILE WATER FOR INJECTION IJ SOLN
INTRAVENOUS | Status: DC
  Administered 2016-12-19: 13:00:00 via INTRAVENOUS
  Filled 2016-12-19 (×2): qty 150

## 2016-12-19 MED ORDER — IPRATROPIUM-ALBUTEROL 0.5-2.5 (3) MG/3ML IN SOLN
3.0000 mL | Freq: Four times a day (QID) | RESPIRATORY_TRACT | Status: DC
Start: 2016-12-19 — End: 2016-12-19
  Administered 2016-12-19: 3 mL via RESPIRATORY_TRACT
  Filled 2016-12-19 (×2): qty 3

## 2016-12-19 NOTE — Progress Notes (Signed)
Notified at this time of positive MRSA pcr. Initiated contact precautions.  Larey Days, RN

## 2016-12-19 NOTE — Consult Note (Signed)
WOC Nurse wound consult note Reason for Consult: Consult requested for left foot.  Pt has been followed by ortho service prior to admission, according to the EMR. Wound type: Dark reddish-purple Deep tissue injury to left heel, beginning to evolve into a stage 2 pressure injury; 1.5X1.5X.1cm, pink and moist, small amt pink drainage Pressure Injury POA: Yes Left plantar foot with chronic full thickness wound; 1X2X.8cm, red and moist, small amt yellow drainage, no odor, bone palpable when probed with a swab, dry crusted callous surrounding outer wound. Left outer foot with chronic full thickness wound; 4X2.3X.5cm, red and moist, small amt yellow drainage, no odor. Dressing procedure/placement/frequency: Float heel to reduce pressure.  Foam dressing to protect heel from further injury.  Moist gauze dressing to plantar and outer foot wounds.  Pt can resume follow-up with ortho service after discharge. No family members present to discuss plan of care and pt does not appear to understand. Please re-consult if further assistance is needed.  Thank-you,  Cammie Mcgee MSN, RN, CWOCN, [place], CNS [phone]

## 2016-12-19 NOTE — Progress Notes (Signed)
Initial Nutrition Assessment  DOCUMENTATION CODES:    Not applicable  INTERVENTION:  Continue 30 ml Prostat po TID, each supplement provides 100 kcal and 15 grams of protein.   Continue Ensure Enlive po BID, each supplement provides 350 kcal and 20 grams of protein.  Encourage adequate PO intake.   NUTRITION DIAGNOSIS:   Increased nutrient needs related to chronic illness as evidenced by estimated needs.  GOAL:   Patient will meet greater than or equal to 90% of their needs  MONITOR:   PO intake, Supplement acceptance, Diet advancement, Labs, Weight trends, Skin, I & O's  REASON FOR ASSESSMENT:   Malnutrition Screening Tool    ASSESSMENT:   66 y.o. male with medical history significant for end-stage liver cirrhosis, insulin-dependent diabetes mellitus, and chronic kidney disease stage III who presents from his nursing facility for evaluation of nausea, vomiting, and generalized weakness.  Pt with R AKA.   Pt with nausea and abdominal pains during time of visit. Pt unable to respond to questions asked as pt in pain.No family at bedside.  RD unable to obtain most recent nutrition history. Per RN, pt only consumed sips of water this AM. Per weight records, pt with a 13% weight loss in 1 month. Noted pt has been on diuretic medication. Weight loss may be related to fluid status. Pt currently has Ensure and Prostat ordered. RD to continue with current orders.   Limited Nutrition-Focused physical exam completed. Findings are no fat depletion, moderate muscle depletion, and no edema.   Labs and medications reviewed.   Diet Order:  Diet full liquid Room service appropriate? Yes; Fluid consistency: Thin  Skin:  Wound (see comment) (Diabetic ulcer L foot)  Last BM:  Unknown  Height:   Ht Readings from Last 1 Encounters:  12/19/16  (1.854 m)    Weight:   Wt Readings from Last 1 Encounters:  12/19/16 225 lb 15.5 oz (102.5 kg)    Ideal Body Weight:  77 kg (adjusted  for R AKA)  BMI:  Body mass index is 29.81 kg/m.  Estimated Nutritional Needs:   Kcal:  2200-2400  Protein:  105-120 grams  Fluid:  Per MD  EDUCATION NEEDS:   No education needs identified at this time  Roslyn Smiling, MS, RD, LDN Pager # [phone] After hours/ weekend pager # [phone]

## 2016-12-19 NOTE — Progress Notes (Signed)
PROGRESS NOTE  Jeffery [name]  ZOX:[id number]  DOB: 1951-08-27  DOA: 12/18/2016 PCP: Julian Hy, MD Outpatient Specialists:  Hospital course: Jeffery [name] is a 66 y.o. male with medical history significant for end-stage liver cirrhosis, insulin-dependent diabetes mellitus, and chronic kidney disease stage III who presents from his nursing facility for evaluation of nausea, vomiting, and generalized weakness. Patient was admitted to the hospital in February 2018 with osteomyelitis involving the left foot; he underwent first ray amputation with clean margins reported and was discharged to a skilled nursing facility. A culture reportedly grew staph and he was placed on Bactrim empirically on 12/12/2016. Since that time, he has been evaluated by orthopedics and an outpatient MRI was ordered but not clear if yet performed. Since that time, he has been noted to develop nausea with nonbloody nonbilious vomiting at his skilled nursing facility and has grown increasingly weak generally. Patient has complained of some nausea, but no other specific complaints. No fevers have been reported at the skilled nursing facility.   Assessment & Plan:  1. Acute kidney injury superimposed on CKD stage III  - SCr is 3.27, up from 1.6 in March 2018  - With recent N/V and continued diuretic use, appears clinically dehydrated, poor oral intake; has been on Bactrim since 12/12/16  - He was given 2 liters NS in ED  - hyperkalemia persists, and metabolic acidosis, will start bicarbonate infusion, follow daily renal function panel. - monitor on telemetry, renally dose meds and avoid nephrotoxins as possible  - check bladder scan, place foley catheter to better monitor output  2. 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  - Pt was admitted in February 2018 with left foot osteo and underwent 1st ray amputation with pathology reporting clean margins  - He had been discharged to SNF with wound vac, but  was switched to wet-to-dry dressings after developing vac-associated maceration  - He had a culture grow staph per report, he is MRSA positive, and he was started on Bactrim 12/12/16  - Saw ortho in clinic 3/29 and had MRI ordered, but not done yet, he is too medically unstable to do it now - No fever or leukocytosis on admission  -started empiric vancomycin and wound care consult requested - float the left heal to prevent further pressure damage  3. Liver cirrhosis  - Has esophageal varices, grade I by EGD in February 2018  - Lasix and Aldactone held on admission in light of N/V with AKI  - Continue nadolol as BP allows  - Continue Protonix    4. Hyperkalemia  - Serum potassium 5.5 on admission - He was given 2 liters NS in ED  - Kayexalate ordered, follow BMP, follow telemetry  5. Hyponatremia  - Serum sodium 130 on admission  - Pt has mild chronic hyponatremia in setting of cirrhosis  - He is hypovolemic on admission and received 2 liters NS in ED  - Follow  6. Normocytic anemia, thrombocytopenia  - Hgb is 10.0 on admission and platelets 87k  - Both indices appear to be stable with no evidence for active blood loss  - Follow periodic CBC    7. Hypotension - improved with hydration   DVT prophylaxis: SCDs  Code Status: DNR Family Communication: Discussed with patient Disposition Plan: Admit to telemetry Admission status: Inpatient   Subjective: Pt appears ill and reports abdominal discomfort  Review of systems Pt is unable to participate secondary to current medical condition  Objective: Vitals:   12/19/16 0053  12/19/16 0254 12/19/16 0513 12/19/16 0832  BP: (!) 119/45  (!) 110/54 (!) 107/50  Pulse: 70  72 72  Resp: Temp: 97.6 F (36.4 C)  98.9 F (37.2 C) 98 F (36.7 C)  TempSrc:    Oral  SpO2: 100%  100% 100%  Weight: 102.5 kg (226 lb) 102.5 kg (225 lb 15.5 oz)    Height:  (1.854 m)       Intake/Output Summary (Last 24 hours) at  12/19/16 1006 Last data filed at 12/19/16 0600  Gross per 24 hour  Intake              760 ml  Output              450 ml  Net              310 ml   Filed Weights   12/19/16 0053 12/19/16 0254  Weight: 102.5 kg (226 lb) 102.5 kg (225 lb 15.5 oz)    Exam:  General exam: pt appears very pale and chronically ill, no apparent distress, lethargic but arousable.  Respiratory system: shallow breathing but no rales or rhonchi and No increased work of breathing. Cardiovascular system: S1 & S2 heard.  Gastrointestinal system: Abdomen is mildly distended, soft and mild tenderness RLQ. No guarding. Normal bowel sounds heard. Central nervous system: lethargic, arousable. No focal neurological deficits. 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  Data Reviewed: Basic Metabolic Panel:  Recent Labs Lab 12/18/16 1954 12/18/16 2007 12/18/16 2348 12/19/16 0545  NA 130* 132*  --  131*  K 5.5* 5.5* 5.2* 5.2*  5.2*  CL 99* 99*  --  101  CO2 21*  --   --  17*  GLUCOSE 88 88  --  83  BUN 63* 61*  --  63*  CREATININE 3.27* 3.30*  --  3.15*  CALCIUM 8.8*  --   --  8.4*   Liver Function Tests:  Recent Labs Lab 12/18/16 1954 12/19/16 0545  AST 49* 43*  ALT 19 17  ALKPHOS 72 63  BILITOT 2.6* 2.5*  PROT 8.1 7.5  ALBUMIN 1.9* 1.6*   No results for input(s): LIPASE, AMYLASE in the last 168 hours. No results for input(s): AMMONIA in the last 168 hours. CBC:  Recent Labs Lab 12/18/16 1954 12/18/16 2007  WBC 6.6  --   NEUTROABS 5.2  --   HGB 10.0* 10.5*  HCT 29.0* 31.0*  MCV 94.8  --   PLT 87*  --    Cardiac Enzymes: No results for input(s): CKTOTAL, CKMB, CKMBINDEX, TROPONINI in the last 168 hours. CBG (last 3)   Recent Labs  12/19/16 0038 12/19/16 0735  GLUCAP 78 79   Recent Results (from the past 240 hour(s))  MRSA PCR Screening     Status: Abnormal   Collection Time: 12/19/16  1:24 AM  Result Value Ref Range Status   MRSA by PCR  POSITIVE (A) NEGATIVE Final    Comment:        The GeneXpert MRSA Assay (FDA approved for NASAL specimens only), is one component of a comprehensive MRSA colonization surveillance program. It is not intended to diagnose MRSA infection nor to guide or monitor treatment for MRSA infections. RESULT CALLED TO, READ BACK BY AND VERIFIED WITH: C DAVIS,RN  12/19/16 MKELLY,MLT     Studies: US Renal  Result Date: 12/19/2016 CLINICAL DATA:  Acute kidney injury EXAM: RENAL / URINARY  TRACT ULTRASOUND COMPLETE COMPARISON:  None. FINDINGS: Right Kidney: Length: 11.3 cm. Increased parenchymal echogenicity consistent with medical renal disease. 2 cm cyst at the lateral midpole region. No hydronephrosis. Left Kidney: Length: 12.8 cm. Increased parenchymal echogenicity consistent with medical renal disease. 4.9 cm cyst at the lower pole. 1.4 cm calculus in the midpole collecting system. No hydronephrosis. Bladder: The urinary bladder is empty. Moderate volume peritoneal ascites. IMPRESSION: 1. Increased renal parenchymal echogenicity consistent with medical renal disease. No hydronephrosis. 2. Left midpole calcification may represent a 14 mm collecting system calculus. No obstructing calculi. 3. Peritoneal ascites. Electronically Signed   By: Ellery Plunk M.D.   On: 12/19/2016 03:37   Dg Chest Portable 1 View  Result Date: 12/18/2016 CLINICAL DATA:  Dyspnea are EXAM: PORTABLE CHEST 1 VIEW COMPARISON:  None. FINDINGS: Shallow lung inflation. The heart size and mediastinal contours are within normal limits. Both lungs are clear. The visualized skeletal structures are unremarkable. IMPRESSION: No active disease. Electronically Signed   By: Deatra Robinson M.D.   On: 12/18/2016 19:48   Scheduled Meds: . atorvastatin  40 mg Oral Daily  . brimonidine  1 drop Both Eyes Daily  . feeding supplement (ENSURE ENLIVE)  237 mL Oral BID BM  . feeding supplement (PRO-STAT SUGAR FREE 64)  30 mL Oral TID  .  ipratropium-albuterol  3 mL Nebulization QID  . multivitamin with minerals  1 tablet Oral Daily  . nadolol  20 mg Oral Daily  . pantoprazole  40 mg Oral BID  . saccharomyces boulardii  250 mg Oral BID  . sodium chloride flush  3 mL Intravenous Q12H  . sodium polystyrene  30 g Oral Once  . sucralfate  1 g Oral TID AC & HS  . [START ON 12/21/2016] vancomycin  1,500 mg Intravenous Q48H   Continuous Infusions: .  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

## 2016-12-19 NOTE — Progress Notes (Signed)
New Admission Note:  Arrival Method: By bed from the ED around 0100, came from SNF  Mental Orientation: Alert and oriented x3 Telemetry: Box 14, CCMD notified Assessment: Completed Skin: Completed, refer to flowsheets IV: Left upper arm Pain: Denies Tubes: None Safety Measures: Safety Fall Prevention Plan was given, discussed  Admission: Completed [address] Orientation: Patient has been orientated to the room, unit and the staff. Family: None  Orders have been reviewed and implemented. Will continue to monitor the patient. Call light has been placed within reach and bed alarm has been activated.   Alfonse Ras, RN  Phone Number: [phone]

## 2016-12-20 ENCOUNTER — Inpatient Hospital Stay (HOSPITAL_COMMUNITY): Payer: BC Managed Care – PPO

## 2016-12-20 ENCOUNTER — Encounter (HOSPITAL_COMMUNITY): Payer: Self-pay | Admitting: Radiology

## 2016-12-20 DIAGNOSIS — K7031 Alcoholic cirrhosis of liver with ascites: Secondary | ICD-10-CM

## 2016-12-20 HISTORY — PX: IR PARACENTESIS: IMG2679

## 2016-12-20 LAB — RENAL FUNCTION PANEL
ALBUMIN: 1.6 g/dL — AB (ref 3.5–5.0)
ANION GAP: 8 (ref 5–15)
Albumin: 1.6 g/dL — ABNORMAL LOW (ref 3.5–5.0)
Anion gap: 7 (ref 5–15)
BUN: 68 mg/dL — AB (ref 6–20)
BUN: 67 mg/dL — ABNORMAL HIGH (ref 6–20)
CALCIUM: 8.2 mg/dL — AB (ref 8.9–10.3)
CO2: 23 mmol/L (ref 22–32)
CO2: 22 mmol/L (ref 22–32)
CREATININE: 3.31 mg/dL — AB (ref 0.61–1.24)
Calcium: 8.3 mg/dL — ABNORMAL LOW (ref 8.9–10.3)
Chloride: 102 mmol/L (ref 101–111)
Chloride: 104 mmol/L (ref 101–111)
Creatinine, Ser: 3.33 mg/dL — ABNORMAL HIGH (ref 0.61–1.24)
GFR calc Af Amer: 21 mL/min — ABNORMAL LOW (ref >60)
GFR calc Af Amer: 21 mL/min — ABNORMAL LOW (ref >60)
GFR calc non Af Amer: 18 mL/min — ABNORMAL LOW (ref >60)
GFR calc non Af Amer: 18 mL/min — ABNORMAL LOW (ref >60)
GLUCOSE: 134 mg/dL — AB (ref 65–99)
GLUCOSE: 172 mg/dL — AB (ref 65–99)
PHOSPHORUS: 3.8 mg/dL (ref 2.5–4.6)
POTASSIUM: 4 mmol/L (ref 3.5–5.1)
Phosphorus: 3.8 mg/dL (ref 2.5–4.6)
Potassium: 4.2 mmol/L (ref 3.5–5.1)
Sodium: 133 mmol/L — ABNORMAL LOW (ref 135–145)
Sodium: 133 mmol/L — ABNORMAL LOW (ref 135–145)

## 2016-12-20 LAB — DIFFERENTIAL
Basophils Absolute: 0 10*3/uL (ref 0.0–0.1)
Basophils Relative: 0 %
EOS ABS: 0.2 10*3/uL (ref 0.0–0.7)
EOS PCT: 3 %
LYMPHS ABS: 0.7 10*3/uL (ref 0.7–4.0)
Lymphocytes Relative: 8 %
MONOS PCT: 12 %
Monocytes Absolute: 1 10*3/uL (ref 0.1–1.0)
NEUTROS PCT: 77 %
Neutro Abs: 6.4 10*3/uL (ref 1.7–7.7)

## 2016-12-20 LAB — GRAM STAIN

## 2016-12-20 LAB — CBC
HCT: 24.1 % — ABNORMAL LOW (ref 39.0–52.0)
HCT: 24.7 % — ABNORMAL LOW (ref 39.0–52.0)
HEMOGLOBIN: 8.2 g/dL — AB (ref 13.0–17.0)
HEMOGLOBIN: 8.3 g/dL — AB (ref 13.0–17.0)
MCH: 32.5 pg (ref 26.0–34.0)
MCH: 32.4 pg (ref 26.0–34.0)
MCHC: 34 g/dL (ref 30.0–36.0)
MCHC: 33.6 g/dL (ref 30.0–36.0)
MCV: 95.6 fL (ref 78.0–100.0)
MCV: 96.5 fL (ref 78.0–100.0)
PLATELETS: 57 10*3/uL — AB (ref 150–400)
Platelets: 58 10*3/uL — ABNORMAL LOW (ref 150–400)
RBC: 2.52 MIL/uL — ABNORMAL LOW (ref 4.22–5.81)
RBC: 2.56 MIL/uL — ABNORMAL LOW (ref 4.22–5.81)
RDW: 20 % — AB (ref 11.5–15.5)
RDW: 19.7 % — ABNORMAL HIGH (ref 11.5–15.5)
WBC: 5.3 10*3/uL (ref 4.0–10.5)
WBC: 5.4 10*3/uL (ref 4.0–10.5)

## 2016-12-20 LAB — URIC ACID: Uric Acid, Serum: 9.9 mg/dL — ABNORMAL HIGH (ref 4.4–7.6)

## 2016-12-20 LAB — BODY FLUID CELL COUNT WITH DIFFERENTIAL
Eos, Fluid: 0 %
Lymphs, Fluid: 17 %
Monocyte-Macrophage-Serous Fluid: 64 % (ref 50–90)
Neutrophil Count, Fluid: 15 % (ref 0–25)
OTHER CELLS FL: 0 %
WBC FLUID: 142 uL (ref 0–1000)

## 2016-12-20 LAB — UREA NITROGEN, URINE: UREA NITROGEN UR: 731 mg/dL

## 2016-12-20 LAB — GLUCOSE, CAPILLARY: GLUCOSE-CAPILLARY: 181 mg/dL — AB (ref 65–99)

## 2016-12-20 LAB — AMMONIA: Ammonia: 27 umol/L (ref 9–35)

## 2016-12-20 MED ORDER — LACTULOSE 10 GM/15ML PO SOLN
10.0000 g | Freq: Every day | ORAL | Status: DC
  Administered 2016-12-21 – 2016-12-23 (×3): 10 g via ORAL
  Filled 2016-12-20 (×4): qty 15

## 2016-12-20 MED ORDER — LIDOCAINE HCL 1 % IJ SOLN
INTRAMUSCULAR | Status: AC
  Filled 2016-12-20: qty 20

## 2016-12-20 MED ORDER — SODIUM CHLORIDE 0.9 % IV SOLN
50.0000 ug/h | INTRAVENOUS | Status: DC
  Administered 2016-12-20 – 2016-12-22 (×4): 50 ug/h via INTRAVENOUS
  Filled 2016-12-20 (×15): qty 1

## 2016-12-20 MED ORDER — SODIUM CHLORIDE 0.9 % IV SOLN
INTRAVENOUS | Status: DC
  Administered 2016-12-20: 18:00:00 via INTRAVENOUS
  Administered 2016-12-22: 1000 mL via INTRAVENOUS

## 2016-12-20 MED ORDER — ALBUMIN HUMAN 25 % IV SOLN
25.0000 g | Freq: Three times a day (TID) | INTRAVENOUS | Status: DC
  Administered 2016-12-20 – 2016-12-23 (×9): 25 g via INTRAVENOUS
  Filled 2016-12-20 (×10): qty 100

## 2016-12-20 MED ORDER — MIDODRINE HCL 5 MG PO TABS
7.5000 mg | ORAL_TABLET | Freq: Three times a day (TID) | ORAL | Status: DC
  Administered 2016-12-20: 7.5 mg via ORAL
  Filled 2016-12-20: qty 2

## 2016-12-20 MED ORDER — RIFAXIMIN 550 MG PO TABS
550.0000 mg | ORAL_TABLET | Freq: Two times a day (BID) | ORAL | Status: DC
  Administered 2016-12-20 – 2016-12-27 (×14): 550 mg via ORAL
  Filled 2016-12-20 (×14): qty 1

## 2016-12-20 MED ORDER — MIDODRINE HCL 5 MG PO TABS
10.0000 mg | ORAL_TABLET | Freq: Three times a day (TID) | ORAL | Status: DC
  Administered 2016-12-20 – 2016-12-21 (×2): 10 mg via ORAL
  Filled 2016-12-20 (×2): qty 2

## 2016-12-20 MED ORDER — ALBUMIN HUMAN 25 % IV SOLN
100.0000 g | Freq: Every day | INTRAVENOUS | Status: DC
  Filled 2016-12-20 (×3): qty 400

## 2016-12-20 NOTE — Consult Note (Signed)
Reason for Consult:AKI  Referring Physician: Dr. Hadley Pen Jeffery is an 66 y.o. [name].  HPI: 83yrmale with NASH, cirrhosis with multiple complications admitted with N, V, weakness from NH.  [facility] In Feb had ray amp on that side.  Hx R AKA.  Long hx DM, retinal dz, DJD, neuropathy. Baseline Cr 1.3-1-6.  In Feb had Cr ^ to 2.6 after large vol paracentesis and D. Hosp for that and Cr went back to baseline.  He is incoherent now and cannot give hx. Review of systems not obtained due to patient factors.   Past Medical History:  Diagnosis Date  . Arthritis   . Asthma   . Bilateral cataracts   . Blurred vision    improved now  . Cholelithiasis   . Diabetes mellitus    type II  . Eczema    inner wrist  . Hypertension   . Muscle cramps   . [place]   . Neuropathy [place]   . Rash    wrist and hands since late june  . Retinal degeneration     Past Surgical History:  Procedure Laterality Date  . ABOVE KNEE LEG AMPUTATION     right  . APPLICATION OF WOUND VAC Left 11/06/2016   Procedure: APPLICATION OF WOUND VAC;  Surgeon: MMarybelle Killings MD;  [facility]  Service: Orthopedics;  Laterality: Left;  . COLONOSCOPY WITH PROPOFOL N/A 05/18/2016   Procedure: COLONOSCOPY WITH PROPOFOL;  Surgeon: DMilus Banister MD;  [facility];  Service: Endoscopy;  Laterality: N/A;  . ESOPHAGOGASTRODUODENOSCOPY (EGD) WITH PROPOFOL N/A 05/18/2016   Procedure: ESOPHAGOGASTRODUODENOSCOPY (EGD) WITH PROPOFOL;  Surgeon: DMilus Banister MD;  [facility];  Service: Endoscopy;  Laterality: N/A;  . ESOPHAGOGASTRODUODENOSCOPY (EGD) WITH PROPOFOL N/A 11/01/2016   Procedure: ESOPHAGOGASTRODUODENOSCOPY (EGD) WITH PROPOFOL;  Surgeon: KMauri Pole MD;  [facility];  Service: Endoscopy;  Laterality: N/A;  . EYE SURGERY  09/2011   bilateral for retinopathy - laser eye surgery  . I&D EXTREMITY Left 10/25/2016    Procedure: LEFT FOOT DEBRIDEMENT RAY AMPUTATION PLACEMENT OF WOUND VAC;  Surgeon: MMarybelle Killings MD;  [place] [facility]  Service: Orthopedics;  Laterality: Left;  . I&D EXTREMITY Left 11/06/2016   Procedure: IRRIGATION AND DEBRIDEMENT EXTREMITY/LEFT FOOT;  Surgeon: MMarybelle Killings MD;  [place] [facility]  Service: Orthopedics;  Laterality: Left;  . IR PARACENTESIS  12/20/2016  . left metatarsal  2003   5th  . TOE AMPUTATION     Left big toe    Family History  Problem Relation Age of Onset  . Cancer Mother     unknown type    Social History:  reports that he has never smoked. He has never used smokeless tobacco. He reports that he does not drink alcohol or use drugs.  Allergies: No Known Allergies  Medications:  I have reviewed the patient's current medications. Prior to Admission:  Prescriptions Prior to Admission  Medication Sig Dispense Refill Last Dose  . atorvastatin (LIPITOR) 40 MG tablet Take 40 mg by mouth daily.  2 12/18/2016 at 2100  . brimonidine (ALPHAGAN P) 0.1 % SOLN Place 1 drop into both eyes daily.   12/18/2016 at 2100  . furosemide (LASIX) 40 MG tablet Take 40 mg by mouth daily.   12/18/2016 at 0900  . insulin aspart (NOVOLOG FLEXPEN) 100 UNIT/ML FlexPen Inject before meals and at bedtime per sliding scale: if  BGL is: 200 - 250 = 2 units; 251 - 300 = 4 units; 301 - 350 = 6 units; 351 - 400 = 8 units, if greater than 400 notify MD   12/18/2016 at 2100  . loperamide (IMODIUM) 2 MG capsule Take 1 capsule (2 mg total) by mouth as needed for diarrhea or loose stools. 30 capsule 0 PRN at PRN  . methocarbamol (ROBAXIN) 500 MG tablet Take 0.5 tablets (250 mg total) by mouth every 8 (eight) hours as needed for muscle spasms. 15 tablet 0 PRN at PRN  . metoCLOPramide (REGLAN) 5 MG tablet Take 1 tablet (5 mg total) by mouth every 6 (six) hours as needed for nausea. 20 tablet 0 PRN at PRN  . Multiple Vitamin (MULTIVITAMIN WITH MINERALS) TABS tablet Take 1 tablet by mouth daily.   12/18/2016 at  0900  . nadolol (CORGARD) 20 MG tablet Take 1 tablet (20 mg total) by mouth daily. 30 tablet 0 12/18/2016 at 0900  . oxyCODONE (ROXICODONE) 5 MG immediate release tablet Take one tablet by mouth every 4 hours as needed for pain (Patient taking differently: Take 5 mg by mouth every 4 (four) hours as needed for severe pain. ) 180 tablet 0 PRN at PRN  . pantoprazole (PROTONIX) 40 MG tablet Take 1 tablet (40 mg total) by mouth 2 (two) times daily. 60 tablet 0 12/18/2016 at 1600  . saccharomyces boulardii (FLORASTOR) 250 MG capsule Take 1 capsule (250 mg total) by mouth 2 (two) times daily. 10 capsule 0 12/18/2016 at 1800  . spironolactone (ALDACTONE) 100 MG tablet Take 100 mg by mouth daily.    12/18/2016 at 0900  . sucralfate (CARAFATE) 1 GM/10ML suspension Take 10 mLs (1 g total) by mouth 4 (four) times daily -  with meals and at bedtime. (Patient taking differently: Take 1 g by mouth 4 (four) times daily -  before meals and at bedtime. ) 420 mL 0 12/18/2016 at 2100  . sulfamethoxazole-trimethoprim (BACTRIM DS,SEPTRA DS) 800-160 MG tablet Take 1 tablet by mouth 2 (two) times daily. FOR 10 DAYS (Start date: 12/12/16)   12/18/2016 at 1800  . UNABLE TO FIND MedPass: Drink 237 ml's by mouth three times a day   12/18/2016 at 1800  . UNABLE TO FIND Protein Liquid: Drink 30 ml's by mouth three times a day for wound healing   12/18/2016 at 1800  . vitamin C (ASCORBIC ACID) 500 MG tablet Take 500 mg by mouth 2 (two) times daily. FOR 45 DAYS (Start date: 11/09/16)   12/18/2016 at 1800  . Zinc Sulfate (ZINC-220 PO) Take 220 mg by mouth daily.    12/18/2016 at 0900  . Amino Acids-Protein Hydrolys (FEEDING SUPPLEMENT, PRO-STAT SUGAR FREE 64,) LIQD Take 30 mLs by mouth 3 (three) times daily. (Patient not taking: Reported on 12/18/2016) 900 mL 0 Not Taking at Unknown time  . BISMUTH TRIBROMOPH-PETROLATUM EX Apply to right foot topically every Tuesday, Thursday and Saturday   Not Taking at Unknown time  . collagenase (SANTYL) ointment Apply  to right heel topically on Tuesday, Thursday and Saturday   Not Taking at Unknown time  . ipratropium-albuterol (DUONEB) 0.5-2.5 (3) MG/3ML SOLN    Not Taking at Unknown time  . wound dressings gel Apply topically as needed for wound care. TheraHoney Gel - Apply to left heel and left plantar foot topically every day   Not Taking at Unknown time    Results for orders placed or performed during the hospital encounter of 12/18/16 (from the  past 48 hour(s))  CBC with Differential     Status: Abnormal   Collection Time: 12/18/16  7:54 PM  Result Value Ref Range   WBC 6.6 4.0 - 10.5 K/uL   RBC 3.06 (L) 4.22 - 5.81 MIL/uL   Hemoglobin 10.0 (L) 13.0 - 17.0 g/dL   HCT 29.0 (L) 39.0 - 52.0 %   MCV 94.8 78.0 - 100.0 fL   MCH 32.7 26.0 - 34.0 pg   MCHC 34.5 30.0 - 36.0 g/dL   RDW 19.9 (H) 11.5 - 15.5 %   Platelets 87 (L) 150 - 400 K/uL    Comment: CONSISTENT WITH PREVIOUS RESULT   Neutrophils Relative % 79 %   Neutro Abs 5.2 1.7 - 7.7 K/uL   Lymphocytes Relative 9 %   Lymphs Abs 0.6 (L) 0.7 - 4.0 K/uL   Monocytes Relative 7 %   Monocytes Absolute 0.5 0.1 - 1.0 K/uL   Eosinophils Relative 5 %   Eosinophils Absolute 0.3 0.0 - 0.7 K/uL   Basophils Relative 0 %   Basophils Absolute 0.0 0.0 - 0.1 K/uL  Comprehensive metabolic panel     Status: Abnormal   Collection Time: 12/18/16  7:54 PM  Result Value Ref Range   Sodium 130 (L) 135 - 145 mmol/L   Potassium 5.5 (H) 3.5 - 5.1 mmol/L   Chloride 99 (L) 101 - 111 mmol/L   CO2 21 (L) 22 - 32 mmol/L   Glucose, Bld 88 65 - 99 mg/dL   BUN 63 (H) 6 - 20 mg/dL   Creatinine, Ser 3.27 (H) 0.61 - 1.24 mg/dL   Calcium 8.8 (L) 8.9 - 10.3 mg/dL   Total Protein 8.1 6.5 - 8.1 g/dL   Albumin 1.9 (L) 3.5 - 5.0 g/dL   AST 49 (H) 15 - 41 U/L   ALT 19 17 - 63 U/L   Alkaline Phosphatase 72 38 - 126 U/L   Total Bilirubin 2.6 (H) 0.3 - 1.2 mg/dL   GFR calc non Af Amer 18 (L) >60 mL/min   GFR calc Af Amer 21 (L) >60 mL/min    Comment: (NOTE) The eGFR has been  calculated using the CKD EPI equation. This calculation has not been validated in all clinical situations. eGFR's persistently <60 mL/min signify possible Chronic Kidney Disease.    Anion gap 10 5 - 15  Brain natriuretic peptide     Status: Abnormal   Collection Time: 12/18/16  7:55 PM  Result Value Ref Range   B Natriuretic Peptide 213.0 (H) 0.0 - 100.0 pg/mL  I-Stat Troponin, ED (not at [facility])     Status: None   Collection Time: 12/18/16  8:06 PM  Result Value Ref Range   Troponin i, poc 0.01 0.00 - 0.08 ng/mL   Comment 3            Comment: Due to the release kinetics of cTnI, a negative result within the first hours of the onset of symptoms does not rule out myocardial infarction with certainty. If myocardial infarction is still suspected, repeat the test at appropriate intervals.   I-Stat Chem 8, ED     Status: Abnormal   Collection Time: 12/18/16  8:07 PM  Result Value Ref Range   Sodium 132 (L) 135 - 145 mmol/L   Potassium 5.5 (H) 3.5 - 5.1 mmol/L   Chloride 99 (L) 101 - 111 mmol/L   BUN 61 (H) 6 - 20 mg/dL   Creatinine, Ser 3.30 (H) 0.61 - 1.24 mg/dL  Glucose, Bld 88 65 - 99 mg/dL   Calcium, Ion 1.12 (L) 1.15 - 1.40 mmol/L   TCO2 22 0 - 100 mmol/L   Hemoglobin 10.5 (L) 13.0 - 17.0 g/dL   HCT 31.0 (L) 39.0 - 52.0 %  Urinalysis, Routine w reflex microscopic     Status: Abnormal   Collection Time: 12/18/16 10:00 PM  Result Value Ref Range   Color, Urine YELLOW YELLOW   APPearance HAZY (A) CLEAR   Specific Gravity, Urine 1.015 1.005 - 1.030   pH 5.0 5.0 - 8.0   Glucose, UA NEGATIVE NEGATIVE mg/dL   Hgb urine dipstick SMALL (A) NEGATIVE   Bilirubin Urine NEGATIVE NEGATIVE   Ketones, ur NEGATIVE NEGATIVE mg/dL   Protein, ur 30 (A) NEGATIVE mg/dL   Nitrite NEGATIVE NEGATIVE   Leukocytes, UA NEGATIVE NEGATIVE   RBC / HPF 6-30 0 - 5 RBC/hpf   WBC, UA 0-5 0 - 5 WBC/hpf   Bacteria, UA RARE (A) NONE SEEN   Squamous Epithelial / LPF 0-5 (A) NONE SEEN   Mucous PRESENT     Hyaline Casts, UA PRESENT   Creatinine, urine, random     Status: None   Collection Time: 12/18/16 10:00 PM  Result Value Ref Range   Creatinine, Urine 128.85 mg/dL  Urea nitrogen, urine     Status: None   Collection Time: 12/18/16 10:00 PM  Result Value Ref Range   Urea Nitrogen, Ur 731 Not Estab. mg/dL    Comment: (NOTE) Performed At: [facility] [place], [place] [id number] Lindon Romp MD NW:[id number]   Na and K (sodium & potassium), rand urine     Status: None   Collection Time: 12/18/16 10:00 PM  Result Value Ref Range   Sodium, Ur 40 mmol/L   Potassium Urine 39 mmol/L  Potassium     Status: Abnormal   Collection Time: 12/18/16 11:48 PM  Result Value Ref Range   Potassium 5.2 (H) 3.5 - 5.1 mmol/L  Protime-INR     Status: Abnormal   Collection Time: 12/18/16 11:48 PM  Result Value Ref Range   Prothrombin Time 26.1 (H) 11.4 - 15.2 seconds   INR 2.35   C-reactive protein     Status: Abnormal   Collection Time: 12/18/16 11:48 PM  Result Value Ref Range   CRP 6.6 (H) <1.0 mg/dL  Sedimentation rate     Status: Abnormal   Collection Time: 12/18/16 11:48 PM  Result Value Ref Range   Sed Rate 91 (H) 0 - 16 mm/hr  CBG monitoring, ED     Status: None   Collection Time: 12/19/16 12:38 AM  Result Value Ref Range   Glucose-Capillary 78 65 - 99 mg/dL  MRSA PCR Screening     Status: Abnormal   Collection Time: 12/19/16  1:24 AM  Result Value Ref Range   MRSA by PCR POSITIVE (A) NEGATIVE    Comment:        The GeneXpert MRSA Assay (FDA approved for NASAL specimens only), is one component of a comprehensive MRSA colonization surveillance program. It is not intended to diagnose MRSA infection nor to guide or monitor treatment for MRSA infections. RESULT CALLED TO, READ BACK BY AND VERIFIED WITH: C DAVIS,RN @0505  12/19/16 MKELLY,MLT   Potassium     Status: Abnormal   Collection Time: 12/19/16  5:45 AM  Result Value Ref Range   Potassium 5.2  (H) 3.5 - 5.1 mmol/L  Comprehensive metabolic panel     Status: Abnormal  Collection Time: 12/19/16  5:45 AM  Result Value Ref Range   Sodium 131 (L) 135 - 145 mmol/L   Potassium 5.2 (H) 3.5 - 5.1 mmol/L   Chloride 101 101 - 111 mmol/L   CO2 17 (L) 22 - 32 mmol/L   Glucose, Bld 83 65 - 99 mg/dL   BUN 63 (H) 6 - 20 mg/dL   Creatinine, Ser 3.15 (H) 0.61 - 1.24 mg/dL   Calcium 8.4 (L) 8.9 - 10.3 mg/dL   Total Protein 7.5 6.5 - 8.1 g/dL   Albumin 1.6 (L) 3.5 - 5.0 g/dL   AST 43 (H) 15 - 41 U/L   ALT 17 17 - 63 U/L   Alkaline Phosphatase 63 38 - 126 U/L   Total Bilirubin 2.5 (H) 0.3 - 1.2 mg/dL   GFR calc non Af Amer 19 (L) >60 mL/min   GFR calc Af Amer 22 (L) >60 mL/min    Comment: (NOTE) The eGFR has been calculated using the CKD EPI equation. This calculation has not been validated in all clinical situations. eGFR's persistently <60 mL/min signify possible Chronic Kidney Disease.    Anion gap 13 5 - 15  Glucose, capillary     Status: None   Collection Time: 12/19/16  7:35 AM  Result Value Ref Range   Glucose-Capillary 79 65 - 99 mg/dL  Glucose, capillary     Status: None   Collection Time: 12/19/16  5:48 PM  Result Value Ref Range   Glucose-Capillary 81 65 - 99 mg/dL  Renal function panel     Status: Abnormal   Collection Time: 12/20/16  7:26 AM  Result Value Ref Range   Sodium 133 (L) 135 - 145 mmol/L   Potassium 4.0 3.5 - 5.1 mmol/L   Chloride 102 101 - 111 mmol/L   CO2 23 22 - 32 mmol/L   Glucose, Bld 134 (H) 65 - 99 mg/dL   BUN 68 (H) 6 - 20 mg/dL   Creatinine, Ser 3.33 (H) 0.61 - 1.24 mg/dL   Calcium 8.3 (L) 8.9 - 10.3 mg/dL   Phosphorus 3.8 2.5 - 4.6 mg/dL   Albumin 1.6 (L) 3.5 - 5.0 g/dL   GFR calc non Af Amer 18 (L) >60 mL/min   GFR calc Af Amer 21 (L) >60 mL/min    Comment: (NOTE) The eGFR has been calculated using the CKD EPI equation. This calculation has not been validated in all clinical situations. eGFR's persistently <60 mL/min signify possible  Chronic Kidney Disease.    Anion gap 8 5 - 15  CBC     Status: Abnormal   Collection Time: 12/20/16  7:26 AM  Result Value Ref Range   WBC 5.3 4.0 - 10.5 K/uL   RBC 2.52 (L) 4.22 - 5.81 MIL/uL   Hemoglobin 8.2 (L) 13.0 - 17.0 g/dL    Comment: REPEATED TO VERIFY   HCT 24.1 (L) 39.0 - 52.0 %   MCV 95.6 78.0 - 100.0 fL   MCH 32.5 26.0 - 34.0 pg   MCHC 34.0 30.0 - 36.0 g/dL   RDW 20.0 (H) 11.5 - 15.5 %   Platelets 58 (L) 150 - 400 K/uL    Comment: REPEATED TO VERIFY CONSISTENT WITH PREVIOUS RESULT   Ammonia     Status: None   Collection Time: 12/20/16  7:26 AM  Result Value Ref Range   Ammonia 27 9 - 35 umol/L  Renal function panel     Status: Abnormal   Collection Time: 12/20/16  9:25  AM  Result Value Ref Range   Sodium 133 (L) 135 - 145 mmol/L   Potassium 4.2 3.5 - 5.1 mmol/L   Chloride 104 101 - 111 mmol/L   CO2 22 22 - 32 mmol/L   Glucose, Bld 172 (H) 65 - 99 mg/dL   BUN 67 (H) 6 - 20 mg/dL   Creatinine, Ser 3.31 (H) 0.61 - 1.24 mg/dL   Calcium 8.2 (L) 8.9 - 10.3 mg/dL   Phosphorus 3.8 2.5 - 4.6 mg/dL   Albumin 1.6 (L) 3.5 - 5.0 g/dL   GFR calc non Af Amer 18 (L) >60 mL/min   GFR calc Af Amer 21 (L) >60 mL/min    Comment: (NOTE) The eGFR has been calculated using the CKD EPI equation. This calculation has not been validated in all clinical situations. eGFR's persistently <60 mL/min signify possible Chronic Kidney Disease.    Anion gap 7 5 - 15  CBC     Status: Abnormal   Collection Time: 12/20/16  9:25 AM  Result Value Ref Range   WBC 5.4 4.0 - 10.5 K/uL   RBC 2.56 (L) 4.22 - 5.81 MIL/uL   Hemoglobin 8.3 (L) 13.0 - 17.0 g/dL   HCT 24.7 (L) 39.0 - 52.0 %   MCV 96.5 78.0 - 100.0 fL   MCH 32.4 26.0 - 34.0 pg   MCHC 33.6 30.0 - 36.0 g/dL   RDW 19.7 (H) 11.5 - 15.5 %   Platelets 57 (L) 150 - 400 K/uL    Comment: CONSISTENT WITH PREVIOUS RESULT  Glucose, capillary     Status: Abnormal   Collection Time: 12/20/16  9:42 AM  Result Value Ref Range    Glucose-Capillary 181 (H) 65 - 99 mg/dL   Comment 1 Notify RN    Comment 2 Document in Chart     US Renal  Result Date: 12/19/2016 CLINICAL DATA:  Acute kidney injury EXAM: RENAL / URINARY TRACT ULTRASOUND COMPLETE COMPARISON:  None. FINDINGS: Right Kidney: Length: 11.3 cm. Increased parenchymal echogenicity consistent with medical renal disease. 2 cm cyst at the lateral midpole region. No hydronephrosis. Left Kidney: Length: 12.8 cm. Increased parenchymal echogenicity consistent with medical renal disease. 4.9 cm cyst at the lower pole. 1.4 cm calculus in the midpole collecting system. No hydronephrosis. Bladder: The urinary bladder is empty. Moderate volume peritoneal ascites. IMPRESSION: 1. Increased renal parenchymal echogenicity consistent with medical renal disease. No hydronephrosis. 2. Left midpole calcification may represent a 14 mm collecting system calculus. No obstructing calculi. 3. Peritoneal ascites. Electronically Signed   By: Andreas Newport M.D.   On: 12/19/2016 03:37   Dg Chest Portable 1 View  Result Date: 12/18/2016 CLINICAL DATA:  Dyspnea are EXAM: PORTABLE CHEST 1 VIEW COMPARISON:  None. FINDINGS: Shallow lung inflation. The heart size and mediastinal contours are within normal limits. Both lungs are clear. The visualized skeletal structures are unremarkable. IMPRESSION: No active disease. Electronically Signed   By: Ulyses Jarred M.D.   On: 12/18/2016 19:48   Dg Abd Portable 1v  Result Date: 12/19/2016 CLINICAL DATA:  Abdominal pain today. EXAM: PORTABLE ABDOMEN - 1 VIEW COMPARISON:  Plain films the abdomen 10/30/2016. CT abdomen and pelvis 10/23/2016. FINDINGS: The bowel gas pattern is normal. No radio-opaque calculi or other significant radiographic abnormality are seen. IMPRESSION: Negative exam. Electronically Signed   By: Inge Rise M.D.   On: 12/19/2016 12:35   Ir Paracentesis  Result Date: 12/20/2016 INDICATION: Recurrent ascites EXAM: ULTRASOUND-GUIDED  PARACENTESIS COMPARISON:  Previous paracentesis MEDICATIONS: 10  cc 1% lidocaine COMPLICATIONS: None immediate. TECHNIQUE: Informed written consent was obtained from the patient after a discussion of the risks, benefits and alternatives to treatment. A timeout was performed prior to the initiation of the procedure. Initial ultrasound scanning demonstrates a large amount of ascites within the left lower abdominal quadrant. The left lower abdomen was prepped and draped in the usual sterile fashion. 1% lidocaine with epinephrine was used for local anesthesia. Under direct ultrasound guidance, a 19 gauge, 7-cm, Yueh catheter was introduced. An ultrasound image was saved for documentation purposed. The paracentesis was performed. The catheter was removed and a dressing was applied. The patient tolerated the procedure well without immediate post procedural complication. FINDINGS: A total of approximately 50 cc only of yellow fluid was removed. Samples were sent to the laboratory as requested by the clinical team. IMPRESSION: Successful ultrasound-guided paracentesis yielding 50 cc only of peritoneal fluid. Diagnostic only per MD. 50 cc only. Read by Lavonia Drafts [facility] Electronically Signed   By: Jerilynn Mages.  Shick M.D.   On: 12/20/2016 15:14    ROS Blood pressure (!) 103/59, pulse 79, temperature 98.7 F (37.1 C), temperature source Oral, resp. rate 16, height 6' 1"  (1.854 m), weight 103 kg (227 lb), SpO2 99 %. Physical Exam Physical Examination: General appearance - not coherent, pale chronically ill Mental status - Ox1, confused, falls asleep after 1-2 words Eyes - DM retinal dz Mouth - reddened, dry Neck - adenopathy noted PCL Lymphatics - posterior cervical nodes Chest - decreased bs, expir wheezes soft, scattered rhonchi Heart - S1 and S2 normal Gr 2/6 holosys M Abdomen - mod tender, distended, pos FW Neurological - as  Above, only moves to stim, all extrem, sym facies Musculoskeletal - no joint tenderness,  deformity or swelling, RAKA Extremities - pedal edema 1-2 +, RAKA, 1+ DP on R, dressing on L foot. L ray amp Skin - pale, scaling,dry  Assessment/Plan: 1 AKI/CKD suspect this is not just HRS but vol and toxic with Septra (inhib Cr excretion and ? AIN).  Needs to be treated as HRS,  . BP low stop nadolol esp since is renal excretion .  Needs tid alb, 47m Mido, octredotide , avoid LV paracentesis, r/o SBP.  ? Still dry.  FENA is low but not typical pattern of HRS 2 CKD 3 DM 3 Cirrhosis many complications and is end of the line 4. Anemia  5. Thrombocytopenia 6 DM P mido tid, alb tid , consider NE,  Vol expand, stop corgard, repeat urine chem, UA, urine eos, diff  Cydney Alvarenga L 12/20/2016, 3:49 PM

## 2016-12-20 NOTE — Procedures (Signed)
   US guided LLQ paracentesis  50 cc only per MD Diagnostic tests only  Tolerated well

## 2016-12-20 NOTE — Evaluation (Signed)
Physical Therapy Evaluation Patient Details Name: Jeffery [name] MRN: [id number] DOB: [birth year] Today's Date: 12/20/2016   History of Present Illness  Pt is a 66 y/o male admitted from SNF secondary to nausea, vomitting and generalized weakness. Pt found to have an AKI superimposed on CKD stage III. 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  Clinical Impression  Pt presented supine in bed with HOB elevated, awake and willing to participate in therapy session. Upon arrival, pt found to have soiled himself. Pt required max A x2 to roll bilaterally for peri care. Pt very fatigued after this, with increased WOB and dyspnea. Pt with SPO2 in mid to high 90's on RA. Pt's RN was notified. Pt would continue to benefit from skilled physical therapy services at this time while admitted and after d/c to address the below listed limitations in order to improve overall safety and independence with functional mobility.     Follow Up Recommendations SNF;Supervision/Assistance - 24 hour    Equipment Recommendations  None recommended by PT    Recommendations for Other Services       Precautions / Restrictions Precautions Precautions: Fall Restrictions Weight Bearing Restrictions: No      Mobility  Bed Mobility Overal bed mobility: Needs Assistance Bed Mobility: Rolling Rolling: Max assist;+2 for physical assistance         General bed mobility comments: max A x2 to roll bilaterally multiple times for peri care as pt was sitting in stool upon arrival  Transfers                 General transfer comment: pt very fatigued after rolling with increased dyspnea and WOB.   Ambulation/Gait                Stairs            Wheelchair Mobility    Modified Rankin (Stroke Patients Only)       Balance                                              Pertinent Vitals/Pain Pain Assessment: Faces Faces Pain Scale: Hurts whole lot Pain Location: L foot, stomach Pain Descriptors / Indicators: Sore;Grimacing;Guarding Pain Intervention(s): Monitored during session;Repositioned    Home Living Family/patient expects to be discharged to:: Skilled nursing facility                      Prior Function Level of Independence: Needs assistance   Gait / Transfers Assistance Needed: pt reported that he does not stand or ambulate   ADL's / Homemaking Assistance Needed: dependent        Hand Dominance        Extremity/Trunk Assessment   Upper Extremity Assessment Upper Extremity Assessment: Generalized weakness    Lower Extremity Assessment Lower Extremity Assessment: Generalized weakness       Communication   Communication: No difficulties  Cognition Arousal/Alertness: Awake/alert Behavior During Therapy: Anxious (tearful) Overall Cognitive Status: Impaired/Different from baseline Area of Impairment: Memory;Following commands;Safety/judgement;Problem solving                     Memory: Decreased short-term memory Following Commands: Follows one step commands with increased time;Follows one step commands consistently Safety/Judgement: Decreased awareness of  safety;Decreased awareness of deficits   Problem Solving: Slow processing;Decreased initiation;Difficulty sequencing;Requires verbal cues;Requires tactile cues        General Comments General comments (skin integrity, edema, etc.): pt with no dressing on L foot at amputation site. pt's RN present and aware.    Exercises     Assessment/Plan    PT Assessment Patient needs continued PT services  PT Problem List Decreased strength;Decreased range of motion;Decreased activity tolerance;Decreased balance;Decreased mobility;Decreased coordination;Decreased cognition;Decreased knowledge of use of DME;Decreased safety awareness;Decreased knowledge of  precautions;Pain       PT Treatment Interventions DME instruction;Functional mobility training;Therapeutic activities;Therapeutic exercise;Balance training;Neuromuscular re-education;Cognitive remediation;Patient/family education    PT Goals (Current goals can be found in the Care Plan section)  Acute Rehab PT Goals Patient Stated Goal: to feel better PT Goal Formulation: With patient Time For Goal Achievement: 01/03/17 Potential to Achieve Goals: Fair    Frequency Min 2X/week   Barriers to discharge        Co-evaluation               End of Session   Activity Tolerance: Patient limited by pain;Patient limited by fatigue;Other (comment) (anxiety) Patient left: in bed;with call bell/phone within reach;with bed alarm set Nurse Communication: Mobility status;Need for lift equipment PT Visit Diagnosis: Other abnormalities of gait and mobility (R26.89);Pain;Muscle weakness (generalized) (M62.81) Pain - Right/Left: Left Pain - part of body: Ankle and joints of foot    Time: 1610-9604 PT Time Calculation (min) (ACUTE ONLY): 30 min   Charges:   PT Evaluation $PT Eval Moderate Complexity: 1 Procedure PT Treatments $Therapeutic Activity: 8-22 mins   PT G Codes:        Pryor, PT, DPT [phone]   Alessandra Bevels [name] 12/20/2016, 1:19 PM

## 2016-12-20 NOTE — Progress Notes (Signed)
Pharmacy Antibiotic Note  Jeffery [name] is a 66 y.o. male admitted on 12/18/2016 with weakness/ARF, left foot wound, poss osteomyelitis on Bactrim at [facility] .  Pharmacy has been consulted for Vancomycin dosing.  AoCKD, SCr currently stable at 3.3 with eCrCl <81mL/min.  Plan: Vancomycin 1500 mg IV q48h F/U renal function, MRI, clinical progression, LOT  Height:  (185.4 cm) Weight: 227 lb (103 kg) IBW/kg (Calculated) : 79.9  Temp (24hrs), Avg:98.4 F (36.9 C), Min:98.1 F (36.7 C), Max:98.7 F (37.1 C)   Recent Labs Lab 12/18/16 1954 12/18/16 2007 12/19/16 0545 12/20/16 0726 12/20/16 0925  WBC 6.6  --   --  5.3 5.4  CREATININE 3.27* 3.30* 3.15* 3.33* 3.31*    Estimated Creatinine Clearance: 27.7 mL/min (A) (by C-G formula based on SCr of 3.31 mg/dL (H)).    No Known Allergies  Vanc 4/2 >>  Outpt cx: stated to be growing Staph Aureus (no sens listed - presumed MRSA) 4/3 MRSA PCR: positive 4/4 body fluid cx: sent  Tanis Burnley D. Zayley Arras, PharmD, BCPS Clinical Pharmacist Pager: [phone] 12/20/2016 3:35 PM

## 2016-12-20 NOTE — Progress Notes (Addendum)
PROGRESS NOTE  [name]  ZOX:[id number] DOB: Nov 12, 1950 DOA: 12/18/2016 PCP: Julian Hy, MD  Brief Narrative:  Jeffery Flurchickis a 66 y.o.malewith medical history significant for end-stage liver cirrhosis, insulin-dependent diabetes mellitus, and chronic kidney disease stage III who presented from his nursing facility for evaluation of nausea, vomiting, and generalized weakness.  Patient was admitted to the hospital in February 2018 with osteomyelitis involving the left foot;he underwent first ray amputation with clean margins reported and was discharged to a skilled nursing facility. A culture reportedly grew staph and he wasplaced on Bactrim empirically on 12/12/2016. Since that time, he has been evaluated by orthopedics andan outpatient MRI was ordered but not yet performed. Since that time, he developed nausea with nonbloody nonbilious vomiting and progressive weakness.  He was found to have AKI with a serum creatinine of 3.27 up from 1.6 g/dL in March.  This is likely multifactorial due to infection, bactrim, and recent paracentesis.  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 Acute kidney injury superimposed on CKD stage III, creatinine unchanged despite 2L in ER and IVF for 2 days.  Abdomen is distended and given ESLD will treat for hepatorenal syndrome - SCr is 3.27, up from 1.6 in March 2018  -  Bactrim discontinued at admission -  D/c sodium bicarbonate fluids -  Start albumin 1g/kg daily x 2 days (adjusted by nephrology) -  Start midodrine (dose  increased by nephrology) -  Start octreotide  2. 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  - Pt was admitted in February 2018 with left foot osteo and underwent 1st ray amputation with pathology reporting clean margins  - He had been discharged to SNF with wound vac, but was switched to wet-to-dry dressings after developing vac-associated maceration  - He had a culture grow staph per report, he is MRSA positive, and he was started on Bactrim 12/12/16  - Saw ortho in clinic 3/29 and had MRI ordered, but not done yet - continue empiric vancomycin  -  Wound care assistance appreciated -  MRI is pending  3. Liver cirrhosis  - Has esophageal varices, grade I by EGD in February 2018  - Lasix and Aldactone held on admission in light of N/V with AKI  - agree with d/c nadolol  - Continue Protonix   Metabolic encephalopathy, suspect hepatic encephalopathy based on history despite normal ammonia level -  Start daily lactulose -  US guided diagnostic paracentesis:  SBP unlikely given low WBC count -  f/u body fluid culture  4. Hyperkalemia, resolved with IVF and kayexelate  5. Hyponatremia, mild and asymptomatic in setting of cirrhosis  6. Normocytic anemia, thrombocytopenia due to cirrhosis - Both indices appear to be stable with no evidence for active blood loss  - Follow periodic CBC   7. Hypotension - improved with hydration  DVT prophylaxis:  SCDs  Code Status:  DNR Family Communication:  Patient alone  Disposition Plan:  Home pending improvement in kidney function,    Consultants:   Nephrology  Wound care  Procedures:  US guided paracentesis   Antimicrobials:  Anti-infectives    Start     Dose/Rate Route Frequency Ordered Stop   12/21/16 0600  vancomycin (VANCOCIN) 1,500 mg in sodium chloride 0.9 % 500 mL IVPB     1,500 mg 250 mL/hr over 120 Minutes Intravenous Every 48 hours 12/18/16 2353     12/18/16 2330  vancomycin (VANCOCIN) 2,000 mg in sodium  chloride 0.9 % 500 mL IVPB     2,000 mg 250 mL/hr over 120 Minutes Intravenous  Once 12/18/16 2325 12/19/16 0159       Subjective:  Confused but states he had a 7L paracentesis prior to admission.  Having some shortness of breath and abdomen is distended and painful.    Objective: Vitals:   12/20/16 0357 12/20/16 0406 12/20/16 0900 12/20/16 1300  BP:  (!) 100/56 (!) 110/59 (!) 103/59  Pulse:  73 79   Resp: Temp:  98.1 F (36.7 C) 98.7 F (37.1 C)   TempSrc:   Oral   SpO2:  99% 98% 99%  Weight:      Height:        Intake/Output Summary (Last 24 hours) at 12/20/16 1608 Last data filed at 12/20/16 1428  Gross per 24 hour  Intake              999 ml  Output              577 ml  Net              422 ml   Filed Weights   12/19/16 0053 12/19/16 0254 12/19/16 2212  Weight: 102.5 kg (226 lb) 102.5 kg (225 lb 15.5 oz) 103 kg (227 lb)    Examination:  General exam:  Adult male, drowsy with difficulty answering questions.  No acute distress.  HEENT:  NCAT, MMM Respiratory system:  tachypneic.  No focal rales, rhonchi, or wheeze.  Diminished at the bilateral bases.   Cardiovascular system: Regular rate and rhythm, normal S1/S2. No murmurs, rubs, gallops or clicks.  Warm extremities Gastrointestinal system: Normal active bowel sounds, soft, moderately distended and TTP diffusely but particularly worse in the bilateral lower quadrants.   MSK:  Normal tone and bulk, no lower extremity edema.  Right AKA.  Heel with a 1cm stage 2 ulcer without surrounding erythema or induration.  Plantar surface of foot with a  2cm stage 3 ulcer with a more central 1cm area that is about 1cm deep.  No abnormal odor or discharge.  Very swollen but not indurated on plantar surface.  Medial foot with 4 cm ulcer with healthy granulation tissue.    Neuro:  Grossly moves all extremities but diffusely weak    Data Reviewed: I have personally reviewed following labs and imaging  studies  CBC:  Recent Labs Lab 12/18/16 1954 12/18/16 2007 12/20/16 0726 12/20/16 0925  WBC 6.6  --  5.3 5.4  NEUTROABS 5.2  --   --   --   HGB 10.0* 10.5* 8.2* 8.3*  HCT 29.0* 31.0* 24.1* 24.7*  MCV 94.8  --  95.6 96.5  PLT 87*  --  58* 57*   Basic Metabolic Panel:  Recent Labs Lab 12/18/16 1954 12/18/16 2007 12/18/16 2348 12/19/16 0545 12/20/16 0726 12/20/16 0925  NA 130* 132*  --  131* 133* 133*  K 5.5* 5.5* 5.2* 5.2*  5.2* 4.0 4.2  CL 99* 99*  --  101 102 104  CO2 21*  --   --  17* 23 22  GLUCOSE 88 88  --  83 134* 172*  BUN 63* 61*  --  63* 68* 67*  CREATININE 3.27* 3.30*  --  3.15* 3.33* 3.31*  CALCIUM 8.8*  --   --  8.4* 8.3* 8.2*  PHOS  --   --   --   --  3.8 3.8   GFR: Estimated Creatinine Clearance: 27.7 mL/min (A) (by C-G formula based on SCr of 3.31 mg/dL (H)). Liver Function Tests:  Recent Labs Lab 12/18/16 1954 12/19/16 0545 12/20/16 0726 12/20/16 0925  AST 49* 43*  --   --   ALT 19 17  --   --   ALKPHOS 72 63  --   --   BILITOT 2.6* 2.5*  --   --   PROT 8.1 7.5  --   --   ALBUMIN 1.9* 1.6* 1.6* 1.6*   No results for input(s): LIPASE, AMYLASE in the last 168 hours.  Recent Labs Lab 12/20/16 0726  AMMONIA 27   Coagulation Profile:  Recent Labs Lab 12/18/16 2348  INR 2.35   Cardiac Enzymes: No results for input(s): CKTOTAL, CKMB, CKMBINDEX, TROPONINI in the last 168 hours. BNP (last 3 results) No results for input(s): PROBNP in the last 8760 hours. HbA1C: No results for input(s): HGBA1C in the last 72 hours. CBG:  Recent Labs Lab 12/19/16 0038 12/19/16 0735 12/19/16 1748 12/20/16 0942  GLUCAP 78 79 81 181*   Lipid Profile: No results for input(s): CHOL, HDL, LDLCALC, TRIG, CHOLHDL, LDLDIRECT in the last 72 hours. Thyroid Function Tests: No results for input(s): TSH, T4TOTAL, FREET4, T3FREE, THYROIDAB in the last 72 hours. Anemia Panel: No results for input(s): VITAMINB12, FOLATE, FERRITIN, TIBC, IRON, RETICCTPCT in  the last 72 hours. Urine analysis:    Component Value Date/Time   COLORURINE YELLOW 12/18/2016 2200   APPEARANCEUR HAZY (A) 12/18/2016 2200   LABSPEC 1.015 12/18/2016 2200   PHURINE 5.0 12/18/2016 2200   GLUCOSEU NEGATIVE 12/18/2016 2200   HGBUR SMALL (A) 12/18/2016 2200   BILIRUBINUR NEGATIVE 12/18/2016 2200   KETONESUR NEGATIVE 12/18/2016 2200   PROTEINUR 30 (A) 12/18/2016 2200   NITRITE NEGATIVE 12/18/2016 2200   LEUKOCYTESUR NEGATIVE 12/18/2016 2200   Sepsis Labs: (procalcitonin:4,lacticidven:4)  ) Recent Results (from the past 240 hour(s))  MRSA PCR Screening     Status: Abnormal   Collection Time: 12/19/16  1:24 AM  Result Value Ref Range Status   MRSA by PCR POSITIVE (A) NEGATIVE Final    Comment:        The GeneXpert MRSA Assay (FDA approved for NASAL specimens only), is one component of a comprehensive MRSA colonization surveillance program. It is not intended to diagnose MRSA infection nor to guide or monitor treatment for MRSA infections. RESULT CALLED TO, READ BACK BY AND VERIFIED WITH: C DAVIS,RN  12/19/16 MKELLY,MLT       Radiology Studies: US Renal  Result Date: 12/19/2016 CLINICAL DATA:  Acute kidney injury EXAM: RENAL / URINARY TRACT ULTRASOUND COMPLETE COMPARISON:  None. FINDINGS: Right Kidney: Length: 11.3 cm. Increased parenchymal echogenicity consistent with medical renal disease. 2 cm cyst at the lateral midpole region. No hydronephrosis. Left Kidney: Length: 12.8 cm. Increased parenchymal echogenicity consistent with medical renal disease. 4.9 cm cyst at the lower pole. 1.4 cm calculus in the midpole collecting system. No hydronephrosis. Bladder: The urinary bladder is empty. Moderate volume peritoneal ascites. IMPRESSION: 1. Increased renal parenchymal echogenicity consistent with medical renal disease. No hydronephrosis. 2. Left midpole calcification may represent  a 14 mm collecting system calculus. No obstructing calculi. 3.  Peritoneal ascites. Electronically Signed   By: Ellery Plunk M.D.   On: 12/19/2016 03:37   Dg Chest Portable 1 View  Result Date: 12/18/2016 CLINICAL DATA:  Dyspnea are EXAM: PORTABLE CHEST 1 VIEW COMPARISON:  None. FINDINGS: Shallow lung inflation. The heart size and mediastinal contours are within normal limits. Both lungs are clear. The visualized skeletal structures are unremarkable. IMPRESSION: No active disease. Electronically Signed   By: Deatra Robinson M.D.   On: 12/18/2016 19:48   Dg Abd Portable 1v  Result Date: 12/19/2016 CLINICAL DATA:  Abdominal pain today. EXAM: PORTABLE ABDOMEN - 1 VIEW COMPARISON:  Plain films the abdomen 10/30/2016. CT abdomen and pelvis 10/23/2016. FINDINGS: The bowel gas pattern is normal. No radio-opaque calculi or other significant radiographic abnormality are seen. IMPRESSION: Negative exam. Electronically Signed   By: Drusilla Kanner M.D.   On: 12/19/2016 12:35   Ir Paracentesis  Result Date: 12/20/2016 INDICATION: Recurrent ascites EXAM: ULTRASOUND-GUIDED PARACENTESIS COMPARISON:  Previous paracentesis MEDICATIONS: 10 cc 1% lidocaine COMPLICATIONS: None immediate. TECHNIQUE: Informed written consent was obtained from the patient after a discussion of the risks, benefits and alternatives to treatment. A timeout was performed prior to the initiation of the procedure. Initial ultrasound scanning demonstrates a large amount of ascites within the left lower abdominal quadrant. The left lower abdomen was prepped and draped in the usual sterile fashion. 1% lidocaine with epinephrine was used for local anesthesia. Under direct ultrasound guidance, a 19 gauge, 7-cm, Yueh catheter was introduced. An ultrasound image was saved for documentation purposed. The paracentesis was performed. The catheter was removed and a dressing was applied. The patient tolerated the procedure well without immediate post procedural complication. FINDINGS: A total of approximately 50 cc only of  yellow fluid was removed. Samples were sent to the laboratory as requested by the clinical team. IMPRESSION: Successful ultrasound-guided paracentesis yielding 50 cc only of peritoneal fluid. Diagnostic only per MD. 50 cc only. Read by Robet Leu [facility] Electronically Signed   By: Judie Petit.  Shick M.D.   On: 12/20/2016 15:14     Scheduled Meds: . albumin human  25 g Intravenous TID  . atorvastatin  40 mg Oral Daily  . brimonidine  1 drop Both Eyes Daily  . feeding supplement (ENSURE ENLIVE)  237 mL Oral BID BM  . feeding supplement (PRO-STAT SUGAR FREE 64)  30 mL Oral TID  . lactulose  10 g Oral Daily  . lidocaine      . midodrine  10 mg Oral TID WC  . multivitamin with minerals  1 tablet Oral Daily  . pantoprazole  40 mg Oral BID  . saccharomyces boulardii  250 mg Oral BID  . sodium chloride flush  3 mL Intravenous Q12H  . sucralfate  1 g Oral TID AC & HS  . [START ON 12/21/2016] vancomycin  1,500 mg Intravenous Q48H   Continuous Infusions: . octreotide  (SANDOSTATIN)    IV infusion       LOS: 2 days    Time spent: 30 min    Renae Fickle, MD [facility] Pager [phone]  If 7PM-7AM, please contact night-coverage [email or web address] Password [email or web address] 12/20/2016, 4:08 PM

## 2016-12-21 DIAGNOSIS — R1013 Epigastric pain: Secondary | ICD-10-CM

## 2016-12-21 LAB — COMPREHENSIVE METABOLIC PANEL
ALT: 15 U/L — ABNORMAL LOW (ref 17–63)
AST: 35 U/L (ref 15–41)
Albumin: 1.9 g/dL — ABNORMAL LOW (ref 3.5–5.0)
Alkaline Phosphatase: 48 U/L (ref 38–126)
Anion gap: 9 (ref 5–15)
BUN: 64 mg/dL — AB (ref 6–20)
CHLORIDE: 103 mmol/L (ref 101–111)
CO2: 21 mmol/L — AB (ref 22–32)
Calcium: 8.3 mg/dL — ABNORMAL LOW (ref 8.9–10.3)
Creatinine, Ser: 3.22 mg/dL — ABNORMAL HIGH (ref 0.61–1.24)
GFR calc Af Amer: 22 mL/min — ABNORMAL LOW (ref >60)
GFR, EST NON AFRICAN AMERICAN: 19 mL/min — AB (ref >60)
Glucose, Bld: 162 mg/dL — ABNORMAL HIGH (ref 65–99)
POTASSIUM: 4.2 mmol/L (ref 3.5–5.1)
SODIUM: 133 mmol/L — AB (ref 135–145)
Total Bilirubin: 1.6 mg/dL — ABNORMAL HIGH (ref 0.3–1.2)
Total Protein: 6.7 g/dL (ref 6.5–8.1)

## 2016-12-21 LAB — RENAL FUNCTION PANEL
ALBUMIN: 1.9 g/dL — AB (ref 3.5–5.0)
ANION GAP: 10 (ref 5–15)
BUN: 64 mg/dL — AB (ref 6–20)
CO2: 21 mmol/L — ABNORMAL LOW (ref 22–32)
Calcium: 8.3 mg/dL — ABNORMAL LOW (ref 8.9–10.3)
Chloride: 102 mmol/L (ref 101–111)
Creatinine, Ser: 3.19 mg/dL — ABNORMAL HIGH (ref 0.61–1.24)
GFR, EST AFRICAN AMERICAN: 22 mL/min — AB (ref >60)
GFR, EST NON AFRICAN AMERICAN: 19 mL/min — AB (ref >60)
Glucose, Bld: 163 mg/dL — ABNORMAL HIGH (ref 65–99)
PHOSPHORUS: 3.6 mg/dL (ref 2.5–4.6)
POTASSIUM: 4.2 mmol/L (ref 3.5–5.1)
Sodium: 133 mmol/L — ABNORMAL LOW (ref 135–145)

## 2016-12-21 LAB — CBC
HEMATOCRIT: 24.4 % — AB (ref 39.0–52.0)
Hemoglobin: 8 g/dL — ABNORMAL LOW (ref 13.0–17.0)
MCH: 32.1 pg (ref 26.0–34.0)
MCHC: 32.8 g/dL (ref 30.0–36.0)
MCV: 98 fL (ref 78.0–100.0)
Platelets: 46 10*3/uL — ABNORMAL LOW (ref 150–400)
RBC: 2.49 MIL/uL — AB (ref 4.22–5.81)
RDW: 19.9 % — AB (ref 11.5–15.5)
WBC: 4.3 10*3/uL (ref 4.0–10.5)

## 2016-12-21 LAB — PATHOLOGIST SMEAR REVIEW

## 2016-12-21 LAB — GLUCOSE, CAPILLARY
GLUCOSE-CAPILLARY: 163 mg/dL — AB (ref 65–99)
GLUCOSE-CAPILLARY: 172 mg/dL — AB (ref 65–99)
Glucose-Capillary: 140 mg/dL — ABNORMAL HIGH (ref 65–99)

## 2016-12-21 LAB — SODIUM, URINE, RANDOM: SODIUM UR: 35 mmol/L

## 2016-12-21 LAB — PHOSPHORUS: PHOSPHORUS: 3.7 mg/dL (ref 2.5–4.6)

## 2016-12-21 LAB — CREATININE, URINE, RANDOM: CREATININE, URINE: 152.28 mg/dL

## 2016-12-21 MED ORDER — CHLORHEXIDINE GLUCONATE CLOTH 2 % EX PADS
6.0000 | MEDICATED_PAD | Freq: Every day | CUTANEOUS | Status: AC
  Administered 2016-12-21 – 2016-12-24 (×2): 6 via TOPICAL

## 2016-12-21 MED ORDER — MUPIROCIN 2 % EX OINT
1.0000 "application " | TOPICAL_OINTMENT | Freq: Two times a day (BID) | CUTANEOUS | Status: AC
  Administered 2016-12-21 – 2016-12-25 (×10): 1 via NASAL
  Filled 2016-12-21 (×2): qty 22

## 2016-12-21 MED ORDER — INSULIN ASPART 100 UNIT/ML ~~LOC~~ SOLN
0.0000 [IU] | Freq: Every day | SUBCUTANEOUS | Status: DC
  Administered 2016-12-26: 2 [IU] via SUBCUTANEOUS

## 2016-12-21 MED ORDER — MIDODRINE HCL 5 MG PO TABS
15.0000 mg | ORAL_TABLET | Freq: Three times a day (TID) | ORAL | Status: DC
  Administered 2016-12-21 – 2016-12-27 (×14): 15 mg via ORAL
  Filled 2016-12-21 (×15): qty 3

## 2016-12-21 MED ORDER — INSULIN ASPART 100 UNIT/ML ~~LOC~~ SOLN
0.0000 [IU] | Freq: Three times a day (TID) | SUBCUTANEOUS | Status: DC
  Administered 2016-12-21: 2 [IU] via SUBCUTANEOUS
  Administered 2016-12-22 – 2016-12-23 (×3): 1 [IU] via SUBCUTANEOUS
  Administered 2016-12-26: 2 [IU] via SUBCUTANEOUS
  Administered 2016-12-26: 3 [IU] via SUBCUTANEOUS
  Administered 2016-12-27 (×2): 2 [IU] via SUBCUTANEOUS

## 2016-12-21 NOTE — Progress Notes (Addendum)
PROGRESS NOTE  Jeffery [name]  YQI:[id number] DOB: January 11, 1951 DOA: 12/18/2016 PCP: Julian Hy, MD  Brief Narrative:  Jeffery Flurchickis a 66 y.o.malewith medical history significant for end-stage liver cirrhosis, insulin-dependent diabetes mellitus, and chronic kidney disease stage III who presented from his nursing facility for evaluation of nausea, vomiting, and generalized weakness.  Patient was admitted to the hospital in February 2018 with osteomyelitis involving the left foot;he underwent first ray amputation with clean margins reported and was discharged to a skilled nursing facility. A culture reportedly grew staph and he wasplaced on Bactrim empirically on 12/12/2016. Since that time, he has been evaluated by orthopedics andan outpatient MRI was ordered but not yet performed. Since that time, he developed nausea with nonbloody nonbilious vomiting and progressive weakness.  He was found to have AKI with a serum creatinine of 3.27 up from 1.6 g/dL in March.  This is likely multifactorial due to infection, bactrim, and recent paracentesis.  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 Acute kidney injury superimposed on CKD stage III, creatinine unchanged despite 2L in ER and IVF for 2 days.  Abdomen is distended and given ESLD will treat for hepatorenal syndrome -  SCr is 3.27, up from 1.6 in March 2018  -  Bactrim discontinued at admission -  Albumin TID  -  Increase midodrine to  TID -  Continue octreotide  2. Osteomyelitis of left foot s/p first ray  amputation, admitted in February 2018 with left foot osteo and underwent 1st ray amputation with pathology reporting clean margins.  Two full thickness wounds on left foot.  Also stage 2 ulcer on heel. - Wet to dry dressing changes twice daily - He had a culture grow staph per report, he is MRSA positive, and he was started on Bactrim 12/12/16  - Saw ortho in clinic 3/29 and had MRI ordered, but not done yet - continue empiric vancomycin  -  Wound care assistance appreciated -  MRI is on hold:  Best to use contrast to determine osteomyelitis so will wait until kidney function has improved some  3. Liver cirrhosis  - Has esophageal varices, grade I by EGD in February 2018  - Lasix and Aldactone held on admission in light of N/V with AKI  - agree with d/c nadolol  - Continue Protonix   Metabolic encephalopathy, suspect hepatic encephalopathy based on history despite normal ammonia level.  Improved with lactulose and rifaximin -  Continue lactulose and rifaximin -  US guided diagnostic paracentesis:  SBP unlikely given low WBC count -  Body fluid culture pending  4. Hyperkalemia, resolved with IVF and kayexelate  5. Hyponatremia, mild and asymptomatic in setting of cirrhosis  6. Normocytic anemia, thrombocytopenia due to cirrhosis - Both indices appear to be stable with no evidence for active blood loss  - Follow periodic CBC   7. Hypotension - improved with hydration  DVT prophylaxis:  SCDs  Code Status:  DNR Family Communication:  Patient alone  Disposition Plan:  Home pending improvement in kidney function  Consultants:   Nephrology  Wound care  Procedures:  US guided paracentesis   Antimicrobials:  Anti-infectives    Start     Dose/Rate Route Frequency Ordered Stop   12/21/16 0600  vancomycin (VANCOCIN) 1,500 mg in sodium chloride 0.9 % 500 mL IVPB     1,500 mg 250 mL/hr over 120 Minutes Intravenous Every 48 hours 12/18/16 2353     12/20/16 2200  rifaximin  (XIFAXAN) tablet 550 mg     550 mg Oral 2 times daily 12/20/16 1801     12/18/16 2330  vancomycin (VANCOCIN) 2,000 mg in sodium chloride 0.9 % 500 mL IVPB     2,000 mg 250 mL/hr over 120 Minutes Intravenous  Once 12/18/16 2325 12/19/16 0159       Subjective:  Abdominal pain improved.  Just having some discomfort related to body wall edema.  Denies SOB.  Left foot is hurting some.    Objective: Vitals:   12/20/16 1824 12/20/16 2030 12/21/16 0545 12/21/16 0929  BP: (!) 110/53 (!) 111/57 97/65 (!) 101/48  Pulse: 69 64 61 (!) 55  Resp:  Temp: 98.5 F (36.9 C) 98 F (36.7 C) 98.2 F (36.8 C) 97.5 F (36.4 C)  TempSrc: Oral Oral Oral Oral  SpO2: 98% 94% 95% 99%  Weight:   103 kg (227 lb)   Height:        Intake/Output Summary (Last 24 hours) at 12/21/16 1558 Last data filed at 12/21/16 1400  Gross per 24 hour  Intake          1628.83 ml  Output              551 ml  Net          1077.83 ml   Filed Weights   12/19/16 0254 12/19/16 2212 12/21/16 0545  Weight: 102.5 kg (225 lb 15.5 oz) 103 kg (227 lb) 103 kg (227 lb)    Examination:  General exam:  Adult male, awake, alert and making jokes HEENT:  NCAT, MMM Respiratory system:  No focal rales, rhonchi, or wheeze.  Diminished at the bilateral bases.   Cardiovascular system: Regular rate and rhythm, normal S1/S2. No murmurs, rubs, gallops or clicks.  Warm extremities Gastrointestinal system: Normal active bowel sounds, soft, moderate to severe distension and TTP in bilateral lower quadrants.  Skin is edematous in dependent regions.   MSK:  Normal tone and bulk, 1+ pitting left lower extremity edema.  Right AKA.  Heel with a 1cm stage 2 ulcer without surrounding erythema or induration.  Plantar surface of foot with a  2cm stage 3 ulcer with a more central 1cm area that is about 1cm deep.  More discharge today compared to yesterday.  Very swollen but not indurated on plantar surface.  Medial foot with 4 cm ulcer with  healthy granulation tissue.   Neuro:  Grossly moves upper extremities but diffusely weak    Data Reviewed: I have personally reviewed following labs and imaging studies  CBC:  Recent Labs Lab 12/18/16 1954 12/18/16 2007 12/20/16 0726 12/20/16 0925 12/20/16 1605 12/21/16 0527  WBC 6.6  --  5.3 5.4  --  4.3  NEUTROABS 5.2  --   --   --  6.4  --   HGB 10.0* 10.5* 8.2* 8.3*  --  8.0*  HCT 29.0* 31.0* 24.1* 24.7*  --  24.4*  MCV 94.8  --  95.6 96.5  --  98.0  PLT 87*  --  58*  57*  --  46*   Basic Metabolic Panel:  Recent Labs Lab 12/18/16 1954 12/18/16 2007 12/18/16 2348 12/19/16 0545 12/20/16 0726 12/20/16 0925 12/21/16 0527  NA 130* 132*  --  131* 133* 133* 133*  133*  K 5.5* 5.5* 5.2* 5.2*  5.2* 4.0 4.2 4.2  4.2  CL 99* 99*  --  101 102 104 103  102  CO2 21*  --   --  17* 23 22 21*  21*  GLUCOSE 88 88  --  83 134* 172* 162*  163*  BUN 63* 61*  --  63* 68* 67* 64*  64*  CREATININE 3.27* 3.30*  --  3.15* 3.33* 3.31* 3.22*  3.19*  CALCIUM 8.8*  --   --  8.4* 8.3* 8.2* 8.3*  8.3*  PHOS  --   --   --   --  3.8 3.8 3.7  3.6   GFR: Estimated Creatinine Clearance: 28.7 mL/min (A) (by C-G formula based on SCr of 3.19 mg/dL (H)). Liver Function Tests:  Recent Labs Lab 12/18/16 1954 12/19/16 0545 12/20/16 0726 12/20/16 0925 12/21/16 0527  AST 49* 43*  --   --  35  ALT 19 17  --   --  15*  ALKPHOS 72 63  --   --  48  BILITOT 2.6* 2.5*  --   --  1.6*  PROT 8.1 7.5  --   --  6.7  ALBUMIN 1.9* 1.6* 1.6* 1.6* 1.9*  1.9*   No results for input(s): LIPASE, AMYLASE in the last 168 hours.  Recent Labs Lab 12/20/16 0726  AMMONIA 27   Coagulation Profile:  Recent Labs Lab 12/18/16 2348  INR 2.35   Cardiac Enzymes: No results for input(s): CKTOTAL, CKMB, CKMBINDEX, TROPONINI in the last 168 hours. BNP (last 3 results) No results for input(s): PROBNP in the last 8760 hours. HbA1C: No results for input(s): HGBA1C in the last 72 hours. CBG:  Recent  Labs Lab 12/19/16 0038 12/19/16 0735 12/19/16 1748 12/20/16 0942 12/21/16 0741  GLUCAP 78 79 81 181* 140*   Lipid Profile: No results for input(s): CHOL, HDL, LDLCALC, TRIG, CHOLHDL, LDLDIRECT in the last 72 hours. Thyroid Function Tests: No results for input(s): TSH, T4TOTAL, FREET4, T3FREE, THYROIDAB in the last 72 hours. Anemia Panel: No results for input(s): VITAMINB12, FOLATE, FERRITIN, TIBC, IRON, RETICCTPCT in the last 72 hours. Urine analysis:    Component Value Date/Time   COLORURINE YELLOW 12/18/2016 2200   APPEARANCEUR HAZY (A) 12/18/2016 2200   LABSPEC 1.015 12/18/2016 2200   PHURINE 5.0 12/18/2016 2200   GLUCOSEU NEGATIVE 12/18/2016 2200   HGBUR SMALL (A) 12/18/2016 2200   BILIRUBINUR NEGATIVE 12/18/2016 2200   KETONESUR NEGATIVE 12/18/2016 2200   PROTEINUR 30 (A) 12/18/2016 2200   NITRITE NEGATIVE 12/18/2016 2200   LEUKOCYTESUR NEGATIVE 12/18/2016 2200   Sepsis Labs: (procalcitonin:4,lacticidven:4)  ) Recent Results (from the past 240 hour(s))  MRSA PCR Screening     Status: Abnormal   Collection Time: 12/19/16  1:24 AM  Result Value Ref Range Status   MRSA by PCR POSITIVE (A) NEGATIVE Final    Comment:        The GeneXpert MRSA Assay (FDA approved for NASAL specimens only), is one component of a comprehensive MRSA colonization surveillance program. It is not intended to diagnose MRSA infection nor to guide or monitor treatment for MRSA infections. RESULT CALLED TO, READ BACK BY AND VERIFIED WITH: C DAVIS,RN  12/19/16 MKELLY,MLT   Gram stain  Status: None   Collection Time: 12/20/16  3:11 PM  Result Value Ref Range Status   Specimen Description FLUID PERITONEAL  Final   Special Requests NONE  Final   Gram Stain   Final    ABUNDANT WBC PRESENT,BOTH PMN AND MONONUCLEAR NO ORGANISMS SEEN    Report Status 12/20/2016 FINAL  Final  Culture, body fluid-bottle     Status: None (Preliminary result)   Collection Time: 12/20/16  3:11  PM  Result Value Ref Range Status   Specimen Description FLUID PERITONEAL  Final   Special Requests BOTTLES DRAWN AEROBIC AND ANAEROBIC 10CC  Final   Culture NO GROWTH < 24 HOURS  Final   Report Status PENDING  Incomplete      Radiology Studies: Ir Paracentesis  Result Date: 12/20/2016 INDICATION: Recurrent ascites EXAM: ULTRASOUND-GUIDED PARACENTESIS COMPARISON:  Previous paracentesis MEDICATIONS: 10 cc 1% lidocaine COMPLICATIONS: None immediate. TECHNIQUE: Informed written consent was obtained from the patient after a discussion of the risks, benefits and alternatives to treatment. A timeout was performed prior to the initiation of the procedure. Initial ultrasound scanning demonstrates a large amount of ascites within the left lower abdominal quadrant. The left lower abdomen was prepped and draped in the usual sterile fashion. 1% lidocaine with epinephrine was used for local anesthesia. Under direct ultrasound guidance, a 19 gauge, 7-cm, Yueh catheter was introduced. An ultrasound image was saved for documentation purposed. The paracentesis was performed. The catheter was removed and a dressing was applied. The patient tolerated the procedure well without immediate post procedural complication. FINDINGS: A total of approximately 50 cc only of yellow fluid was removed. Samples were sent to the laboratory as requested by the clinical team. IMPRESSION: Successful ultrasound-guided paracentesis yielding 50 cc only of peritoneal fluid. Diagnostic only per MD. 50 cc only. Read by Robet Leu [facility] Electronically Signed   By: Judie Petit.  Shick M.D.   On: 12/20/2016 15:14     Scheduled Meds: . albumin human  25 g Intravenous TID  . atorvastatin  40 mg Oral Daily  . brimonidine  1 drop Both Eyes Daily  . Chlorhexidine Gluconate Cloth  6 each Topical Q0600  . feeding supplement (ENSURE ENLIVE)  237 mL Oral BID BM  . feeding supplement (PRO-STAT SUGAR FREE 64)  30 mL Oral TID  . lactulose  10 g Oral Daily    . midodrine  15 mg Oral TID WC  . multivitamin with minerals  1 tablet Oral Daily  . mupirocin ointment  1 application Nasal BID  . pantoprazole  40 mg Oral BID  . rifaximin  550 mg Oral BID  . saccharomyces boulardii  250 mg Oral BID  . sodium chloride flush  3 mL Intravenous Q12H  . sucralfate  1 g Oral TID AC & HS  . vancomycin  1,500 mg Intravenous Q48H   Continuous Infusions: . sodium chloride 50 mL/hr at 12/20/16 1930  . octreotide  (SANDOSTATIN)    IV infusion 50 mcg/hr (12/21/16 0617)     LOS: 3 days    Time spent: 30 min    Renae Fickle, MD [facility] Pager [phone]  If 7PM-7AM, please contact night-coverage [email or web address] Password [facility] 12/21/2016, 3:58 PM

## 2016-12-21 NOTE — Progress Notes (Signed)
OT Screen     12/21/16 1400  OT Visit Information  Last OT Received On 12/21/16  Reason Eval/Treat Not Completed OT screened, no needs identified, will sign off (Pt with no skilled OT needs noted in speaking with PT. Dependent for ADLs and functional mobility. DC to prior SNF. With follow up as they deem necessary.)   Curlene Dolphin, OTR/L [phone]

## 2016-12-21 NOTE — Progress Notes (Signed)
Subjective: Interval History: has complaints feels better but concern of foot..  Objective: Vital signs in last 24 hours: Temp:  [97.5 F (36.4 C)-98.5 F (36.9 C)] 97.5 F (36.4 C) (04/05 0929) Pulse Rate:  [55-80] 55 (04/05 0929) Resp:  [16-18] 16 (04/05 0929) BP: (97-125)/(48-65) 101/48 (04/05 0929) SpO2:  [94 %-99 %] 99 % (04/05 0929) Weight:  [103 kg (227 lb)] 103 kg (227 lb) (04/05 0545) Weight change: 0 kg (0 lb)  Intake/Output from previous day: 04/04 0701 - 04/05 0700 In: 1448.8 [P.O.:480; I.V.:768.8; IV Piggyback:200] Out: 403 [Urine:400; Stool:3] Intake/Output this shift: Total I/O In: 480 [P.O.:480] Out: -   General appearance: alert, cooperative, pale and toxic Resp: diminished breath sounds bilaterally and rales bibasilar Cardio: S1, S2 normal and systolic murmur: systolic ejection 2/6, decrescendo at 2nd left intercostal space GI: pos bs, liver down 6 cm, pos FW, distended Extremities: edema 2-3+ and BK on L , ulcers deep on R. R foot very swollen locally  Lab Results:  Recent Labs  12/20/16 0925 12/21/16 0527  WBC 5.4 4.3  HGB 8.3* 8.0*  HCT 24.7* 24.4*  PLT 57* 46*   BMET:  Recent Labs  12/20/16 0925 12/21/16 0527  NA 133* 133*  133*  K 4.2 4.2  4.2  CL 104 103  102  CO2 22 21*  21*  GLUCOSE 172* 162*  163*  BUN 67* 64*  64*  CREATININE 3.31* 3.22*  3.19*  CALCIUM 8.2* 8.3*  8.3*   No results for input(s): PTH in the last 72 hours. Iron Studies: No results for input(s): IRON, TIBC, TRANSFERRIN, FERRITIN in the last 72 hours.  Studies/Results: Dg Abd Portable 1v  Result Date: 12/19/2016 CLINICAL DATA:  Abdominal pain today. EXAM: PORTABLE ABDOMEN - 1 VIEW COMPARISON:  Plain films the abdomen 10/30/2016. CT abdomen and pelvis 10/23/2016. FINDINGS: The bowel gas pattern is normal. No radio-opaque calculi or other significant radiographic abnormality are seen. IMPRESSION: Negative exam. Electronically Signed   By: Drusilla Kanner M.D.    On: 12/19/2016 12:35   Ir Paracentesis  Result Date: 12/20/2016 INDICATION: Recurrent ascites EXAM: ULTRASOUND-GUIDED PARACENTESIS COMPARISON:  Previous paracentesis MEDICATIONS: 10 cc 1% lidocaine COMPLICATIONS: None immediate. TECHNIQUE: Informed written consent was obtained from the patient after a discussion of the risks, benefits and alternatives to treatment. A timeout was performed prior to the initiation of the procedure. Initial ultrasound scanning demonstrates a large amount of ascites within the left lower abdominal quadrant. The left lower abdomen was prepped and draped in the usual sterile fashion. 1% lidocaine with epinephrine was used for local anesthesia. Under direct ultrasound guidance, a 19 gauge, 7-cm, Yueh catheter was introduced. An ultrasound image was saved for documentation purposed. The paracentesis was performed. The catheter was removed and a dressing was applied. The patient tolerated the procedure well without immediate post procedural complication. FINDINGS: A total of approximately 50 cc only of yellow fluid was removed. Samples were sent to the laboratory as requested by the clinical team. IMPRESSION: Successful ultrasound-guided paracentesis yielding 50 cc only of peritoneal fluid. Diagnostic only per MD. 50 cc only. Read by Robet Leu [facility] Electronically Signed   By: Judie Petit.  Shick M.D.   On: 12/20/2016 15:14    I have reviewed the patient's current medications.  Assessment/Plan: 1 AKI/CKD 3  Drug vs AIN, less likely HRS.  Vol fair, some xs. Cont ivf.  Cr stable to better.  Mild acidemia 2 Cirrhosis 3 Foot infx suspect will need BKA 4 DM  5 PVD 6 Anemia P cont ivf, alb, mido,  Vanco,     LOS: 3 days   Fabian Walder L 12/21/2016,10:34 AM

## 2016-12-22 ENCOUNTER — Inpatient Hospital Stay (HOSPITAL_COMMUNITY): Payer: BC Managed Care – PPO

## 2016-12-22 DIAGNOSIS — M869 Osteomyelitis, unspecified: Secondary | ICD-10-CM

## 2016-12-22 DIAGNOSIS — M009 Pyogenic arthritis, unspecified: Secondary | ICD-10-CM

## 2016-12-22 DIAGNOSIS — L02612 Cutaneous abscess of left foot: Secondary | ICD-10-CM

## 2016-12-22 DIAGNOSIS — R188 Other ascites: Secondary | ICD-10-CM

## 2016-12-22 LAB — GLUCOSE, CAPILLARY
GLUCOSE-CAPILLARY: 140 mg/dL — AB (ref 65–99)
GLUCOSE-CAPILLARY: 153 mg/dL — AB (ref 65–99)
Glucose-Capillary: 147 mg/dL — ABNORMAL HIGH (ref 65–99)
Glucose-Capillary: 135 mg/dL — ABNORMAL HIGH (ref 65–99)

## 2016-12-22 LAB — RENAL FUNCTION PANEL
Albumin: 2.4 g/dL — ABNORMAL LOW (ref 3.5–5.0)
Anion gap: 9 (ref 5–15)
BUN: 59 mg/dL — ABNORMAL HIGH (ref 6–20)
CHLORIDE: 103 mmol/L (ref 101–111)
CO2: 22 mmol/L (ref 22–32)
CREATININE: 2.91 mg/dL — AB (ref 0.61–1.24)
Calcium: 8.4 mg/dL — ABNORMAL LOW (ref 8.9–10.3)
GFR calc non Af Amer: 21 mL/min — ABNORMAL LOW (ref >60)
GFR, EST AFRICAN AMERICAN: 24 mL/min — AB (ref >60)
GLUCOSE: 133 mg/dL — AB (ref 65–99)
Phosphorus: 3.5 mg/dL (ref 2.5–4.6)
Potassium: 4.2 mmol/L (ref 3.5–5.1)
SODIUM: 134 mmol/L — AB (ref 135–145)

## 2016-12-22 LAB — C4 COMPLEMENT: COMPLEMENT C4, BODY FLUID: 5 mg/dL — AB (ref 14–44)

## 2016-12-22 LAB — C3 COMPLEMENT: C3 COMPLEMENT: 29 mg/dL — AB (ref 82–167)

## 2016-12-22 MED ORDER — PIPERACILLIN-TAZOBACTAM 3.375 G IVPB
3.3750 g | Freq: Three times a day (TID) | INTRAVENOUS | Status: DC
  Administered 2016-12-22 – 2016-12-24 (×3): 3.375 g via INTRAVENOUS
  Filled 2016-12-22 (×6): qty 50

## 2016-12-22 MED ORDER — HEPARIN SODIUM (PORCINE) 5000 UNIT/ML IJ SOLN
5000.0000 [IU] | Freq: Three times a day (TID) | INTRAMUSCULAR | Status: DC
  Administered 2016-12-22 – 2016-12-27 (×13): 5000 [IU] via SUBCUTANEOUS
  Filled 2016-12-22 (×11): qty 1

## 2016-12-22 NOTE — Progress Notes (Signed)
Subjective: Interval History: has complaints feels better, still N.  Objective: Vital signs in last 24 hours: Temp:  [97.7 F (36.5 C)-98 F (36.7 C)] 98 F (36.7 C) (04/06 0858) Pulse Rate:  [50-54] 54 (04/06 0858) Resp:  [18] 18 (04/06 0858) BP: (101-114)/(53-67) 103/55 (04/06 0858) SpO2:  [97 %-100 %] 98 % (04/06 0858) Weight change:   Intake/Output from previous day: 04/05 0701 - 04/06 0700 In: 3045 [P.O.:1020; I.V.:1725; IV Piggyback:300] Out: 150 [Urine:150] Intake/Output this shift: No intake/output data recorded.  General appearance: alert, cooperative, no distress and pale Resp: diminished breath sounds bilaterally and rales bibasilar Cardio: S1, S2 normal and systolic murmur: holosystolic 2/6, blowing at apex GI: pos FW, pos bs, mild tender Extremities: edema 3+, RBKA,  dressing Lfoot and ulcers  Lab Results:  Recent Labs  12/20/16 0925 12/21/16 0527  WBC 5.4 4.3  HGB 8.3* 8.0*  HCT 24.7* 24.4*  PLT 57* 46*   BMET:  Recent Labs  12/21/16 0527 12/22/16 0504  NA 133*  133* 134*  K 4.2  4.2 4.2  CL 103  102 103  CO2 21*  21* 22  GLUCOSE 162*  163* 133*  BUN 64*  64* 59*  CREATININE 3.22*  3.19* 2.91*  CALCIUM 8.3*  8.3* 8.4*   No results for input(s): PTH in the last 72 hours. Iron Studies: No results for input(s): IRON, TIBC, TRANSFERRIN, FERRITIN in the last 72 hours.  Studies/Results: Ir Paracentesis  Result Date: 12/20/2016 INDICATION: Recurrent ascites EXAM: ULTRASOUND-GUIDED PARACENTESIS COMPARISON:  Previous paracentesis MEDICATIONS: 10 cc 1% lidocaine COMPLICATIONS: None immediate. TECHNIQUE: Informed written consent was obtained from the patient after a discussion of the risks, benefits and alternatives to treatment. A timeout was performed prior to the initiation of the procedure. Initial ultrasound scanning demonstrates a large amount of ascites within the left lower abdominal quadrant. The left lower abdomen was prepped and draped  in the usual sterile fashion. 1% lidocaine with epinephrine was used for local anesthesia. Under direct ultrasound guidance, a 19 gauge, 7-cm, Yueh catheter was introduced. An ultrasound image was saved for documentation purposed. The paracentesis was performed. The catheter was removed and a dressing was applied. The patient tolerated the procedure well without immediate post procedural complication. FINDINGS: A total of approximately 50 cc only of yellow fluid was removed. Samples were sent to the laboratory as requested by the clinical team. IMPRESSION: Successful ultrasound-guided paracentesis yielding 50 cc only of peritoneal fluid. Diagnostic only per MD. 50 cc only. Read by Robet Leu [facility] Electronically Signed   By: Judie Petit.  Shick M.D.   On: 12/20/2016 15:14    I have reviewed the patient's current medications.  Assessment/Plan: 1 CKD3/AKI  ? Septra but very low comp (can see in liver dz, but not usually this low.).  Suspect infx a role.  On Vanco.  Can do bone scan, or noncontrast MRI 2 DM 3 Foot ulcer with infx, ??amp 4 Cirrhosis will cont alb another 24h only, and oct 5 anemia 6 low ptlt liver P stop ivf, check ASO (only tests strep and this can occur with staph).  Stop alb/oct tomorrow    LOS: 4 days   Jereline Ticer L 12/22/2016,11:04 AM

## 2016-12-22 NOTE — Progress Notes (Addendum)
Pharmacy Antibiotic Note  Jeffery [name] is a 66 y.o. male admitted on 12/18/2016 with weakness/ARF, left foot wound, poss osteomyelitis on Bactrim at [facility] .  Pharmacy has been consulted for Vancomycin dosing. Now adding Zosyn per Rx.  AoCKD, SCr currently down 2.91 with eCrCl ~47mL/min.  Plan: Zosyn 3.375g IV q8h (4h infusion) Vancomycin 1500 mg IV q48h AM vancomycin random level to assess clearance F/U renal function, MRI, clinical progression, LOT  Height:  (185.4 cm) Weight: 227 lb (103 kg) IBW/kg (Calculated) : 79.9  Temp (24hrs), Avg:97.8 F (36.6 C), Min:97.7 F (36.5 C), Max:98 F (36.7 C)   Recent Labs Lab 12/18/16 1954  12/19/16 0545 12/20/16 0726 12/20/16 0925 12/21/16 0527 12/22/16 0504  WBC 6.6  --   --  5.3 5.4 4.3  --   CREATININE 3.27*  < > 3.15* 3.33* 3.31* 3.22*  3.19* 2.91*  < > = values in this interval not displayed.  Estimated Creatinine Clearance: 31.5 mL/min (A) (by C-G formula based on SCr of 2.91 mg/dL (H)).    No Known Allergies  Vanc 4/2 >> Zosyn 4/6 >>  Outpt cx: stated to be growing Staph Aureus (no sens listed - presumed MRSA) 4/3 MRSA PCR: positive 4/4 peritoneal fluid cx: pending  Babs Bertin, PharmD, BCPS Clinical Pharmacist 12/22/2016 5:06 PM

## 2016-12-22 NOTE — Progress Notes (Signed)
PROGRESS NOTE  Jeffery [name]  ZOX:[id number] DOB: 03/12/1951 DOA: 12/18/2016 PCP: Julian Hy, MD  Brief Narrative:   Jeffery [name] a 66 y.o.malewith medical history significant for end-stage liver cirrhosis, insulin-dependent diabetes mellitus, and chronic kidney disease stage III who presented from his nursing facility for evaluation of nausea, vomiting, and generalized weakness.  Patient was admitted to the hospital in February 2018 with osteomyelitis involving the left foot;he underwent first ray amputation with clean margins reported and was discharged to a skilled nursing facility. A culture reportedly grew staph and he wasplaced on Bactrim empirically on 12/12/2016. Since that time, he has been evaluated by orthopedics andan outpatient MRI was ordered but not yet performed. Since that time, he developed nausea with nonbloody nonbilious vomiting and progressive weakness.  He was found to have AKI with a serum creatinine of 3.27 up from 1.6 g/dL in March.  This is likely multifactorial due to infection, bactrim, and recent paracentesis.  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 Acute kidney injury superimposed on CKD stage III due to end-stage liver disease, bactrim, recent large volume paracentesis, and Staph infection of the left foot.  Creatinine trending down slightly today.   -  SCr is 3.27, up from 1.6 in March 2018  -   Bactrim discontinued at admission -  Albumin, octreotide and midodrine per nephrology -  C3 29 and C4 5, very low suggesting complement mediated nephropathy  2. Osteomyelitis, septic arthritis, and abscess of left foot s/p first ray amputation, admitted in February 2018 with left foot osteo and underwent 1st ray amputation with pathology reporting clean margins.  Two full thickness wounds on left foot.  Also stage 2 ulcer on heel. - Wet to dry dressing changes twice daily - He had a culture grow staph per report, he is MRSA positive, and he was started on Bactrim 12/12/16  - MRI demonstrates osteomyelitis, possible septic arthritis, and possible small abscess of the left foot - continue empiric vancomycin - add zosyn -  Orthopedics consulted.  Spoke with Dr. Ophelia Charter who is sending a colleague to assess patient and review MRI  3. Liver cirrhosis  - Has esophageal varices, grade I by EGD in February 2018  - Lasix and Aldactone held on admission in light of N/V with AKI  - agree with d/c nadolol  - Continue Protonix   Metabolic encephalopathy, suspect hepatic encephalopathy based on history despite normal ammonia level.  Improved with lactulose and rifaximin but still having memory problems -  Continue lactulose and rifaximin -  US guided diagnostic paracentesis:  SBP unlikely given low WBC count -  Body fluid culture NGTD  4. Hyperkalemia, resolved with IVF and kayexelate  5. Hyponatremia, mild and asymptomatic in setting of cirrhosis  6. Normocytic anemia, thrombocytopenia due to cirrhosis - Both indices appear to be stable with no evidence for active blood loss  - Follow periodic CBC   7.  Hypotension - improved with hydration  DVT prophylaxis:  Heparin  Code Status:  DNR Family Communication:  Patient alone  Disposition Plan:  Will likely need amputation of the left foot    Consultants:   Nephrology  Wound care  Procedures:  US guided paracentesis   Antimicrobials:   Anti-infectives    Start     Dose/Rate Route Frequency Ordered Stop   12/21/16 0600  vancomycin (VANCOCIN) 1,500 mg in sodium chloride 0.9 % 500 mL IVPB     1,500 mg 250 mL/hr over 120 Minutes Intravenous Every 48 hours 12/18/16 2353     12/20/16 2200  rifaximin (XIFAXAN) tablet 550 mg     550 mg Oral 2 times daily 12/20/16 1801     12/18/16 2330  vancomycin (VANCOCIN) 2,000 mg in sodium chloride 0.9 % 500 mL IVPB     2,000 mg 250 mL/hr over 120 Minutes Intravenous  Once 12/18/16 2325 12/19/16 0159       Subjective:  Having some spontaneous right abdominal pain this morning.  Left foot continues to hurt.    Objective: Vitals:   12/21/16 1847 12/21/16 2134 12/22/16 0544 12/22/16 0858  BP: 114/67 (!) 101/57 (!) 103/53 (!) 103/55  Pulse: (!) 51 (!) 50 (!) 53 (!) 54  Resp: Temp: 97.7 F (36.5 C) 97.7 F (36.5 C) 97.8 F (36.6 C) 98 F (36.7 C)  TempSrc: Oral Oral Oral Oral  SpO2: 100% 100% 97% 98%  Weight:      Height:        Intake/Output Summary (Last 24 hours) at 12/22/16 1652 Last data filed at 12/22/16 0400  Gross per 24 hour  Intake             2385 ml  Output                0 ml  Net             2385 ml   Filed Weights   12/19/16 0254 12/19/16 2212 12/21/16 0545  Weight: 102.5 kg (225 lb 15.5 oz) 103 kg (227 lb) 103 kg (227 lb)    Examination:  General exam:  Adult male, awake, alert and talkative but does not remember me from previous encounters HEENT:  NCAT, MMM Respiratory system:  No focal rales, rhonchi, or wheeze.  Diminished at the bilateral bases.   Cardiovascular system: Regular rate and rhythm, normal S1/S2. No murmurs, rubs, gallops or clicks.  Warm extremities Gastrointestinal system: Normal active bowel sounds, soft, moderate to severe distension and TTP in right upper and lower quadrants with edematous skin and fluid wave.    MSK:  Normal tone and bulk, 1+ pitting left lower extremity edema.  Right AKA.  Heel with a 1cm stage 2  ulcer without surrounding erythema or induration.  Plantar surface of foot with a  2cm stage 3 ulcer with a more central 1cm area that is about 1cm deep.  More discharge today compared to yesterday.  Very swollen on plantar surface.  Medial foot with 4 cm ulcer with healthy granulation tissue.   Neuro:  Grossly moves upper extremities but diffusely weak    Data Reviewed: I have personally reviewed following labs and imaging studies  CBC:  Recent Labs Lab 12/18/16 1954 12/18/16 2007 12/20/16 0726 12/20/16 0925 12/20/16 1605 12/21/16 0527  WBC 6.6  --  5.3 5.4  --  4.3  NEUTROABS 5.2  --   --   --  6.4  --  HGB 10.0* 10.5* 8.2* 8.3*  --  8.0*  HCT 29.0* 31.0* 24.1* 24.7*  --  24.4*  MCV 94.8  --  95.6 96.5  --  98.0  PLT 87*  --  58* 57*  --  46*   Basic Metabolic Panel:  Recent Labs Lab 12/19/16 0545 12/20/16 0726 12/20/16 0925 12/21/16 0527 12/22/16 0504  NA 131* 133* 133* 133*  133* 134*  K 5.2*  5.2* 4.0 4.2 4.2  4.2 4.2  CL 101 102 104 103  102 103  CO2 17* 23 22 21*  21* 22  GLUCOSE 83 134* 172* 162*  163* 133*  BUN 63* 68* 67* 64*  64* 59*  CREATININE 3.15* 3.33* 3.31* 3.22*  3.19* 2.91*  CALCIUM 8.4* 8.3* 8.2* 8.3*  8.3* 8.4*  PHOS  --  3.8 3.8 3.7  3.6 3.5   GFR: Estimated Creatinine Clearance: 31.5 mL/min (A) (by C-G formula based on SCr of 2.91 mg/dL (H)). Liver Function Tests:  Recent Labs Lab 12/18/16 1954 12/19/16 0545 12/20/16 0726 12/20/16 0925 12/21/16 0527 12/22/16 0504  AST 49* 43*  --   --  35  --   ALT 19 17  --   --  15*  --   ALKPHOS 72 63  --   --  48  --   BILITOT 2.6* 2.5*  --   --  1.6*  --   PROT 8.1 7.5  --   --  6.7  --   ALBUMIN 1.9* 1.6* 1.6* 1.6* 1.9*  1.9* 2.4*   No results for input(s): LIPASE, AMYLASE in the last 168 hours.  Recent Labs Lab 12/20/16 0726  AMMONIA 27   Coagulation Profile:  Recent Labs Lab 12/18/16 2348  INR 2.35   Cardiac Enzymes: No results for input(s): CKTOTAL, CKMB,  CKMBINDEX, TROPONINI in the last 168 hours. BNP (last 3 results) No results for input(s): PROBNP in the last 8760 hours. HbA1C: No results for input(s): HGBA1C in the last 72 hours. CBG:  Recent Labs Lab 12/21/16 0741 12/21/16 1641 12/21/16 2138 12/22/16 0815 12/22/16 1129  GLUCAP 140* 163* 172* 147* 140*   Lipid Profile: No results for input(s): CHOL, HDL, LDLCALC, TRIG, CHOLHDL, LDLDIRECT in the last 72 hours. Thyroid Function Tests: No results for input(s): TSH, T4TOTAL, FREET4, T3FREE, THYROIDAB in the last 72 hours. Anemia Panel: No results for input(s): VITAMINB12, FOLATE, FERRITIN, TIBC, IRON, RETICCTPCT in the last 72 hours. Urine analysis:    Component Value Date/Time   COLORURINE YELLOW 12/18/2016 2200   APPEARANCEUR HAZY (A) 12/18/2016 2200   LABSPEC 1.015 12/18/2016 2200   PHURINE 5.0 12/18/2016 2200   GLUCOSEU NEGATIVE 12/18/2016 2200   HGBUR SMALL (A) 12/18/2016 2200   BILIRUBINUR NEGATIVE 12/18/2016 2200   KETONESUR NEGATIVE 12/18/2016 2200   PROTEINUR 30 (A) 12/18/2016 2200   NITRITE NEGATIVE 12/18/2016 2200   LEUKOCYTESUR NEGATIVE 12/18/2016 2200   Sepsis Labs: (procalcitonin:4,lacticidven:4)  ) Recent Results (from the past 240 hour(s))  MRSA PCR Screening     Status: Abnormal   Collection Time: 12/19/16  1:24 AM  Result Value Ref Range Status   MRSA by PCR POSITIVE (A) NEGATIVE Final    Comment:        The GeneXpert MRSA Assay (FDA approved for NASAL specimens only), is one component of a comprehensive MRSA colonization surveillance program. It is not intended to diagnose MRSA infection nor to guide or monitor treatment for MRSA infections. RESULT CALLED TO, READ BACK BY AND VERIFIED WITH: C  DAVIS,RN  12/19/16 MKELLY,MLT   Gram stain     Status: None   Collection Time: 12/20/16  3:11 PM  Result Value Ref Range Status   Specimen Description FLUID PERITONEAL  Final   Special Requests NONE  Final   Gram Stain   Final     ABUNDANT WBC PRESENT,BOTH PMN AND MONONUCLEAR NO ORGANISMS SEEN    Report Status 12/20/2016 FINAL  Final  Culture, body fluid-bottle     Status: None (Preliminary result)   Collection Time: 12/20/16  3:11 PM  Result Value Ref Range Status   Specimen Description FLUID PERITONEAL  Final   Special Requests BOTTLES DRAWN AEROBIC AND ANAEROBIC 10CC  Final   Culture NO GROWTH 2 DAYS  Final   Report Status PENDING  Incomplete      Radiology Studies: Mr Foot Left Wo Contrast  Result Date: 12/22/2016 CLINICAL DATA:  Diabetic foot ulcer.  History of prior amputation. EXAM: MRI OF THE LEFT FOOT WITHOUT CONTRAST TECHNIQUE: Multiplanar, multisequence MR imaging of the MRI 10/24/2016 was performed. No intravenous contrast was administered. COMPARISON:  MRI 10/24/2016 FINDINGS: Bones/Joint/Cartilage Status post amputation of the first ray back to the proximal first metatarsal. Signal abnormality in the base of metatarsal is worrisome for osteomyelitis. There is also osteomyelitis involving the second metatarsal and proximal phalanx with marked subluxation and possible septic arthritis. There is also an open wound on the plantar aspect of the forefoot overlying the region of the second metatarsal head. Diffuse cellulitis and myofasciitis. The irregular fluid collection along the medial aspect of the second metatarsal and proximal phalanx suspicious for abscess. The The tibiotalar and subtalar joints are maintained. Small joint effusions. No findings for mid or hindfoot osteomyelitis. IMPRESSION: MR findings consistent with osteomyelitis involving the second metatarsal and second proximal phalanx with probable intervening septic arthritis. There is also cellulitis and myofasciitis and a small abscess along the medial aspect of the second ray. Status post amputation of the first metatarsal. Signal abnormality in the base of the metatarsal could be postsurgical or due to osteomyelitis. Electronically Signed   By: Rudie Meyer M.D.   On: 12/22/2016 16:33     Scheduled Meds: . albumin human  25 g Intravenous TID  . atorvastatin  40 mg Oral Daily  . brimonidine  1 drop Both Eyes Daily  . Chlorhexidine Gluconate Cloth  6 each Topical Q0600  . feeding supplement (ENSURE ENLIVE)  237 mL Oral BID BM  . feeding supplement (PRO-STAT SUGAR FREE 64)  30 mL Oral TID  . insulin aspart  0-5 Units Subcutaneous QHS  . insulin aspart  0-9 Units Subcutaneous TID WC  . lactulose  10 g Oral Daily  . midodrine  15 mg Oral TID WC  . multivitamin with minerals  1 tablet Oral Daily  . mupirocin ointment  1 application Nasal BID  . pantoprazole  40 mg Oral BID  . rifaximin  550 mg Oral BID  . saccharomyces boulardii  250 mg Oral BID  . sodium chloride flush  3 mL Intravenous Q12H  . sucralfate  1 g Oral TID AC & HS  . vancomycin  1,500 mg Intravenous Q48H   Continuous Infusions: . octreotide  (SANDOSTATIN)    IV infusion 50 mcg/hr (12/22/16 0742)     LOS: 4 days    Time spent: 30 min    Renae Fickle, MD [facility] Pager [phone]  If 7PM-7AM, please contact night-coverage [email or web address] Password [facility] 12/22/2016, 4:52 PM

## 2016-12-23 ENCOUNTER — Encounter (HOSPITAL_COMMUNITY)
Admission: EM | Disposition: A | Discharge status: Rehabilitation Facility | Payer: Self-pay | Source: Home / Self Care | Attending: Internal Medicine

## 2016-12-23 ENCOUNTER — Inpatient Hospital Stay (HOSPITAL_COMMUNITY): Payer: BC Managed Care – PPO | Admitting: Certified Registered Nurse Anesthetist

## 2016-12-23 ENCOUNTER — Encounter (HOSPITAL_COMMUNITY): Payer: Self-pay | Admitting: Certified Registered Nurse Anesthetist

## 2016-12-23 DIAGNOSIS — L02612 Cutaneous abscess of left foot: Secondary | ICD-10-CM

## 2016-12-23 DIAGNOSIS — M00072 Staphylococcal arthritis, left ankle and foot: Secondary | ICD-10-CM

## 2016-12-23 HISTORY — PX: AMPUTATION: SHX166

## 2016-12-23 LAB — GLUCOSE, CAPILLARY
GLUCOSE-CAPILLARY: 113 mg/dL — AB (ref 65–99)
GLUCOSE-CAPILLARY: 118 mg/dL — AB (ref 65–99)
Glucose-Capillary: 136 mg/dL — ABNORMAL HIGH (ref 65–99)
Glucose-Capillary: 151 mg/dL — ABNORMAL HIGH (ref 65–99)
Glucose-Capillary: 116 mg/dL — ABNORMAL HIGH (ref 65–99)

## 2016-12-23 LAB — RENAL FUNCTION PANEL
ALBUMIN: 2.5 g/dL — AB (ref 3.5–5.0)
ANION GAP: 9 (ref 5–15)
BUN: 54 mg/dL — ABNORMAL HIGH (ref 6–20)
CALCIUM: 8.6 mg/dL — AB (ref 8.9–10.3)
CO2: 23 mmol/L (ref 22–32)
Chloride: 104 mmol/L (ref 101–111)
Creatinine, Ser: 2.76 mg/dL — ABNORMAL HIGH (ref 0.61–1.24)
GFR calc Af Amer: 26 mL/min — ABNORMAL LOW (ref >60)
GFR, EST NON AFRICAN AMERICAN: 22 mL/min — AB (ref >60)
GLUCOSE: 139 mg/dL — AB (ref 65–99)
PHOSPHORUS: 3.2 mg/dL (ref 2.5–4.6)
POTASSIUM: 4.5 mmol/L (ref 3.5–5.1)
Sodium: 136 mmol/L (ref 135–145)

## 2016-12-23 LAB — CBC
HEMATOCRIT: 26.9 % — AB (ref 39.0–52.0)
Hemoglobin: 8.8 g/dL — ABNORMAL LOW (ref 13.0–17.0)
MCH: 32.7 pg (ref 26.0–34.0)
MCHC: 32.7 g/dL (ref 30.0–36.0)
MCV: 100 fL (ref 78.0–100.0)
RBC: 2.69 MIL/uL — ABNORMAL LOW (ref 4.22–5.81)
RDW: 19.4 % — AB (ref 11.5–15.5)
WBC: 5.7 10*3/uL (ref 4.0–10.5)

## 2016-12-23 LAB — VANCOMYCIN, RANDOM: VANCOMYCIN RM: 19

## 2016-12-23 LAB — PREPARE FRESH FROZEN PLASMA
Unit division: 0
Unit division: 0

## 2016-12-23 LAB — PROTIME-INR
INR: 2.62
Prothrombin Time: 28.5 seconds — ABNORMAL HIGH (ref 11.4–15.2)

## 2016-12-23 LAB — BPAM FFP
BLOOD PRODUCT EXPIRATION DATE: 201804092359
Blood Product Expiration Date: 201804092359
ISSUE DATE / TIME: 201804071209
ISSUE DATE / TIME: 201804071209
UNIT TYPE AND RH: 6200
Unit Type and Rh: 600

## 2016-12-23 LAB — ANTISTREPTOLYSIN O TITER: ASO: 798 [IU]/mL — AB (ref 0.0–200.0)

## 2016-12-23 LAB — PREPARE RBC (CROSSMATCH)

## 2016-12-23 SURGERY — AMPUTATION BELOW KNEE
Anesthesia: General | Site: Leg Lower | Laterality: Left

## 2016-12-23 MED ORDER — NEOSTIGMINE METHYLSULFATE 5 MG/5ML IV SOSY
PREFILLED_SYRINGE | INTRAVENOUS | Status: AC
  Filled 2016-12-23: qty 5

## 2016-12-23 MED ORDER — PHENYLEPHRINE 40 MCG/ML (10ML) SYRINGE FOR IV PUSH (FOR BLOOD PRESSURE SUPPORT)
PREFILLED_SYRINGE | INTRAVENOUS | Status: AC
  Filled 2016-12-23: qty 10

## 2016-12-23 MED ORDER — SODIUM CHLORIDE 0.9 % IV SOLN
Freq: Once | INTRAVENOUS | Status: DC
Start: 2016-12-23 — End: 2016-12-27

## 2016-12-23 MED ORDER — SODIUM CHLORIDE 0.9 % IV SOLN
Freq: Once | INTRAVENOUS | Status: AC
  Administered 2016-12-23 (×2): via INTRAVENOUS

## 2016-12-23 MED ORDER — METOCLOPRAMIDE HCL 5 MG PO TABS
5.0000 mg | ORAL_TABLET | Freq: Three times a day (TID) | ORAL | Status: DC | PRN
Start: 2016-12-23 — End: 2016-12-27

## 2016-12-23 MED ORDER — FENTANYL CITRATE (PF) 100 MCG/2ML IJ SOLN
INTRAMUSCULAR | Status: DC | PRN
  Administered 2016-12-23: 50 ug via INTRAVENOUS
  Administered 2016-12-23: 100 ug via INTRAVENOUS

## 2016-12-23 MED ORDER — MIDAZOLAM HCL 2 MG/2ML IJ SOLN
INTRAMUSCULAR | Status: AC
  Filled 2016-12-23: qty 2

## 2016-12-23 MED ORDER — SUCCINYLCHOLINE CHLORIDE 200 MG/10ML IV SOSY
PREFILLED_SYRINGE | INTRAVENOUS | Status: AC
  Filled 2016-12-23: qty 10

## 2016-12-23 MED ORDER — METOCLOPRAMIDE HCL 5 MG/ML IJ SOLN: 5.0000 mg | Freq: Three times a day (TID) | INTRAMUSCULAR | Status: DC | PRN

## 2016-12-23 MED ORDER — ROCURONIUM BROMIDE 50 MG/5ML IV SOSY
PREFILLED_SYRINGE | INTRAVENOUS | Status: AC
  Filled 2016-12-23: qty 5

## 2016-12-23 MED ORDER — OXYCODONE HCL 5 MG/5ML PO SOLN: 5.0000 mg | Freq: Once | ORAL | Status: DC | PRN

## 2016-12-23 MED ORDER — FENTANYL CITRATE (PF) 250 MCG/5ML IJ SOLN
INTRAMUSCULAR | Status: AC
  Filled 2016-12-23: qty 5

## 2016-12-23 MED ORDER — OXYCODONE HCL 5 MG PO TABS
5.0000 mg | ORAL_TABLET | ORAL | Status: DC | PRN
  Administered 2016-12-23 – 2016-12-24 (×2): 10 mg via ORAL
  Filled 2016-12-23 (×2): qty 2

## 2016-12-23 MED ORDER — ONDANSETRON HCL 4 MG/2ML IJ SOLN: 4.0000 mg | Freq: Once | INTRAMUSCULAR | Status: DC | PRN

## 2016-12-23 MED ORDER — OXYCODONE HCL 5 MG PO TABS: 5.0000 mg | ORAL_TABLET | Freq: Once | ORAL | Status: DC | PRN

## 2016-12-23 MED ORDER — ONDANSETRON HCL 4 MG PO TABS: 4.0000 mg | ORAL_TABLET | Freq: Four times a day (QID) | ORAL | Status: DC | PRN

## 2016-12-23 MED ORDER — LIDOCAINE HCL (CARDIAC) 20 MG/ML IV SOLN
INTRAVENOUS | Status: DC | PRN
  Administered 2016-12-23: 50 mg via INTRAVENOUS

## 2016-12-23 MED ORDER — PROPOFOL 10 MG/ML IV BOLUS
INTRAVENOUS | Status: DC | PRN
  Administered 2016-12-23: 120 mg via INTRAVENOUS

## 2016-12-23 MED ORDER — GLYCOPYRROLATE 0.2 MG/ML IJ SOLN
INTRAMUSCULAR | Status: DC | PRN
  Administered 2016-12-23: .5 mg via INTRAVENOUS

## 2016-12-23 MED ORDER — NEOSTIGMINE METHYLSULFATE 10 MG/10ML IV SOLN
INTRAVENOUS | Status: DC | PRN
  Administered 2016-12-23: 3.5 mg via INTRAVENOUS

## 2016-12-23 MED ORDER — CEFAZOLIN SODIUM 1 G IJ SOLR
INTRAMUSCULAR | Status: AC
  Filled 2016-12-23: qty 20

## 2016-12-23 MED ORDER — PROPOFOL 10 MG/ML IV BOLUS
INTRAVENOUS | Status: AC
  Filled 2016-12-23: qty 20

## 2016-12-23 MED ORDER — ACETAMINOPHEN 650 MG RE SUPP: 650.0000 mg | Freq: Four times a day (QID) | RECTAL | Status: DC | PRN

## 2016-12-23 MED ORDER — FENTANYL CITRATE (PF) 100 MCG/2ML IJ SOLN
25.0000 ug | INTRAMUSCULAR | Status: DC | PRN
  Administered 2016-12-23: 50 ug via INTRAVENOUS

## 2016-12-23 MED ORDER — LIDOCAINE 2% (20 MG/ML) 5 ML SYRINGE
INTRAMUSCULAR | Status: AC
  Filled 2016-12-23: qty 5

## 2016-12-23 MED ORDER — MORPHINE SULFATE (PF) 2 MG/ML IV SOLN: 2.0000 mg | INTRAVENOUS | Status: DC | PRN

## 2016-12-23 MED ORDER — ROCURONIUM BROMIDE 100 MG/10ML IV SOLN
INTRAVENOUS | Status: DC | PRN
  Administered 2016-12-23: 50 mg via INTRAVENOUS

## 2016-12-23 MED ORDER — ONDANSETRON HCL 4 MG/2ML IJ SOLN: 4.0000 mg | Freq: Four times a day (QID) | INTRAMUSCULAR | Status: DC | PRN

## 2016-12-23 MED ORDER — CEFAZOLIN SODIUM 1 G IJ SOLR
INTRAMUSCULAR | Status: DC | PRN
  Administered 2016-12-23: 2 g via INTRAMUSCULAR

## 2016-12-23 MED ORDER — SUFENTANIL CITRATE 50 MCG/ML IV SOLN
INTRAVENOUS | Status: AC
  Filled 2016-12-23: qty 1

## 2016-12-23 MED ORDER — ONDANSETRON HCL 4 MG/2ML IJ SOLN
INTRAMUSCULAR | Status: DC | PRN
  Administered 2016-12-23: 4 mg via INTRAVENOUS

## 2016-12-23 MED ORDER — ACETAMINOPHEN 325 MG PO TABS: 650.0000 mg | ORAL_TABLET | Freq: Four times a day (QID) | ORAL | Status: DC | PRN

## 2016-12-23 MED ORDER — 0.9 % SODIUM CHLORIDE (POUR BTL) OPTIME
TOPICAL | Status: DC | PRN
  Administered 2016-12-23: 1000 mL

## 2016-12-23 MED ORDER — ONDANSETRON HCL 4 MG/2ML IJ SOLN
INTRAMUSCULAR | Status: AC
  Filled 2016-12-23: qty 2

## 2016-12-23 MED ORDER — FENTANYL CITRATE (PF) 100 MCG/2ML IJ SOLN
INTRAMUSCULAR | Status: AC
  Administered 2016-12-23: 50 ug via INTRAVENOUS
  Filled 2016-12-23: qty 2

## 2016-12-23 SURGICAL SUPPLY — 53 items
BANDAGE ACE 4X5 VEL STRL LF (GAUZE/BANDAGES/DRESSINGS) ×3 IMPLANT
BANDAGE ACE 6X5 VEL STRL LF (GAUZE/BANDAGES/DRESSINGS) ×3 IMPLANT
BANDAGE ELASTIC 4 VELCRO ST LF (GAUZE/BANDAGES/DRESSINGS) ×3 IMPLANT
BANDAGE ELASTIC 6 VELCRO ST LF (GAUZE/BANDAGES/DRESSINGS) ×3 IMPLANT
BANDAGE ESMARK 6X9 LF (GAUZE/BANDAGES/DRESSINGS) IMPLANT
BLADE SAW SAG 29X58X.64 (BLADE) ×3 IMPLANT
BNDG COHESIVE 6X5 TAN STRL LF (GAUZE/BANDAGES/DRESSINGS) ×3 IMPLANT
BNDG ESMARK 6X9 LF (GAUZE/BANDAGES/DRESSINGS)
BNDG GAUZE ELAST 4 BULKY (GAUZE/BANDAGES/DRESSINGS) ×3 IMPLANT
CLEANER TIP ELECTROSURG 2X2 (MISCELLANEOUS) ×3 IMPLANT
COVER SURGICAL LIGHT HANDLE (MISCELLANEOUS) ×3 IMPLANT
CUFF TOURNIQUET SINGLE 34IN LL (TOURNIQUET CUFF) ×3 IMPLANT
CUFF TOURNIQUET SINGLE 44IN (TOURNIQUET CUFF) IMPLANT
DRAIN PENROSE 1/2X12 LTX STRL (WOUND CARE) ×6 IMPLANT
DRAPE HALF SHEET 40X57 (DRAPES) ×3 IMPLANT
DRAPE INCISE IOBAN 66X45 STRL (DRAPES) ×3 IMPLANT
DRAPE ORTHO SPLIT 77X108 STRL (DRAPES) ×2
DRAPE PROXIMA HALF (DRAPES) ×6 IMPLANT
DRAPE SURG ORHT 6 SPLT 77X108 (DRAPES) ×1 IMPLANT
DRAPE U-SHAPE 47X51 STRL (DRAPES) ×3 IMPLANT
DURAPREP 26ML APPLICATOR (WOUND CARE) ×3 IMPLANT
EVACUATOR 1/8 PVC DRAIN (DRAIN) IMPLANT
GAUZE SPONGE 4X4 12PLY STRL (GAUZE/BANDAGES/DRESSINGS) ×3 IMPLANT
GAUZE SPONGE 4X4 12PLY STRL LF (GAUZE/BANDAGES/DRESSINGS) ×3 IMPLANT
GAUZE XEROFORM 5X9 LF (GAUZE/BANDAGES/DRESSINGS) ×3 IMPLANT
GLOVE BIOGEL PI IND STRL 8 (GLOVE) ×2 IMPLANT
GLOVE BIOGEL PI INDICATOR 8 (GLOVE) ×4
GLOVE ORTHO TXT STRL SZ7.5 (GLOVE) ×6 IMPLANT
GOWN STRL REUS W/ TWL LRG LVL3 (GOWN DISPOSABLE) ×1 IMPLANT
GOWN STRL REUS W/ TWL XL LVL3 (GOWN DISPOSABLE) ×1 IMPLANT
GOWN STRL REUS W/TWL 2XL LVL3 (GOWN DISPOSABLE) ×3 IMPLANT
GOWN STRL REUS W/TWL LRG LVL3 (GOWN DISPOSABLE) ×2
GOWN STRL REUS W/TWL XL LVL3 (GOWN DISPOSABLE) ×2
KIT BASIN OR (CUSTOM PROCEDURE TRAY) ×3 IMPLANT
KIT ROOM TURNOVER OR (KITS) ×3 IMPLANT
MANIFOLD NEPTUNE II (INSTRUMENTS) ×3 IMPLANT
NEEDLE MAYO TROCAR (NEEDLE) ×3 IMPLANT
NS IRRIG 1000ML POUR BTL (IV SOLUTION) ×3 IMPLANT
PACK GENERAL/GYN (CUSTOM PROCEDURE TRAY) ×3 IMPLANT
PAD ARMBOARD 7.5X6 YLW CONV (MISCELLANEOUS) ×6 IMPLANT
SPONGE LAP 18X18 X RAY DECT (DISPOSABLE) IMPLANT
STAPLER VISISTAT 35W (STAPLE) ×3 IMPLANT
STOCKINETTE IMPERVIOUS LG (DRAPES) IMPLANT
SUT SILK 3 0 SH CR/8 (SUTURE) ×3 IMPLANT
SUT VIC AB 0 CT1 27 (SUTURE) ×2
SUT VIC AB 0 CT1 27XBRD ANBCTR (SUTURE) ×1 IMPLANT
SUT VIC AB 1 CTX 18 (SUTURE) ×3 IMPLANT
SUT VIC AB 2-0 CT1 18 (SUTURE) ×3 IMPLANT
SUT VICRYL 0 TIES 12 18 (SUTURE) ×3 IMPLANT
SUT VICRYL AB 2 0 TIES (SUTURE) ×3 IMPLANT
TOWEL OR 17X24 6PK STRL BLUE (TOWEL DISPOSABLE) ×3 IMPLANT
TOWEL OR 17X26 10 PK STRL BLUE (TOWEL DISPOSABLE) ×3 IMPLANT
WATER STERILE IRR 1000ML POUR (IV SOLUTION) ×3 IMPLANT

## 2016-12-23 NOTE — Transfer of Care (Signed)
Immediate Anesthesia Transfer of Care Note  Patient: Jeffery [name]  Procedure(s) Performed: Procedure(s): AMPUTATION BELOW KNEE (Left)  Patient Location: PACU  Anesthesia Type:General  Level of Consciousness: awake  Airway & Oxygen Therapy: Patient Spontanous Breathing and Patient connected to face mask oxygen  Post-op Assessment: Report given to RN and Post -op Vital signs reviewed and stable  Post vital signs: Reviewed and stable  Last Vitals:  Vitals:   12/23/16 0431 12/23/16 0841  BP: (!) 116/43 (!) 110/52  Pulse: (!) 57 (!) 56  Resp: 16 17  Temp: 36.7 C 36.6 C    Last Pain:  Vitals:   12/23/16 0841  TempSrc: Oral  PainSc:          Complications: No apparent anesthesia complications

## 2016-12-23 NOTE — Interval H&P Note (Signed)
History and Physical Interval Note:  12/23/2016 1:57 PM  Jeffery [name]  has presented today for surgery, with the diagnosis of foot osteomylitis  The various methods of treatment have been discussed with the patient and family. After consideration of risks, benefits and other options for treatment, the patient has consented to  Procedure(s): AMPUTATION BELOW KNEE (Left) as a surgical intervention .  The patient's history has been reviewed, patient examined, no change in status, stable for surgery.  I have reviewed the patient's chart and labs.  Questions were answered to the patient's satisfaction.     Eldred Manges

## 2016-12-23 NOTE — Progress Notes (Signed)
PROGRESS NOTE  Jeffery [name]  GNF:[id number] DOB: [birth year] DOA: 12/18/2016 PCP: Julian Hy, MD  Brief Narrative:   Jeffery Flurchickis a 66 y.o.malewith medical history significant for end-stage liver cirrhosis, insulin-dependent diabetes mellitus, and chronic kidney disease stage III who presented from his nursing facility for evaluation of nausea, vomiting, and generalized weakness.  Patient was admitted to the hospital in February 2018 with osteomyelitis involving the left foot;he underwent first ray amputation with clean margins reported and was discharged to a skilled nursing facility. A culture reportedly grew staph and he wasplaced on Bactrim empirically on 12/12/2016. Since that time, he developed nausea with nonbloody nonbilious vomiting and progressive weakness.  He was found to have AKI with a serum creatinine of 3.27 up from 1.6 g/dL in March.  This is likely multifactorial due to infection, bactrim, and recent paracentesis.  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  MRI confirmed osteomyelitis, abscess and septic arthritis of the left foot.  Orthopedics, Dr. Ophelia Charter, to perform BKA of left leg on 4/7.     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 Acute kidney injury superimposed on CKD stage III due to end-stage liver disease, bactrim, recent large volume paracentesis, and Staph infection of the left foot.   Creatinine trending down slightly today.   -  SCr is 3.27, up from 1.6 in March 2018  -  Bactrim discontinued at admission -  Albumin, octreotide and midodrine per nephrology -  C3 29 and C4 5, very low suggesting complement mediated nephropathy -  Source control as below  2. Osteomyelitis, septic arthritis, and abscess of left foot s/p first ray amputation -  Orthopedics assistance appreciated -  BKA today  3. Liver cirrhosis  - Has esophageal varices, grade I by EGD in February 2018  - Lasix and Aldactone held on admission in light of N/V with AKI  - Continue Protonix   Metabolic encephalopathy, suspect hepatic encephalopathy based on history despite normal ammonia level.  Improved with lactulose and rifaximin -  Continue lactulose and rifaximin -  US guided diagnostic paracentesis:  SBP unlikely given low WBC count -  Body fluid culture NGTD  4. Hyperkalemia, resolved with IVF and kayexelate  5. Hyponatremia, mild and asymptomatic in setting of cirrhosis  6. Normocytic anemia, thrombocytopenia due to cirrhosis - Both indices appear to be stable with no evidence for active blood loss  - Follow periodic CBC   7. Hypotension - improved with hydration  DVT prophylaxis:  Heparin  Code Status:  DNR Family Communication:  Patient alone  Disposition Plan:  BKA today.  Creatinine gradually improving.     Consultants:   Nephrology  Wound care  Procedures:  US guided paracentesis   Antimicrobials:  Anti-infectives    Start     Dose/Rate Route Frequency Ordered Stop   12/22/16 1715  [MAR Hold]  piperacillin-tazobactam (ZOSYN) IVPB 3.375 g     (  MAR Hold since 12/23/16 1150)   3.375 g 12.5 mL/hr over 240 Minutes Intravenous Every 8 hours 12/22/16 1704     12/21/16 0600  [MAR Hold]  vancomycin (VANCOCIN) 1,500 mg in sodium chloride 0.9 % 500 mL IVPB     (MAR Hold since 12/23/16 1150)   1,500 mg 250 mL/hr over 120 Minutes Intravenous Every 48 hours 12/18/16 2353      12/20/16 2200  [MAR Hold]  rifaximin (XIFAXAN) tablet 550 mg     (MAR Hold since 12/23/16 1150)   550 mg Oral 2 times daily 12/20/16 1801     12/18/16 2330  vancomycin (VANCOCIN) 2,000 mg in sodium chloride 0.9 % 500 mL IVPB     2,000 mg 250 mL/hr over 120 Minutes Intravenous  Once 12/18/16 2325 12/19/16 0159       Subjective:  Right abdominal wall still hurting.  Having nausea and vomited once yesterday, nonbilious, nonbloody.  Had one BM yesterday  Objective: Vitals:   12/22/16 1730 12/22/16 2331 12/23/16 0431 12/23/16 0841  BP: (!) 101/55 (!) 98/53 (!) 116/43 (!) 110/52  Pulse: (!) 44 (!) 48 (!) 57 (!) 56  Resp: Temp: 97.9 F (36.6 C) 97.7 F (36.5 C) 98 F (36.7 C) 97.9 F (36.6 C)  TempSrc: Oral Oral Oral Oral  SpO2: 97% 97% 97% 97%  Weight:      Height:        Intake/Output Summary (Last 24 hours) at 12/23/16 1524 Last data filed at 12/23/16 1430  Gross per 24 hour  Intake             1300 ml  Output             1460 ml  Net             -160 ml   Filed Weights   12/19/16 0254 12/19/16 2212 12/21/16 0545  Weight: 102.5 kg (225 lb 15.5 oz) 103 kg (227 lb) 103 kg (227 lb)    Examination:  General exam:  Adult male, awake HEENT:  NCAT, MMM Respiratory system:  No focal rales, rhonchi, or wheeze.  Diminished at the bilateral bases.   Cardiovascular system: Regular rate and rhythm, normal S1/S2. No murmurs, rubs, gallops or clicks.  Warm extremities Gastrointestinal system: Normal active bowel sounds, soft, moderate to severe distension and TTP in right upper and lower quadrants with edematous skin and fluid wave.    MSK:  Right AKA. 1+ pitting edema LLE.  Did not uncover dressing today on left foot as patient going to OR for BKA Neuro:  Grossly moves upper extremities but diffusely weak    Data Reviewed: I have personally reviewed following labs and imaging studies  CBC:  Recent Labs Lab 12/18/16 1954 12/18/16 2007 12/20/16 0726  12/20/16 0925 12/20/16 1605 12/21/16 0527 12/23/16 1034  WBC 6.6  --  5.3 5.4  --  4.3 5.7  NEUTROABS 5.2  --   --   --  6.4  --   --   HGB 10.0* 10.5* 8.2* 8.3*  --  8.0* 8.8*  HCT 29.0* 31.0* 24.1* 24.7*  --  24.4* 26.9*  MCV 94.8  --  95.6 96.5  --  98.0 100.0  PLT 87*  --  58* 57*  --  46* REPEATED TO VERIFY   Basic Metabolic Panel:  Recent Labs Lab 12/20/16 0726 12/20/16 0925 12/21/16 0527 12/22/16 0504 12/23/16 0149  NA 133* 133* 133*  133* 134* 136  K 4.0 4.2 4.2  4.2 4.2 4.5  CL 102 104 103  102 103 104  CO2 23 22 21*  21* 22 23  GLUCOSE 134* 172* 162*  163* 133* 139*  BUN 68* 67* 64*  64* 59* 54*  CREATININE 3.33* 3.31* 3.22*  3.19* 2.91* 2.76*  CALCIUM 8.3* 8.2* 8.3*  8.3* 8.4* 8.6*  PHOS 3.8 3.8 3.7  3.6 3.5 3.2   GFR: Estimated Creatinine Clearance: 33.2 mL/min (A) (by C-G formula based on SCr of 2.76 mg/dL (H)). Liver Function Tests:  Recent Labs Lab 12/18/16 1954 12/19/16 0545 12/20/16 0726 12/20/16 0925 12/21/16 0527 12/22/16 0504 12/23/16 0149  AST 49* 43*  --   --  35  --   --   ALT 19 17  --   --  15*  --   --   ALKPHOS 72 63  --   --  48  --   --   BILITOT 2.6* 2.5*  --   --  1.6*  --   --   PROT 8.1 7.5  --   --  6.7  --   --   ALBUMIN 1.9* 1.6* 1.6* 1.6* 1.9*  1.9* 2.4* 2.5*   No results for input(s): LIPASE, AMYLASE in the last 168 hours.  Recent Labs Lab 12/20/16 0726  AMMONIA 27   Coagulation Profile:  Recent Labs Lab 12/18/16 2348 12/23/16 1034  INR 2.35 2.62   Cardiac Enzymes: No results for input(s): CKTOTAL, CKMB, CKMBINDEX, TROPONINI in the last 168 hours. BNP (last 3 results) No results for input(s): PROBNP in the last 8760 hours. HbA1C: No results for input(s): HGBA1C in the last 72 hours. CBG:  Recent Labs Lab 12/22/16 1652 12/22/16 2328 12/23/16 0451 12/23/16 0838 12/23/16 1159  GLUCAP 135* 153* 113* 136* 151*   Lipid Profile: No results for input(s): CHOL, HDL, LDLCALC, TRIG, CHOLHDL,  LDLDIRECT in the last 72 hours. Thyroid Function Tests: No results for input(s): TSH, T4TOTAL, FREET4, T3FREE, THYROIDAB in the last 72 hours. Anemia Panel: No results for input(s): VITAMINB12, FOLATE, FERRITIN, TIBC, IRON, RETICCTPCT in the last 72 hours. Urine analysis:    Component Value Date/Time   COLORURINE YELLOW 12/18/2016 2200   APPEARANCEUR HAZY (A) 12/18/2016 2200   LABSPEC 1.015 12/18/2016 2200   PHURINE 5.0 12/18/2016 2200   GLUCOSEU NEGATIVE 12/18/2016 2200   HGBUR SMALL (A) 12/18/2016 2200   BILIRUBINUR NEGATIVE 12/18/2016 2200   KETONESUR NEGATIVE 12/18/2016 2200   PROTEINUR 30 (A) 12/18/2016 2200   NITRITE NEGATIVE 12/18/2016 2200   LEUKOCYTESUR NEGATIVE 12/18/2016 2200   Sepsis Labs: (procalcitonin:4,lacticidven:4)  ) Recent Results (from the past 240 hour(s))  MRSA PCR Screening     Status: Abnormal   Collection Time: 12/19/16  1:24 AM  Result Value Ref Range Status   MRSA by PCR POSITIVE (A) NEGATIVE Final    Comment:        The GeneXpert MRSA Assay (FDA approved for NASAL specimens only), is one component of a comprehensive MRSA colonization surveillance program. It is not intended to diagnose MRSA infection nor to guide or monitor treatment for MRSA infections. RESULT CALLED TO, READ BACK BY AND VERIFIED WITH: C DAVIS,RN  12/19/16 MKELLY,MLT   Gram stain     Status: None   Collection Time: 12/20/16  3:11 PM  Result Value Ref Range Status   Specimen Description FLUID PERITONEAL  Final   Special Requests NONE  Final   Gram Stain   Final    ABUNDANT WBC  PRESENT,BOTH PMN AND MONONUCLEAR NO ORGANISMS SEEN    Report Status 12/20/2016 FINAL  Final  Culture, body fluid-bottle     Status: None (Preliminary result)   Collection Time: 12/20/16  3:11 PM  Result Value Ref Range Status   Specimen Description FLUID PERITONEAL  Final   Special Requests BOTTLES DRAWN AEROBIC AND ANAEROBIC 10CC  Final   Culture NO GROWTH 2 DAYS  Final    Report Status PENDING  Incomplete      Radiology Studies: Mr Foot Left Wo Contrast  Result Date: 12/22/2016 CLINICAL DATA:  Diabetic foot ulcer.  History of prior amputation. EXAM: MRI OF THE LEFT FOOT WITHOUT CONTRAST TECHNIQUE: Multiplanar, multisequence MR imaging of the MRI 10/24/2016 was performed. No intravenous contrast was administered. COMPARISON:  MRI 10/24/2016 FINDINGS: Bones/Joint/Cartilage Status post amputation of the first ray back to the proximal first metatarsal. Signal abnormality in the base of metatarsal is worrisome for osteomyelitis. There is also osteomyelitis involving the second metatarsal and proximal phalanx with marked subluxation and possible septic arthritis. There is also an open wound on the plantar aspect of the forefoot overlying the region of the second metatarsal head. Diffuse cellulitis and myofasciitis. The irregular fluid collection along the medial aspect of the second metatarsal and proximal phalanx suspicious for abscess. The The tibiotalar and subtalar joints are maintained. Small joint effusions. No findings for mid or hindfoot osteomyelitis. IMPRESSION: MR findings consistent with osteomyelitis involving the second metatarsal and second proximal phalanx with probable intervening septic arthritis. There is also cellulitis and myofasciitis and a small abscess along the medial aspect of the second ray. Status post amputation of the first metatarsal. Signal abnormality in the base of the metatarsal could be postsurgical or due to osteomyelitis. Electronically Signed   By: Rudie Meyer M.D.   On: 12/22/2016 16:33     Scheduled Meds: . [MAR Hold] atorvastatin  40 mg Oral Daily  . [MAR Hold] brimonidine  1 drop Both Eyes Daily  . [MAR Hold] Chlorhexidine Gluconate Cloth  6 each Topical Q0600  . [MAR Hold] feeding supplement (ENSURE ENLIVE)  237 mL Oral BID BM  . [MAR Hold] feeding supplement (PRO-STAT SUGAR FREE 64)  30 mL Oral TID  . [MAR Hold] heparin  subcutaneous  5,000 Units Subcutaneous Q8H  . [MAR Hold] insulin aspart  0-5 Units Subcutaneous QHS  . [MAR Hold] insulin aspart  0-9 Units Subcutaneous TID WC  . [MAR Hold] lactulose  10 g Oral Daily  . [MAR Hold] midodrine  15 mg Oral TID WC  . [MAR Hold] multivitamin with minerals  1 tablet Oral Daily  . [MAR Hold] mupirocin ointment  1 application Nasal BID  . [MAR Hold] pantoprazole  40 mg Oral BID  . [MAR Hold] piperacillin-tazobactam (ZOSYN)  IV  3.375 g Intravenous Q8H  . [MAR Hold] rifaximin  550 mg Oral BID  . [MAR Hold] saccharomyces boulardii  250 mg Oral BID  . [MAR Hold] sodium chloride flush  3 mL Intravenous Q12H  . [MAR Hold] sucralfate  1 g Oral TID AC & HS  . [MAR Hold] vancomycin  1,500 mg Intravenous Q48H   Continuous Infusions:    LOS: 5 days    Time spent: 30 min    Renae Fickle, MD [facility] Pager [phone]  If 7PM-7AM, please contact night-coverage [email or web address] Password [email or web address] 12/23/2016, 3:24 PM

## 2016-12-23 NOTE — Progress Notes (Addendum)
Pharmacy Antibiotic Note  Garek Schuneman is a 66 y.o. male admitted on 12/18/2016 with weakness/ARF, left foot wound on Bactrim at nursing home PTA.  Pharmacy has been consulted for Vancomycin and zosyn dosing.   MRI of foot reveals confirmed osteomyelitis, cellulitis, myofasciitis, and small abscess on L foot.  AoCKD, SCr improving to 2.76 with eCrCl ~25 ml/min (normalized for weight) Vancomycin random drawn 4/7 @ ~0200 = 19. This was drawn too early to be considered a true trough, however trough likely therapeutic. Goal vanc trough = 15-20.   Plan: Continue Zosyn 3.375g IV q8h (4h infusion) Continue Vancomycin 1500 mg IV q48h Vancomycin levels as needed F/U renal function, clinical progression, LOT  Height:  (185.4 cm) Weight: 227 lb (103 kg) IBW/kg (Calculated) : 79.9  Temp (24hrs), Avg:97.9 F (36.6 C), Min:97.7 F (36.5 C), Max:98 F (36.7 C)   Recent Labs Lab 12/18/16 1954  12/20/16 0726 12/20/16 0925 12/21/16 0527 12/22/16 0504 12/23/16 0149  WBC 6.6  --  5.3 5.4 4.3  --   --   CREATININE 3.27*  < > 3.33* 3.31* 3.22*  3.19* 2.91* 2.76*  VANCORANDOM  --   --   --   --   --   --  19  < > = values in this interval not displayed.  Estimated Creatinine Clearance: 33.2 mL/min (A) (by C-G formula based on SCr of 2.76 mg/dL (H)).    No Known Allergies  Vanc 4/2 >> Zosyn 4/6 >>  Outpt cx: stated to be growing Staph Aureus (no sens listed - presumed MRSA) 4/3 MRSA PCR: positive 4/4 peritoneal fluid cx: ngtd x2d  Allena Katz, Pharm.D. PGY1 Pharmacy Resident 4/7/20187:30 AM Pager [phone]

## 2016-12-23 NOTE — Op Note (Signed)
Preop diagnosis: Left grade 4 Wagner ulcer with foot osteomyelitis and pyarthrosis.  Postop diagnosis: Same  Procedure: Left below-knee amputation  Surgeon Annell Greening M.D.  Anesthesia Gen.  Tourniquet less than 30 minutes  Procedure after standard prepping and draping proximal thigh tourniquet patient had his foot wrapped with the [place] up to the ankle. It had previous redictation in the past and had a diabetic ulcer after first ray amputation that had broken with the foul draining purulent material growing MRSA. He's had progressive renal decline and now is ready to proceed with the below-knee amputation.  After standard prepping and draping timeout procedure patient had Ancef E darty been getting vancomycin and also some Zosyn in the past since his hospitalization. Sterile skin marker was used for a posterior flap of 5-7 thing respiratory the tibial tubercle. Transverse incision was made after elevation of the leg tourniquet inflation posterior flap was developed sterile nerve was cut on stretching under tract lesser saphenous vein was cut large medial saphenous vein was tied off proximally coagulated distally. Anterior muscles were divided Coker Kelly clamp was placed behind the tibia cut with an oscillating saw on the anterior edge was beveled. There was cut with a bone biter 2 cm proximal. Posterior flap was thinned in the gastrocsoleus flap was ready to be brought forward. There is were cut on stretch suture ligation of arteries transferred either coagulated or tied off. Tract was deflated hemostasis obtained a few small tiny arteries were coagulated. Copious irrigation followed by closure of the gastrocsoleus over the front to the periosteum of the tibia with #1 Vicryl sutures. 0 Vicryl in the fascia 201 subtendinous tissue skin staple closure postop dressing and transferred recovery

## 2016-12-23 NOTE — Anesthesia Procedure Notes (Signed)
Procedure Name: Intubation Date/Time: 12/23/2016 2:26 PM Performed by: Geraldo Docker Pre-anesthesia Checklist: Patient identified, Patient being monitored, Timeout performed, Emergency Drugs available and Suction available Patient Re-evaluated:Patient Re-evaluated prior to inductionOxygen Delivery Method: Circle System Utilized Preoxygenation: Pre-oxygenation with 100% oxygen Intubation Type: IV induction Ventilation: Oral airway inserted - appropriate to patient size and Two handed mask ventilation required Laryngoscope Size: Miller and 3 Grade View: Grade I Tube type: Oral Tube size: 8.0 mm Number of attempts: 1 Airway Equipment and Method: Stylet Placement Confirmation: ETT inserted through vocal cords under direct vision,  positive ETCO2 and breath sounds checked- equal and bilateral Secured at: 23 cm Tube secured with: Tape Dental Injury: Teeth and Oropharynx as per pre-operative assessment

## 2016-12-23 NOTE — H&P (View-Only) (Signed)
   Subjective:    continued drainage of diabetic foot with MRI left foot osteo and pyarthrosis   Objective: Vital signs in last 24 hours: Temp:  [97.7 F (36.5 C)-98 F (36.7 C)] 97.9 F (36.6 C) (04/07 0841) Pulse Rate:  [44-57] 56 (04/07 0841) Resp:  [16-18] 17 (04/07 0841) BP: (98-116)/(43-55) 110/52 (04/07 0841) SpO2:  [97 %] 97 % (04/07 0841)  Intake/Output from previous day: 04/06 0701 - 04/07 0700 In: 930 [P.O.:480; I.V.:200; IV Piggyback:250] Out: 1260 [Urine:1260] Intake/Output this shift: Total I/O In: 240 [P.O.:240] Out: 200 [Urine:200]   Recent Labs  12/21/16 0527  HGB 8.0*    Recent Labs  12/21/16 0527  WBC 4.3  RBC 2.49*  HCT 24.4*  PLT 46*    Recent Labs  12/22/16 0504 12/23/16 0149  NA 134* 136  K 4.2 4.5  CL 103 104  CO2 22 23  BUN 59* 54*  CREATININE 2.91* 2.76*  GLUCOSE 133* 139*  CALCIUM 8.4* 8.6*   No results for input(s): LABPT, INR in the last 72 hours.  grade 4 Wagner ulcers left foot . swollen foot.  Mr Foot Left Wo Contrast  Result Date: 12/22/2016 CLINICAL DATA:  Diabetic foot ulcer.  History of prior amputation. EXAM: MRI OF THE LEFT FOOT WITHOUT CONTRAST TECHNIQUE: Multiplanar, multisequence MR imaging of the MRI 10/24/2016 was performed. No intravenous contrast was administered. COMPARISON:  MRI 10/24/2016 FINDINGS: Bones/Joint/Cartilage Status post amputation of the first ray back to the proximal first metatarsal. Signal abnormality in the base of metatarsal is worrisome for osteomyelitis. There is also osteomyelitis involving the second metatarsal and proximal phalanx with marked subluxation and possible septic arthritis. There is also an open wound on the plantar aspect of the forefoot overlying the region of the second metatarsal head. Diffuse cellulitis and myofasciitis. The irregular fluid collection along the medial aspect of the second metatarsal and proximal phalanx suspicious for abscess. The The tibiotalar and subtalar  joints are maintained. Small joint effusions. No findings for mid or hindfoot osteomyelitis. IMPRESSION: MR findings consistent with osteomyelitis involving the second metatarsal and second proximal phalanx with probable intervening septic arthritis. There is also cellulitis and myofasciitis and a small abscess along the medial aspect of the second ray. Status post amputation of the first metatarsal. Signal abnormality in the base of the metatarsal could be postsurgical or due to osteomyelitis. Electronically Signed   By: P.  Gallerani M.D.   On: 12/22/2016 16:33    Assessment/Plan:    Will proceed with BKA left leg. We had tried to save his foot but 2 areas are breaking down with osteo 2nd MT and continued renal problems likely from ongoing infection per Neprology team. Will proceed with BKA today left leg.   Jeffery [name] 12/23/2016, 10:01 AM      

## 2016-12-23 NOTE — Brief Op Note (Signed)
12/18/2016 - 12/23/2016  3:29 PM  PATIENT:  Jeffery [name]  66 y.o. male  PRE-OPERATIVE DIAGNOSIS:  foot osteomylitis  POST-OPERATIVE DIAGNOSIS:  foot osteomylitis  PROCEDURE:  Procedure(s): AMPUTATION BELOW KNEE (Left)  SURGEON:  Surgeon(s) and Role:    * Eldred Manges, MD - Primary  PHYSICIAN ASSISTANT:   ASSISTANTS: none   ANESTHESIA:   general  EBL:  Total I/O In: 1360 [P.O.:240; I.V.:750; Blood:370] Out: 350 [Urine:325; Blood:25]  BLOOD ADMINISTERED:none platelets,  FFP to go in PACU possibly  DRAINS: none   LOCAL MEDICATIONS USED:  NONE  SPECIMEN:  Source of Specimen:  foot  DISPOSITION OF SPECIMEN:  PATHOLOGY  COUNTS:  YES  TOURNIQUET:   Total Tourniquet Time Documented: Thigh (Left) - 18 minutes Total: Thigh (Left) - 18 minutes   DICTATION: .Reubin Milan Dictation  PLAN OF CARE: Already inpatient  PATIENT DISPOSITION:  PACU - hemodynamically stable.   Delay start of Pharmacological VTE agent (>24hrs) due to surgical blood loss or risk of bleeding: not applicable

## 2016-12-23 NOTE — Anesthesia Preprocedure Evaluation (Signed)

## 2016-12-23 NOTE — Progress Notes (Signed)
   Subjective:    continued drainage of diabetic foot with MRI left foot osteo and pyarthrosis   Objective: Vital signs in last 24 hours: Temp:  [97.7 F (36.5 C)-98 F (36.7 C)] 97.9 F (36.6 C) (04/07 0841) Pulse Rate:  [44-57] 56 (04/07 0841) Resp:  [16-18] 17 (04/07 0841) BP: (98-116)/(43-55) 110/52 (04/07 0841) SpO2:  [97 %] 97 % (04/07 0841)  Intake/Output from previous day: 04/06 0701 - 04/07 0700 In: 930 [P.O.:480; I.V.:200; IV Piggyback:250] Out: 1260 [Urine:1260] Intake/Output this shift: Total I/O In: 240 [P.O.:240] Out: 200 [Urine:200]   Recent Labs  12/21/16 0527  HGB 8.0*    Recent Labs  12/21/16 0527  WBC 4.3  RBC 2.49*  HCT 24.4*  PLT 46*    Recent Labs  12/22/16 0504 12/23/16 0149  NA 134* 136  K 4.2 4.5  CL 103 104  CO2 22 23  BUN 59* 54*  CREATININE 2.91* 2.76*  GLUCOSE 133* 139*  CALCIUM 8.4* 8.6*   No results for input(s): LABPT, INR in the last 72 hours.  grade 4 Wagner ulcers left foot . swollen foot.  Jeffery [name]  Result Date: 12/22/2016 CLINICAL DATA:  Diabetic foot ulcer.  History of prior amputation. EXAM: MRI OF THE LEFT FOOT WITHOUT [name] TECHNIQUE: Multiplanar, multisequence Jeffery imaging of the MRI 10/24/2016 was performed. No intravenous [name] was administered. COMPARISON:  MRI 10/24/2016 FINDINGS: Bones/Joint/Cartilage Status post amputation of the first ray back to the proximal first metatarsal. Signal abnormality in the base of metatarsal is worrisome for osteomyelitis. There is also osteomyelitis involving the second metatarsal and proximal phalanx with marked subluxation and possible septic arthritis. There is also an open wound on the plantar aspect of the forefoot overlying the region of the second metatarsal head. Diffuse cellulitis and myofasciitis. The irregular fluid collection along the medial aspect of the second metatarsal and proximal phalanx suspicious for abscess. The The tibiotalar and subtalar  joints are maintained. Small joint effusions. No findings for mid or hindfoot osteomyelitis. IMPRESSION: Jeffery findings consistent with osteomyelitis involving the second metatarsal and second proximal phalanx with probable intervening septic arthritis. There is also cellulitis and myofasciitis and a small abscess along the medial aspect of the second ray. Status post amputation of the first metatarsal. Signal abnormality in the base of the metatarsal could be postsurgical or due to osteomyelitis. Electronically Signed   By: Rudie Meyer M.D.   On: 12/22/2016 16:33    Assessment/Plan:    Will proceed with BKA left leg. We had tried to save his foot but 2 areas are breaking down with osteo 2nd MT and continued renal problems likely from ongoing infection per Neprology team. Will proceed with BKA today left leg.   Jeffery [name] 12/23/2016, 10:01 AM

## 2016-12-23 NOTE — Anesthesia Postprocedure Evaluation (Signed)
Anesthesia Post Note  Patient: Jeffery [name]  Procedure(s) Performed: Procedure(s) (LRB): AMPUTATION BELOW KNEE (Left)  Patient location during evaluation: PACU Anesthesia Type: General Level of consciousness: awake, awake and alert and oriented Pain management: pain level controlled Vital Signs Assessment: post-procedure vital signs reviewed and stable Respiratory status: spontaneous breathing, nonlabored ventilation and respiratory function stable Cardiovascular status: blood pressure returned to baseline Anesthetic complications: no       Last Vitals:  Vitals:   12/23/16 1745 12/23/16 1842  BP:  (!) 113/50  Pulse:  (!) 51  Resp: 16 16  Temp:  36.4 C    Last Pain:  Vitals:   12/23/16 1842  TempSrc: Oral  PainSc:                  [name]

## 2016-12-23 NOTE — Progress Notes (Signed)
Subjective: Interval History: Ready to go ahead with amp  Objective: Vital signs in last 24 hours: Temp:  [97.7 F (36.5 C)-98 F (36.7 C)] 97.9 F (36.6 C) (04/07 0841) Pulse Rate:  [44-57] 56 (04/07 0841) Resp:  [16-18] 17 (04/07 0841) BP: (98-116)/(43-55) 110/52 (04/07 0841) SpO2:  [97 %] 97 % (04/07 0841) Weight change:   Intake/Output from previous day: 04/06 0701 - 04/07 0700 In: 930 [P.O.:480; I.V.:200; IV Piggyback:250] Out: 1260 [Urine:1260] Intake/Output this shift: Total I/O In: 240 [P.O.:240] Out: 200 [Urine:200]  General appearance: alert, cooperative, no distress and pale Resp: diminished breath sounds bilaterally and rales bibasilar Cardio: S1, S2 normal and systolic murmur: holosystolic 2/6, blowing at apex GI: pose FW, pos bs, distended Extremities: edema 3+ and RAKA, Lfoot bandaged,   Lab Results:  Recent Labs  12/21/16 0527 12/23/16 1034  WBC 4.3 5.7  HGB 8.0* 8.8*  HCT 24.4* 26.9*  PLT 46* REPEATED TO VERIFY   BMET:  Recent Labs  12/22/16 0504 12/23/16 0149  NA 134* 136  K 4.2 4.5  CL 103 104  CO2 22 23  GLUCOSE 133* 139*  BUN 59* 54*  CREATININE 2.91* 2.76*  CALCIUM 8.4* 8.6*   No results for input(s): PTH in the last 72 hours. Iron Studies: No results for input(s): IRON, TIBC, TRANSFERRIN, FERRITIN in the last 72 hours.  Studies/Results: Mr Foot Left Wo Contrast  Result Date: 12/22/2016 CLINICAL DATA:  Diabetic foot ulcer.  History of prior amputation. EXAM: MRI OF THE LEFT FOOT WITHOUT CONTRAST TECHNIQUE: Multiplanar, multisequence MR imaging of the MRI 10/24/2016 was performed. No intravenous contrast was administered. COMPARISON:  MRI 10/24/2016 FINDINGS: Bones/Joint/Cartilage Status post amputation of the first ray back to the proximal first metatarsal. Signal abnormality in the base of metatarsal is worrisome for osteomyelitis. There is also osteomyelitis involving the second metatarsal and proximal phalanx with marked subluxation  and possible septic arthritis. There is also an open wound on the plantar aspect of the forefoot overlying the region of the second metatarsal head. Diffuse cellulitis and myofasciitis. The irregular fluid collection along the medial aspect of the second metatarsal and proximal phalanx suspicious for abscess. The The tibiotalar and subtalar joints are maintained. Small joint effusions. No findings for mid or hindfoot osteomyelitis. IMPRESSION: MR findings consistent with osteomyelitis involving the second metatarsal and second proximal phalanx with probable intervening septic arthritis. There is also cellulitis and myofasciitis and a small abscess along the medial aspect of the second ray. Status post amputation of the first metatarsal. Signal abnormality in the base of the metatarsal could be postsurgical or due to osteomyelitis. Electronically Signed   By: Rudie Meyer M.D.   On: 12/22/2016 16:33    I have reviewed the patient's current medications.  Assessment/Plan: 1 AKI septra/AGN with staph.  Slowly better.  Amp may help. Will stop alb, oct 2 DM controlled 3 Cirrhosis 4 Foot osteo/abscess, for BKA 5 Anemia P stop alb, oct, BKA follow chem    LOS: 5 days   Jeffery [name] L 12/23/2016,11:24 AM

## 2016-12-24 ENCOUNTER — Encounter (HOSPITAL_COMMUNITY): Payer: Self-pay | Admitting: Orthopaedic Surgery

## 2016-12-24 LAB — RENAL FUNCTION PANEL
Albumin: 2.2 g/dL — ABNORMAL LOW (ref 3.5–5.0)
Anion gap: 10 (ref 5–15)
BUN: 49 mg/dL — AB (ref 6–20)
CALCIUM: 8.3 mg/dL — AB (ref 8.9–10.3)
CHLORIDE: 106 mmol/L (ref 101–111)
CO2: 19 mmol/L — AB (ref 22–32)
CREATININE: 2.51 mg/dL — AB (ref 0.61–1.24)
GFR, EST AFRICAN AMERICAN: 29 mL/min — AB (ref >60)
GFR, EST NON AFRICAN AMERICAN: 25 mL/min — AB (ref >60)
Glucose, Bld: 107 mg/dL — ABNORMAL HIGH (ref 65–99)
Phosphorus: 3.4 mg/dL (ref 2.5–4.6)
Potassium: 4.5 mmol/L (ref 3.5–5.1)
SODIUM: 135 mmol/L (ref 135–145)

## 2016-12-24 LAB — CBC
HCT: 26.4 % — ABNORMAL LOW (ref 39.0–52.0)
Hemoglobin: 8.6 g/dL — ABNORMAL LOW (ref 13.0–17.0)
MCH: 32.5 pg (ref 26.0–34.0)
MCHC: 32.6 g/dL (ref 30.0–36.0)
MCV: 99.6 fL (ref 78.0–100.0)
PLATELETS: 84 10*3/uL — AB (ref 150–400)
RBC: 2.65 MIL/uL — ABNORMAL LOW (ref 4.22–5.81)
RDW: 19.6 % — AB (ref 11.5–15.5)
WBC: 5.1 10*3/uL (ref 4.0–10.5)

## 2016-12-24 LAB — BPAM PLATELET PHERESIS
BLOOD PRODUCT EXPIRATION DATE: 201804072359
ISSUE DATE / TIME: 201804071208
UNIT TYPE AND RH: 600

## 2016-12-24 LAB — GLUCOSE, CAPILLARY
GLUCOSE-CAPILLARY: 92 mg/dL (ref 65–99)
GLUCOSE-CAPILLARY: 98 mg/dL (ref 65–99)
Glucose-Capillary: 95 mg/dL (ref 65–99)
Glucose-Capillary: 106 mg/dL — ABNORMAL HIGH (ref 65–99)

## 2016-12-24 LAB — PREPARE PLATELET PHERESIS: Unit division: 0

## 2016-12-24 MED ORDER — SODIUM BICARBONATE 650 MG PO TABS
1300.0000 mg | ORAL_TABLET | Freq: Two times a day (BID) | ORAL | Status: DC
  Administered 2016-12-24 (×2): 1300 mg via ORAL
  Filled 2016-12-24 (×2): qty 2

## 2016-12-24 MED ORDER — LACTULOSE 10 GM/15ML PO SOLN
10.0000 g | Freq: Three times a day (TID) | ORAL | Status: DC
  Administered 2016-12-24 – 2016-12-27 (×9): 10 g via ORAL
  Filled 2016-12-24 (×9): qty 15

## 2016-12-24 NOTE — Progress Notes (Signed)
Subjective: Interval History: has complaints lethargy.  Objective: Vital signs in last 24 hours: Temp:  [97.2 F (36.2 C)-98.5 F (36.9 C)] 97.8 F (36.6 C) (04/08 0952) Pulse Rate:  [50-68] 64 (04/08 0952) Resp:  [14-23] 16 (04/08 0952) BP: (104-122)/(50-90) 107/71 (04/08 0952) SpO2:  [95 %-99 %] 95 % (04/08 0952) Weight:  [110.1 kg (242 lb 11.2 oz)] 110.1 kg (242 lb 11.2 oz) (04/07 2104) Weight change:   Intake/Output from previous day: 04/07 0701 - 04/08 0700 In: 1810 [P.O.:240; I.V.:1000; Blood:370] Out: 450 [Urine:425; Blood:25] Intake/Output this shift: No intake/output data recorded.  General appearance: cooperative, no distress, pale and slowed mentation Resp: diminished breath sounds bilaterally and rales bibasilar Cardio: S1, S2 normal and systolic murmur: holosystolic 2/6, blowing at apex GI: pos FW, distended, soft, mild tender Extremities: edema 2+ and old R BK, new L  Lab Results:  Recent Labs  12/23/16 1034 12/24/16 0348  WBC 5.7 5.1  HGB 8.8* 8.6*  HCT 26.9* 26.4*  PLT REPEATED TO VERIFY 84*   BMET:  Recent Labs  12/23/16 0149 12/24/16 0348  NA 136 135  K 4.5 4.5  CL 104 106  CO2 23 19*  GLUCOSE 139* 107*  BUN 54* 49*  CREATININE 2.76* 2.51*  CALCIUM 8.6* 8.3*   No results for input(s): PTH in the last 72 hours. Iron Studies: No results for input(s): IRON, TIBC, TRANSFERRIN, FERRITIN in the last 72 hours.  Studies/Results: Mr Foot Left Wo Contrast  Result Date: 12/22/2016 CLINICAL DATA:  Diabetic foot ulcer.  History of prior amputation. EXAM: MRI OF THE LEFT FOOT WITHOUT CONTRAST TECHNIQUE: Multiplanar, multisequence MR imaging of the MRI 10/24/2016 was performed. No intravenous contrast was administered. COMPARISON:  MRI 10/24/2016 FINDINGS: Bones/Joint/Cartilage Status post amputation of the first ray back to the proximal first metatarsal. Signal abnormality in the base of metatarsal is worrisome for osteomyelitis. There is also  osteomyelitis involving the second metatarsal and proximal phalanx with marked subluxation and possible septic arthritis. There is also an open wound on the plantar aspect of the forefoot overlying the region of the second metatarsal head. Diffuse cellulitis and myofasciitis. The irregular fluid collection along the medial aspect of the second metatarsal and proximal phalanx suspicious for abscess. The The tibiotalar and subtalar joints are maintained. Small joint effusions. No findings for mid or hindfoot osteomyelitis. IMPRESSION: MR findings consistent with osteomyelitis involving the second metatarsal and second proximal phalanx with probable intervening septic arthritis. There is also cellulitis and myofasciitis and a small abscess along the medial aspect of the second ray. Status post amputation of the first metatarsal. Signal abnormality in the base of the metatarsal could be postsurgical or due to osteomyelitis. Electronically Signed   By: Rudie Meyer M.D.   On: 12/22/2016 16:33    I have reviewed the patient's current medications.  Assessment/Plan: 1 AKI appears post infectious, improving. Vol xs but stable. Mild acidemia 2 Cirrhosis 3 DM 4 PVD post new L BKA should improve 5 Lethargy meds, post anesthesia P follow chem, No paracenteses, AB    LOS: 6 days   Calise Dunckel L 12/24/2016,10:45 AM

## 2016-12-24 NOTE — Progress Notes (Addendum)
PROGRESS NOTE  Jeffery [name]  ZOX:[id number] DOB: December 28, 1950 DOA: 12/18/2016 PCP: Julian Hy, MD  Brief Narrative:   Jeffery Flurchickis a 66 y.o.malewith medical history significant for end-stage liver cirrhosis, insulin-dependent diabetes mellitus, and chronic kidney disease stage III who presented from his nursing facility for evaluation of nausea, vomiting, and generalized weakness.  Patient was admitted to the hospital in February 2018 with osteomyelitis involving the left foot;he underwent first ray amputation with clean margins reported and was discharged to a skilled nursing facility. A culture reportedly grew staph and he wasplaced on Bactrim empirically on 12/12/2016. Since that time, he developed nausea with nonbloody nonbilious vomiting and progressive weakness.  He was found to have AKI with a serum creatinine of 3.27 up from 1.6 g/dL in March.  This is likely multifactorial due to infection, bactrim, and recent paracentesis.  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  MRI confirmed osteomyelitis, abscess and septic arthritis of the left foot.  Orthopedics, Dr. Ophelia Charter, to perform BKA of left leg on 4/7.     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 Acute kidney injury superimposed on CKD stage III due to end-stage liver disease, bactrim, recent large volume paracentesis, and Staph infection of the left foot.   Creatinine continues to trend down -  SCr initially 3.27, up from 1.6 in March 2018  -  Bactrim discontinued at admission -  Albumin, octreotide discontinued -  Continue midodrine -  C3 29 and C4 5, very low suggesting complement mediated nephropathy -  Source control as below  2. Osteomyelitis, septic arthritis, and abscess of left foot s/p first ray amputation -  Orthopedics assistance appreciated -  BKA performed on 4/8 -  PT eval -  Antibiotics discontinued  3. Liver cirrhosis  - Has esophageal varices, grade I by EGD in February 2018  - resume Lasix and Aldactone when deemed safe per nephrology - Continue Protonix   Metabolic encephalopathy, suspect hepatic encephalopathy based on history despite normal ammonia level.  Improved with lactulose and rifaximin but worse today after procedure yesterday -  Increase lactulose  -  Continue rifaximin -  US guided diagnostic paracentesis:  SBP unlikely given low WBC count -  Body fluid culture no growth final  4. Hyperkalemia, resolved with IVF and kayexelate  5. Hyponatremia, mild and asymptomatic in setting of cirrhosis  6. Normocytic anemia, thrombocytopenia due to cirrhosis -  Platelets trending up slightly -   Hemoglobin stable  7. Hypotension - improved with hydration  DVT prophylaxis:  Heparin  Code Status:  DNR Family Communication:  Patient alone  Disposition Plan:  BKA on 4/7.  Creatinine gradually improving.  PT eval and plan to discharge in a few more days depending on progression   Consultants:   Nephrology  Wound care  Procedures:  US guided paracentesis   Antimicrobials:  Anti-infectives    Start  Dose/Rate Route Frequency Ordered Stop   12/22/16 1715  piperacillin-tazobactam (ZOSYN) IVPB 3.375 g  Status:  Discontinued     3.375 g 12.5 mL/hr over 240 Minutes Intravenous Every 8 hours 12/22/16 1704 12/24/16 0723   12/21/16 0600  vancomycin (VANCOCIN) 1,500 mg in sodium chloride 0.9 % 500 mL  IVPB  Status:  Discontinued     1,500 mg 250 mL/hr over 120 Minutes Intravenous Every 48 hours 12/18/16 2353 12/24/16 0723   12/20/16 2200  rifaximin (XIFAXAN) tablet 550 mg     550 mg Oral 2 times daily 12/20/16 1801     12/18/16 2330  vancomycin (VANCOCIN) 2,000 mg in sodium chloride 0.9 % 500 mL IVPB     2,000 mg 250 mL/hr over 120 Minutes Intravenous  Once 12/18/16 2325 12/19/16 0159       Subjective:  Right abdominal wall still hurting.  Having nausea and vomited once yesterday, nonbilious, nonbloody.  Had one BM yesterday  Objective: Vitals:   12/23/16 1842 12/23/16 2104 12/24/16 0450 12/24/16 0952  BP: (!) 113/50 104/63 (!) 116/54 107/71  Pulse: (!) 51 (!) 54 (!) 59 64  Resp: Temp: 97.5 F (36.4 C) 97.8 F (36.6 C) 98.5 F (36.9 C) 97.8 F (36.6 C)  TempSrc: Oral Oral  Oral  SpO2: 98% 96% 95% 95%  Weight:  110.1 kg (242 lb 11.2 oz)    Height:        Intake/Output Summary (Last 24 hours) at 12/24/16 1501 Last data filed at 12/24/16 1439  Gross per 24 hour  Intake             1440 ml  Output              300 ml  Net             1140 ml   Filed Weights   12/19/16 2212 12/21/16 0545 12/23/16 2104  Weight: 103 kg (227 lb) 103 kg (227 lb) 110.1 kg (242 lb 11.2 oz)    Examination:  General exam:  Adult male, asleep but arousable.  Thinking less clearly, speech slower today HEENT:  NCAT, MMM Respiratory system:  No focal rales, rhonchi, or wheeze anteriorly Cardiovascular system: Regular rate and rhythm, normal S1/S2. No murmurs, rubs, gallops or clicks.  Warm extremities Gastrointestinal system: Normal active bowel sounds, soft, moderate to severe distension and TTP in right upper and lower quadrants with edematous skin and fluid wave.    MSK:  Right AKA.  Left BKA, stump wrapped.  Did not remove dressing for examination today Neuro:  Grossly moves upper extremities   Data Reviewed: I have personally reviewed following labs and imaging  studies  CBC:  Recent Labs Lab 12/18/16 1954  12/20/16 0726 12/20/16 0925 12/20/16 1605 12/21/16 0527 12/23/16 1034 12/24/16 0348  WBC 6.6  --  5.3 5.4  --  4.3 5.7 5.1  NEUTROABS 5.2  --   --   --  6.4  --   --   --   HGB 10.0*  < > 8.2* 8.3*  --  8.0* 8.8* 8.6*  HCT 29.0*  < > 24.1* 24.7*  --  24.4* 26.9* 26.4*  MCV 94.8  --  95.6 96.5  --  98.0 100.0 99.6  PLT 87*  --  58* 57*  --  46* REPEATED TO VERIFY 84*  < > = values in this interval not displayed. Basic Metabolic Panel:  Recent Labs Lab 12/20/16 0925 12/21/16 0527 12/22/16  1191 12/23/16 0149 12/24/16 0348  NA 133* 133*  133* 134* 136 135  K 4.2 4.2  4.2 4.2 4.5 4.5  CL 104 103  102 103 104 106  CO2 22 21*  21* 22 23 19*  GLUCOSE 172* 162*  163* 133* 139* 107*  BUN 67* 64*  64* 59* 54* 49*  CREATININE 3.31* 3.22*  3.19* 2.91* 2.76* 2.51*  CALCIUM 8.2* 8.3*  8.3* 8.4* 8.6* 8.3*  PHOS 3.8 3.7  3.6 3.5 3.2 3.4   GFR: Estimated Creatinine Clearance: 37.7 mL/min (A) (by C-G formula based on SCr of 2.51 mg/dL (H)). Liver Function Tests:  Recent Labs Lab 12/18/16 1954 12/19/16 0545  12/20/16 0925 12/21/16 0527 12/22/16 0504 12/23/16 0149 12/24/16 0348  AST 49* 43*  --   --  35  --   --   --   ALT 19 17  --   --  15*  --   --   --   ALKPHOS 72 63  --   --  48  --   --   --   BILITOT 2.6* 2.5*  --   --  1.6*  --   --   --   PROT 8.1 7.5  --   --  6.7  --   --   --   ALBUMIN 1.9* 1.6*  < > 1.6* 1.9*  1.9* 2.4* 2.5* 2.2*  < > = values in this interval not displayed. No results for input(s): LIPASE, AMYLASE in the last 168 hours.  Recent Labs Lab 12/20/16 0726  AMMONIA 27   Coagulation Profile:  Recent Labs Lab 12/18/16 2348 12/23/16 1034  INR 2.35 2.62   Cardiac Enzymes: No results for input(s): CKTOTAL, CKMB, CKMBINDEX, TROPONINI in the last 168 hours. BNP (last 3 results) No results for input(s): PROBNP in the last 8760 hours. HbA1C: No results for input(s): HGBA1C in the last 72  hours. CBG:  Recent Labs Lab 12/23/16 1159 12/23/16 1543 12/23/16 2157 12/24/16 0828 12/24/16 1216  GLUCAP 151* 116* 118* 92 95   Lipid Profile: No results for input(s): CHOL, HDL, LDLCALC, TRIG, CHOLHDL, LDLDIRECT in the last 72 hours. Thyroid Function Tests: No results for input(s): TSH, T4TOTAL, FREET4, T3FREE, THYROIDAB in the last 72 hours. Anemia Panel: No results for input(s): VITAMINB12, FOLATE, FERRITIN, TIBC, IRON, RETICCTPCT in the last 72 hours. Urine analysis:    Component Value Date/Time   COLORURINE YELLOW 12/18/2016 2200   APPEARANCEUR HAZY (A) 12/18/2016 2200   LABSPEC 1.015 12/18/2016 2200   PHURINE 5.0 12/18/2016 2200   GLUCOSEU NEGATIVE 12/18/2016 2200   HGBUR SMALL (A) 12/18/2016 2200   BILIRUBINUR NEGATIVE 12/18/2016 2200   KETONESUR NEGATIVE 12/18/2016 2200   PROTEINUR 30 (A) 12/18/2016 2200   NITRITE NEGATIVE 12/18/2016 2200   LEUKOCYTESUR NEGATIVE 12/18/2016 2200   Sepsis Labs: (procalcitonin:4,lacticidven:4)  ) Recent Results (from the past 240 hour(s))  MRSA PCR Screening     Status: Abnormal   Collection Time: 12/19/16  1:24 AM  Result Value Ref Range Status   MRSA by PCR POSITIVE (A) NEGATIVE Final    Comment:        The GeneXpert MRSA Assay (FDA approved for NASAL specimens only), is one component of a comprehensive MRSA colonization surveillance program. It is not intended to diagnose MRSA infection nor to guide or monitor treatment for MRSA infections. RESULT CALLED TO, READ BACK BY AND VERIFIED WITH: C DAVIS,RN  12/19/16 MKELLY,MLT   Gram stain     Status:  None   Collection Time: 12/20/16  3:11 PM  Result Value Ref Range Status   Specimen Description FLUID PERITONEAL  Final   Special Requests NONE  Final   Gram Stain   Final    ABUNDANT WBC PRESENT,BOTH PMN AND MONONUCLEAR NO ORGANISMS SEEN    Report Status 12/20/2016 FINAL  Final  Culture, body fluid-bottle     Status: None (Preliminary result)    Collection Time: 12/20/16  3:11 PM  Result Value Ref Range Status   Specimen Description FLUID PERITONEAL  Final   Special Requests BOTTLES DRAWN AEROBIC AND ANAEROBIC 10CC  Final   Culture NO GROWTH 3 DAYS  Final   Report Status PENDING  Incomplete      Radiology Studies: Mr Foot Left Wo Contrast  Result Date: 12/22/2016 CLINICAL DATA:  Diabetic foot ulcer.  History of prior amputation. EXAM: MRI OF THE LEFT FOOT WITHOUT CONTRAST TECHNIQUE: Multiplanar, multisequence MR imaging of the MRI 10/24/2016 was performed. No intravenous contrast was administered. COMPARISON:  MRI 10/24/2016 FINDINGS: Bones/Joint/Cartilage Status post amputation of the first ray back to the proximal first metatarsal. Signal abnormality in the base of metatarsal is worrisome for osteomyelitis. There is also osteomyelitis involving the second metatarsal and proximal phalanx with marked subluxation and possible septic arthritis. There is also an open wound on the plantar aspect of the forefoot overlying the region of the second metatarsal head. Diffuse cellulitis and myofasciitis. The irregular fluid collection along the medial aspect of the second metatarsal and proximal phalanx suspicious for abscess. The The tibiotalar and subtalar joints are maintained. Small joint effusions. No findings for mid or hindfoot osteomyelitis. IMPRESSION: MR findings consistent with osteomyelitis involving the second metatarsal and second proximal phalanx with probable intervening septic arthritis. There is also cellulitis and myofasciitis and a small abscess along the medial aspect of the second ray. Status post amputation of the first metatarsal. Signal abnormality in the base of the metatarsal could be postsurgical or due to osteomyelitis. Electronically Signed   By: Rudie Meyer M.D.   On: 12/22/2016 16:33     Scheduled Meds: . sodium chloride   Intravenous Once  . atorvastatin  40 mg Oral Daily  . brimonidine  1 drop Both Eyes Daily  .  Chlorhexidine Gluconate Cloth  6 each Topical Q0600  . feeding supplement (ENSURE ENLIVE)  237 mL Oral BID BM  . feeding supplement (PRO-STAT SUGAR FREE 64)  30 mL Oral TID  . heparin subcutaneous  5,000 Units Subcutaneous Q8H  . insulin aspart  0-5 Units Subcutaneous QHS  . insulin aspart  0-9 Units Subcutaneous TID WC  . lactulose  10 g Oral TID  . midodrine  15 mg Oral TID WC  . multivitamin with minerals  1 tablet Oral Daily  . mupirocin ointment  1 application Nasal BID  . pantoprazole  40 mg Oral BID  . rifaximin  550 mg Oral BID  . saccharomyces boulardii  250 mg Oral BID  . sodium bicarbonate  1,300 mg Oral BID  . sodium chloride flush  3 mL Intravenous Q12H  . sucralfate  1 g Oral TID AC & HS   Continuous Infusions:    LOS: 6 days    Time spent: 30 min    Renae Fickle, MD [facility] Pager [phone]  If 7PM-7AM, please contact night-coverage [email or web address] Password [email or web address] 12/24/2016, 3:01 PM

## 2016-12-24 NOTE — NC FL2 (Signed)
[place] MEDICAID FL2 LEVEL OF CARE SCREENING TOOL     IDENTIFICATION  Patient Name: Jeffery [name] Birthdate: 1951/04/14 Sex: male Admission Date (Current Location): 12/18/2016  [facility] and [place] Number:  Producer, television/film/video and Address:  [facility], [address] [place], [place] [address]      Provider Number: [id number]  Attending Physician Name and Address:  Renae Fickle, MD  Relative Name and Phone Number:       Current Level of Care: Hospital Recommended Level of Care: Skilled Nursing Facility Prior Approval Number:    Date Approved/Denied:   PASRR Number: [id number]  Discharge Plan: SNF    Current Diagnoses: Patient Active Problem List   Diagnosis Date Noted  . Abscess of left foot 12/22/2016  . Septic arthritis of left foot (HCC) 12/22/2016  . CKD (chronic kidney disease), stage III 12/18/2016  . Hyponatremia 12/18/2016  . Hyperkalemia 12/18/2016  . 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 Depressed affect 12/13/2016  . Leg edema 11/24/2016  . Cirrhosis of liver (HCC)   . Multiple duodenal ulcers   . Ulcer of esophagus without bleeding   . Melena   . Chronic liver failure without hepatic coma (HCC)   . Advance care planning   . Goals of care, counseling/discussion   . Palliative care by specialist   . End stage liver disease (HCC)   . Moderate single current episode of major depressive disorder (HCC)   . Anxiety state   . Diaper candidiasis   . Somnolence   . Moderate malnutrition (HCC)   . Noninfectious gastroenteritis   . Gastrointestinal hemorrhage   . Diabetic foot ulcer (HCC)   . Osteomyelitis of left foot (HCC)   . Acute renal failure (HCC)   . Abdominal pain   . Diarrhea   . Osteomyelitis (HCC)   . C. difficile colitis 10/23/2016  . AKI (acute kidney injury) (HCC)   . General weakness   . Gastrointestinal hemorrhage with melena   .  Idiopathic hypotension   . Other hyperlipidemia   . Esophageal varices without bleeding (HCC)   . Portal hypertensive gastropathy   . Cirrhosis of liver with ascites (HCC) 05/12/2016  . Special screening for malignant neoplasms, colon 05/12/2016  . Coagulopathy (HCC) 04/28/2016  . Hemolytic anemia (HCC) 03/24/2016  . G6PD deficiency anemia (HCC) 03/24/2016  . Thrombocytopenia (HCC) 03/24/2016  . Generalized psoriasis 03/20/2016  . Gilbert syndrome 03/20/2016  . Pancytopenia, acquired (HCC) 03/20/2016  . Hematemesis without nausea 03/20/2016    Orientation RESPIRATION BLADDER Height & Weight     Self, Situation, Time, Place  Normal Incontinent, Indwelling catheter Weight: 242 lb 11.2 oz (110.1 kg) Height:   (185.4 cm)  BEHAVIORAL SYMPTOMS/MOOD NEUROLOGICAL BOWEL NUTRITION STATUS      Incontinent Diet (see DC summary)  AMBULATORY STATUS COMMUNICATION OF NEEDS Skin   Extensive Assist Verbally Surgical wounds, Other (Comment) (blister on butt, surgical wound on left leg requiring gauze/compression wrap)                       Personal Care Assistance Level of Assistance  Bathing, Dressing Bathing Assistance: Maximum assistance   Dressing Assistance: Maximum assistance     Functional Limitations Info             SPECIAL CARE FACTORS FREQUENCY  PT (By licensed PT), OT (By licensed OT)     PT  Frequency: 5/wk OT Frequency: 5/wk            Contractures      Additional Factors Info  Code Status, Allergies, Insulin Sliding Scale, Isolation Precautions Code Status Info: DNR Allergies Info: NKA   Insulin Sliding Scale Info: 4/day Isolation Precautions Info: MRSA     Current Medications (12/24/2016):  This is the current hospital active medication list Current Facility-Administered Medications  Medication Dose Route Frequency Provider Last Rate Last Dose  . 0.9 %  sodium chloride infusion   Intravenous Once Eldred Manges, MD      . acetaminophen (TYLENOL)  tablet 650 mg  650 mg Oral Q6H PRN Eldred Manges, MD       Or  . acetaminophen (TYLENOL) suppository 650 mg  650 mg Rectal Q6H PRN Eldred Manges, MD      . atorvastatin (LIPITOR) tablet 40 mg  40 mg Oral Daily Briscoe Deutscher, MD   40 mg at 12/24/16 0849  . brimonidine (ALPHAGAN) 0.15 % ophthalmic solution 1 drop  1 drop Both Eyes Daily Briscoe Deutscher, MD   1 drop at 12/24/16 0855  . Chlorhexidine Gluconate Cloth 2 % PADS 6 each  6 each Topical Q0600 Renae Fickle, MD   6 each at 12/24/16 [phone]  . feeding supplement (ENSURE ENLIVE) (ENSURE ENLIVE) liquid 237 mL  237 mL Oral BID BM Lavone Neri Opyd, MD   237 mL at 12/22/16 1024  . feeding supplement (PRO-STAT SUGAR FREE 64) liquid 30 mL  30 mL Oral TID Briscoe Deutscher, MD   30 mL at 12/22/16 2105  . heparin injection 5,000 Units  5,000 Units Subcutaneous Q8H Renae Fickle, MD   5,000 Units at 12/24/16 [phone]  . insulin aspart (novoLOG) injection 0-5 Units  0-5 Units Subcutaneous QHS Renae Fickle, MD      . insulin aspart (novoLOG) injection 0-9 Units  0-9 Units Subcutaneous TID WC Renae Fickle, MD   1 Units at 12/23/16 1025  . ipratropium-albuterol (DUONEB) 0.5-2.5 (3) MG/3ML nebulizer solution 3 mL  3 mL Nebulization Q4H PRN Clanford L Johnson, MD      . lactulose (CHRONULAC) 10 GM/15ML solution 10 g  10 g Oral TID Renae Fickle, MD   10 g at 12/24/16 0849  . loperamide (IMODIUM) capsule 2 mg  2 mg Oral PRN Briscoe Deutscher, MD      . metoCLOPramide (REGLAN) tablet 5-10 mg  5-10 mg Oral Q8H PRN Eldred Manges, MD       Or  . metoCLOPramide (REGLAN) injection 5-10 mg  5-10 mg Intravenous Q8H PRN Eldred Manges, MD      . midodrine (PROAMATINE) tablet 15 mg  15 mg Oral TID WC Renae Fickle, MD   15 mg at 12/24/16 1256  . morphine 2 MG/ML injection 2 mg  2 mg Intravenous Q2H PRN Eldred Manges, MD      . multivitamin with minerals tablet 1 tablet  1 tablet Oral Daily Briscoe Deutscher, MD   1 tablet at 12/24/16 0849  . mupirocin ointment (BACTROBAN) 2 % 1  application  1 application Nasal BID Renae Fickle, MD   1 application at 12/24/16 [phone]  . ondansetron (ZOFRAN) tablet 4 mg  4 mg Oral Q6H PRN Eldred Manges, MD       Or  . ondansetron [facility]) injection 4 mg  4 mg Intravenous Q6H PRN Eldred Manges, MD      . oxyCODONE (Oxy IR/ROXICODONE)  immediate release tablet 5-10 mg  5-10 mg Oral Q3H PRN Eldred Manges, MD   10 mg at 12/24/16 0981  . pantoprazole (PROTONIX) EC tablet 40 mg  40 mg Oral BID Briscoe Deutscher, MD   40 mg at 12/24/16 0849  . rifaximin (XIFAXAN) tablet 550 mg  550 mg Oral BID Renae Fickle, MD   550 mg at 12/24/16 0849  . saccharomyces boulardii (FLORASTOR) capsule 250 mg  250 mg Oral BID Briscoe Deutscher, MD   250 mg at 12/24/16 0849  . sodium bicarbonate tablet 1,300 mg  1,300 mg Oral BID Beryle Lathe, MD   1,300 mg at 12/24/16 1255  . sodium chloride flush (NS) 0.9 % injection 3 mL  3 mL Intravenous Q12H Briscoe Deutscher, MD   3 mL at 12/23/16 0650  . sucralfate (CARAFATE) 1 GM/10ML suspension 1 g  1 g Oral TID AC & HS Briscoe Deutscher, MD   1 g at 12/24/16 1256     Discharge Medications: Please see discharge summary for a list of discharge medications.  Relevant Imaging Results:  Relevant Lab Results:   Additional Information SSN: [id number]  Burna Sis, LCSW

## 2016-12-25 LAB — RENAL FUNCTION PANEL
ALBUMIN: 2.3 g/dL — AB (ref 3.5–5.0)
Anion gap: 8 (ref 5–15)
BUN: 49 mg/dL — AB (ref 6–20)
CALCIUM: 8.4 mg/dL — AB (ref 8.9–10.3)
CO2: 21 mmol/L — AB (ref 22–32)
CREATININE: 2.7 mg/dL — AB (ref 0.61–1.24)
Chloride: 107 mmol/L (ref 101–111)
GFR calc Af Amer: 27 mL/min — ABNORMAL LOW (ref >60)
GFR calc non Af Amer: 23 mL/min — ABNORMAL LOW (ref >60)
GLUCOSE: 100 mg/dL — AB (ref 65–99)
PHOSPHORUS: 3.7 mg/dL (ref 2.5–4.6)
Potassium: 4.6 mmol/L (ref 3.5–5.1)
SODIUM: 136 mmol/L (ref 135–145)

## 2016-12-25 LAB — GLUCOSE, CAPILLARY
Glucose-Capillary: 93 mg/dL (ref 65–99)
Glucose-Capillary: 96 mg/dL (ref 65–99)
Glucose-Capillary: 99 mg/dL (ref 65–99)
Glucose-Capillary: 98 mg/dL (ref 65–99)

## 2016-12-25 MED ORDER — BISACODYL 10 MG RE SUPP
10.0000 mg | Freq: Once | RECTAL | Status: DC
Start: 2016-12-25 — End: 2016-12-27
  Filled 2016-12-25: qty 1

## 2016-12-25 MED ORDER — FUROSEMIDE 10 MG/ML IJ SOLN
80.0000 mg | Freq: Once | INTRAMUSCULAR | Status: AC
  Administered 2016-12-25: 80 mg via INTRAVENOUS
  Filled 2016-12-25: qty 8

## 2016-12-25 NOTE — Progress Notes (Signed)
PROGRESS NOTE  Jeffery [name]  WGN:[id number] DOB: 07/05/1951 DOA: 12/18/2016 PCP: Jeffery Hy, MD  Brief Narrative:   Jeffery [name] a 66 y.o.malewith medical history significant for end-stage liver cirrhosis, insulin-dependent diabetes mellitus, and chronic kidney disease stage III who presented from his nursing facility for evaluation of nausea, vomiting, and generalized weakness.  Patient was admitted to the hospital in February 2018 with osteomyelitis involving the left foot;he underwent first ray amputation with clean margins reported and was discharged to a skilled nursing facility. A culture reportedly grew staph and he wasplaced on Bactrim empirically on 12/12/2016. Since that time, he developed nausea with nonbloody nonbilious vomiting and progressive weakness.  He was found to have AKI with a serum creatinine of 3.27 up from 1.6 g/dL in March.  This is likely multifactorial due to infection, bactrim, and recent paracentesis.  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  MRI confirmed osteomyelitis, abscess and septic arthritis of the left foot.  Orthopedics, Dr. Ophelia Charter, to perform BKA of left leg on 4/7.  He remains somewhat encephalopathic with nausea and inability to tolerate PO.     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 Acute kidney injury superimposed on CKD stage III due to end-stage liver disease,  bactrim, recent large volume paracentesis, and Staph infection of the left foot.  Creatinine appears to have stabilized around 2.5-2.7. -  SCr initially 3.27, up from 1.6 in March 2018  -  Bactrim discontinued at admission -  Albumin, octreotide discontinued -  Continue midodrine -  C3 29 and C4 5, very low suggesting complement mediated nephropathy -  Source control as below  2. 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 Liver cirrhosis  - Has esophageal varices, grade I by EGD in February 2018  - trial of lasix  IV once today - Continue Protonix   Nausea and vomiting, likely related to AKI and infection -  Continue antiemetics -    Metabolic encephalopathy, improving but still not back to baseline today.  May be exacerbated by constipation -  Continue increased lactulose  -  Continue rifaximin -  US guided diagnostic paracentesis:  SBP unlikely given low WBC count -  Body fluid culture no growth final -  Bisacodyl suppository and trial of enema if not resolved.  4. Hyperkalemia, resolved with IVF and kayexelate  5. Hyponatremia, mild and asymptomatic in setting of cirrhosis  6. Normocytic anemia, thrombocytopenia due to cirrhosis -  Platelets trending up slightly -   Hemoglobin stable  7. Hypotension - improved with hydration  DVT prophylaxis:  Heparin  Code Status:  DNR Family Communication:  Patient alone  Disposition Plan:  BKA on 4/7.  Still somewhat encephalopathic after surgery and not eating and drinking due to nausea.  Discharge to  SNF once confusion and appetite improve  Consultants:   Nephrology  Wound care  Procedures:  US guided paracentesis   Antimicrobials:  Anti-infectives    Start     Dose/Rate Route Frequency Ordered Stop   12/22/16 1715  piperacillin-tazobactam (ZOSYN) IVPB 3.375 g  Status:  Discontinued     3.375  g 12.5 mL/hr over 240 Minutes Intravenous Every 8 hours 12/22/16 1704 12/24/16 0723   12/21/16 0600  vancomycin (VANCOCIN) 1,500 mg in sodium chloride 0.9 % 500 mL IVPB  Status:  Discontinued     1,500 mg 250 mL/hr over 120 Minutes Intravenous Every 48 hours 12/18/16 2353 12/24/16 0723   12/20/16 2200  rifaximin (XIFAXAN) tablet 550 mg     550 mg Oral 2 times daily 12/20/16 1801     12/18/16 2330  vancomycin (VANCOCIN) 2,000 mg in sodium chloride 0.9 % 500 mL IVPB     2,000 mg 250 mL/hr over 120 Minutes Intravenous  Once 12/18/16 2325 12/19/16 0159       Subjective:  Still having nausea.  Vomited once this morning.  No stools since yesterday.    Objective: Vitals:   12/24/16 1826 12/24/16 2230 12/25/16 0606 12/25/16 1000  BP: 109/63 116/64 (!) 109/53 (!) 118/53  Pulse: (!) 58 62 65 70  Resp: Temp: 98.9 F (37.2 C) 97.8 F (36.6 C) 98.7 F (37.1 C) 97.7 F (36.5 C)  TempSrc: Oral Oral  Oral  SpO2: 98% 96% 95% 95%  Weight:  105.7 kg (233 lb)    Height:        Intake/Output Summary (Last 24 hours) at 12/25/16 1419 Last data filed at 12/25/16 1328  Gross per 24 hour  Intake              483 ml  Output              350 ml  Net              133 ml   Filed Weights   12/21/16 0545 12/23/16 2104 12/24/16 2230  Weight: 103 kg (227 lb) 110.1 kg (242 lb 11.2 oz) 105.7 kg (233 lb)    Examination:  General exam:  Adult male, awake, but slow to speech.  Perioral pallor  HEENT:  NCAT, MMM Respiratory system:  No focal rales, rhonchi, or wheeze anteriorly Cardiovascular system: Regular rate and rhythm, normal S1/S2. No murmurs, rubs, gallops or clicks.  Warm extremities Gastrointestinal system: Normal active bowel sounds, soft, moderate to severe distension, nontender MSK:  Right AKA.  Left BKA, stump wrapped.  Did not remove dressing for examination today Neuro:  Grossly moves upper extremities   Data Reviewed: I have personally reviewed following labs and  imaging studies  CBC:  Recent Labs Lab 12/18/16 1954  12/20/16 0726 12/20/16 0925 12/20/16 1605 12/21/16 0527 12/23/16 1034 12/24/16 0348  WBC 6.6  --  5.3 5.4  --  4.3 5.7 5.1  NEUTROABS 5.2  --   --   --  6.4  --   --   --   HGB 10.0*  < > 8.2* 8.3*  --  8.0* 8.8* 8.6*  HCT 29.0*  < > 24.1* 24.7*  --  24.4* 26.9* 26.4*  MCV 94.8  --  95.6 96.5  --  98.0 100.0 99.6  PLT 87*  --  58* 57*  --  46* REPEATED TO VERIFY 84*  < > = values in this interval not displayed. Basic Metabolic  Panel:  Recent Labs Lab 12/21/16 0527 12/22/16 0504 12/23/16 0149 12/24/16 0348 12/25/16 0656  NA 133*  133* 134* 136 135 136  K 4.2  4.2 4.2 4.5 4.5 4.6  CL 103  102 103 104 106 107  CO2 21*  21* 22 23 19* 21*  GLUCOSE 162*  163* 133* 139* 107* 100*  BUN 64*  64* 59* 54* 49* 49*  CREATININE 3.22*  3.19* 2.91* 2.76* 2.51* 2.70*  CALCIUM 8.3*  8.3* 8.4* 8.6* 8.3* 8.4*  PHOS 3.7  3.6 3.5 3.2 3.4 3.7   GFR: Estimated Creatinine Clearance: 34.3 mL/min (A) (by C-G formula based on SCr of 2.7 mg/dL (H)). Liver Function Tests:  Recent Labs Lab 12/18/16 1954 12/19/16 0545  12/21/16 0527 12/22/16 0504 12/23/16 0149 12/24/16 0348 12/25/16 0656  AST 49* 43*  --  35  --   --   --   --   ALT 19 17  --  15*  --   --   --   --   ALKPHOS 72 63  --  48  --   --   --   --   BILITOT 2.6* 2.5*  --  1.6*  --   --   --   --   PROT 8.1 7.5  --  6.7  --   --   --   --   ALBUMIN 1.9* 1.6*  < > 1.9*  1.9* 2.4* 2.5* 2.2* 2.3*  < > = values in this interval not displayed. No results for input(s): LIPASE, AMYLASE in the last 168 hours.  Recent Labs Lab 12/20/16 0726  AMMONIA 27   Coagulation Profile:  Recent Labs Lab 12/18/16 2348 12/23/16 1034  INR 2.35 2.62   Cardiac Enzymes: No results for input(s): CKTOTAL, CKMB, CKMBINDEX, TROPONINI in the last 168 hours. BNP (last 3 results) No results for input(s): PROBNP in the last 8760 hours. HbA1C: No results for input(s): HGBA1C in the  last 72 hours. CBG:  Recent Labs Lab 12/24/16 1216 12/24/16 1824 12/24/16 2227 12/25/16 0806 12/25/16 1155  GLUCAP 95 106* 98 93 96   Lipid Profile: No results for input(s): CHOL, HDL, LDLCALC, TRIG, CHOLHDL, LDLDIRECT in the last 72 hours. Thyroid Function Tests: No results for input(s): TSH, T4TOTAL, FREET4, T3FREE, THYROIDAB in the last 72 hours. Anemia Panel: No results for input(s): VITAMINB12, FOLATE, FERRITIN, TIBC, IRON, RETICCTPCT in the last 72 hours. Urine analysis:    Component Value Date/Time   COLORURINE YELLOW 12/18/2016 2200   APPEARANCEUR HAZY (A) 12/18/2016 2200   LABSPEC 1.015 12/18/2016 2200   PHURINE 5.0 12/18/2016 2200   GLUCOSEU NEGATIVE 12/18/2016 2200   HGBUR SMALL (A) 12/18/2016 2200   BILIRUBINUR NEGATIVE 12/18/2016 2200   KETONESUR NEGATIVE 12/18/2016 2200   PROTEINUR 30 (A) 12/18/2016 2200   NITRITE NEGATIVE 12/18/2016 2200   LEUKOCYTESUR NEGATIVE 12/18/2016 2200   Sepsis Labs: (procalcitonin:4,lacticidven:4)  ) Recent Results (from the past 240 hour(s))  MRSA PCR Screening     Status: Abnormal   Collection Time: 12/19/16  1:24 AM  Result Value Ref Range Status   MRSA by PCR POSITIVE (A) NEGATIVE Final    Comment:        The GeneXpert MRSA Assay (FDA approved for NASAL specimens only), is one component of a comprehensive MRSA colonization surveillance program. It is not intended to diagnose MRSA infection nor to guide or monitor treatment for MRSA infections. RESULT CALLED TO, READ BACK BY AND VERIFIED WITH: C DAVIS,RN  12/19/16  MKELLY,MLT   Gram stain     Status: None   Collection Time: 12/20/16  3:11 PM  Result Value Ref Range Status   Specimen Description FLUID PERITONEAL  Final   Special Requests NONE  Final   Gram Stain   Final    ABUNDANT WBC PRESENT,BOTH PMN AND MONONUCLEAR NO ORGANISMS SEEN    Report Status 12/20/2016 FINAL  Final  Culture, body fluid-bottle     Status: None (Preliminary result)    Collection Time: 12/20/16  3:11 PM  Result Value Ref Range Status   Specimen Description FLUID PERITONEAL  Final   Special Requests BOTTLES DRAWN AEROBIC AND ANAEROBIC 10CC  Final   Culture NO GROWTH 4 DAYS  Final   Report Status PENDING  Incomplete      Radiology Studies: No results found.   Scheduled Meds: . sodium chloride   Intravenous Once  . atorvastatin  40 mg Oral Daily  . brimonidine  1 drop Both Eyes Daily  . Chlorhexidine Gluconate Cloth  6 each Topical Q0600  . feeding supplement (ENSURE ENLIVE)  237 mL Oral BID BM  . feeding supplement (PRO-STAT SUGAR FREE 64)  30 mL Oral TID  . heparin subcutaneous  5,000 Units Subcutaneous Q8H  . insulin aspart  0-5 Units Subcutaneous QHS  . insulin aspart  0-9 Units Subcutaneous TID WC  . lactulose  10 g Oral TID  . midodrine  15 mg Oral TID WC  . multivitamin with minerals  1 tablet Oral Daily  . mupirocin ointment  1 application Nasal BID  . pantoprazole  40 mg Oral BID  . rifaximin  550 mg Oral BID  . saccharomyces boulardii  250 mg Oral BID  . sodium chloride flush  3 mL Intravenous Q12H  . sucralfate  1 g Oral TID AC & HS   Continuous Infusions:    LOS: 7 days    Time spent: 30 min    Renae Fickle, MD [facility] Pager [phone]  If 7PM-7AM, please contact night-coverage [email or web address] Password [email or web address] 12/25/2016, 2:19 PM

## 2016-12-25 NOTE — Progress Notes (Signed)
Patient rescinds DNR,wants to have everything done in case his heart/breathing  Stops. MD on call notified. Ryane Canavan, Drinda Butts, Charity fundraiser

## 2016-12-25 NOTE — Progress Notes (Signed)
OT Screen    12/25/16 0700  OT Visit Information  Last OT Received On 12/25/16  Reason Eval/Treat Not Completed OT screened, no needs identified, will sign off.  Pt with no skilled acute OT needs noted. Dependent for ADLs and functional mobility. DC to prior SNF. All further OT needs can be met in the next venue of care. With follow up as they deem necessary.   [employer], OTR/L [phone]

## 2016-12-25 NOTE — Progress Notes (Signed)
CSW received consult regarding PT recommendation of SNF at discharge.  Patient is refusing SNF. He has two medical professionals that live on either side of him and he is arranging for supervision. He does not want to go back to [facility] because he does not like their care and he feels insurance is extorting him for wanting to go home.   CSW signing off.   Jeffery [name] LCSWA [phone]

## 2016-12-25 NOTE — Progress Notes (Signed)
   Subjective: 2 Days Post-Op Procedure(s) (LRB): AMPUTATION BELOW KNEE (Left) Patient reports pain as moderate.    Objective: Vital signs in last 24 hours: Temp:  [97.7 F (36.5 C)-98.9 F (37.2 C)] 97.7 F (36.5 C) (04/09 1000) Pulse Rate:  [58-70] 70 (04/09 1000) Resp:  [17-18] 18 (04/09 1000) BP: (109-118)/(53-64) 118/53 (04/09 1000) SpO2:  [95 %-98 %] 95 % (04/09 1000) Weight:  [233 lb (105.7 kg)] 233 lb (105.7 kg) (04/08 2230)  Intake/Output from previous day: 04/08 0701 - 04/09 0700 In: 360 [P.O.:360] Out: 350 [Urine:350] Intake/Output this shift: Total I/O In: 120 [P.O.:120] Out: -    Recent Labs  12/23/16 1034 12/24/16 0348  HGB 8.8* 8.6*    Recent Labs  12/23/16 1034 12/24/16 0348  WBC 5.7 5.1  RBC 2.69* 2.65*  HCT 26.9* 26.4*  PLT REPEATED TO VERIFY 84*    Recent Labs  12/24/16 0348 12/25/16 0656  NA 135 136  K 4.5 4.6  CL 106 107  CO2 19* 21*  BUN 49* 49*  CREATININE 2.51* 2.70*  GLUCOSE 107* 100*  CALCIUM 8.3* 8.4*    Recent Labs  12/23/16 1034  INR 2.62    dressing dry. quad active.  No results found.  Assessment/Plan: 2 Days Post-Op Procedure(s) (LRB): AMPUTATION BELOW KNEE (Left) Plan:   Could stop ABX after 48 hrs post op. SNF.  PT work on transfers with slideboard and W/C with removable armrest.    Eldred Manges 12/25/2016, 11:17 AM

## 2016-12-25 NOTE — Progress Notes (Signed)
Assessment/Plan: 1 AKI ?post infectious.  Vol xs but stable. Mild acidemia 2 Cirrhosis w/ ascites 3 DM 4 PVD post new L BKA should improve 5 Lethargy meds, post anesthesia P follow chem, No paracenteses, AB, one dose of IV lasix and hold bicarb due to volume excess          Subjective: Interval History: Feels full  Objective: Vital signs in last 24 hours: Temp:  [97.7 F (36.5 C)-98.9 F (37.2 C)] 97.7 F (36.5 C) (04/09 1000) Pulse Rate:  [58-70] 70 (04/09 1000) Resp:  [17-18] 18 (04/09 1000) BP: (109-118)/(53-64) 118/53 (04/09 1000) SpO2:  [95 %-98 %] 95 % (04/09 1000) Weight:  [105.7 kg (233 lb)] 105.7 kg (233 lb) (04/08 2230) Weight change: -4.4 kg (-9 lb 11.2 oz)  Intake/Output from previous day: 04/08 0701 - 04/09 0700 In: 360 [P.O.:360] Out: 350 [Urine:350] Intake/Output this shift: Total I/O In: 123 [P.O.:120; I.V.:3] Out: -   Head: Normocephalic, without obvious abnormality, atraumatic Neck: JVD bulging Cardio: regular rate and rhythm, S1, S2 normal, no murmur, click, rub or gallop GI: diffuse ascites Extremities: BKA  Lab Results:  Recent Labs  12/23/16 1034 12/24/16 0348  WBC 5.7 5.1  HGB 8.8* 8.6*  HCT 26.9* 26.4*  PLT REPEATED TO VERIFY 84*   BMET:  Recent Labs  12/24/16 0348 12/25/16 0656  NA 135 136  K 4.5 4.6  CL 106 107  CO2 19* 21*  GLUCOSE 107* 100*  BUN 49* 49*  CREATININE 2.51* 2.70*  CALCIUM 8.3* 8.4*   No results for input(s): PTH in the last 72 hours. Iron Studies: No results for input(s): IRON, TIBC, TRANSFERRIN, FERRITIN in the last 72 hours. Studies/Results: No results found.  Scheduled: . sodium chloride   Intravenous Once  . atorvastatin  40 mg Oral Daily  . brimonidine  1 drop Both Eyes Daily  . Chlorhexidine Gluconate Cloth  6 each Topical Q0600  . feeding supplement (ENSURE ENLIVE)  237 mL Oral BID BM  . feeding supplement (PRO-STAT SUGAR FREE 64)  30 mL Oral TID  . heparin subcutaneous  5,000 Units  Subcutaneous Q8H  . insulin aspart  0-5 Units Subcutaneous QHS  . insulin aspart  0-9 Units Subcutaneous TID WC  . lactulose  10 g Oral TID  . midodrine  15 mg Oral TID WC  . multivitamin with minerals  1 tablet Oral Daily  . mupirocin ointment  1 application Nasal BID  . pantoprazole  40 mg Oral BID  . rifaximin  550 mg Oral BID  . saccharomyces boulardii  250 mg Oral BID  . sodium chloride flush  3 mL Intravenous Q12H  . sucralfate  1 g Oral TID AC & HS    LOS: 7 days   Natacha Jepsen C 12/25/2016,2:18 PM

## 2016-12-25 NOTE — Progress Notes (Signed)
Patient ID: Jeffery [name], male   DOB: August 12, 1951, 66 y.o.   MRN: [id number]   Patient verbally rescinded DNR.  Now full code.

## 2016-12-25 NOTE — Progress Notes (Signed)
qPhysical Therapy Treatment Patient Details Name: Jeffery [name] MRN: [id number] DOB: May 25, 1951 Today's Date: 12/25/2016    History of Present Illness Pt is a 66 y/o male admitted from SNF secondary to nausea, vomitting and generalized weakness. Pt found to have an AKI superimposed on CKD stage III. 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    PT Comments    Pt continues to need significant assist to mobilize in bed toward EOB and significant truncal and sliding assist to accomplish an ant/post transfer into the chair   Follow Up Recommendations  SNF;Supervision/Assistance - 24 hour     Equipment Recommendations  None recommended by PT    Recommendations for Other Services       Precautions / Restrictions Precautions Precautions: Fall Restrictions Weight Bearing Restrictions: No    Mobility  Bed Mobility Overal bed mobility: Needs Assistance Bed Mobility: Supine to Sit     Supine to sit: Max assist;+2 for physical assistance     General bed mobility comments: V/T cues to come forward and use UE's to push up.  Truncal assist to rise forward and support while positioning UE's  Transfers Overall transfer level: Needs assistance   Transfers: Licensed conveyancer transfers: Max assist;+2 physical assistance   General transfer comment: Support trunk while positioning in long sitting--hamstring tightness making sitting more difficult.  Assisted pt with pad and truncal support to scoot posteriorly into the recliner  Ambulation/Gait             General Gait Details: Not Applicable   Stairs            Wheelchair Mobility    Modified Rankin (Stroke Patients Only)       Balance Overall balance assessment: Needs assistance Sitting-balance support: Bilateral upper extremity supported Sitting  balance-Leahy Scale: Poor Sitting balance - Comments: long sitting                                    Cognition Arousal/Alertness: Awake/alert Behavior During Therapy: Anxious Overall Cognitive Status: Within Functional Limits for tasks assessed                                        Exercises Amputee Exercises Quad Sets: Left;10 reps;Seated Hip Extension: 5 reps;AROM;Left;Supine Hip Flexion/Marching: AROM;Strengthening;5 reps;Supine Knee Flexion: AROM;5 reps;Supine;Left Knee Extension: AROM;Left;5 reps;Supine    General Comments        Pertinent Vitals/Pain Pain Assessment: Faces Faces Pain Scale: Hurts little more Pain Location: L Leg Pain Descriptors / Indicators: Sore;Grimacing;Guarding Pain Intervention(s): Monitored during session;Repositioned    Home Living Family/patient expects to be discharged to:: Private residence Living Arrangements: Alone Available Help at Discharge: Friend(s) Type of Home: House Home Access: Ramped entrance   Home Layout: One level Home Equipment: Shower seat - built in;Wheelchair - manual Additional Comments: works as Psychologist, prison and probation services at [employer]    Prior Teaching laboratory technician / Transfers Assistance Needed: Pt able to transfer I untill about 2 months ago   Comments: Pt has been immobile ~ 2 month. Pt was transfering    PT Goals (current goals can now be found in the care plan section) Acute Rehab PT Goals Patient  Stated Goal: to feel better PT Goal Formulation: With patient Time For Goal Achievement: 01/03/17 Potential to Achieve Goals: Fair Progress towards PT goals: Progressing toward goals    Frequency    Min 3X/week      PT Plan Current plan remains appropriate    Co-evaluation             End of Session   Activity Tolerance: Patient limited by fatigue;Patient tolerated treatment well Patient left: in chair;with call bell/phone within reach;Other (comment) (on lift pad.) Nurse  Communication: Mobility status;Need for lift equipment PT Visit Diagnosis: Pain;Muscle weakness (generalized) (M62.81);Difficulty in walking, not elsewhere classified (R26.2) Pain - Right/Left: Left Pain - part of body:  (stump)     Time: 5284-1324 PT Time Calculation (min) (ACUTE ONLY): 45 min  Charges:  $Therapeutic Activity: 23-37 mins                    G Codes:       01/09/2017  [place] Bing, PT [phone]  (pager)   Jeffery [name] 01/09/2017, 2:21 PM 2017-01-09   Bing, PT [phone]  (pager)

## 2016-12-25 NOTE — Evaluation (Signed)
Occupational Therapy Evaluation Patient Details Name: Jeffery [name] MRN: [id number] DOB: [birth year] Today's Date: 12/25/2016    History of Present Illness Pt is a 66 y/o male admitted from SNF secondary to nausea, vomitting and generalized weakness. Pt found to have an AKI superimposed on CKD stage III. 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   Clinical Impression   PTA, pt was at SNF due to left foot osteomyelitis and drainage. At the time, pt was dependent for ADLs and functional mobility. Prior to stay at SNF, pt was living alone, performing ADLs and IADLs, and working. Pt currently performs ADLs at bed level and required Max A +2 for anterior-posterior transfer to recliner. Pt would benefit from continued skilled OT to increase his independence in ADLs and functional transfers. Recommend pt dc to SNF for further OT to facilitate safe dc home and increase independence.    Follow Up Recommendations  SNF;Supervision/Assistance - 24 hour    Equipment Recommendations  Other (comment) (Defer to next venue)    Recommendations for Other Services PT consult     Precautions / Restrictions Precautions Precautions: Fall Restrictions Weight Bearing Restrictions: No      Mobility Bed Mobility Overal bed mobility: Needs Assistance Bed Mobility: Supine to Sit     Supine to sit: Max assist;+2 for physical assistance     General bed mobility comments: cues to come forward and use UE's to push up.  Truncal assist to rise forward and support while positioning UE's  Transfers Overall transfer level: Needs assistance   Transfers: Licensed conveyancer transfers: Max assist;+2 physical assistance   General transfer comment: Support trunk while positioning in long sitting--hamstring tightness making sitting more difficult.  Assisted pt with pad and  truncal support to scoot posteriorly into the recliner    Balance Overall balance assessment: Needs assistance Sitting-balance support: Bilateral upper extremity supported Sitting balance-Leahy Scale: Poor Sitting balance - Comments: long sitting                                   ADL either performed or assessed with clinical judgement   ADL Overall ADL's : Needs assistance/impaired Eating/Feeding: Set up;Sitting   Grooming: Set up;Bed level   Upper Body Bathing: Minimal assistance;Bed level   Lower Body Bathing: Moderate assistance;Bed level   Upper Body Dressing : Minimal assistance;Bed level   Lower Body Dressing: Maximal assistance;Bed level               Functional mobility during ADLs: Maximal assistance;+2 for physical assistance General ADL Comments: Pt will require strengthing of BUE to increase independence in ADLs and functional transfers     Vision         Perception     Praxis      Pertinent Vitals/Pain Pain Assessment: Faces Faces Pain Scale: Hurts little more Pain Location: L Leg Pain Descriptors / Indicators: Sore;Grimacing;Guarding Pain Intervention(s): Monitored during session;Repositioned     Hand Dominance Right   Extremity/Trunk Assessment Upper Extremity Assessment Upper Extremity Assessment: Generalized weakness   Lower Extremity Assessment Lower Extremity Assessment: Generalized weakness       Communication Communication Communication: No difficulties   Cognition Arousal/Alertness: Awake/alert Behavior During Therapy: Anxious Overall Cognitive Status: Within Functional Limits for tasks assessed  General Comments       Exercises Exercises: Amputee Amputee Exercises Quad Sets: Left;10 reps;Seated Hip Extension: 5 reps;AROM;Left;Supine Hip Flexion/Marching: AROM;Strengthening;5 reps;Supine Knee Flexion: AROM;5 reps;Supine;Left Knee Extension: AROM;Left;5  reps;Supine   Shoulder Instructions      Home Living Family/patient expects to be discharged to:: Skilled nursing facility Living Arrangements: Alone Available Help at Discharge: Friend(s) Type of Home: House Home Access: Ramped entrance     Home Layout: One level     Bathroom Shower/Tub: Walk-in shower;Door   [employer] Toilet: Handicapped height Bathroom Accessibility: Yes How Accessible: Accessible via wheelchair Home Equipment: Shower seat - built in;Wheelchair - manual   Additional Comments: works as Psychologist, prison and probation services at [employer]      Prior Functioning/Environment Level of Independence: Development worker, community / Transfers Assistance Needed: Pt able to transfer I untill about 2 months ago ADL's / Homemaking Assistance Needed: Pt performing ADLs till 2 months ago during his toe amputation   Comments: Pt has been immobile ~ 2 month. Pt was transfering         OT Problem List: Decreased strength;Decreased activity tolerance;Impaired balance (sitting and/or standing);Decreased safety awareness;Decreased knowledge of use of DME or AE;Pain;Decreased range of motion      OT Treatment/Interventions: Self-care/ADL training;Therapeutic exercise;Energy conservation;DME and/or AE instruction;Therapeutic activities;Patient/family education    OT Goals(Current goals can be found in the care plan section) Acute Rehab OT Goals Patient Stated Goal: to feel better OT Goal Formulation: With patient Time For Goal Achievement: 01/08/17 Potential to Achieve Goals: Good ADL Goals Pt Will Perform Grooming: sitting;with min assist Pt Will Perform Upper Body Bathing: with min assist;sitting Pt Will Perform Lower Body Bathing: with mod assist;sitting/lateral leans Pt Will Perform Upper Body Dressing: with min assist;sitting Pt Will Perform Lower Body Dressing: with mod assist;sitting/lateral leans Pt Will Transfer to Toilet: with mod assist;bedside commode  OT Frequency: Min 2X/week    Barriers to D/C: Decreased caregiver support  Pt lives alone       Co-evaluation PT/OT/SLP Co-Evaluation/Treatment: Yes Reason for Co-Treatment: Complexity of the patient's impairments (multi-system involvement);To address functional/ADL transfers PT goals addressed during session: Mobility/safety with mobility OT goals addressed during session: ADL's and self-care      End of Session Equipment Utilized During Treatment: Rolling walker Nurse Communication: Mobility status  Activity Tolerance: Patient tolerated treatment well Patient left: in chair;with call bell/phone within reach (with leg elevated on bed)  OT Visit Diagnosis: Unsteadiness on feet (R26.81);Muscle weakness (generalized) (M62.81);Pain Pain - Right/Left: Left Pain - part of body: Leg                Time: 1610-9604 OT Time Calculation (min): 45 min Charges:  OT General Charges $OT Visit: 1 Procedure OT Evaluation $OT Eval Moderate Complexity: 1 Procedure G-Codes:     Jeffery [name], OTR/L [phone]  Theodoro Grist Shiron Whetsel 12/25/2016, 5:14 PM

## 2016-12-26 DIAGNOSIS — K7200 Acute and subacute hepatic failure without coma: Secondary | ICD-10-CM

## 2016-12-26 LAB — RENAL FUNCTION PANEL
ALBUMIN: 2.2 g/dL — AB (ref 3.5–5.0)
Anion gap: 7 (ref 5–15)
BUN: 50 mg/dL — AB (ref 6–20)
CALCIUM: 8.3 mg/dL — AB (ref 8.9–10.3)
CO2: 21 mmol/L — ABNORMAL LOW (ref 22–32)
CREATININE: 2.74 mg/dL — AB (ref 0.61–1.24)
Chloride: 107 mmol/L (ref 101–111)
GFR calc Af Amer: 26 mL/min — ABNORMAL LOW (ref >60)
GFR, EST NON AFRICAN AMERICAN: 23 mL/min — AB (ref >60)
Glucose, Bld: 97 mg/dL (ref 65–99)
PHOSPHORUS: 3.7 mg/dL (ref 2.5–4.6)
Potassium: 4.6 mmol/L (ref 3.5–5.1)
SODIUM: 135 mmol/L (ref 135–145)

## 2016-12-26 LAB — GLUCOSE, CAPILLARY
GLUCOSE-CAPILLARY: 239 mg/dL — AB (ref 65–99)
Glucose-Capillary: 93 mg/dL (ref 65–99)
Glucose-Capillary: 162 mg/dL — ABNORMAL HIGH (ref 65–99)
Glucose-Capillary: 212 mg/dL — ABNORMAL HIGH (ref 65–99)

## 2016-12-26 LAB — CULTURE, BODY FLUID W GRAM STAIN -BOTTLE

## 2016-12-26 LAB — CULTURE, BODY FLUID-BOTTLE: CULTURE: NO GROWTH

## 2016-12-26 MED ORDER — NEPRO/CARBSTEADY PO LIQD
237.0000 mL | Freq: Every day | ORAL | Status: DC
  Administered 2016-12-26: 237 mL via ORAL

## 2016-12-26 NOTE — Care Management Note (Addendum)
Case Management Note  Patient Details  Name: Antwyne Pingree MRN: [id number] Date of Birth: [birth year]  Subjective/Objective:    CM following for progression and d/c planning.                 Action/Plan: 12/26/2016 Met with pt and friend , Suezanne Jacquet, who is also Insurance underwriter. Per pt and Sharyn Blitz and his wife will be checking on the pt regularly, additionally the pt neighbors on both sides are aware of his d/c to home and will be checking on him. Pt has DME as he has been an amputee for 6 years. He is requesting a hospital bed to assist with transfers, [facility] notified.  [place] services arranged with [facility]. Will ask [facility] to provide hospital bed.  12 noon, notified by OT that this pt would agree to CIR if determined to be eligible, message sent to Dr Sheran Fava re order for CIR eval.  Expected Discharge Date:    12/26/2016              Expected Discharge Plan:  [place]  In-House Referral:  NA  Discharge planning Services  CM Consult  Post Acute Care Choice:  Durable Medical Equipment, Home Health Choice offered to:  Patient  DME Arranged:  Hospital bed DME Agency:  [place]:  RN, PT, OT, Nurse's Aide, Social Work [employer] Agency:  [employer] (now [facility])  Status of Service:  Completed, signed off  If discussed at [employer], dates discussed:    Additional Comments:  Adron Bene, RN 12/26/2016, 10:47 AM

## 2016-12-26 NOTE — Progress Notes (Signed)
Occupational Therapy Treatment Patient Details Name: Jeffery [name] MRN: [id number] DOB: 1951-04-21 Today's Date: 12/26/2016    History of present illness Pt is a 66 y/o male admitted from SNF secondary to nausea, vomitting and generalized weakness. Pt found to have an AKI superimposed on CKD stage III. 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   OT comments  DISCUSSED WITH PATIENT THAT HE WOULD GREATLY BENEFIT FROM REHAB PRIOR TO D/C HOME. PATIENT WAS IN AGREEMENT THAT HE IS NOT SAFE TO GO HOME. PATIENT STATES THAT HE IS NOT WILLING TO GO TO SNF BUT IS WILLING TO CONSIDER CIR. PATIENT NURSE WAS NOTIFIED THAT PATIENT WANTS REFERREL TO CIR. PATIENT WAS ABLE TO SIT EOB FOR 30 MIN AT S LEVEL. PATIENT WAS ABLE TO PERFORM SUPINE TO SIT EOB AT MOD A LEVEL. PATIENT WAS MAX A TO SIDE SCOOT UP TO HEAD OF BED. PATIENT WAS INSTRUCTED TO USE L UE TO HOLD BED RAIL TO PULL AND R UE TO PUSH OFF OF BED,. PATIENT WAS INSTRUCTED TO PERFORM B UE THERE EX TO INCREASE STRENGTH FOR CARRYOVER WITH ADLS. PATIENT WAS INSTRUCTED TO USE B UE TO PULL HIMSELF UP IN BED AND TO USE TO ROLL IN BED TO INCREASE MOBILITY AND UE STRENGTH. PATIENT WAS ALSO INSTRUCTED TO ROLL R AND TO L TO TAKE PRESSURE OFF OF BOTTOM.   Follow Up Recommendations       Equipment Recommendations       Recommendations for Other Services      Precautions / Restrictions Precautions Precautions: Fall Restrictions Weight Bearing Restrictions: Yes       Mobility Bed Mobility         Supine to sit: Mod assist     General bed mobility comments: cues for technique  Transfers                      Balance     Sitting balance-Leahy Scale: Fair                                     ADL either performed or assessed with clinical judgement   ADL                                                Vision       Perception     Praxis      Cognition Arousal/Alertness: Awake/alert Behavior During Therapy: WFL for tasks assessed/performed Overall Cognitive Status: Within Functional Limits for tasks assessed                                          Exercises     Shoulder Instructions       General Comments      Pertinent Vitals/ Pain       Pain Assessment: 0-10 Faces Pain Scale: Hurts a little bit Pain Location:  (L LE) Pain Descriptors / Indicators: Sore Pain Intervention(s):  (Pt. states he does not want pain medicine)  Home Living  Prior Functioning/Environment              Frequency           Progress Toward Goals  OT Goals(current goals can now be found in the care plan section)  Progress towards OT goals: Progressing toward goals  Acute Rehab OT Goals Patient Stated Goal:  (Pt. agrees he is not ready to go home, agreeable to cir)  Plan      Co-evaluation                 End of Session        Activity Tolerance     Patient Left     Nurse Communication          Time: 1610-9604 OT Time Calculation (min): 53 min  Charges: OT General Charges $OT Visit: 1 Procedure OT Treatments $Self Care/Home Management : 8-22 mins $Therapeutic Activity: 38-52 mins  6 CLICKS - CLINICAL JUDGEMENT   Jeffery [name] 12/26/2016, 11:30 AM

## 2016-12-26 NOTE — Progress Notes (Addendum)
Nutrition Follow-up  DOCUMENTATION CODES:   Non-severe (moderate) malnutrition in context of chronic illness, Obesity unspecified  INTERVENTION:  Discontinue prostat.  Continue Ensure Enlive po BID, each supplement provides 350 kcal and 20 grams of protein.  Continue Nepro Shake po once daily, each supplement provides 425 kcal and 19 grams protein.  Encourage adequate PO intake.   NUTRITION DIAGNOSIS:   Malnutrition (moderate) related to chronic illness (end stage cirrhosis) as evidenced by moderate depletion of body fat, moderate depletions of muscle mass; ongoing  GOAL:   Patient will meet greater than or equal to 90% of their needs; progressing  MONITOR:   PO intake, Supplement acceptance, Diet advancement, Labs, Weight trends, Skin, I & O's  REASON FOR ASSESSMENT:   Malnutrition Screening Tool    ASSESSMENT:   66 y.o. male with medical history significant for end-stage liver cirrhosis, insulin-dependent diabetes mellitus, and chronic kidney disease stage III who presents from his nursing facility for evaluation of nausea, vomiting, and generalized weakness.    Procedure (4/7): Left below-knee amputation  Meal completion has been 15-40%. Pt reports experiencing early satiety at meals. Pt reports appetite has been varied. He has been consuming his Ensure shakes and enjoys them, however has been refusing Prostat. RD to modify orders and order Nepro Shake to aid in caloric and protein needs. Pt encouraged to eat his foods at meals and to drink his supplements. Pt educated on the importance of protein on wound healing.   Nutrition-Focused physical exam completed. Findings are moderate fat depletion, moderate muscle depletion, and mild edema.   Labs and medications reviewed.   Diet Order:  Diet renal/carb modified with fluid restriction Diet-HS Snack? Nothing; Room service appropriate? Yes; Fluid consistency: Thin  Skin:   (Incision on L leg, non pressure wound on  buttocks)  Last BM:  4/10  Height:   Ht Readings from Last 1 Encounters:  12/19/16  (1.854 m)    Weight:   Wt Readings from Last 1 Encounters:  12/25/16 232 lb (105.2 kg)    Ideal Body Weight:  77 kg (adjusted for R AKA)  BMI:  Body mass index is 30.61 kg/m.  Estimated Nutritional Needs:   Kcal:  2200-2400  Protein:  105-120 grams  Fluid:  Per MD  EDUCATION NEEDS:   No education needs identified at this time  Roslyn Smiling, MS, RD, LDN Pager # [phone] After hours/ weekend pager # [phone]

## 2016-12-26 NOTE — Consult Note (Signed)
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 Referring Physician: Dr. Malachi Bonds.    HPI: Jeffery [name] is a 66 y.o. 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 He was discharged to SNF for rehab  but was readmitted on 12/18/16 with N/V, weakness and acute renal failure. He was treated with IVF and started on IV antibiotics as reports of recent staph infection. MRI of foot done revealing osteomyelitis with septic arthritis and myofasciitis.  Nephrology consulted for input on acute on chronic renal failure and felt that renal failure multifactorial and is following for input. 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 Post op lethargy resolving and therapy ongoing. CIR recommended due to significant deficits in mobility and ability to carry out ADL tasks.   Lives alone but was independent and working prior to Feb. He is motivated to get therapy to return to to prior level. Has friends who can check in/assist after discharge.     Review of Systems  HENT: Negative for hearing loss and tinnitus.   Eyes: Negative for blurred vision and double vision.  Respiratory: Negative for cough and shortness of breath.   Cardiovascular: Negative for chest pain and palpitations.  Gastrointestinal: Positive for constipation. Negative for heartburn and nausea.  Genitourinary: Negative for dysuria and urgency.  Musculoskeletal: Negative for back pain, myalgias and neck pain.  Skin: Negative for itching and rash.  Neurological: Positive for dizziness (with activity). Negative for tingling, sensory change and headaches.  Psychiatric/Behavioral: Negative for depression. The patient is not nervous/anxious and does not have insomnia.      Past Medical History:  Diagnosis Date  . Arthritis   . Asthma     . Bilateral cataracts   . Blurred vision    improved now  . Cholelithiasis   . Diabetes mellitus    type II  . Eczema    inner wrist  . Hypertension   . Muscle cramps   . Necrotizing fasciitis (HCC)   . Neuropathy (HCC)   . Rash    wrist and hands since late june  . Retinal degeneration     Past Surgical History:  Procedure Laterality Date  . ABOVE KNEE LEG AMPUTATION     right  . AMPUTATION Left 12/23/2016   Procedure: AMPUTATION BELOW KNEE;  Surgeon: Eldred Manges, MD;  Location: [facility] OR;  Service: Orthopedics;  Laterality: Left;  . APPLICATION OF WOUND VAC Left 11/06/2016   Procedure: APPLICATION OF WOUND VAC;  Surgeon: Eldred Manges, MD;  Location: [facility] OR;  Service: Orthopedics;  Laterality: Left;  . COLONOSCOPY WITH PROPOFOL N/A 05/18/2016   Procedure: COLONOSCOPY WITH PROPOFOL;  Surgeon: Rachael Fee, MD;  Location: [facility] ENDOSCOPY;  Service: Endoscopy;  Laterality: N/A;  . ESOPHAGOGASTRODUODENOSCOPY (EGD) WITH PROPOFOL N/A 05/18/2016   Procedure: ESOPHAGOGASTRODUODENOSCOPY (EGD) WITH PROPOFOL;  Surgeon: Rachael Fee, MD;  Location: [facility] ENDOSCOPY;  Service: Endoscopy;  Laterality: N/A;  . ESOPHAGOGASTRODUODENOSCOPY (EGD) WITH PROPOFOL N/A 11/01/2016   Procedure: ESOPHAGOGASTRODUODENOSCOPY (EGD) WITH PROPOFOL;  Surgeon: Napoleon Form, MD;  Location: [facility] ENDOSCOPY;  Service: Endoscopy;  Laterality: N/A;  . EYE SURGERY  09/2011   bilateral for retinopathy - laser eye surgery  . I&D EXTREMITY Left 10/25/2016   Procedure: LEFT FOOT DEBRIDEMENT RAY AMPUTATION PLACEMENT OF WOUND VAC;  Surgeon: Eldred Manges,  MD;  Location: [facility] OR;  Service: Orthopedics;  Laterality: Left;  . I&D EXTREMITY Left 11/06/2016   Procedure: IRRIGATION AND DEBRIDEMENT EXTREMITY/LEFT FOOT;  Surgeon: Eldred Manges, MD;  Location: [facility] OR;  Service: Orthopedics;  Laterality: Left;  . IR PARACENTESIS  12/20/2016  . left metatarsal  2003   5th  . TOE AMPUTATION     Left big toe    Family History  Problem Relation  Age of Onset  . Cancer Mother     unknown type    Social History:  Lives alone. Independent at wheelchair level. Professor at [employer]. He  reports that he has never smoked. He has never used smokeless tobacco. He reports that he does not drink alcohol or use drugs.    Allergies: No Known Allergies    Medications Prior to Admission  Medication Sig Dispense Refill  . atorvastatin (LIPITOR) 40 MG tablet Take 40 mg by mouth daily.  2  . brimonidine (ALPHAGAN P) 0.1 % SOLN Place 1 drop into both eyes daily.    . furosemide (LASIX) 40 MG tablet Take 40 mg by mouth daily.    . insulin aspart (NOVOLOG FLEXPEN) 100 UNIT/ML FlexPen Inject before meals and at bedtime per sliding scale: if BGL is: 200 - 250 = 2 units; 251 - 300 = 4 units; 301 - 350 = 6 units; 351 - 400 = 8 units, if greater than 400 notify MD    . loperamide (IMODIUM) 2 MG capsule Take 1 capsule (2 mg total) by mouth as needed for diarrhea or loose stools. 30 capsule 0  . methocarbamol (ROBAXIN) 500 MG tablet Take 0.5 tablets (250 mg total) by mouth every 8 (eight) hours as needed for muscle spasms. 15 tablet 0  . metoCLOPramide (REGLAN) 5 MG tablet Take 1 tablet (5 mg total) by mouth every 6 (six) hours as needed for nausea. 20 tablet 0  . Multiple Vitamin (MULTIVITAMIN WITH MINERALS) TABS tablet Take 1 tablet by mouth daily.    . nadolol (CORGARD) 20 MG tablet Take 1 tablet (20 mg total) by mouth daily. 30 tablet 0  . oxyCODONE (ROXICODONE) 5 MG immediate release tablet Take one tablet by mouth every 4 hours as needed for pain (Patient taking differently: Take 5 mg by mouth every 4 (four) hours as needed for severe pain. ) 180 tablet 0  . pantoprazole (PROTONIX) 40 MG tablet Take 1 tablet (40 mg total) by mouth 2 (two) times daily. 60 tablet 0  . saccharomyces boulardii (FLORASTOR) 250 MG capsule Take 1 capsule (250 mg total) by mouth 2 (two) times daily. 10 capsule 0  . spironolactone (ALDACTONE) 100 MG tablet Take 100 mg by mouth  daily.     . sucralfate (CARAFATE) 1 GM/10ML suspension Take 10 mLs (1 g total) by mouth 4 (four) times daily -  with meals and at bedtime. (Patient taking differently: Take 1 g by mouth 4 (four) times daily -  before meals and at bedtime. ) 420 mL 0  . [EXPIRED] sulfamethoxazole-trimethoprim (BACTRIM DS,SEPTRA DS) 800-160 MG tablet Take 1 tablet by mouth 2 (two) times daily. FOR 10 DAYS (Start date: 12/12/16)    . UNABLE TO FIND MedPass: Drink 237 ml's by mouth three times a day    . UNABLE TO FIND Protein Liquid: Drink 30 ml's by mouth three times a day for wound healing    . [EXPIRED] vitamin C (ASCORBIC ACID) 500 MG tablet Take 500 mg by mouth 2 (two) times  daily. FOR 45 DAYS (Start date: 11/09/16)    . [EXPIRED] Zinc Sulfate (ZINC-220 PO) Take 220 mg by mouth daily.     . Amino Acids-Protein Hydrolys (FEEDING SUPPLEMENT, PRO-STAT SUGAR FREE 64,) LIQD Take 30 mLs by mouth 3 (three) times daily. (Patient not taking: Reported on 12/18/2016) 900 mL 0  . BISMUTH TRIBROMOPH-PETROLATUM EX Apply to right foot topically every Tuesday, Thursday and Saturday    . collagenase (SANTYL) ointment Apply to right heel topically on Tuesday, Thursday and Saturday    . ipratropium-albuterol (DUONEB) 0.5-2.5 (3) MG/3ML SOLN     . wound dressings gel Apply topically as needed for wound care. TheraHoney Gel - Apply to left heel and left plantar foot topically every day      Home: Home Living Family/patient expects to be discharged to:: Skilled nursing facility Living Arrangements: Alone Available Help at Discharge: Friend(s) Type of Home: House Home Access: Ramped entrance Home Layout: One level Bathroom Shower/Tub: Psychologist, counselling, Door [employer] Toilet: Handicapped height Bathroom Accessibility: Yes Home Equipment: Information systems manager - built in, Wheelchair - manual Additional Comments: works as Psychologist, prison and probation services at [employer]  Functional History: Prior Function Level of Independence: Needs Technical sales engineer / Transfers  Assistance Needed: Pt able to transfer I untill about 2 months ago ADL's / Homemaking Assistance Needed: Pt performing ADLs till 2 months ago during his toe amputation Comments: Pt has been immobile ~ 2 month. Pt was transfering  Functional Status:  Mobility: Bed Mobility Overal bed mobility: Needs Assistance Bed Mobility: Supine to Sit Rolling: Max assist, +2 for physical assistance Supine to sit: Mod assist General bed mobility comments: cues for technique Transfers Overall transfer level: Needs assistance Transfers: Counselling psychologist transfers: Max assist, +2 physical assistance General transfer comment: Support trunk while positioning in long sitting--hamstring tightness making sitting more difficult.  Assisted pt with pad and truncal support to scoot posteriorly into the recliner Ambulation/Gait General Gait Details: Not Applicable    ADL: ADL Overall ADL's : Needs assistance/impaired Eating/Feeding: Set up, Sitting Grooming: Set up, Bed level Upper Body Bathing: Minimal assistance, Bed level Lower Body Bathing: Moderate assistance, Bed level Upper Body Dressing : Minimal assistance, Bed level Lower Body Dressing: Maximal assistance, Bed level Functional mobility during ADLs: Maximal assistance, +2 for physical assistance General ADL Comments: Pt will require strengthing of BUE to increase independence in ADLs and functional transfers  Cognition: Cognition Overall Cognitive Status: Within Functional Limits for tasks assessed Orientation Level: Oriented X4 Cognition Arousal/Alertness: Awake/alert Behavior During Therapy: WFL for tasks assessed/performed Overall Cognitive Status: Within Functional Limits for tasks assessed Area of Impairment: Memory, Following commands, Safety/judgement, Problem solving Memory: Decreased short-term memory Following Commands: Follows one step commands with increased time, Follows one step commands  consistently Safety/Judgement: Decreased awareness of safety, Decreased awareness of deficits Problem Solving: Slow processing, Decreased initiation, Difficulty sequencing, Requires verbal cues, Requires tactile cues  Blood pressure 121/62, pulse 70, temperature 98.2 F (36.8 C), temperature source Oral, resp. rate 18, height  (1.854 m), weight 105.2 kg (232 lb), SpO2 98 %. Physical Exam  Nursing note and vitals reviewed. Constitutional: He is oriented to person, place, and time. He appears well-developed and well-nourished.  Pale, ill appearing male.   HENT:  Head: Normocephalic and atraumatic.  Eyes: Conjunctivae are normal. Pupils are equal, round, and reactive to light.  Neck: Normal range of motion. Neck supple.  Cardiovascular: Normal rate and regular rhythm.   Murmur heard. Respiratory: Effort normal and breath sounds normal. No  stridor. No respiratory distress. He has no wheezes.  GI: Soft. Bowel sounds are normal. He exhibits distension. There is no tenderness.  Ascites?  Musculoskeletal:  Prior R-AKA well healed.  L-BKA with compressive dressing.   Neurological: He is alert and oriented to person, place, and time.  Skin: Skin is warm and dry.    Results for orders placed or performed during the hospital encounter of 12/18/16 (from the past 24 hour(s))  Glucose, capillary     Status: None   Collection Time: 12/25/16  4:44 PM  Result Value Ref Range   Glucose-Capillary 99 65 - 99 mg/dL  Glucose, capillary     Status: None   Collection Time: 12/25/16 10:15 PM  Result Value Ref Range   Glucose-Capillary 98 65 - 99 mg/dL  Renal function panel     Status: Abnormal   Collection Time: 12/26/16  6:22 AM  Result Value Ref Range   Sodium 135 135 - 145 mmol/L   Potassium 4.6 3.5 - 5.1 mmol/L   Chloride 107 101 - 111 mmol/L   CO2 21 (L) 22 - 32 mmol/L   Glucose, Bld 97 65 - 99 mg/dL   BUN 50 (H) 6 - 20 mg/dL   Creatinine, Ser 1.61 (H) 0.61 - 1.24 mg/dL   Calcium 8.3 (L)  8.9 - 10.3 mg/dL   Phosphorus 3.7 2.5 - 4.6 mg/dL   Albumin 2.2 (L) 3.5 - 5.0 g/dL   GFR calc non Af Amer 23 (L) >60 mL/min   GFR calc Af Amer 26 (L) >60 mL/min   Anion gap 7 5 - 15  Glucose, capillary     Status: None   Collection Time: 12/26/16  7:51 AM  Result Value Ref Range   Glucose-Capillary 93 65 - 99 mg/dL  Glucose, capillary     Status: Abnormal   Collection Time: 12/26/16 11:55 AM  Result Value Ref Range   Glucose-Capillary 162 (H) 65 - 99 mg/dL   No results found.  Assessment/Plan: Diagnosis: left BKA required because of osteomyelitis LLE. Hx of right AKA 1. Does the need for close, 24 hr/day medical supervision in concert with the patient's rehab needs make it unreasonable for this patient to be served in a less intensive setting? Yes 2. Co-Morbidities requiring supervision/potential complications: CKD, cirrhosis o fliver, pain 3. Due to bladder management, bowel management, safety, skin/wound care, disease management, medication administration, pain management and patient education, does the patient require 24 hr/day rehab nursing? Yes 4. Does the patient require coordinated care of a physician, rehab nurse, PT (1-2 hrs/day, 5 days/week) and OT (1-2 hrs/day, 5 days/week) to address physical and functional deficits in the context of the above medical diagnosis(es)? Yes Addressing deficits in the following areas: balance, endurance, locomotion, strength, transferring, bowel/bladder control, bathing, dressing, feeding, grooming, toileting and psychosocial support 5. Can the patient actively participate in an intensive therapy program of at least 3 hrs of therapy per day at least 5 days per week? Yes 6. The potential for patient to make measurable gains while on inpatient rehab is excellent 7. Anticipated functional outcomes upon discharge from inpatient rehab are modified independent  with PT, modified independent with OT, n/a with SLP. 8. Estimated rehab length of stay to reach  the above functional goals is: 7-10 days 9. Does the patient have adequate social supports and living environment to accommodate these discharge functional goals? Yes 10. Anticipated D/C setting: Home 11. Anticipated post D/C treatments: HH therapy and Outpatient therapy 12. Overall Rehab/Functional Prognosis:  excellent  RECOMMENDATIONS: This patient's condition is appropriate for continued rehabilitative care in the following setting: CIR Patient has agreed to participate in recommended program. Yes Note that insurance prior authorization may be required for reimbursement for recommended care.  Comment: Pt failed previously at SNF. 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 He is motivated to returning home and to work. Rehab Admissions Coordinator to follow up.  Thanks,  Ranelle Oyster, MD, Earlie Counts, PA-C 12/26/2016

## 2016-12-26 NOTE — Progress Notes (Signed)
Assessment/Plan: 1 AKI ?post infectious.  2 Cirrhosis w/ ascites 3 DM 4 PVD post new L BKA should improve 5 Lethargy meds, post anesthesia P follow chem   Subjective: Interval History:  Nothing new  Objective: Vital signs in last 24 hours: Temp:  [98.2 F (36.8 C)-98.4 F (36.9 C)] 98.2 F (36.8 C) (04/10 0916) Pulse Rate:  [63-83] 70 (04/10 0916) Resp:  [16-18] 18 (04/10 0916) BP: (98-123)/(48-62) 121/62 (04/10 0916) SpO2:  [95 %-98 %] 98 % (04/10 0916) Weight:  [105.2 kg (232 lb)] 105.2 kg (232 lb) (04/09 2202) Weight change: -0.454 kg (-1 lb)  Intake/Output from previous day: 04/09 0701 - 04/10 0700 In: 643 [P.O.:640; I.V.:3] Out: 906 [Urine:905; Stool:1] Intake/Output this shift: No intake/output data recorded.  General appearance: alert and cooperative GI: soft, non-tender; bowel sounds normal; no masses,  no organomegaly and ascites Extremities: BKA on L  Lab Results:  Recent Labs  12/24/16 0348  WBC 5.1  HGB 8.6*  HCT 26.4*  PLT 84*   BMET:  Recent Labs  12/25/16 0656 12/26/16 0622  NA 136 135  K 4.6 4.6  CL 107 107  CO2 21* 21*  GLUCOSE 100* 97  BUN 49* 50*  CREATININE 2.70* 2.74*  CALCIUM 8.4* 8.3*   No results for input(s): PTH in the last 72 hours. Iron Studies: No results for input(s): IRON, TIBC, TRANSFERRIN, FERRITIN in the last 72 hours. Studies/Results: No results found.  Scheduled: . sodium chloride   Intravenous Once  . atorvastatin  40 mg Oral Daily  . bisacodyl  10 mg Rectal Once  . brimonidine  1 drop Both Eyes Daily  . feeding supplement (ENSURE ENLIVE)  237 mL Oral BID BM  . feeding supplement (PRO-STAT SUGAR FREE 64)  30 mL Oral TID  . heparin subcutaneous  5,000 Units Subcutaneous Q8H  . insulin aspart  0-5 Units Subcutaneous QHS  . insulin aspart  0-9 Units Subcutaneous TID WC  . lactulose  10 g Oral TID  . midodrine  15 mg Oral TID WC  . multivitamin with minerals  1 tablet Oral Daily  . pantoprazole  40 mg Oral  BID  . rifaximin  550 mg Oral BID  . saccharomyces boulardii  250 mg Oral BID  . sodium chloride flush  3 mL Intravenous Q12H  . sucralfate  1 g Oral TID AC & HS     LOS: 8 days   Jeffery [name] C 12/26/2016,11:20 AM

## 2016-12-26 NOTE — Progress Notes (Signed)
PROGRESS NOTE  Jeffery [name]  ZOX:[id number] DOB: [birth year] DOA: 12/18/2016 PCP: Julian Hy, MD  Brief Narrative:   Jeffery [name] a 66 y.o.malewith medical history significant for end-stage liver cirrhosis, insulin-dependent diabetes mellitus, and chronic kidney disease stage III who presented from his nursing facility for evaluation of nausea, vomiting, and generalized weakness.  Patient was admitted to the hospital in February 2018 with osteomyelitis involving the left foot;he underwent first ray amputation with clean margins reported and was discharged to a skilled nursing facility. A culture reportedly grew staph and he wasplaced on Bactrim empirically on 12/12/2016. Since that time, he developed nausea with nonbloody nonbilious vomiting and progressive weakness.  He was found to have AKI with a serum creatinine of 3.27 up from 1.6 g/dL in March.  This is likely multifactorial due to infection, bactrim, and recent paracentesis.  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  MRI confirmed osteomyelitis, abscess and septic arthritis of the left foot.  Orthopedics, Dr. Ophelia Charter, to perform BKA of left leg on 4/7.  His mentation is improved and he is starting to eat better.  PM&R consulted for possible CIR.     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 Acute kidney injury superimposed on CKD stage III due to end-stage liver  disease, bactrim, recent large volume paracentesis, and Staph infection of the left foot.  Creatinine appears to have stabilized around 2.5-2.7. -  SCr initially 3.27, up from 1.6 in March 2018  -  Bactrim discontinued at admission -  Albumin, octreotide discontinued -  Continue midodrine -  C3 29 and C4 5, very low suggesting complement mediated nephropathy related to Staph infection of left foot -  Source control as below -  Nephrology to please address outpatient diuretic therapy  2. 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 Liver cirrhosis  - Has esophageal varices, grade I by EGD in February 2018  - Continue Protonix   Nausea and vomiting, likely related to AKI and infection, resolving -  Continue antiemetics  Hepatic and metabolic encephalopathy, resolving -  Continue increased lactulose  -  Continue rifaximin -  US guided diagnostic paracentesis:  SBP unlikely given low WBC count -  Body fluid culture no growth final  4. Hyperkalemia, resolved with IVF and kayexelate  5. Hyponatremia, mild and asymptomatic in setting of cirrhosis  6. Normocytic anemia, thrombocytopenia due to cirrhosis -  Platelets trending up slightly -   Hemoglobin stable  7. Hypotension - improved with hydration  DVT prophylaxis:  Heparin  Code Status:  DNR Family Communication:  Patient alone  Disposition Plan:  BKA on 4/7.  Eating better and more alert today.  Awaiting insurance approval for CIR.    Consultants:   Nephrology  Wound  care  Orthopedic surgery, Dr. Ophelia Charter  Procedures:  US guided paracentesis  Left BKA on 4/7  Antimicrobials:  Anti-infectives    Start     Dose/Rate Route Frequency Ordered Stop   12/22/16 1715  piperacillin-tazobactam (ZOSYN) IVPB 3.375 g  Status:  Discontinued     3.375 g 12.5 mL/hr over  240 Minutes Intravenous Every 8 hours 12/22/16 1704 12/24/16 0723   12/21/16 0600  vancomycin (VANCOCIN) 1,500 mg in sodium chloride 0.9 % 500 mL IVPB  Status:  Discontinued     1,500 mg 250 mL/hr over 120 Minutes Intravenous Every 48 hours 12/18/16 2353 12/24/16 0723   12/20/16 2200  rifaximin (XIFAXAN) tablet 550 mg     550 mg Oral 2 times daily 12/20/16 1801     12/18/16 2330  vancomycin (VANCOCIN) 2,000 mg in sodium chloride 0.9 % 500 mL IVPB     2,000 mg 250 mL/hr over 120 Minutes Intravenous  Once 12/18/16 2325 12/19/16 0159       Subjective:  Had several BMs yesterday and feeling much better.  Nausea improved.  Eating slightly more.  Left leg hurting less.  Abdomen still swollen.      Objective: Vitals:   12/25/16 1752 12/25/16 2202 12/26/16 0544 12/26/16 0916  BP: 123/60 (!) 99/55 (!) 98/48 121/62  Pulse: 83 77 63 70  Resp: Temp: 98.2 F (36.8 C) 98.4 F (36.9 C) 98.3 F (36.8 C) 98.2 F (36.8 C)  TempSrc: Oral   Oral  SpO2: 95% 97% 98% 98%  Weight:  105.2 kg (232 lb)    Height:        Intake/Output Summary (Last 24 hours) at 12/26/16 1440 Last data filed at 12/26/16 1000  Gross per 24 hour  Intake              440 ml  Output              781 ml  Net             -341 ml   Filed Weights   12/23/16 2104 12/24/16 2230 12/25/16 2202  Weight: 110.1 kg (242 lb 11.2 oz) 105.7 kg (233 lb) 105.2 kg (232 lb)    Examination:  General exam:  Adult male, awake, speech more clear and rapid today HEENT:  NCAT, MMM Respiratory system:  No focal rales, rhonchi, or wheeze anteriorly Cardiovascular system: Regular rate and rhythm, normal S1/S2. No murmurs, rubs, gallops or clicks.  Warm extremities Gastrointestinal system: Normal active bowel sounds, soft, moderate to severe distension, nontender MSK:  Right AKA.  Left BKA, stump wrapped.  Did not remove dressing for examination today Neuro:  Grossly moves upper extremities   Data Reviewed: I have personally  reviewed following labs and imaging studies  CBC:  Recent Labs Lab 12/20/16 0726 12/20/16 0925 12/20/16 1605 12/21/16 0527 12/23/16 1034 12/24/16 0348  WBC 5.3 5.4  --  4.3 5.7 5.1  NEUTROABS  --   --  6.4  --   --   --   HGB 8.2* 8.3*  --  8.0* 8.8* 8.6*  HCT 24.1* 24.7*  --  24.4* 26.9* 26.4*  MCV 95.6 96.5  --  98.0 100.0 99.6  PLT 58* 57*  --  46* REPEATED TO VERIFY 84*   Basic Metabolic Panel:  Recent Labs Lab 12/22/16 0504 12/23/16 0149 12/24/16 0348 12/25/16 0656 12/26/16 0622  NA 134* 136 135 136 135  K 4.2 4.5 4.5 4.6 4.6  CL 103 104 106 107 107  CO2 22 23 19* 21* 21*  GLUCOSE 133* 139* 107* 100* 97  BUN 59* 54* 49* 49* 50*  CREATININE 2.91* 2.76* 2.51* 2.70* 2.74*  CALCIUM 8.4* 8.6* 8.3* 8.4* 8.3*  PHOS 3.5 3.2 3.4 3.7 3.7   GFR: Estimated Creatinine Clearance: 33.8 mL/min (A) (by C-G formula based on SCr of 2.74 mg/dL (H)). Liver Function Tests:  Recent Labs Lab 12/21/16 0527 12/22/16 0504 12/23/16 0149 12/24/16 0348 12/25/16 0656 12/26/16 0622  AST 35  --   --   --   --   --   ALT 15*  --   --   --   --   --   ALKPHOS 48  --   --   --   --   --   BILITOT 1.6*  --   --   --   --   --   PROT 6.7  --   --   --   --   --   ALBUMIN 1.9*  1.9* 2.4* 2.5* 2.2* 2.3* 2.2*   No results for input(s): LIPASE, AMYLASE in the last 168 hours.  Recent Labs Lab 12/20/16 0726  AMMONIA 27   Coagulation Profile:  Recent Labs Lab 12/23/16 1034  INR 2.62   Cardiac Enzymes: No results for input(s): CKTOTAL, CKMB, CKMBINDEX, TROPONINI in the last 168 hours. BNP (last 3 results) No results for input(s): PROBNP in the last 8760 hours. HbA1C: No results for input(s): HGBA1C in the last 72 hours. CBG:  Recent Labs Lab 12/25/16 1155 12/25/16 1644 12/25/16 2215 12/26/16 0751 12/26/16 1155  GLUCAP 96 99 98 93 162*   Lipid Profile: No results for input(s): CHOL, HDL, LDLCALC, TRIG, CHOLHDL, LDLDIRECT in the last 72 hours. Thyroid Function  Tests: No results for input(s): TSH, T4TOTAL, FREET4, T3FREE, THYROIDAB in the last 72 hours. Anemia Panel: No results for input(s): VITAMINB12, FOLATE, FERRITIN, TIBC, IRON, RETICCTPCT in the last 72 hours. Urine analysis:    Component Value Date/Time   COLORURINE YELLOW 12/18/2016 2200   APPEARANCEUR HAZY (A) 12/18/2016 2200   LABSPEC 1.015 12/18/2016 2200   PHURINE 5.0 12/18/2016 2200   GLUCOSEU NEGATIVE 12/18/2016 2200   HGBUR SMALL (A) 12/18/2016 2200   BILIRUBINUR NEGATIVE 12/18/2016 2200   KETONESUR NEGATIVE 12/18/2016 2200   PROTEINUR 30 (A) 12/18/2016 2200   NITRITE NEGATIVE 12/18/2016 2200   LEUKOCYTESUR NEGATIVE 12/18/2016 2200   Sepsis Labs: (procalcitonin:4,lacticidven:4)  ) Recent Results (from the past 240 hour(s))  MRSA PCR Screening     Status: Abnormal   Collection Time: 12/19/16  1:24 AM  Result Value Ref Range Status   MRSA by PCR POSITIVE (A) NEGATIVE Final    Comment:        The GeneXpert MRSA Assay (FDA approved for NASAL specimens only), is one component of a comprehensive MRSA colonization surveillance program. It is not intended to diagnose MRSA infection nor to guide or monitor treatment for MRSA infections. RESULT CALLED TO, READ BACK BY AND VERIFIED WITH: C DAVIS,RN  12/19/16 MKELLY,MLT   Gram stain     Status: None   Collection Time: 12/20/16  3:11 PM  Result Value Ref Range Status   Specimen Description FLUID PERITONEAL  Final   Special Requests NONE  Final   Gram Stain   Final    ABUNDANT WBC PRESENT,BOTH PMN AND MONONUCLEAR NO ORGANISMS SEEN    Report Status 12/20/2016 FINAL  Final  Culture, body fluid-bottle  Status: None   Collection Time: 12/20/16  3:11 PM  Result Value Ref Range Status   Specimen Description FLUID PERITONEAL  Final   Special Requests BOTTLES DRAWN AEROBIC AND ANAEROBIC 10CC  Final   Culture NO GROWTH 6 DAYS  Final   Report Status 12/26/2016 FINAL  Final      Radiology Studies: No  results found.   Scheduled Meds: . sodium chloride   Intravenous Once  . atorvastatin  40 mg Oral Daily  . bisacodyl  10 mg Rectal Once  . brimonidine  1 drop Both Eyes Daily  . feeding supplement (ENSURE ENLIVE)  237 mL Oral BID BM  . feeding supplement (PRO-STAT SUGAR FREE 64)  30 mL Oral TID  . heparin subcutaneous  5,000 Units Subcutaneous Q8H  . insulin aspart  0-5 Units Subcutaneous QHS  . insulin aspart  0-9 Units Subcutaneous TID WC  . lactulose  10 g Oral TID  . midodrine  15 mg Oral TID WC  . multivitamin with minerals  1 tablet Oral Daily  . pantoprazole  40 mg Oral BID  . rifaximin  550 mg Oral BID  . saccharomyces boulardii  250 mg Oral BID  . sodium chloride flush  3 mL Intravenous Q12H  . sucralfate  1 g Oral TID AC & HS   Continuous Infusions:    LOS: 8 days    Time spent: 30 min    Renae Fickle, MD [facility] Pager [phone]  If 7PM-7AM, please contact night-coverage [email or web address] Password [email or web address] 12/26/2016, 2:40 PM

## 2016-12-27 ENCOUNTER — Inpatient Hospital Stay (HOSPITAL_COMMUNITY)
Admission: RE | Admit: 2016-12-27 | Discharge: 2017-01-03 | DRG: 560 | Disposition: A | Discharge status: Other Acute Inpatient Hospital | Payer: BC Managed Care – PPO | Source: Intra-hospital | Attending: Physical Medicine & Rehabilitation | Admitting: Physical Medicine & Rehabilitation

## 2016-12-27 DIAGNOSIS — E1142 Type 2 diabetes mellitus with diabetic polyneuropathy: Secondary | ICD-10-CM

## 2016-12-27 DIAGNOSIS — R52 Pain, unspecified: Secondary | ICD-10-CM

## 2016-12-27 DIAGNOSIS — Z515 Encounter for palliative care: Secondary | ICD-10-CM

## 2016-12-27 DIAGNOSIS — E1151 Type 2 diabetes mellitus with diabetic peripheral angiopathy without gangrene: Secondary | ICD-10-CM | POA: Diagnosis present

## 2016-12-27 DIAGNOSIS — E871 Hypo-osmolality and hyponatremia: Secondary | ICD-10-CM | POA: Diagnosis present

## 2016-12-27 DIAGNOSIS — N189 Chronic kidney disease, unspecified: Secondary | ICD-10-CM | POA: Diagnosis not present

## 2016-12-27 DIAGNOSIS — Z794 Long term (current) use of insulin: Secondary | ICD-10-CM

## 2016-12-27 DIAGNOSIS — D689 Coagulation defect, unspecified: Secondary | ICD-10-CM | POA: Diagnosis present

## 2016-12-27 DIAGNOSIS — T8789 Other complications of amputation stump: Secondary | ICD-10-CM | POA: Diagnosis not present

## 2016-12-27 DIAGNOSIS — Z79899 Other long term (current) drug therapy: Secondary | ICD-10-CM

## 2016-12-27 DIAGNOSIS — R269 Unspecified abnormalities of gait and mobility: Secondary | ICD-10-CM

## 2016-12-27 DIAGNOSIS — N184 Chronic kidney disease, stage 4 (severe): Secondary | ICD-10-CM | POA: Diagnosis present

## 2016-12-27 DIAGNOSIS — K7460 Unspecified cirrhosis of liver: Secondary | ICD-10-CM | POA: Diagnosis present

## 2016-12-27 DIAGNOSIS — R059 Cough, unspecified: Secondary | ICD-10-CM

## 2016-12-27 DIAGNOSIS — N179 Acute kidney failure, unspecified: Secondary | ICD-10-CM | POA: Diagnosis present

## 2016-12-27 DIAGNOSIS — E1140 Type 2 diabetes mellitus with diabetic neuropathy, unspecified: Secondary | ICD-10-CM | POA: Diagnosis present

## 2016-12-27 DIAGNOSIS — D62 Acute posthemorrhagic anemia: Secondary | ICD-10-CM | POA: Diagnosis present

## 2016-12-27 DIAGNOSIS — E1122 Type 2 diabetes mellitus with diabetic chronic kidney disease: Secondary | ICD-10-CM | POA: Diagnosis present

## 2016-12-27 DIAGNOSIS — Z89512 Acquired absence of left leg below knee: Secondary | ICD-10-CM | POA: Diagnosis not present

## 2016-12-27 DIAGNOSIS — I129 Hypertensive chronic kidney disease with stage 1 through stage 4 chronic kidney disease, or unspecified chronic kidney disease: Secondary | ICD-10-CM | POA: Diagnosis present

## 2016-12-27 DIAGNOSIS — R188 Other ascites: Secondary | ICD-10-CM | POA: Diagnosis present

## 2016-12-27 DIAGNOSIS — R109 Unspecified abdominal pain: Secondary | ICD-10-CM | POA: Diagnosis present

## 2016-12-27 DIAGNOSIS — S88112A Complete traumatic amputation at level between knee and ankle, left lower leg, initial encounter: Secondary | ICD-10-CM | POA: Diagnosis present

## 2016-12-27 DIAGNOSIS — R05 Cough: Secondary | ICD-10-CM

## 2016-12-27 DIAGNOSIS — Z89611 Acquired absence of right leg above knee: Secondary | ICD-10-CM | POA: Diagnosis not present

## 2016-12-27 DIAGNOSIS — J45909 Unspecified asthma, uncomplicated: Secondary | ICD-10-CM | POA: Diagnosis present

## 2016-12-27 DIAGNOSIS — I959 Hypotension, unspecified: Secondary | ICD-10-CM | POA: Diagnosis present

## 2016-12-27 DIAGNOSIS — D638 Anemia in other chronic diseases classified elsewhere: Secondary | ICD-10-CM | POA: Diagnosis present

## 2016-12-27 DIAGNOSIS — K7290 Hepatic failure, unspecified without coma: Secondary | ICD-10-CM | POA: Diagnosis present

## 2016-12-27 DIAGNOSIS — R531 Weakness: Secondary | ICD-10-CM

## 2016-12-27 DIAGNOSIS — Z4781 Encounter for orthopedic aftercare following surgical amputation: Principal | ICD-10-CM

## 2016-12-27 DIAGNOSIS — E440 Moderate protein-calorie malnutrition: Secondary | ICD-10-CM | POA: Diagnosis present

## 2016-12-27 DIAGNOSIS — N170 Acute kidney failure with tubular necrosis: Secondary | ICD-10-CM | POA: Diagnosis not present

## 2016-12-27 DIAGNOSIS — R197 Diarrhea, unspecified: Secondary | ICD-10-CM | POA: Diagnosis present

## 2016-12-27 LAB — TYPE AND SCREEN
ABO/RH(D): A POS
ANTIBODY SCREEN: NEGATIVE
UNIT DIVISION: 0
UNIT DIVISION: 0

## 2016-12-27 LAB — RENAL FUNCTION PANEL
ANION GAP: 4 — AB (ref 5–15)
Albumin: 2.1 g/dL — ABNORMAL LOW (ref 3.5–5.0)
BUN: 51 mg/dL — ABNORMAL HIGH (ref 6–20)
CALCIUM: 8.3 mg/dL — AB (ref 8.9–10.3)
CHLORIDE: 106 mmol/L (ref 101–111)
CO2: 24 mmol/L (ref 22–32)
CREATININE: 2.84 mg/dL — AB (ref 0.61–1.24)
GFR, EST AFRICAN AMERICAN: 25 mL/min — AB (ref >60)
GFR, EST NON AFRICAN AMERICAN: 22 mL/min — AB (ref >60)
Glucose, Bld: 177 mg/dL — ABNORMAL HIGH (ref 65–99)
Phosphorus: 3 mg/dL (ref 2.5–4.6)
Potassium: 4.4 mmol/L (ref 3.5–5.1)
Sodium: 134 mmol/L — ABNORMAL LOW (ref 135–145)

## 2016-12-27 LAB — BPAM RBC
BLOOD PRODUCT EXPIRATION DATE: 201804252359
Blood Product Expiration Date: 201804252359
Unit Type and Rh: 6200
Unit Type and Rh: 6200

## 2016-12-27 LAB — GLUCOSE, CAPILLARY
GLUCOSE-CAPILLARY: 163 mg/dL — AB (ref 65–99)
Glucose-Capillary: 151 mg/dL — ABNORMAL HIGH (ref 65–99)
Glucose-Capillary: 157 mg/dL — ABNORMAL HIGH (ref 65–99)
Glucose-Capillary: 131 mg/dL — ABNORMAL HIGH (ref 65–99)

## 2016-12-27 MED ORDER — HEPARIN SODIUM (PORCINE) 5000 UNIT/ML IJ SOLN
5000.0000 [IU] | Freq: Three times a day (TID) | INTRAMUSCULAR | Status: DC
  Administered 2016-12-27 – 2017-01-01 (×14): 5000 [IU] via SUBCUTANEOUS
  Filled 2016-12-27 (×13): qty 1

## 2016-12-27 MED ORDER — BRIMONIDINE TARTRATE 0.15 % OP SOLN
1.0000 [drp] | Freq: Every day | OPHTHALMIC | Status: DC
  Administered 2016-12-28 – 2017-01-03 (×6): 1 [drp] via OPHTHALMIC

## 2016-12-27 MED ORDER — FUROSEMIDE 20 MG PO TABS: 20.0000 mg | ORAL_TABLET | Freq: Every day | ORAL | Status: DC

## 2016-12-27 MED ORDER — SPIRONOLACTONE 25 MG PO TABS: 25.0000 mg | ORAL_TABLET | Freq: Every day | ORAL | Status: DC

## 2016-12-27 MED ORDER — BISACODYL 10 MG RE SUPP: 10.0000 mg | Freq: Every day | RECTAL | Status: DC | PRN

## 2016-12-27 MED ORDER — PANTOPRAZOLE SODIUM 40 MG PO TBEC
40.0000 mg | DELAYED_RELEASE_TABLET | Freq: Two times a day (BID) | ORAL | Status: DC
  Administered 2016-12-27 – 2017-01-03 (×13): 40 mg via ORAL
  Filled 2016-12-27 (×14): qty 1

## 2016-12-27 MED ORDER — MIDODRINE HCL 5 MG PO TABS
15.0000 mg | ORAL_TABLET | Freq: Three times a day (TID) | ORAL | Status: DC
  Administered 2016-12-28 – 2017-01-03 (×19): 15 mg via ORAL
  Filled 2016-12-27 (×21): qty 3

## 2016-12-27 MED ORDER — ATORVASTATIN CALCIUM 40 MG PO TABS
40.0000 mg | ORAL_TABLET | Freq: Every day | ORAL | Status: DC
  Administered 2016-12-28 – 2017-01-01 (×4): 40 mg via ORAL
  Filled 2016-12-27 (×5): qty 1

## 2016-12-27 MED ORDER — PROMETHAZINE HCL 12.5 MG PO TABS
12.5000 mg | ORAL_TABLET | Freq: Four times a day (QID) | ORAL | Status: DC | PRN
  Administered 2016-12-30 (×2): 12.5 mg via ORAL
  Filled 2016-12-27 (×2): qty 1

## 2016-12-27 MED ORDER — ALBUMIN HUMAN 25 % IV SOLN
10.0000 g | Freq: Once | INTRAVENOUS | Status: DC
  Filled 2016-12-27: qty 50

## 2016-12-27 MED ORDER — RIFAXIMIN 550 MG PO TABS
550.0000 mg | ORAL_TABLET | Freq: Two times a day (BID) | ORAL | Status: DC
  Administered 2016-12-27 – 2017-01-03 (×13): 550 mg via ORAL
  Filled 2016-12-27 (×15): qty 1

## 2016-12-27 MED ORDER — IPRATROPIUM-ALBUTEROL 0.5-2.5 (3) MG/3ML IN SOLN: 3.0000 mL | RESPIRATORY_TRACT | Status: DC | PRN

## 2016-12-27 MED ORDER — ALBUMIN HUMAN 25 % IV SOLN
12.5000 g | Freq: Once | INTRAVENOUS | Status: AC
  Administered 2016-12-27: 12.5 g via INTRAVENOUS
  Filled 2016-12-27: qty 50

## 2016-12-27 MED ORDER — MIDODRINE HCL 5 MG PO TABS: 15.0000 mg | ORAL_TABLET | Freq: Three times a day (TID) | ORAL | Status: AC

## 2016-12-27 MED ORDER — PROMETHAZINE HCL 12.5 MG RE SUPP
12.5000 mg | Freq: Four times a day (QID) | RECTAL | Status: DC | PRN
  Filled 2016-12-27: qty 1

## 2016-12-27 MED ORDER — PROMETHAZINE HCL 25 MG/ML IJ SOLN
12.5000 mg | Freq: Four times a day (QID) | INTRAMUSCULAR | Status: DC | PRN
  Administered 2016-12-30: 12.5 mg via INTRAMUSCULAR
  Filled 2016-12-27 (×3): qty 1

## 2016-12-27 MED ORDER — METHOCARBAMOL 500 MG PO TABS
250.0000 mg | ORAL_TABLET | Freq: Three times a day (TID) | ORAL | Status: DC | PRN
  Administered 2016-12-30 – 2017-01-03 (×4): 250 mg via ORAL
  Filled 2016-12-27 (×4): qty 1

## 2016-12-27 MED ORDER — LACTULOSE 10 GM/15ML PO SOLN
30.0000 g | Freq: Once | ORAL | Status: AC
  Administered 2016-12-27: 30 g via ORAL

## 2016-12-27 MED ORDER — DIPHENHYDRAMINE HCL 12.5 MG/5ML PO ELIX: 12.5000 mg | ORAL_SOLUTION | Freq: Four times a day (QID) | ORAL | Status: DC | PRN

## 2016-12-27 MED ORDER — GUAIFENESIN-DM 100-10 MG/5ML PO SYRP: 5.0000 mL | ORAL_SOLUTION | Freq: Four times a day (QID) | ORAL | Status: DC | PRN

## 2016-12-27 MED ORDER — LACTULOSE 10 GM/15ML PO SOLN
10.0000 g | Freq: Three times a day (TID) | ORAL | Status: DC
  Administered 2016-12-27 – 2017-01-01 (×13): 10 g via ORAL
  Filled 2016-12-27 (×15): qty 15

## 2016-12-27 MED ORDER — POLYETHYLENE GLYCOL 3350 17 G PO PACK: 17.0000 g | PACK | Freq: Every day | ORAL | Status: DC | PRN

## 2016-12-27 MED ORDER — ENSURE ENLIVE PO LIQD: 237.0000 mL | Freq: Two times a day (BID) | ORAL | 12 refills | Status: AC

## 2016-12-27 MED ORDER — ACETAMINOPHEN 325 MG PO TABS
325.0000 mg | ORAL_TABLET | ORAL | Status: DC | PRN
  Administered 2016-12-30 – 2017-01-03 (×10): 650 mg via ORAL
  Filled 2016-12-27 (×9): qty 2

## 2016-12-27 MED ORDER — RIFAXIMIN 550 MG PO TABS: 550.0000 mg | ORAL_TABLET | Freq: Two times a day (BID) | ORAL | Status: DC

## 2016-12-27 MED ORDER — SUCRALFATE 1 GM/10ML PO SUSP
1.0000 g | Freq: Three times a day (TID) | ORAL | Status: DC
  Administered 2016-12-27 – 2017-01-03 (×25): 1 g via ORAL
  Filled 2016-12-27 (×30): qty 10

## 2016-12-27 MED ORDER — TRAZODONE HCL 50 MG PO TABS: 25.0000 mg | ORAL_TABLET | Freq: Every evening | ORAL | Status: DC | PRN

## 2016-12-27 MED ORDER — LACTULOSE 10 GM/15ML PO SOLN: 10.0000 g | Freq: Three times a day (TID) | ORAL | 0 refills | Status: DC

## 2016-12-27 NOTE — Progress Notes (Signed)
Pt admitted to room 4MW03 from [phone]. Pt arrived with ace wrap intact to Lt BKA, secured with plastic tape tight around entire circumference of stump; white, non-blanchable 'ring' around stump. Marissa Nestle PAC aware, observed. BKA dressing changed with PAC; large amounts of old and new drainage; dressings soaked off site, redressed.

## 2016-12-27 NOTE — Progress Notes (Signed)
Assessment/Plan: 1 AKI /CKD3 2 Cirrhosis w/ ascites 3 DM 4 PVD post new L BKA  P follow chem  Subjective: Interval History: disappointed about renal fct  Objective: Vital signs in last 24 hours: Temp:  [97.5 F (36.4 C)-98.4 F (36.9 C)] 98 F (36.7 C) (04/11 0911) Pulse Rate:  [63-66] 66 (04/11 0911) Resp:  [18-20] 18 (04/11 0911) BP: (102-119)/(48-63) 119/63 (04/11 0911) SpO2:  [95 %-99 %] 95 % (04/11 0911) Weight:  [104.8 kg (231 lb 0.7 oz)] 104.8 kg (231 lb 0.7 oz) (04/10 2050) Weight change: -0.435 kg (-15.3 oz)  Intake/Output from previous day: 04/10 0701 - 04/11 0700 In: 763 [P.O.:760; I.V.:3] Out: 175 [Urine:175] Intake/Output this shift: Total I/O In: 240 [P.O.:240] Out: -   General appearance: alert and cooperative GI: abnormal findings:  ascites and obese Extremities: edema 1+, bilat amps  Lab Results: No results for input(s): WBC, HGB, HCT, PLT in the last 72 hours. BMET:  Recent Labs  12/26/16 0622 12/27/16 0420  NA 135 134*  K 4.6 4.4  CL 107 106  CO2 21* 24  GLUCOSE 97 177*  BUN 50* 51*  CREATININE 2.74* 2.84*  CALCIUM 8.3* 8.3*   No results for input(s): PTH in the last 72 hours. Iron Studies: No results for input(s): IRON, TIBC, TRANSFERRIN, FERRITIN in the last 72 hours. Studies/Results: No results found.  Scheduled: . sodium chloride   Intravenous Once  . atorvastatin  40 mg Oral Daily  . bisacodyl  10 mg Rectal Once  . brimonidine  1 drop Both Eyes Daily  . feeding supplement (ENSURE ENLIVE)  237 mL Oral BID BM  . feeding supplement (NEPRO CARB STEADY)  237 mL Oral Q1500  . heparin subcutaneous  5,000 Units Subcutaneous Q8H  . insulin aspart  0-5 Units Subcutaneous QHS  . insulin aspart  0-9 Units Subcutaneous TID WC  . lactulose  10 g Oral TID  . midodrine  15 mg Oral TID WC  . multivitamin with minerals  1 tablet Oral Daily  . pantoprazole  40 mg Oral BID  . rifaximin  550 mg Oral BID  . saccharomyces boulardii  250 mg Oral  BID  . sodium chloride flush  3 mL Intravenous Q12H  . sucralfate  1 g Oral TID AC & HS     LOS: 9 days   Malai Lady C 12/27/2016,2:50 PM

## 2016-12-27 NOTE — Progress Notes (Addendum)
Patient discharged to to rm#[id number], gave report to the nurse and answered all her questions.  Transferred all his belongings with him.  Patient stable and ready for discharge.

## 2016-12-27 NOTE — Discharge Summary (Addendum)
Physician Discharge Summary  Jeffery [name] ZOX:[id number] DOB: Jul 26, 1951 DOA: 12/18/2016  PCP: Julian Hy, MD  Admit date: 12/18/2016 Discharge date: 12/27/2016  Disposition:  [facility]  Recommendations for Outpatient Follow-up:  1. Follow up with orthopedics Dr. Annell Greening in 1 week 2. Follow up with PCP in 2 weeks 3. Follow up with GI in 2-3 weeks 4. Follow up with nephrology in 2 weeks 5. Please obtain BMP/CBC in 1-2 weeks 6. Please monitor blood glucose ACHS  Discharge Condition: Stable  CODE STATUS: FULL   Brief/Interim Summary: HPI: Jeffery [name] is a 66 y.o. male with medical history significant for end-stage liver cirrhosis, insulin-dependent diabetes mellitus, and chronic kidney disease stage III who presents from his nursing facility for evaluation of nausea, vomiting, and generalized weakness. Patient was admitted to the hospital in February 2018 with osteomyelitis involving the left foot; he underwent first ray amputation with clean margins reported and was discharged to a skilled nursing facility. A culture reportedly grew staph and he was placed on Bactrim empirically on 12/12/2016. Since that time, he has been evaluated by orthopedics and an outpatient MRI was ordered but not clear if yet performed. Since that time, he has been noted to develop nausea with nonbloody nonbilious vomiting at his skilled nursing facility and has grown increasingly weak generally. Patient has complained of some nausea, but no other specific complaints. No fevers have been reported at the skilled nursing facility.   ED Course: Upon arrival to the ED, patient is found to be afebrile, saturating well on room air, blood pressure 90/65, and normal heart rate and respirations. EKG features a junctional rhythm with RBBB. Chest x-ray is negative for acute cardiopulmonary disease. Chemistry panel features a sodium of 1:30, potassium 5.5, BUN 63, and serum creatinine of 3.27, up from an apparent  baseline of 1.6. Albumin is only 1.9 and total bilirubin appears stable at 2.6. CBC features a stable normocytic anemia with hemoglobin of 10.0 and a stable thrombocytopenia with platelets 87,000. Troponin is within the normal limits, BNP is elevated to 213, and urinalysis is unremarkable. Patient was given 2 L normal saline in the emergency department. He remained hemodynamically stable and in no acute respiratory distress. He will be admitted to the telemetry unit for ongoing evaluation and management of acute kidney injury with hyperkalemia, superimposed on chronic kidney disease stage III, possibly prerenal in the setting of vomiting, or possibly secondary to Bactrim.  1. Acute kidney injury superimposed on CKD stage III due to end-stage liver disease, bactrim, recent large volume paracentesis, and Staph infection of the left foot.  Creatinine appears to have stabilized around 2.5-2.7. 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 Ophelia Charter in 1 week.   3. Liver cirrhosis  - Has esophageal varices, grade I by EGD in February 2018  - Continue Protonix, rifaximin, lactulose  Nausea and vomiting, likely related to AKI and infection, resolving -  Continue antiemetics  Hepatic and metabolic encephalopathy, resolving -  Continue increased lactulose  -  Continue rifaximin -  US guided diagnostic paracentesis:  SBP unlikely given low WBC count -  Body fluid culture no growth  final  4. Hyperkalemia, resolved with IVF and kayexelate  5. Hyponatremia, mild and asymptomatic in setting of cirrhosis  6. Normocytic anemia, thrombocytopenia due to cirrhosis -  Platelets trending up slightly -   Hemoglobin stable  7. Hypotension- improved with hydration  DVT prophylaxis:  Heparin  Code Status:  DNR Family Communication:  Patient alone  Disposition Plan:    Approval for CIR.    Consultants:   Nephrology  Wound care  Orthopedic surgery, Dr. 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, with necrosis of bone (HCC)   CKD (chronic kidney disease), stage III   Hyponatremia   Hyperkalemia   Abscess of left foot   Septic arthritis of left foot [facility])  Discharge Instructions  Discharge Instructions    Increase activity slowly    Complete by:  As directed      Allergies as of 12/27/2016   No Known Allergies     Medication List    STOP taking these medications   BISMUTH TRIBROMOPH-PETROLATUM EX   feeding supplement (PRO-STAT SUGAR FREE 64) Liqd   nadolol 20 MG tablet Commonly known as:  CORGARD   SANTYL ointment Generic drug:  collagenase   sulfamethoxazole-trimethoprim 800-160 MG tablet Commonly known as:  BACTRIM DS,SEPTRA DS   UNABLE TO FIND   UNABLE TO FIND   vitamin C 500 MG tablet Commonly known as:  ASCORBIC ACID   wound dressings gel   ZINC-220 PO     TAKE these medications   atorvastatin 40 MG tablet Commonly known as:  LIPITOR Take 40 mg by mouth daily.   brimonidine 0.1 % Soln Commonly known as:  ALPHAGAN P Place 1 drop into both eyes  daily.   feeding supplement (ENSURE ENLIVE) Liqd Take 237 mLs by mouth 2 (two) times daily between meals.   furosemide 20 MG tablet Commonly known as:  LASIX Take 1 tablet (20 mg total) by mouth daily. What changed:  medication strength  how much to take   ipratropium-albuterol 0.5-2.5 (3) MG/3ML Soln Commonly known as:  DUONEB   lactulose 10 GM/15ML solution Commonly known as:  CHRONULAC Take 15 mLs (10 g total) by mouth 3 (three) times daily.   loperamide 2 MG capsule Commonly known as:  IMODIUM Take 1 capsule (2 mg total) by mouth as needed for diarrhea or loose stools.   methocarbamol 500 MG tablet Commonly known as:  ROBAXIN Take 0.5 tablets (250 mg total) by mouth every 8 (eight) hours as needed for muscle spasms.   metoCLOPramide 5 MG tablet Commonly known as:  REGLAN Take 1 tablet (5 mg total) by mouth every 6 (six) hours as needed for nausea.   midodrine 5 MG tablet Commonly known as:  PROAMATINE Take 3 tablets (15 mg total) by mouth 3 (three) times daily with meals.   multivitamin with minerals Tabs tablet Take 1 tablet by mouth daily.   NOVOLOG FLEXPEN 100 UNIT/ML FlexPen Generic drug:  insulin aspart Inject before meals and at bedtime per sliding scale: if BGL is: 200 - 250 = 2 units; 251 - 300 = 4 units; 301 - 350 = 6 units; 351 - 400 = 8 units, if greater than 400 notify MD   oxyCODONE 5 MG immediate release tablet Commonly known as:  ROXICODONE Take one tablet by mouth every 4 hours as needed for pain What changed:  how much to take  how to take this  when to take this  reasons to take this  additional instructions   pantoprazole 40 MG tablet Commonly known as:  PROTONIX Take 1 tablet (40 mg total) by mouth 2 (two) times daily.   rifaximin 550 MG Tabs tablet Commonly known as:  XIFAXAN Take 1 tablet (550 mg total) by mouth 2 (two) times daily.   saccharomyces boulardii 250 MG capsule Commonly known as:  FLORASTOR Take 1 capsule (250  mg total) by mouth 2 (two) times daily.   spironolactone 25 MG tablet Commonly known as:  ALDACTONE Take 1 tablet (25 mg total) by mouth daily. What changed:  medication strength  how much to take   sucralfate 1 GM/10ML suspension Commonly known as:  CARAFATE Take 10 mLs (1 g total) by mouth 4 (four) times daily -  with meals and at bedtime. What changed:  when to take this      Follow-up Information    Eldred Manges, MD Follow up in 1 week(s).   Specialty:  Orthopedic Surgery Contact information: [address] [place] [address] [phone]          No Known Allergies  Procedures/Studies: US Renal  Result Date: 12/19/2016 CLINICAL DATA:  Acute kidney injury EXAM: RENAL / URINARY TRACT ULTRASOUND COMPLETE COMPARISON:  None. FINDINGS: Right Kidney: Length: 11.3 cm. Increased parenchymal echogenicity consistent with medical renal disease. 2 cm cyst at the lateral midpole region. No hydronephrosis. Left Kidney: Length: 12.8 cm. Increased parenchymal echogenicity consistent with medical renal disease. 4.9 cm cyst at the lower pole. 1.4 cm calculus in the midpole collecting system. No hydronephrosis. Bladder: The urinary bladder is empty. Moderate volume peritoneal ascites. IMPRESSION: 1. Increased renal parenchymal echogenicity consistent with medical renal disease. No hydronephrosis. 2. Left midpole calcification may represent a 14 mm collecting system calculus. No obstructing calculi. 3. Peritoneal ascites. Electronically Signed   By: Ellery Plunk M.D.   On: 12/19/2016 03:37   Mr Foot Left Wo Contrast  Result Date: 12/22/2016 CLINICAL DATA:  Diabetic foot ulcer.  History of prior amputation. EXAM: MRI OF THE LEFT FOOT WITHOUT CONTRAST TECHNIQUE: Multiplanar, multisequence MR imaging of the MRI 10/24/2016 was performed. No intravenous contrast was administered. COMPARISON:  MRI 10/24/2016 FINDINGS: Bones/Joint/Cartilage Status post amputation of the first ray back  to the proximal first metatarsal. Signal abnormality in the base of metatarsal is worrisome for osteomyelitis. There is also osteomyelitis involving the second metatarsal and proximal phalanx with marked subluxation and possible septic arthritis. There is also an open wound on the plantar aspect of the forefoot overlying the region of the second metatarsal head. Diffuse cellulitis and myofasciitis. The irregular fluid collection along the medial aspect of the second metatarsal and proximal phalanx suspicious for abscess. The The tibiotalar and subtalar joints are maintained. Small joint effusions. No findings for mid or hindfoot osteomyelitis. IMPRESSION: MR findings consistent with osteomyelitis involving the second metatarsal and second proximal phalanx with probable intervening septic arthritis. There is also cellulitis and myofasciitis and a small abscess along the  medial aspect of the second ray. Status post amputation of the first metatarsal. Signal abnormality in the base of the metatarsal could be postsurgical or due to osteomyelitis. Electronically Signed   By: Rudie Meyer M.D.   On: 12/22/2016 16:33   Dg Chest Portable 1 View  Result Date: 12/18/2016 CLINICAL DATA:  Dyspnea are EXAM: PORTABLE CHEST 1 VIEW COMPARISON:  None. FINDINGS: Shallow lung inflation. The heart size and mediastinal contours are within normal limits. Both lungs are clear. The visualized skeletal structures are unremarkable. IMPRESSION: No active disease. Electronically Signed   By: Deatra Robinson M.D.   On: 12/18/2016 19:48   Dg Abd Portable 1v  Result Date: 12/19/2016 CLINICAL DATA:  Abdominal pain today. EXAM: PORTABLE ABDOMEN - 1 VIEW COMPARISON:  Plain films the abdomen 10/30/2016. CT abdomen and pelvis 10/23/2016. FINDINGS: The bowel gas pattern is normal. No radio-opaque calculi or other significant radiographic abnormality are seen. IMPRESSION: Negative exam. Electronically Signed   By: Drusilla Kanner M.D.   On:  12/19/2016 12:35   Xr Foot Complete Left  Result Date: 12/14/2016 Review x-rays left foot obtained that shows first ray amputation no evidence of osteomyelitis in the proximal portion of the first metatarsal. He has subluxation of the metatarsal phalangeal joint second third fourth. Patient has some soft tissue swelling on x-ray but no evidence of bone infection. Impression status post first ray amputation left foot. No changes suggestive of osteomyelitis currently.  Ir Paracentesis  Result Date: 12/20/2016 INDICATION: Recurrent ascites EXAM: ULTRASOUND-GUIDED PARACENTESIS COMPARISON:  Previous paracentesis MEDICATIONS: 10 cc 1% lidocaine COMPLICATIONS: None immediate. TECHNIQUE: Informed written consent was obtained from the patient after a discussion of the risks, benefits and alternatives to treatment. A timeout was performed prior to the initiation of the procedure. Initial ultrasound scanning demonstrates a large amount of ascites within the left lower abdominal quadrant. The left lower abdomen was prepped and draped in the usual sterile fashion. 1% lidocaine with epinephrine was used for local anesthesia. Under direct ultrasound guidance, a 19 gauge, 7-cm, Yueh catheter was introduced. An ultrasound image was saved for documentation purposed. The paracentesis was performed. The catheter was removed and a dressing was applied. The patient tolerated the procedure well without immediate post procedural complication. FINDINGS: A total of approximately 50 cc only of yellow fluid was removed. Samples were sent to the laboratory as requested by the clinical team. IMPRESSION: Successful ultrasound-guided paracentesis yielding 50 cc only of peritoneal fluid. Diagnostic only per MD. 50 cc only. Read by Robet Leu [facility] Electronically Signed   By: Judie Petit.  Shick M.D.   On: 12/20/2016 15:14    (Echo, Carotid, EGD, Colonoscopy, ERCP)    Subjective: Pt says he feels better and motivated to go to CIR to get  stronger and have more mobility  Discharge Exam: Vitals:   12/27/16 0626 12/27/16 0911  BP: (!) 102/57 119/63  Pulse: 65 66  Resp: 20 18  Temp: 98.3 F (36.8 C) 98 F (36.7 C)   Vitals:   12/26/16 1700 12/26/16 2050 12/27/16 0626 12/27/16 0911  BP: (!) 104/50 (!) 116/48 (!) 102/57 119/63  Pulse: 63 66 65 66  Resp: Temp: 98.4 F (36.9 C) 97.5 F (36.4 C) 98.3 F (36.8 C) 98 F (36.7 C)  TempSrc: Oral Oral Oral Oral  SpO2: 98% 98% 99% 95%  Weight:  104.8 kg (231 lb 0.7 oz)    Height:       General exam:  Adult male,  awake, speech more clear and rapid today HEENT:  NCAT, MMM Respiratory system:  No focal rales, rhonchi, or wheeze anteriorly Cardiovascular system: Regular rate and rhythm, normal S1/S2. No murmurs, rubs, gallops or clicks.  Warm extremities Gastrointestinal system: Normal active bowel sounds, soft, moderate to severe distension, nontender MSK:  Right AKA.  Left BKA, stump wrapped.  Bandages clean and dry.  Neuro:  Grossly moves upper extremities  The results of significant diagnostics from this hospitalization (including imaging, microbiology, ancillary and laboratory) are listed below for reference.     Microbiology: Recent Results (from the past 240 hour(s))  MRSA PCR Screening     Status: Abnormal   Collection Time: 12/19/16  1:24 AM  Result Value Ref Range Status   MRSA by PCR POSITIVE (A) NEGATIVE Final    Comment:        The GeneXpert MRSA Assay (FDA approved for NASAL specimens only), is one component of a comprehensive MRSA colonization surveillance program. It is not intended to diagnose MRSA infection nor to guide or monitor treatment for MRSA infections. RESULT CALLED TO, READ BACK BY AND VERIFIED WITH: C DAVIS,RN  12/19/16 MKELLY,MLT   Gram stain     Status: None   Collection Time: 12/20/16  3:11 PM  Result Value Ref Range Status   Specimen Description FLUID PERITONEAL  Final   Special Requests NONE  Final   Gram  Stain   Final    ABUNDANT WBC PRESENT,BOTH PMN AND MONONUCLEAR NO ORGANISMS SEEN    Report Status 12/20/2016 FINAL  Final  Culture, body fluid-bottle     Status: None   Collection Time: 12/20/16  3:11 PM  Result Value Ref Range Status   Specimen Description FLUID PERITONEAL  Final   Special Requests BOTTLES DRAWN AEROBIC AND ANAEROBIC 10CC  Final   Culture NO GROWTH 6 DAYS  Final   Report Status 12/26/2016 FINAL  Final     Labs: BNP (last 3 results)  Recent Labs  12/18/16 1955  BNP 213.0*   Basic Metabolic Panel:  Recent Labs Lab 12/23/16 0149 12/24/16 0348 12/25/16 0656 12/26/16 0622 12/27/16 0420  NA 136 135 136 135 134*  K 4.5 4.5 4.6 4.6 4.4  CL 104 106 107 107 106  CO2 23 19* 21* 21* 24  GLUCOSE 139* 107* 100* 97 177*  BUN 54* 49* 49* 50* 51*  CREATININE 2.76* 2.51* 2.70* 2.74* 2.84*  CALCIUM 8.6* 8.3* 8.4* 8.3* 8.3*  PHOS 3.2 3.4 3.7 3.7 3.0   Liver Function Tests:  Recent Labs Lab 12/21/16 0527  12/23/16 0149 12/24/16 0348 12/25/16 0656 12/26/16 0622 12/27/16 0420  AST 35  --   --   --   --   --   --   ALT 15*  --   --   --   --   --   --   ALKPHOS 48  --   --   --   --   --   --   BILITOT 1.6*  --   --   --   --   --   --   PROT 6.7  --   --   --   --   --   --   ALBUMIN 1.9*  1.9*  < > 2.5* 2.2* 2.3* 2.2* 2.1*  < > = values in this interval not displayed. No results for input(s): LIPASE, AMYLASE in the last 168 hours. No results for input(s): AMMONIA in the last 168 hours. CBC:  Recent Labs Lab 12/20/16 1605 12/21/16 0527 12/23/16 1034 12/24/16 0348  WBC  --  4.3 5.7 5.1  NEUTROABS 6.4  --   --   --   HGB  --  8.0* 8.8* 8.6*  HCT  --  24.4* 26.9* 26.4*  MCV  --  98.0 100.0 99.6  PLT  --  46* REPEATED TO VERIFY 84*   Cardiac Enzymes: No results for input(s): CKTOTAL, CKMB, CKMBINDEX, TROPONINI in the last 168 hours. BNP: Invalid input(s): POCBNP CBG:  Recent Labs Lab 12/26/16 1155 12/26/16 1656 12/26/16 2048 12/27/16 0800  12/27/16 1201  GLUCAP 162* 212* 239* 151* 163*   D-Dimer No results for input(s): DDIMER in the last 72 hours. Hgb A1c No results for input(s): HGBA1C in the last 72 hours. Lipid Profile No results for input(s): CHOL, HDL, LDLCALC, TRIG, CHOLHDL, LDLDIRECT in the last 72 hours. Thyroid function studies No results for input(s): TSH, T4TOTAL, T3FREE, THYROIDAB in the last 72 hours.  Invalid input(s): FREET3 Anemia work up No results for input(s): VITAMINB12, FOLATE, FERRITIN, TIBC, IRON, RETICCTPCT in the last 72 hours. Urinalysis    Component Value Date/Time   COLORURINE YELLOW 12/18/2016 2200   APPEARANCEUR HAZY (A) 12/18/2016 2200   LABSPEC 1.015 12/18/2016 2200   PHURINE 5.0 12/18/2016 2200   GLUCOSEU NEGATIVE 12/18/2016 2200   HGBUR SMALL (A) 12/18/2016 2200   BILIRUBINUR NEGATIVE 12/18/2016 2200   KETONESUR NEGATIVE 12/18/2016 2200   PROTEINUR 30 (A) 12/18/2016 2200   NITRITE NEGATIVE 12/18/2016 2200   LEUKOCYTESUR NEGATIVE 12/18/2016 2200   Sepsis Labs Invalid input(s): PROCALCITONIN,  WBC,  LACTICIDVEN Microbiology Recent Results (from the past 240 hour(s))  MRSA PCR Screening     Status: Abnormal   Collection Time: 12/19/16  1:24 AM  Result Value Ref Range Status   MRSA by PCR POSITIVE (A) NEGATIVE Final    Comment:        The GeneXpert MRSA Assay (FDA approved for NASAL specimens only), is one component of a comprehensive MRSA colonization surveillance program. It is not intended to diagnose MRSA infection nor to guide or monitor treatment for MRSA infections. RESULT CALLED TO, READ BACK BY AND VERIFIED WITH: C DAVIS,RN  12/19/16 MKELLY,MLT   Gram stain     Status: None   Collection Time: 12/20/16  3:11 PM  Result Value Ref Range Status   Specimen Description FLUID PERITONEAL  Final   Special Requests NONE  Final   Gram Stain   Final    ABUNDANT WBC PRESENT,BOTH PMN AND MONONUCLEAR NO ORGANISMS SEEN    Report Status 12/20/2016 FINAL  Final   Culture, body fluid-bottle     Status: None   Collection Time: 12/20/16  3:11 PM  Result Value Ref Range Status   Specimen Description FLUID PERITONEAL  Final   Special Requests BOTTLES DRAWN AEROBIC AND ANAEROBIC 10CC  Final   Culture NO GROWTH 6 DAYS  Final   Report Status 12/26/2016 FINAL  Final   Time coordinating discharge: 34 minutes  SIGNED:  Standley Dakins, MD  [facility] 12/27/2016, 1:47 PM Pager [phone]  If 7PM-7AM, please contact night-coverage [email or web address] Password [email or web address]

## 2016-12-27 NOTE — Progress Notes (Signed)
Retta Diones, RN Rehab Admission Coordinator Signed Physical Medicine and Rehabilitation  PMR Pre-admission Date of Service: 12/27/2016 2:41 PM  Related encounter: ED to Hosp-Admission (Discharged) from 12/18/2016 in [place] [facility]       [] Hide copied text PMR Admission Coordinator Pre-Admission Assessment  Patient: Jeffery [name] is an 66 y.o., male MRN: [id number] DOB: 1951/07/31 Height: 6' 1"  (185.4 cm) Weight: 104.8 kg (231 lb 0.7 oz)                                                                                                                                                  Insurance Information HMO:     PPO: Yes     PCP:       IPA:       80/20:       OTHER:  Group # [id number] PRIMARY:  BCBS state health plan      Policy#: [id number]      Subscriber:  Chrissie Noa Borchard          CM Name: Levy Pupa      Phone#: [phone]     Fax#: [phone] Pre-Cert#: [id number] from 4/11 to 01/09/17 with update due 01/08/17     Employer: Works FT at [employer] Benefits:  Phone #:  [phone]     Name: Online Eff. 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 He was discharged to SNF for rehab but was readmitted on 12/18/16 with N/V,weakness and acute  renal failure. He was treated with IVF and started on IV antibiotics as reports of recent staph infection. MRI of foot done revealing osteomyelitis with septic arthritis and myofasciitis. Nephrology consulted for input on acute on chronic renal failure and felt that renal failure multifactorial and is following for input. 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 Post op lethargy resolving and therapy ongoing. CIR recommended due to significant deficits in mobility and ability to carry out ADL  tasks.   Lives alone but was independent and working prior to Feb. He is motivated to get therapy to return to to prior level. Has friends who can check in/assist after discharge.   Past Medical History  Past Medical History:  Diagnosis Date  . Arthritis   . Asthma   . Bilateral cataracts   . Blurred vision    improved now  . Cholelithiasis   . Diabetes mellitus    type II  . Eczema    inner wrist  . Hypertension   . Muscle cramps   . Necrotizing fasciitis ([place])   . Neuropathy ([place])   . Rash    wrist and hands since late june  . Retinal degeneration     Family History  family history includes Cancer in his mother.  Prior Rehab/Hospitalizations: Recent SNF admission and then readmitted 12/18/16 to hospital.  Has the patient had major surgery during 100 days prior to admission? No  Current Medications   Current Facility-Administered Medications:  .  0.9 %  sodium chloride infusion, , Intravenous, Once, Marybelle Killings, MD .  acetaminophen (TYLENOL) tablet 650 mg, 650 mg, Oral, Q6H PRN **OR** acetaminophen (TYLENOL) suppository 650 mg, 650 mg, Rectal, Q6H PRN, Marybelle Killings, MD .  atorvastatin (LIPITOR) tablet 40 mg, 40 mg, Oral, Daily, Vianne Bulls, MD, 40 mg at 12/27/16 1014 .  bisacodyl (DULCOLAX) suppository 10 mg, 10 mg, Rectal, Once, Janece Canterbury, MD .  brimonidine (ALPHAGAN) 0.15 % ophthalmic solution 1 drop, 1 drop, Both  Eyes, Daily, Vianne Bulls, MD, 1 drop at 12/27/16 1015 .  feeding supplement (ENSURE ENLIVE) (ENSURE ENLIVE) liquid 237 mL, 237 mL, Oral, BID BM, Ilene Qua Opyd, MD, 237 mL at 12/27/16 1000 .  feeding supplement (NEPRO CARB STEADY) liquid 237 mL, 237 mL, Oral, Q1500, Janece Canterbury, MD, 237 mL at 12/26/16 1832 .  heparin injection 5,000 Units, 5,000 Units, Subcutaneous, Q8H, Janece Canterbury, MD, 5,000 Units at 12/27/16 1329 .  insulin aspart (novoLOG) injection 0-5 Units, 0-5 Units, Subcutaneous, QHS, Janece Canterbury, MD, 2 Units at 12/26/16 2259 .  insulin aspart (novoLOG) injection 0-9 Units, 0-9 Units, Subcutaneous, TID WC, Janece Canterbury, MD, 2 Units at 12/27/16 1329 .  ipratropium-albuterol (DUONEB) 0.5-2.5 (3) MG/3ML nebulizer solution 3 mL, 3 mL, Nebulization, Q4H PRN, Clanford L Johnson, MD .  lactulose (CHRONULAC) 10 GM/15ML solution 10 g, 10 g, Oral, TID, Janece Canterbury, MD, 10 g at 12/27/16 1014 .  loperamide (IMODIUM) capsule 2 mg, 2 mg, Oral, PRN, Ilene Qua Opyd, MD .  metoCLOPramide (REGLAN) tablet 5-10 mg, 5-10 mg, Oral, Q8H PRN **OR** metoCLOPramide (REGLAN) injection 5-10 mg, 5-10 mg, Intravenous, Q8H PRN, Marybelle Killings, MD .  midodrine (PROAMATINE) tablet 15 mg, 15 mg, Oral, TID WC, Janece Canterbury, MD, 15 mg at 12/27/16 1330 .  morphine 2 MG/ML injection 2 mg, 2 mg, Intravenous, Q2H PRN, Marybelle Killings, MD .  multivitamin with minerals tablet 1 tablet, 1 tablet, Oral, Daily, Vianne Bulls, MD, 1 tablet at 12/27/16 1014 .  ondansetron (ZOFRAN) tablet 4 mg, 4 mg, Oral, Q6H PRN **OR** ondansetron (ZOFRAN) injection 4 mg, 4 mg, Intravenous, Q6H PRN, Marybelle Killings, MD .  oxyCODONE (Oxy IR/ROXICODONE) immediate release tablet 5-10 mg, 5-10 mg, Oral, Q3H PRN, Marybelle Killings, MD, 10 mg at 12/24/16 9390 .  pantoprazole (PROTONIX) EC tablet 40 mg, 40 mg, Oral, BID, Ilene Qua Opyd, MD, 40 mg at 12/27/16 1014 .  rifaximin (XIFAXAN) tablet 550 mg,  550 mg, Oral, BID, Janece Canterbury, MD, 550 mg  at 12/27/16 1014 .  saccharomyces boulardii (FLORASTOR) capsule 250 mg, 250 mg, Oral, BID, Ilene Qua Opyd, MD, 250 mg at 12/27/16 1014 .  sodium chloride flush (NS) 0.9 % injection 3 mL, 3 mL, Intravenous, Q12H, Ilene Qua Opyd, MD, 3 mL at 12/27/16 1000 .  sucralfate (CARAFATE) 1 GM/10ML suspension 1 g, 1 g, Oral, TID AC & HS, Ilene Qua Opyd, MD, 1 g at 12/27/16 1329  Patients Current Diet: Diet renal/carb modified with fluid restriction Diet-HS Snack? Nothing; Room service appropriate? Yes; Fluid consistency: Thin  Precautions / Restrictions Precautions Precautions: Fall Restrictions Weight Bearing Restrictions: Yes   Has the patient had 2 or more falls or a fall with injury in the past year?No  Prior Activity Level Community (5-7x/wk): Went out daily, worked [employer] as a Pharmacist, hospital at [employer], was driving.  Home Assistive Devices / Equipment Home Assistive Devices/Equipment: Wheelchair Home Equipment: Shower seat - built in, Wheelchair - manual  Prior Device Use: Indicate devices/aids used by the patient prior to current illness, exacerbation or injury? Manual wheelchair  Prior Functional Level Prior Function Level of Independence: Needs assistance Gait / Transfers Assistance Needed: Pt able to transfer I untill about 2 months ago ADL's / Homemaking Assistance Needed: Pt performing ADLs till 2 months ago during his toe amputation Comments: Pt has been immobile ~ 2 month. Pt was transfering   Self Care: Did the patient need help bathing, dressing, using the toilet or eating?  Independent  Indoor Mobility: Did the patient need assistance with walking from room to room (with or without device)? Independent  Stairs: Did the patient need assistance with internal or external stairs (with or without device)? Dependent  Functional Cognition: Did the patient need help planning regular tasks such as shopping or remembering to take medications? Independent  Current Functional  Level Cognition  Overall Cognitive Status: Within Functional Limits for tasks assessed Orientation Level: Oriented X4 Following Commands: Follows one step commands with increased time, Follows one step commands consistently Safety/Judgement: Decreased awareness of safety, Decreased awareness of deficits    Extremity Assessment (includes Sensation/Coordination)  Upper Extremity Assessment: Generalized weakness  Lower Extremity Assessment: Generalized weakness    ADLs  Overall ADL's : Needs assistance/impaired Eating/Feeding: Set up, Sitting Grooming: Set up, Bed level Upper Body Bathing: Minimal assistance, Bed level Lower Body Bathing: Moderate assistance, Bed level Upper Body Dressing : Minimal assistance, Bed level Lower Body Dressing: Maximal assistance, Bed level Functional mobility during ADLs: Maximal assistance, +2 for physical assistance General ADL Comments: Pt will require strengthing of BUE to increase independence in ADLs and functional transfers    Mobility  Overal bed mobility: Needs Assistance Bed Mobility: Supine to Sit Rolling: Max assist, +2 for physical assistance Supine to sit: Mod assist, +2 for physical assistance General bed mobility comments: cues for technique and truncal stability    Transfers  Overall transfer level: Needs assistance Transfers: Government social research officer transfers: Max assist, +2 physical assistance General transfer comment: pt able to stay upright in sitting with UE support while assisted to scoot backward.    Ambulation / Gait / Stairs / Wheelchair Mobility  Ambulation/Gait General Gait Details: Not Applicable    Posture / Balance Dynamic Sitting Balance Sitting balance - Comments: more easily held long sitting, but due to hamstring tightness, unable to fully extend L knee. Balance Overall balance assessment: Needs assistance Sitting-balance support: Single extremity supported, No upper extremity  supported Sitting balance-Leahy Scale:  Fair Sitting balance - Comments: more easily held long sitting, but due to hamstring tightness, unable to fully extend L knee.    Special needs/care consideration BiPAP/CPAP No CPM No Continuous Drip IV No Dialysis No     Life Vest No Oxygen No Special Bed No Trach Size No Wound Vac (area) No      Skin Stump dressing to L BKA site                             Bowel mgmt: Last BM 12/26/16 Bladder mgmt: Voiding in urinal Diabetic mgmt Yes, not on medication at home, but on insulin here in hospital Contact Isolation:  Yes, for MRSA   Previous Home Environment Living Arrangements: Alone Available Help at Discharge: Friend(s) Type of Home: House Home Layout: One level Home Access: Ramped entrance Bathroom Shower/Tub: Walk-in shower, Door [employer] Toilet: Handicapped height Bathroom Accessibility: Yes How Accessible: Accessible via wheelchair [place]: Other (Comment) (at SNF) Additional Comments: works as Event organiser at [employer] for Discharge Living Setting: Patient's home, Alone, House (Lives alone.) Type of Home at Discharge: House Discharge [place]: Two level, Able to live on main level with bedroom/bathroom Alternate Level Stairs-Number of Steps: Flight Discharge Home Access: Ramped entrance Does the patient have any problems obtaining your medications?: No  Social/Family/Support Systems Patient Roles: Other (Comment) (Has a sister and friends.) Contact Information: Raliegh Ip - sister - [phone] Anticipated Caregiver: self with help from student, Marikay Alar Ability/Limitations of Caregiver: Has local help from a student.  sister lives in [place] and has a friend in [place]. Caregiver Availability: Intermittent Discharge Plan Discussed with Primary Caregiver: Yes (I spoke only with the patient.) Is Caregiver In Agreement with Plan?: Yes Does Caregiver/Family have Issues  with Lodging/Transportation while Pt is in Rehab?: No  Goals/Additional Needs Patient/Family Goal for Rehab: PT/OT mod I goals Expected length of stay: 7-10 days Cultural Considerations: None Dietary Needs: Renal, carb mod, fluid restriction, thin liquids Equipment Needs: TBD Pt/Family Agrees to Admission and willing to participate: Yes (Spoke to patient.) Program Orientation Provided & Reviewed with Pt/Caregiver Including Roles  & Responsibilities: Yes  Decrease burden of Care through IP rehab admission: N/A  Possible need for SNF placement upon discharge: Not planned  Patient Condition: This patient's condition remains as documented in the consult dated 12/26/16, in which the Rehabilitation Physician determined and documented that the patient's condition is appropriate for intensive rehabilitative care in an inpatient rehabilitation facility. Will admit to inpatient rehab today.  Preadmission Screen Completed By:  Retta Diones, 12/27/2016 3:03 PM ______________________________________________________________________   Discussed status with Dr. Posey Pronto on 12/27/16 at  1502 and received telephone approval for admission today.  Admission Coordinator:  Retta Diones, time 1502/Date 12/27/16       Cosigned by: Ankit Lorie Phenix, MD at 12/27/2016 3:08 PM  Revision History

## 2016-12-27 NOTE — H&P (Signed)
Physical Medicine and Rehabilitation Admission H&P    Chief Complaint  Patient presents with  . L-BKA    HPI: Jeffery [name] is a 66 y.o. 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 History taken from chart review and patient. He was discharged to SNF for rehab but was readmitted on 12/18/16 with N/V, weakness and acute renal failure. He was treated with IVF and started on IV antibiotics as reports of recent staph infection. MRI of foot reviewed, inflammation of tissue and bone. Per report, osteomyelitis with septic arthritis and myofasciitis.  He underwent paracentesis of 50 cc yellow fluid on 4/4 for recurrent ascites.  Nephrology consulted for input on acute on chronic renal failure and felt that renal failure multifactorial and is following for input.  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 nephrology Post op lethargy resolving and therapy ongoing. CIR recommended due to significant deficits in mobility and ability to carry out ADL tasks   Review of Systems  HENT: Negative for hearing loss and tinnitus.   Eyes: Negative for blurred vision and double vision.  Respiratory: Negative for cough and hemoptysis.   Cardiovascular: Negative for chest pain and palpitations.  Gastrointestinal: Positive for constipation. Negative for heartburn and nausea.  Genitourinary: Positive for frequency. Negative for dysuria and urgency.  Musculoskeletal: Positive for joint pain. Negative for myalgias and neck pain.  Neurological: Positive for sensory change (numbness to left knee), focal weakness and weakness.  Psychiatric/Behavioral: Negative for depression. The patient is not nervous/anxious and does not have insomnia.   All other systems reviewed and are negative.     Past Medical  History:  Diagnosis Date  . Arthritis   . Asthma   . Bilateral cataracts   . Blurred vision    improved now  . Cholelithiasis   . Diabetes mellitus    type II  . Eczema    inner wrist  . Hypertension   . Muscle cramps   . Necrotizing fasciitis ([place])   . Neuropathy ([place])   . Rash    wrist and hands since late june  . Retinal degeneration     Past Surgical History:  Procedure Laterality Date  . ABOVE KNEE LEG AMPUTATION     right  . AMPUTATION Left 12/23/2016   Procedure: AMPUTATION BELOW KNEE;  Surgeon: Marybelle Killings, MD;  Location: [facility];  Service: Orthopedics;  Laterality: Left;  . APPLICATION OF WOUND VAC Left 11/06/2016   Procedure: APPLICATION OF WOUND VAC;  Surgeon: Marybelle Killings, MD;  Location: [facility];  Service: Orthopedics;  Laterality: Left;  . COLONOSCOPY WITH PROPOFOL N/A 05/18/2016   Procedure: COLONOSCOPY WITH PROPOFOL;  Surgeon: Milus Banister, MD;  Location: [facility];  Service: Endoscopy;  Laterality: N/A;  . ESOPHAGOGASTRODUODENOSCOPY (EGD) WITH PROPOFOL N/A 05/18/2016   Procedure: ESOPHAGOGASTRODUODENOSCOPY (EGD) WITH PROPOFOL;  Surgeon: Milus Banister, MD;  Location: [facility];  Service: Endoscopy;  Laterality: N/A;  . ESOPHAGOGASTRODUODENOSCOPY (EGD) WITH PROPOFOL N/A 11/01/2016   Procedure: ESOPHAGOGASTRODUODENOSCOPY (EGD) WITH PROPOFOL;  Surgeon: Mauri Pole, MD;  Location: [facility];  Service: Endoscopy;  Laterality: N/A;  . EYE SURGERY  09/2011   bilateral for retinopathy - laser eye surgery  . I&D EXTREMITY Left 10/25/2016   Procedure: LEFT FOOT DEBRIDEMENT RAY AMPUTATION PLACEMENT OF WOUND VAC;  Surgeon:  Marybelle Killings, MD;  Location: [facility];  Service: Orthopedics;  Laterality: Left;  . I&D EXTREMITY Left 11/06/2016   Procedure: IRRIGATION AND DEBRIDEMENT EXTREMITY/LEFT FOOT;  Surgeon: Marybelle Killings, MD;  Location: [facility];  Service: Orthopedics;  Laterality: Left;  . IR PARACENTESIS  12/20/2016  . left metatarsal  2003   5th  . TOE AMPUTATION      Left big toe    Family History  Problem Relation Age of Onset  . Cancer Mother     unknown type    Social History:  Lives alone. Independent at wheelchair level. Professor at [employer]. He  reports that he has never smoked. He has never used smokeless tobacco. He reports that he does not drink alcohol or use drugs.    Allergies: No Known Allergies    Medications Prior to Admission  Medication Sig Dispense Refill  . atorvastatin (LIPITOR) 40 MG tablet Take 40 mg by mouth daily.  2  . brimonidine (ALPHAGAN P) 0.1 % SOLN Place 1 drop into both eyes daily.    . insulin aspart (NOVOLOG FLEXPEN) 100 UNIT/ML FlexPen Inject before meals and at bedtime per sliding scale: if BGL is: 200 - 250 = 2 units; 251 - 300 = 4 units; 301 - 350 = 6 units; 351 - 400 = 8 units, if greater than 400 notify MD    . loperamide (IMODIUM) 2 MG capsule Take 1 capsule (2 mg total) by mouth as needed for diarrhea or loose stools. 30 capsule 0  . methocarbamol (ROBAXIN) 500 MG tablet Take 0.5 tablets (250 mg total) by mouth every 8 (eight) hours as needed for muscle spasms. 15 tablet 0  . metoCLOPramide (REGLAN) 5 MG tablet Take 1 tablet (5 mg total) by mouth every 6 (six) hours as needed for nausea. 20 tablet 0  . Multiple Vitamin (MULTIVITAMIN WITH MINERALS) TABS tablet Take 1 tablet by mouth daily.    . nadolol (CORGARD) 20 MG tablet Take 1 tablet (20 mg total) by mouth daily. 30 tablet 0  . oxyCODONE (ROXICODONE) 5 MG immediate release tablet Take one tablet by mouth every 4 hours as needed for pain (Patient taking differently: Take 5 mg by mouth every 4 (four) hours as needed for severe pain. ) 180 tablet 0  . pantoprazole (PROTONIX) 40 MG tablet Take 1 tablet (40 mg total) by mouth 2 (two) times daily. 60 tablet 0  . saccharomyces boulardii (FLORASTOR) 250 MG capsule Take 1 capsule (250 mg total) by mouth 2 (two) times daily. 10 capsule 0  . sucralfate (CARAFATE) 1 GM/10ML suspension Take 10 mLs (1 g total) by mouth 4  (four) times daily -  with meals and at bedtime. (Patient taking differently: Take 1 g by mouth 4 (four) times daily -  before meals and at bedtime. ) 420 mL 0  . [EXPIRED] sulfamethoxazole-trimethoprim (BACTRIM DS,SEPTRA DS) 800-160 MG tablet Take 1 tablet by mouth 2 (two) times daily. FOR 10 DAYS (Start date: 12/12/16)    . UNABLE TO FIND MedPass: Drink 237 ml's by mouth three times a day    . UNABLE TO FIND Protein Liquid: Drink 30 ml's by mouth three times a day for wound healing    . [EXPIRED] vitamin C (ASCORBIC ACID) 500 MG tablet Take 500 mg by mouth 2 (two) times daily. FOR 45 DAYS (Start date: 11/09/16)    . [EXPIRED] Zinc Sulfate (ZINC-220 PO) Take 220 mg by mouth daily.     . [DISCONTINUED]  furosemide (LASIX) 40 MG tablet Take 40 mg by mouth daily.    . [DISCONTINUED] spironolactone (ALDACTONE) 100 MG tablet Take 100 mg by mouth daily.     . Amino Acids-Protein Hydrolys (FEEDING SUPPLEMENT, PRO-STAT SUGAR FREE 64,) LIQD Take 30 mLs by mouth 3 (three) times daily. (Patient not taking: Reported on 12/18/2016) 900 mL 0  . BISMUTH TRIBROMOPH-PETROLATUM EX Apply to right foot topically every Tuesday, Thursday and Saturday    . collagenase (SANTYL) ointment Apply to right heel topically on Tuesday, Thursday and Saturday    . ipratropium-albuterol (DUONEB) 0.5-2.5 (3) MG/3ML SOLN     . wound dressings gel Apply topically as needed for wound care. TheraHoney Gel - Apply to left heel and left plantar foot topically every day      Home: Home Living Family/patient expects to be discharged to:: Skilled nursing facility Living Arrangements: Alone Available Help at Discharge: Friend(s) Type of Home: House Home Access: Ramped entrance Home Layout: One level Bathroom Shower/Tub: Gaffer, Door [employer] Toilet: Handicapped height Bathroom Accessibility: Yes Home Equipment: Civil engineer, contracting - built in, Wheelchair - manual Additional Comments: works as Event organiser at [employer]   Functional  History: Prior Function Level of Independence: Needs Licensed conveyancer / Transfers Assistance Needed: Pt able to transfer I untill about 2 months ago ADL's / Homemaking Assistance Needed: Pt performing ADLs till 2 months ago during his toe amputation Comments: Pt has been immobile ~ 2 month. Pt was transfering   Functional Status:  Mobility: Bed Mobility Overal bed mobility: Needs Assistance Bed Mobility: Supine to Sit Rolling: Max assist, +2 for physical assistance Supine to sit: Mod assist, +2 for physical assistance General bed mobility comments: cues for technique and truncal stability Transfers Overall transfer level: Needs assistance Transfers: Government social research officer transfers: Max assist, +2 physical assistance General transfer comment: pt able to stay upright in sitting with UE support while assisted to scoot backward. Ambulation/Gait General Gait Details: Not Applicable    ADL: ADL Overall ADL's : Needs assistance/impaired Eating/Feeding: Set up, Sitting Grooming: Set up, Bed level Upper Body Bathing: Minimal assistance, Bed level Lower Body Bathing: Moderate assistance, Bed level Upper Body Dressing : Minimal assistance, Bed level Lower Body Dressing: Maximal assistance, Bed level Functional mobility during ADLs: Maximal assistance, +2 for physical assistance General ADL Comments: Pt will require strengthing of BUE to increase independence in ADLs and functional transfers  Cognition: Cognition Overall Cognitive Status: Within Functional Limits for tasks assessed Orientation Level: Oriented X4 Cognition Arousal/Alertness: Awake/alert Behavior During Therapy: WFL for tasks assessed/performed Overall Cognitive Status: Within Functional Limits for tasks assessed Area of Impairment: Memory, Following commands, Safety/judgement, Problem solving Memory: Decreased short-term memory Following Commands: Follows one step commands with increased time,  Follows one step commands consistently Safety/Judgement: Decreased awareness of safety, Decreased awareness of deficits Problem Solving: Slow processing, Decreased initiation, Difficulty sequencing, Requires verbal cues, Requires tactile cues    Blood pressure 119/63, pulse 66, temperature 98 F (36.7 C), temperature source Oral, resp. rate 18, height 6' 1"  (1.854 m), weight 104.8 kg (231 lb 0.7 oz), SpO2 95 %. Physical Exam  Vitals reviewed. Constitutional: He is oriented to person, place, and time. He appears well-developed and well-nourished.  HENT:  Head: Normocephalic and atraumatic.  Mouth/Throat: Oropharynx is clear and moist.  Eyes: Conjunctivae and EOM are normal. Pupils are equal, round, and reactive to light.  Neck: Normal range of motion. Neck supple.  Cardiovascular: Normal rate and regular rhythm.   Respiratory: Effort normal  and breath sounds normal. No stridor. No respiratory distress. He has no wheezes.  GI: Soft. Bowel sounds are normal. He exhibits distension.  + ascites. Question hernia RLQ  Musculoskeletal: He exhibits edema and tenderness.  Moderate edema L-BKA with bloody drainage on dressing. Petechial hemorrhage medially along incision line. Old R-AKA incision well healed  Neurological: He is alert and oriented to person, place, and time.  Motor: B/l UE, RLE HF, LLE HF, KE 5/5  Skin: Skin is warm and dry.  Psychiatric:  Slightly agitated vs. Baseline behavior    Results for orders placed or performed during the hospital encounter of 12/18/16 (from the past 48 hour(s))  Glucose, capillary     Status: None   Collection Time: 12/25/16 10:15 PM  Result Value Ref Range   Glucose-Capillary 98 65 - 99 mg/dL  Renal function panel     Status: Abnormal   Collection Time: 12/26/16  6:22 AM  Result Value Ref Range   Sodium 135 135 - 145 mmol/L   Potassium 4.6 3.5 - 5.1 mmol/L   Chloride 107 101 - 111 mmol/L   CO2 21 (L) 22 - 32 mmol/L   Glucose, Bld 97 65 - 99  mg/dL   BUN 50 (H) 6 - 20 mg/dL   Creatinine, Ser 2.74 (H) 0.61 - 1.24 mg/dL   Calcium 8.3 (L) 8.9 - 10.3 mg/dL   Phosphorus 3.7 2.5 - 4.6 mg/dL   Albumin 2.2 (L) 3.5 - 5.0 g/dL   GFR calc non Af Amer 23 (L) >60 mL/min   GFR calc Af Amer 26 (L) >60 mL/min    Comment: (NOTE) The eGFR has been calculated using the CKD EPI equation. This calculation has not been validated in all clinical situations. eGFR's persistently <60 mL/min signify possible Chronic Kidney Disease.    Anion gap 7 5 - 15  Glucose, capillary     Status: None   Collection Time: 12/26/16  7:51 AM  Result Value Ref Range   Glucose-Capillary 93 65 - 99 mg/dL  Glucose, capillary     Status: Abnormal   Collection Time: 12/26/16 11:55 AM  Result Value Ref Range   Glucose-Capillary 162 (H) 65 - 99 mg/dL  Glucose, capillary     Status: Abnormal   Collection Time: 12/26/16  4:56 PM  Result Value Ref Range   Glucose-Capillary 212 (H) 65 - 99 mg/dL  Glucose, capillary     Status: Abnormal   Collection Time: 12/26/16  8:48 PM  Result Value Ref Range   Glucose-Capillary 239 (H) 65 - 99 mg/dL  Renal function panel     Status: Abnormal   Collection Time: 12/27/16  4:20 AM  Result Value Ref Range   Sodium 134 (L) 135 - 145 mmol/L   Potassium 4.4 3.5 - 5.1 mmol/L   Chloride 106 101 - 111 mmol/L   CO2 24 22 - 32 mmol/L   Glucose, Bld 177 (H) 65 - 99 mg/dL   BUN 51 (H) 6 - 20 mg/dL   Creatinine, Ser 2.84 (H) 0.61 - 1.24 mg/dL   Calcium 8.3 (L) 8.9 - 10.3 mg/dL   Phosphorus 3.0 2.5 - 4.6 mg/dL   Albumin 2.1 (L) 3.5 - 5.0 g/dL   GFR calc non Af Amer 22 (L) >60 mL/min   GFR calc Af Amer 25 (L) >60 mL/min    Comment: (NOTE) The eGFR has been calculated using the CKD EPI equation. This calculation has not been validated in all clinical situations. eGFR's persistently <60 mL/min  signify possible Chronic Kidney Disease.    Anion gap 4 (L) 5 - 15  Glucose, capillary     Status: Abnormal   Collection Time: 12/27/16  8:00 AM    Result Value Ref Range   Glucose-Capillary 151 (H) 65 - 99 mg/dL  Glucose, capillary     Status: Abnormal   Collection Time: 12/27/16 12:01 PM  Result Value Ref Range   Glucose-Capillary 163 (H) 65 - 99 mg/dL   No results found.     Medical Problem List and Plan: 1.  Gait abnormality and limitation in self-care secondary to left BKA with history of right AKA. 2.  DVT Prophylaxis/Anticoagulation: Pharmaceutical: Heparin 3. Pain Management: Controlled on tylenol prn 4. Mood: LCSW to follow for evaluation and support 5. Neuropsych: This patient is capable of making decisions on his own behalf. 6. Skin/Wound Care: Routine pressure relief measures. 7. Fluids/Electrolytes/Nutrition: Strict I/O. 1200 cc FR daily 8. Acute on chronic renal insufficiency: Continue to monitor renal status. Strict I/O.  9. Hypotension: Monitor BP bid--proamatine tid to help with support.  10. Cirrhosis of liver with ascites: On Xifaxan bid and lactulose qid 11. T2DM with neuropathy: Hgb A1c- 5.8-- Has been off meds for years. Monitor BS ac/hs.  12. Hyponatremia: Follow BMP  Post Admission Physician Evaluation: 1. Preadmission assessment reviewed and changes made below. 2. Functional deficits secondary  to left BKA with history of right AKA. 3. Patient is admitted to receive collaborative, interdisciplinary care between the physiatrist, rehab nursing staff, and therapy team. 4. Patient's level of medical complexity and substantial therapy needs in context of that medical necessity cannot be provided at a lesser intensity of care such as a SNF. 5. Patient has experienced substantial functional loss from his/her baseline which was documented above under the "Functional History" and "Functional Status" headings.  Judging by the patient's diagnosis, physical exam, and functional history, the patient has potential for functional progress which will result in measurable gains while on inpatient rehab.  These gains will  be of substantial and practical use upon discharge  in facilitating mobility and self-care at the household level. 6. Physiatrist will provide 24 hour management of medical needs as well as oversight of the therapy plan/treatment and provide guidance as appropriate regarding the interaction of the two. 7. The Preadmission Screening has been reviewed and patient status is unchanged unless otherwise stated above. 8. 24 hour rehab nursing will assist with bladder management, safety, skin/wound care, disease management, pain management and patient education  and help integrate therapy concepts, techniques,education, etc. 9. PT will assess and treat for/with: Lower extremity strength, range of motion, stamina, balance, functional mobility, safety, adaptive techniques and equipment, woundcare, coping skills, pain control, education.   Goals are: Mod I at wheelchair level. 10. OT will assess and treat for/with: ADL's, functional mobility, safety, upper extremity strength, adaptive techniques and equipment, wound mgt, ego support, and community reintegration.   Goals are: Mod I at wheelchair level. Therapy may not proceed with showering this patient. 11. Case Management and Social Worker will assess and treat for psychological issues and discharge planning. 12. Team conference will be held weekly to assess progress toward goals and to determine barriers to discharge. 13. Patient will receive at least 3 hours of therapy per day at least 5 days per week. 14. ELOS: 12-17 days.       15. Prognosis:  good  Delice Lesch, MD, [address] [place] 12/27/2016

## 2016-12-27 NOTE — Progress Notes (Signed)
Physical Therapy Treatment Patient Details Name: Jeffery [name] MRN: [id number] DOB: [birth year] Today's Date: 12/27/2016    History of Present Illness Pt is a 66 y/o male admitted from SNF secondary to nausea, vomitting and generalized weakness. Pt found to have an AKI superimposed on CKD stage III. 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    PT Comments    Has made a good turn around, has been diligent working on exercises.  Very motivated to work at the intensity of CIR to get to "level able to get back to work:"   Follow Up Recommendations  CIR;Supervision/Assistance - 24 hour     Equipment Recommendations  None recommended by PT    Recommendations for Other Services       Precautions / Restrictions Precautions Precautions: Fall    Mobility  Bed Mobility Overal bed mobility: Needs Assistance Bed Mobility: Supine to Sit     Supine to sit: Mod assist;+2 for physical assistance     General bed mobility comments: cues for technique and truncal stability  Transfers Overall transfer level: Needs assistance   Transfers: Licensed conveyancer transfers: Max assist;+2 physical assistance   General transfer comment: pt able to stay upright in sitting with UE support while assisted to scoot backward.  Ambulation/Gait                 Stairs            Wheelchair Mobility    Modified Rankin (Stroke Patients Only)       Balance Overall balance assessment: Needs assistance Sitting-balance support: Single extremity supported;No upper extremity supported Sitting balance-Leahy Scale: Fair Sitting balance - Comments: more easily held long sitting, but due to hamstring tightness, unable to fully extend L knee.                                    Cognition Arousal/Alertness:  Awake/alert Behavior During Therapy: WFL for tasks assessed/performed Overall Cognitive Status: Within Functional Limits for tasks assessed                                        Exercises Amputee Exercises Quad Sets: Left;10 reps;Supine Hip Extension: AROM;Strengthening;Left;10 reps;Supine Hip Flexion/Marching: AROM;Strengthening;Left;10 reps;Supine Knee Flexion: AROM;Left;10 reps;Supine Knee Extension: AROM;Strengthening;Left;10 reps;Supine Other Exercises Other Exercises: Discussed the other exerciises like briging, arm chair push up (like utilized to scoot and position, positioning of the L LE and need to stretch hamstrings    General Comments        Pertinent Vitals/Pain Pain Assessment: 0-10 Pain Score: 4  Pain Location: L Leg Pain Descriptors / Indicators: Sore Pain Intervention(s): Monitored during session    Home Living                      Prior Function            PT Goals (current goals can now be found in the care plan section) Acute Rehab PT Goals Patient Stated Goal: to get back to work PT Goal Formulation: With patient Time For Goal Achievement: 01/03/17 Potential to Achieve Goals: Fair Progress towards PT goals: Progressing toward goals    Frequency  Min 3X/week      PT Plan Current plan remains appropriate    Co-evaluation             End of Session   Activity Tolerance: Patient tolerated treatment well Patient left: with call bell/phone within reach;Other (comment) (left in his own w/c with "amputee board" under L LE) Nurse Communication: Mobility status PT Visit Diagnosis: Pain;Muscle weakness (generalized) (M62.81) Pain - Right/Left: Left Pain - part of body: Leg     Time: 1610-9604 PT Time Calculation (min) (ACUTE ONLY): 39 min  Charges:  $Therapeutic Exercise: 8-22 mins $Therapeutic Activity: 23-37 mins                    G Codes:       01-22-2017  [place] [facility],  PT [phone]  (pager)   Jeffery [name] 2017/01/22, 11:59 AM

## 2016-12-27 NOTE — PMR Pre-admission (Signed)
PMR Admission Coordinator Pre-Admission Assessment  Patient: Jeffery [name] is an 66 y.o., male MRN: [id number] DOB: 10-22-1950 Height: 6' 1"  (185.4 cm) Weight: 104.8 kg (231 lb 0.7 oz)              Insurance Information HMO:     PPO: Yes     PCP:       IPA:       80/20:       OTHER:  Group # [id number] PRIMARY:  BCBS state health plan      Policy#: [id number]      Subscriber:  Chrissie Noa Schlagel  CM Name: Levy Pupa      Phone#: [phone]     Fax#: [phone] Pre-Cert#: [id number] from 4/11 to 01/09/17 with update due 01/08/17     Employer: Works FT at [employer] Benefits:  Phone #:  [phone]     Name: Online Eff. 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 He was discharged to SNF for rehab  but was readmitted on 12/18/16 with N/V, weakness and acute renal failure. He was treated with IVF and started on IV antibiotics as reports of recent staph infection. MRI of foot done revealing osteomyelitis with septic arthritis and myofasciitis.  Nephrology consulted for input on acute on chronic renal failure and felt that renal failure multifactorial and is following for input. 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 Post op lethargy resolving and therapy ongoing. CIR  recommended due to significant deficits in mobility and ability to carry out ADL tasks.   Lives alone but was independent and working prior to Feb. He is motivated to get therapy to return to to prior level. Has friends who can check in/assist after discharge.   Past Medical History  Past Medical History:  Diagnosis Date  . Arthritis   . Asthma   . Bilateral cataracts   . Blurred vision    improved now  . Cholelithiasis   . Diabetes mellitus    type II  . Eczema    inner wrist  . Hypertension   . Muscle cramps   . Necrotizing fasciitis ([place])   . Neuropathy ([place])   . Rash    wrist and hands since late june  . Retinal degeneration     Family History  family history includes Cancer in his mother.  Prior Rehab/Hospitalizations: Recent SNF admission and then readmitted 12/18/16 to hospital.  Has the patient had major surgery during 100 days prior to admission? No  Current Medications   Current Facility-Administered Medications:  .  0.9 %  sodium chloride infusion, , Intravenous, Once, Mark C  Lorin Mercy, MD .  acetaminophen (TYLENOL) tablet 650 mg, 650 mg, Oral, Q6H PRN **OR** acetaminophen (TYLENOL) suppository 650 mg, 650 mg, Rectal, Q6H PRN, Marybelle Killings, MD .  atorvastatin (LIPITOR) tablet 40 mg, 40 mg, Oral, Daily, Ilene Qua Opyd, MD, 40 mg at 12/27/16 1014 .  bisacodyl (DULCOLAX) suppository 10 mg, 10 mg, Rectal, Once, Janece Canterbury, MD .  brimonidine (ALPHAGAN) 0.15 % ophthalmic solution 1 drop, 1 drop, Both Eyes, Daily, Vianne Bulls, MD, 1 drop at 12/27/16 1015 .  feeding supplement (ENSURE ENLIVE) (ENSURE ENLIVE) liquid 237 mL, 237 mL, Oral, BID BM, Ilene Qua Opyd, MD, 237 mL at 12/27/16 1000 .  feeding supplement (NEPRO CARB STEADY) liquid 237 mL, 237 mL, Oral, Q1500, Janece Canterbury, MD, 237 mL at 12/26/16 1832 .  heparin injection 5,000 Units, 5,000 Units, Subcutaneous, Q8H, Janece Canterbury, MD, 5,000 Units at 12/27/16 1329 .  insulin aspart (novoLOG) injection 0-5  Units, 0-5 Units, Subcutaneous, QHS, Janece Canterbury, MD, 2 Units at 12/26/16 2259 .  insulin aspart (novoLOG) injection 0-9 Units, 0-9 Units, Subcutaneous, TID WC, Janece Canterbury, MD, 2 Units at 12/27/16 1329 .  ipratropium-albuterol (DUONEB) 0.5-2.5 (3) MG/3ML nebulizer solution 3 mL, 3 mL, Nebulization, Q4H PRN, Clanford L Johnson, MD .  lactulose (CHRONULAC) 10 GM/15ML solution 10 g, 10 g, Oral, TID, Janece Canterbury, MD, 10 g at 12/27/16 1014 .  loperamide (IMODIUM) capsule 2 mg, 2 mg, Oral, PRN, Ilene Qua Opyd, MD .  metoCLOPramide (REGLAN) tablet 5-10 mg, 5-10 mg, Oral, Q8H PRN **OR** metoCLOPramide (REGLAN) injection 5-10 mg, 5-10 mg, Intravenous, Q8H PRN, Marybelle Killings, MD .  midodrine (PROAMATINE) tablet 15 mg, 15 mg, Oral, TID WC, Janece Canterbury, MD, 15 mg at 12/27/16 1330 .  morphine 2 MG/ML injection 2 mg, 2 mg, Intravenous, Q2H PRN, Marybelle Killings, MD .  multivitamin with minerals tablet 1 tablet, 1 tablet, Oral, Daily, Vianne Bulls, MD, 1 tablet at 12/27/16 1014 .  ondansetron (ZOFRAN) tablet 4 mg, 4 mg, Oral, Q6H PRN **OR** ondansetron (ZOFRAN) injection 4 mg, 4 mg, Intravenous, Q6H PRN, Marybelle Killings, MD .  oxyCODONE (Oxy IR/ROXICODONE) immediate release tablet 5-10 mg, 5-10 mg, Oral, Q3H PRN, Marybelle Killings, MD, 10 mg at 12/24/16 4332 .  pantoprazole (PROTONIX) EC tablet 40 mg, 40 mg, Oral, BID, Ilene Qua Opyd, MD, 40 mg at 12/27/16 1014 .  rifaximin (XIFAXAN) tablet 550 mg, 550 mg, Oral, BID, Janece Canterbury, MD, 550 mg at 12/27/16 1014 .  saccharomyces boulardii (FLORASTOR) capsule 250 mg, 250 mg, Oral, BID, Ilene Qua Opyd, MD, 250 mg at 12/27/16 1014 .  sodium chloride flush (NS) 0.9 % injection 3 mL, 3 mL, Intravenous, Q12H, Ilene Qua Opyd, MD, 3 mL at 12/27/16 1000 .  sucralfate (CARAFATE) 1 GM/10ML suspension 1 g, 1 g, Oral, TID AC & HS, Ilene Qua Opyd, MD, 1 g at 12/27/16 1329  Patients Current Diet: Diet renal/carb modified with fluid restriction Diet-HS Snack? Nothing; Room  service appropriate? Yes; Fluid consistency: Thin  Precautions / Restrictions Precautions Precautions: Fall Restrictions Weight Bearing Restrictions: Yes   Has the patient had 2 or more falls or a fall with injury in the past year?No  Prior Activity Level Community (5-7x/wk): Went out daily, worked [employer] as a Pharmacist, hospital at [employer], was driving.  Home Assistive Devices / Equipment Home Assistive Devices/Equipment: Wheelchair Home Equipment: Shower seat - built in, Wheelchair - manual  Prior Device Use: Indicate devices/aids used by the patient prior to current illness,  exacerbation or injury? Manual wheelchair  Prior Functional Level Prior Function Level of Independence: Needs assistance Gait / Transfers Assistance Needed: Pt able to transfer I untill about 2 months ago ADL's / Homemaking Assistance Needed: Pt performing ADLs till 2 months ago during his toe amputation Comments: Pt has been immobile ~ 2 month. Pt was transfering   Self Care: Did the patient need help bathing, dressing, using the toilet or eating?  Independent  Indoor Mobility: Did the patient need assistance with walking from room to room (with or without device)? Independent  Stairs: Did the patient need assistance with internal or external stairs (with or without device)? Dependent  Functional Cognition: Did the patient need help planning regular tasks such as shopping or remembering to take medications? Independent  Current Functional Level Cognition  Overall Cognitive Status: Within Functional Limits for tasks assessed Orientation Level: Oriented X4 Following Commands: Follows one step commands with increased time, Follows one step commands consistently Safety/Judgement: Decreased awareness of safety, Decreased awareness of deficits    Extremity Assessment (includes Sensation/Coordination)  Upper Extremity Assessment: Generalized weakness  Lower Extremity Assessment: Generalized weakness    ADLs  Overall  ADL's : Needs assistance/impaired Eating/Feeding: Set up, Sitting Grooming: Set up, Bed level Upper Body Bathing: Minimal assistance, Bed level Lower Body Bathing: Moderate assistance, Bed level Upper Body Dressing : Minimal assistance, Bed level Lower Body Dressing: Maximal assistance, Bed level Functional mobility during ADLs: Maximal assistance, +2 for physical assistance General ADL Comments: Pt will require strengthing of BUE to increase independence in ADLs and functional transfers    Mobility  Overal bed mobility: Needs Assistance Bed Mobility: Supine to Sit Rolling: Max assist, +2 for physical assistance Supine to sit: Mod assist, +2 for physical assistance General bed mobility comments: cues for technique and truncal stability    Transfers  Overall transfer level: Needs assistance Transfers: Government social research officer transfers: Max assist, +2 physical assistance General transfer comment: pt able to stay upright in sitting with UE support while assisted to scoot backward.    Ambulation / Gait / Stairs / Wheelchair Mobility  Ambulation/Gait General Gait Details: Not Applicable    Posture / Balance Dynamic Sitting Balance Sitting balance - Comments: more easily held long sitting, but due to hamstring tightness, unable to fully extend L knee. Balance Overall balance assessment: Needs assistance Sitting-balance support: Single extremity supported, No upper extremity supported Sitting balance-Leahy Scale: Fair Sitting balance - Comments: more easily held long sitting, but due to hamstring tightness, unable to fully extend L knee.    Special needs/care consideration BiPAP/CPAP No CPM No Continuous Drip IV No Dialysis No     Life Vest No Oxygen No Special Bed No Trach Size No Wound Vac (area) No      Skin Stump dressing to L BKA site                             Bowel mgmt: Last BM 12/26/16 Bladder mgmt: Voiding in urinal Diabetic mgmt Yes, not on  medication at home, but on insulin here in hospital Contact Isolation:  Yes, for MRSA   Previous Home Environment Living Arrangements: Alone Available Help at Discharge: Friend(s) Type of Home: House Home Layout: One level Home Access: Ramped entrance Bathroom Shower/Tub: Walk-in shower, Door [employer] Toilet: Handicapped height Bathroom Accessibility: Yes How Accessible: Accessible via wheelchair [place]: Other (Comment) (at SNF) Additional Comments: works as Event organiser at [employer]  Living Setting Plans for Discharge Living Setting: Patient's home, Alone, House (Lives alone.) Type of Home at Discharge: House Discharge Home Layout: Two level, Able to live on main level with bedroom/bathroom Alternate Level Stairs-Number of Steps: Flight Discharge Home Access: Ramped entrance Does the patient have any problems obtaining your medications?: No  Social/Family/Support Systems Patient Roles: Other (Comment) (Has a sister and friends.) Contact Information: Raliegh Ip - sister - [phone] Anticipated Caregiver: self with help from student, Marikay Alar Ability/Limitations of Caregiver: Has local help from a student.  sister lives in [place] and has a friend in [place]. Caregiver Availability: Intermittent Discharge Plan Discussed with Primary Caregiver: Yes (I spoke only with the patient.) Is Caregiver In Agreement with Plan?: Yes Does Caregiver/Family have Issues with Lodging/Transportation while Pt is in Rehab?: No  Goals/Additional Needs Patient/Family Goal for Rehab: PT/OT mod I goals Expected length of stay: 7-10 days Cultural Considerations: None Dietary Needs: Renal, carb mod, fluid restriction, thin liquids Equipment Needs: TBD Pt/Family Agrees to Admission and willing to participate: Yes (Spoke to patient.) Program Orientation Provided & Reviewed with Pt/Caregiver Including Roles  & Responsibilities: Yes  Decrease burden of Care through IP  rehab admission: N/A  Possible need for SNF placement upon discharge: Not planned  Patient Condition: This patient's condition remains as documented in the consult dated 12/26/16, in which the Rehabilitation Physician determined and documented that the patient's condition is appropriate for intensive rehabilitative care in an inpatient rehabilitation facility. Will admit to inpatient rehab today.  Preadmission Screen Completed By:  Retta Diones, 12/27/2016 3:03 PM ______________________________________________________________________   Discussed status with Dr. Posey Pronto on 12/27/16 at  1502 and received telephone approval for admission today.  Admission Coordinator:  Retta Diones, time 1502/Date 12/27/16

## 2016-12-27 NOTE — Progress Notes (Signed)
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 Referring Physician: Dr. Malachi Bonds.    HPI: Jeffery [name] is a 66 y.o. 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 He was discharged to SNF for rehab  but was readmitted on 12/18/16 with N/V, weakness and acute renal failure. He was treated with IVF and started on IV antibiotics as reports of recent staph infection. MRI of foot done revealing osteomyelitis with septic arthritis and myofasciitis.  Nephrology consulted for input on acute on chronic renal failure and felt that renal failure multifactorial and is following for input. 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 Post op lethargy resolving and therapy ongoing. CIR recommended due to significant deficits in mobility and ability to carry out ADL tasks.   Lives alone but was independent and working prior to Feb. He is motivated to get therapy to return to to prior level. Has friends who can check in/assist after discharge.     Review of Systems  HENT: Negative for hearing loss and tinnitus.   Eyes: Negative for blurred vision and double vision.  Respiratory: Negative for cough and shortness of breath.   Cardiovascular: Negative for chest pain and palpitations.  Gastrointestinal: Positive for constipation. Negative for heartburn and nausea.  Genitourinary: Negative for dysuria and urgency.  Musculoskeletal: Negative for back pain, myalgias and neck  pain.  Skin: Negative for itching and rash.  Neurological: Positive for dizziness (with activity). Negative for tingling, sensory change and headaches.  Psychiatric/Behavioral: Negative for depression. The patient is not nervous/anxious and does not have insomnia.          Past Medical History:  Diagnosis Date  . Arthritis   . Asthma   . Bilateral cataracts   . Blurred vision    improved now  . Cholelithiasis   . Diabetes mellitus    type II  . Eczema    inner wrist  . Hypertension   . Muscle cramps   . Necrotizing fasciitis (HCC)   . Neuropathy (HCC)   . Rash    wrist and hands since late june  . Retinal degeneration          Past Surgical History:  Procedure Laterality Date  . ABOVE KNEE LEG AMPUTATION     right  . AMPUTATION Left 12/23/2016   Procedure: AMPUTATION BELOW KNEE;  Surgeon: Eldred Manges, MD;  Location: [facility] OR;  Service: Orthopedics;  Laterality: Left;  . APPLICATION OF WOUND VAC Left 11/06/2016   Procedure: APPLICATION OF WOUND VAC;  Surgeon: Eldred Manges, MD;  Location: [facility] OR;  Service: Orthopedics;  Laterality: Left;  . COLONOSCOPY WITH PROPOFOL N/A 05/18/2016   Procedure: COLONOSCOPY WITH PROPOFOL;  Surgeon: Rachael Fee, MD;  Location: [facility] ENDOSCOPY;  Service: Endoscopy;  Laterality: N/A;  . ESOPHAGOGASTRODUODENOSCOPY (EGD) WITH PROPOFOL N/A 05/18/2016   Procedure: ESOPHAGOGASTRODUODENOSCOPY (EGD) WITH PROPOFOL;  Surgeon: Rachael Fee, MD;  Location: [facility] ENDOSCOPY;  Service: Endoscopy;  Laterality: N/A;  . ESOPHAGOGASTRODUODENOSCOPY (EGD) WITH PROPOFOL N/A 11/01/2016  Procedure: ESOPHAGOGASTRODUODENOSCOPY (EGD) WITH PROPOFOL;  Surgeon: Napoleon Form, MD;  Location: [facility] ENDOSCOPY;  Service: Endoscopy;  Laterality: N/A;  . EYE SURGERY  09/2011   bilateral for retinopathy - laser eye surgery  . I&D EXTREMITY Left 10/25/2016   Procedure: LEFT FOOT DEBRIDEMENT RAY AMPUTATION PLACEMENT OF WOUND VAC;  Surgeon: Eldred Manges, MD;   Location: [facility] OR;  Service: Orthopedics;  Laterality: Left;  . I&D EXTREMITY Left 11/06/2016   Procedure: IRRIGATION AND DEBRIDEMENT EXTREMITY/LEFT FOOT;  Surgeon: Eldred Manges, MD;  Location: [facility] OR;  Service: Orthopedics;  Laterality: Left;  . IR PARACENTESIS  12/20/2016  . left metatarsal  2003   5th  . TOE AMPUTATION     Left big toe          Family History  Problem Relation Age of Onset  . Cancer Mother     unknown type    Social History:  Lives alone. Independent at wheelchair level. Professor at [employer]. He  reports that he has never smoked. He has never used smokeless tobacco. He reports that he does not drink alcohol or use drugs.    Allergies: No Known Allergies          Medications Prior to Admission  Medication Sig Dispense Refill  . atorvastatin (LIPITOR) 40 MG tablet Take 40 mg by mouth daily.  2  . brimonidine (ALPHAGAN P) 0.1 % SOLN Place 1 drop into both eyes daily.    . furosemide (LASIX) 40 MG tablet Take 40 mg by mouth daily.    . insulin aspart (NOVOLOG FLEXPEN) 100 UNIT/ML FlexPen Inject before meals and at bedtime per sliding scale: if BGL is: 200 - 250 = 2 units; 251 - 300 = 4 units; 301 - 350 = 6 units; 351 - 400 = 8 units, if greater than 400 notify MD    . loperamide (IMODIUM) 2 MG capsule Take 1 capsule (2 mg total) by mouth as needed for diarrhea or loose stools. 30 capsule 0  . methocarbamol (ROBAXIN) 500 MG tablet Take 0.5 tablets (250 mg total) by mouth every 8 (eight) hours as needed for muscle spasms. 15 tablet 0  . metoCLOPramide (REGLAN) 5 MG tablet Take 1 tablet (5 mg total) by mouth every 6 (six) hours as needed for nausea. 20 tablet 0  . Multiple Vitamin (MULTIVITAMIN WITH MINERALS) TABS tablet Take 1 tablet by mouth daily.    . nadolol (CORGARD) 20 MG tablet Take 1 tablet (20 mg total) by mouth daily. 30 tablet 0  . oxyCODONE (ROXICODONE) 5 MG immediate release tablet Take one tablet by mouth every 4 hours as needed for  pain (Patient taking differently: Take 5 mg by mouth every 4 (four) hours as needed for severe pain. ) 180 tablet 0  . pantoprazole (PROTONIX) 40 MG tablet Take 1 tablet (40 mg total) by mouth 2 (two) times daily. 60 tablet 0  . saccharomyces boulardii (FLORASTOR) 250 MG capsule Take 1 capsule (250 mg total) by mouth 2 (two) times daily. 10 capsule 0  . spironolactone (ALDACTONE) 100 MG tablet Take 100 mg by mouth daily.     . sucralfate (CARAFATE) 1 GM/10ML suspension Take 10 mLs (1 g total) by mouth 4 (four) times daily -  with meals and at bedtime. (Patient taking differently: Take 1 g by mouth 4 (four) times daily -  before meals and at bedtime. ) 420 mL 0  . [EXPIRED] sulfamethoxazole-trimethoprim (BACTRIM DS,SEPTRA DS) 800-160 MG tablet Take 1  tablet by mouth 2 (two) times daily. FOR 10 DAYS (Start date: 12/12/16)    . UNABLE TO FIND MedPass: Drink 237 ml's by mouth three times a day    . UNABLE TO FIND Protein Liquid: Drink 30 ml's by mouth three times a day for wound healing    . [EXPIRED] vitamin C (ASCORBIC ACID) 500 MG tablet Take 500 mg by mouth 2 (two) times daily. FOR 45 DAYS (Start date: 11/09/16)    . [EXPIRED] Zinc Sulfate (ZINC-220 PO) Take 220 mg by mouth daily.     . Amino Acids-Protein Hydrolys (FEEDING SUPPLEMENT, PRO-STAT SUGAR FREE 64,) LIQD Take 30 mLs by mouth 3 (three) times daily. (Patient not taking: Reported on 12/18/2016) 900 mL 0  . BISMUTH TRIBROMOPH-PETROLATUM EX Apply to right foot topically every Tuesday, Thursday and Saturday    . collagenase (SANTYL) ointment Apply to right heel topically on Tuesday, Thursday and Saturday    . ipratropium-albuterol (DUONEB) 0.5-2.5 (3) MG/3ML SOLN     . wound dressings gel Apply topically as needed for wound care. TheraHoney Gel - Apply to left heel and left plantar foot topically every day      Home: Home Living Family/patient expects to be discharged to:: Skilled nursing facility Living Arrangements:  Alone Available Help at Discharge: Friend(s) Type of Home: House Home Access: Ramped entrance Home Layout: One level Bathroom Shower/Tub: Psychologist, counselling, Door [employer] Toilet: Handicapped height Bathroom Accessibility: Yes Home Equipment: Information systems manager - built in, Wheelchair - manual Additional Comments: works as Psychologist, prison and probation services at [employer]  Functional History: Prior Function Level of Independence: Needs Technical sales engineer / Transfers Assistance Needed: Pt able to transfer I untill about 2 months ago ADL's / Homemaking Assistance Needed: Pt performing ADLs till 2 months ago during his toe amputation Comments: Pt has been immobile ~ 2 month. Pt was transfering  Functional Status:  Mobility: Bed Mobility Overal bed mobility: Needs Assistance Bed Mobility: Supine to Sit Rolling: Max assist, +2 for physical assistance Supine to sit: Mod assist General bed mobility comments: cues for technique Transfers Overall transfer level: Needs assistance Transfers: Counselling psychologist transfers: Max assist, +2 physical assistance General transfer comment: Support trunk while positioning in long sitting--hamstring tightness making sitting more difficult.  Assisted pt with pad and truncal support to scoot posteriorly into the recliner Ambulation/Gait General Gait Details: Not Applicable  ADL: ADL Overall ADL's : Needs assistance/impaired Eating/Feeding: Set up, Sitting Grooming: Set up, Bed level Upper Body Bathing: Minimal assistance, Bed level Lower Body Bathing: Moderate assistance, Bed level Upper Body Dressing : Minimal assistance, Bed level Lower Body Dressing: Maximal assistance, Bed level Functional mobility during ADLs: Maximal assistance, +2 for physical assistance General ADL Comments: Pt will require strengthing of BUE to increase independence in ADLs and functional transfers  Cognition: Cognition Overall Cognitive Status: Within Functional Limits for  tasks assessed Orientation Level: Oriented X4 Cognition Arousal/Alertness: Awake/alert Behavior During Therapy: WFL for tasks assessed/performed Overall Cognitive Status: Within Functional Limits for tasks assessed Area of Impairment: Memory, Following commands, Safety/judgement, Problem solving Memory: Decreased short-term memory Following Commands: Follows one step commands with increased time, Follows one step commands consistently Safety/Judgement: Decreased awareness of safety, Decreased awareness of deficits Problem Solving: Slow processing, Decreased initiation, Difficulty sequencing, Requires verbal cues, Requires tactile cues  Blood pressure 121/62, pulse 70, temperature 98.2 F (36.8 C), temperature source Oral, resp. rate 18, height  (1.854 m), weight 105.2 kg (232 lb), SpO2 98 %. Physical Exam  Nursing note and  vitals reviewed. Constitutional: He is oriented to person, place, and time. He appears well-developed and well-nourished.  Pale, ill appearing male.   HENT:  Head: Normocephalic and atraumatic.  Eyes: Conjunctivae are normal. Pupils are equal, round, and reactive to light.  Neck: Normal range of motion. Neck supple.  Cardiovascular: Normal rate and regular rhythm.   Murmur heard. Respiratory: Effort normal and breath sounds normal. No stridor. No respiratory distress. He has no wheezes.  GI: Soft. Bowel sounds are normal. He exhibits distension. There is no tenderness.  Ascites?  Musculoskeletal:  Prior R-AKA well healed.  L-BKA with compressive dressing.   Neurological: He is alert and oriented to person, place, and time.  Skin: Skin is warm and dry.    Lab Results Last 24 Hours       Results for orders placed or performed during the hospital encounter of 12/18/16 (from the past 24 hour(s))  Glucose, capillary     Status: None   Collection Time: 12/25/16  4:44 PM  Result Value Ref Range   Glucose-Capillary 99 65 - 99 mg/dL  Glucose, capillary      Status: None   Collection Time: 12/25/16 10:15 PM  Result Value Ref Range   Glucose-Capillary 98 65 - 99 mg/dL  Renal function panel     Status: Abnormal   Collection Time: 12/26/16  6:22 AM  Result Value Ref Range   Sodium 135 135 - 145 mmol/L   Potassium 4.6 3.5 - 5.1 mmol/L   Chloride 107 101 - 111 mmol/L   CO2 21 (L) 22 - 32 mmol/L   Glucose, Bld 97 65 - 99 mg/dL   BUN 50 (H) 6 - 20 mg/dL   Creatinine, Ser 2.44 (H) 0.61 - 1.24 mg/dL   Calcium 8.3 (L) 8.9 - 10.3 mg/dL   Phosphorus 3.7 2.5 - 4.6 mg/dL   Albumin 2.2 (L) 3.5 - 5.0 g/dL   GFR calc non Af Amer 23 (L) >60 mL/min   GFR calc Af Amer 26 (L) >60 mL/min   Anion gap 7 5 - 15  Glucose, capillary     Status: None   Collection Time: 12/26/16  7:51 AM  Result Value Ref Range   Glucose-Capillary 93 65 - 99 mg/dL  Glucose, capillary     Status: Abnormal   Collection Time: 12/26/16 11:55 AM  Result Value Ref Range   Glucose-Capillary 162 (H) 65 - 99 mg/dL     Imaging Results (Last 48 hours)  No results found.    Assessment/Plan: Diagnosis: left BKA required because of osteomyelitis LLE. Hx of right AKA 1. Does the need for close, 24 hr/day medical supervision in concert with the patient's rehab needs make it unreasonable for this patient to be served in a less intensive setting? Yes 2. Co-Morbidities requiring supervision/potential complications: CKD, cirrhosis o fliver, pain 3. Due to bladder management, bowel management, safety, skin/wound care, disease management, medication administration, pain management and patient education, does the patient require 24 hr/day rehab nursing? Yes 4. Does the patient require coordinated care of a physician, rehab nurse, PT (1-2 hrs/day, 5 days/week) and OT (1-2 hrs/day, 5 days/week) to address physical and functional deficits in the context of the above medical diagnosis(es)? Yes Addressing deficits in the following areas: balance, endurance, locomotion, strength,  transferring, bowel/bladder control, bathing, dressing, feeding, grooming, toileting and psychosocial support 5. Can the patient actively participate in an intensive therapy program of at least 3 hrs of therapy per day at least 5 days per week?  Yes 6. The potential for patient to make measurable gains while on inpatient rehab is excellent 7. Anticipated functional outcomes upon discharge from inpatient rehab are modified independent  with PT, modified independent with OT, n/a with SLP. 8. Estimated rehab length of stay to reach the above functional goals is: 7-10 days 9. Does the patient have adequate social supports and living environment to accommodate these discharge functional goals? Yes 10. Anticipated D/C setting: Home 11. Anticipated post D/C treatments: HH therapy and Outpatient therapy 12. Overall Rehab/Functional Prognosis: excellent  RECOMMENDATIONS: This patient's condition is appropriate for continued rehabilitative care in the following setting: CIR Patient has agreed to participate in recommended program. Yes Note that insurance prior authorization may be required for reimbursement for recommended care.  Comment: Pt failed previously at SNF. 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 He is motivated to returning home and to work. Rehab Admissions Coordinator to follow up.  Thanks,  Ranelle Oyster, MD, Earlie Counts, PA-C 12/26/2016    Revision History                        Routing History

## 2016-12-28 ENCOUNTER — Encounter (HOSPITAL_COMMUNITY): Payer: Self-pay

## 2016-12-28 ENCOUNTER — Inpatient Hospital Stay (HOSPITAL_COMMUNITY): Payer: BC Managed Care – PPO | Admitting: Occupational Therapy

## 2016-12-28 ENCOUNTER — Inpatient Hospital Stay (HOSPITAL_COMMUNITY): Payer: BC Managed Care – PPO

## 2016-12-28 DIAGNOSIS — Z89611 Acquired absence of right leg above knee: Secondary | ICD-10-CM

## 2016-12-28 DIAGNOSIS — S88112A Complete traumatic amputation at level between knee and ankle, left lower leg, initial encounter: Secondary | ICD-10-CM

## 2016-12-28 LAB — GLUCOSE, CAPILLARY
GLUCOSE-CAPILLARY: 117 mg/dL — AB (ref 65–99)
GLUCOSE-CAPILLARY: 194 mg/dL — AB (ref 65–99)
GLUCOSE-CAPILLARY: 170 mg/dL — AB (ref 65–99)
Glucose-Capillary: 170 mg/dL — ABNORMAL HIGH (ref 65–99)

## 2016-12-28 MED ORDER — GLUCERNA SHAKE PO LIQD
237.0000 mL | Freq: Three times a day (TID) | ORAL | Status: DC
  Administered 2016-12-28 – 2016-12-31 (×6): 237 mL via ORAL

## 2016-12-28 MED ORDER — INSULIN ASPART 100 UNIT/ML ~~LOC~~ SOLN: 0.0000 [IU] | Freq: Every day | SUBCUTANEOUS | Status: DC

## 2016-12-28 MED ORDER — INSULIN ASPART 100 UNIT/ML ~~LOC~~ SOLN
0.0000 [IU] | Freq: Three times a day (TID) | SUBCUTANEOUS | Status: DC
  Administered 2016-12-28 (×2): 2 [IU] via SUBCUTANEOUS
  Administered 2016-12-29: 1 [IU] via SUBCUTANEOUS
  Administered 2016-12-31 – 2017-01-02 (×3): 2 [IU] via SUBCUTANEOUS
  Administered 2017-01-02 – 2017-01-03 (×3): 1 [IU] via SUBCUTANEOUS

## 2016-12-28 MED ORDER — PRO-STAT SUGAR FREE PO LIQD
30.0000 mL | Freq: Two times a day (BID) | ORAL | Status: DC
  Administered 2016-12-28 – 2017-01-01 (×3): 30 mL via ORAL
  Filled 2016-12-28 (×7): qty 30

## 2016-12-28 NOTE — Care Management Note (Signed)
[facility] Individual Statement of Services  Patient Name:  Jeffery [name]  Date:  12/28/2016  Welcome to the [facility].  Our goal is to provide you with an individualized program based on your diagnosis and situation, designed to meet your specific needs.  With this comprehensive rehabilitation program, you will be expected to participate in at least 3 hours of rehabilitation therapies Monday-Friday, with modified therapy programming on the weekends.  Your rehabilitation program will include the following services:  Physical Therapy (PT), Occupational Therapy (OT), 24 hour per day rehabilitation nursing, Therapeutic Recreaction (TR), Neuropsychology, Case Management (Social Worker), Rehabilitation Medicine, Nutrition Services and Pharmacy Services  Weekly team conferences will be held on Wednesday to discuss your progress.  Your Social Worker will talk with you frequently to get your input and to update you on team discussions.  Team conferences with you and your family in attendance may also be held.  Expected length of stay: 15-20 days  Overall anticipated outcome: mod/I wheelchair level-hoping  Depending on your progress and recovery, your program may change. Your Social Worker will coordinate services and will keep you informed of any changes. Your Social Worker's name and contact numbers are listed  below.  The following services may also be recommended but are not provided by the [facility]patient [facility] Center:   Driving Evaluations  Home Health Rehabiltiation Services  Outpatient Rehabilitation Services  Vocational Rehabilitation   Arrangements will be made to provide these services after discharge if needed.  Arrangements include referral to agencies that provide these services.  Your insurance has been verified to be:  [employer] Your primary doctor is:  Adrian Prince  Pertinent information will be shared with your doctor and your  insurance company.  Social Worker:  Dossie Der, SW [phone] or ([phone]  Information discussed with and copy given to patient by: Lucy Chris, 12/28/2016, 1:24 PM

## 2016-12-28 NOTE — Progress Notes (Signed)
Occupational Therapy Session Note  Patient Details  Name: Jeffery [name] MRN: [id number] Date of Birth: 09/05/1951  Today's Date: 12/28/2016 OT Individual Time: 1300-1330 OT Individual Time Calculation (min): 30 min    Short Term Goals: Week 1:  OT Short Term Goal 1 (Week 1): Pt will complete slide board to [facility] with max A of 1. OT Short Term Goal 2 (Week 1): Pt will be able to wash LB with set up only from bed level. OT Short Term Goal 3 (Week 1): Pt will be able to don LB clothing with set up from bed level. OT Short Term Goal 4 (Week 1): Pt will demonstrate improved activity tolerance to participate for at least 10 continuous minutes without a rest break.   Skilled Therapeutic Interventions/Progress Updates:  Pt seen for OT session focusing on functional transfers and education in regards to d/[name] planning. Pt sitting up in w/[name] upon arrival finishing lunch and CSW present. Discussed with pt and CSW OT/PT goals, pt's current level of assist and goal levels. Aiming for mod I with longer LOS, however, may have to re-assess depending on pt's progress and abilities to cope with all other medical complications, pt voiced understanding and agreement He Denied pain, however, voicing being very fatigued and desiring to return to bed.  He completed +2 sliding board transfer back to bed. Min A  And VCs for sequencing/ technique to return to supine. OT assisted RN with rolling pt for skin check. Educated regarding importance of maintaining skin integrity during prolonged w/[name] sitting and during sliding board transfers. Pt reports that he has been able to line up 24/7 assist at d/[name] and very much wants to return home. Pt left in supine at end of session, all needs in reach.   Therapy Documentation Precautions:  Precautions Precautions: Fall Restrictions Weight Bearing Restrictions: Yes RLE Weight Bearing: Non weight bearing LLE Weight Bearing: Non weight bearing Other Position/Activity  Restrictions: NWBing LLE Pain: Pain Assessment Pain Assessment: No/denies pain ADL: ADL ADL Comments: refer to functional navigator  See Function Navigator for Current Functional Status.   Therapy/Group: Individual Therapy  Jeffery [name] 12/28/2016, 1:43 PM

## 2016-12-28 NOTE — Evaluation (Signed)
Occupational Therapy Assessment and Plan  Patient Details  Name: Jeffery [name] MRN: [id number] Date of Birth: 08/18/1951  OT Diagnosis: muscle weakness (generalized) Rehab Potential: Rehab Potential (ACUTE ONLY): Good ELOS: 12-15 days   Today's Date: 12/28/2016 OT Individual Time: 0830-1000 OT Individual Time Calculation (min): 90 min     Problem List:  Patient Active Problem List   Diagnosis Date Noted  . Unilateral complete BKA, left, initial encounter ([place]) 12/27/2016  . Abnormality of gait   . Hypotension   . Type 2 diabetes mellitus with peripheral neuropathy (HCC)   . Abscess of left foot 12/22/2016  . Septic arthritis of left foot ([place]) 12/22/2016  . CKD (chronic kidney disease), stage III 12/18/2016  . Hyponatremia 12/18/2016  . Hyperkalemia 12/18/2016  . Diabetic ulcer of foot associated with type 2 diabetes mellitus, with necrosis of bone ([place]) 12/13/2016  . Depressed affect 12/13/2016  . Leg edema 11/24/2016  . Cirrhosis of liver ([place])   . Multiple duodenal ulcers   . Ulcer of esophagus without bleeding   . Melena   . Chronic liver failure without hepatic coma ([place])   . Advance care planning   . Goals of care, counseling/discussion   . Palliative care by specialist   . End stage liver disease ([place])   . Moderate single current episode of major depressive disorder ([place])   . Anxiety state   . Diaper candidiasis   . Somnolence   . Moderate malnutrition ([place])   . Noninfectious gastroenteritis   . Gastrointestinal hemorrhage   . Diabetic foot ulcer ([place])   . Osteomyelitis of left foot ([place])   . Acute renal failure ([place])   . Abdominal pain   . Diarrhea   . Osteomyelitis ([place])   . C. difficile colitis 10/23/2016  . AKI (acute kidney injury) ([place])   . General weakness   . Gastrointestinal hemorrhage with melena   . Idiopathic hypotension   . Other hyperlipidemia   . Esophageal varices without bleeding ([place])   . Portal hypertensive gastropathy   .  Cirrhosis of liver with ascites ([place]) 05/12/2016  . Special screening for malignant neoplasms, colon 05/12/2016  . Coagulopathy ([place]) 04/28/2016  . Hemolytic anemia ([place]) 03/24/2016  . G6PD deficiency anemia ([place]) 03/24/2016  . Thrombocytopenia ([place]) 03/24/2016  . Generalized psoriasis 03/20/2016  . Gilbert syndrome 03/20/2016  . Pancytopenia, acquired ([place]) 03/20/2016  . Hematemesis without nausea 03/20/2016    Past Medical History:  Past Medical History:  Diagnosis Date  . Arthritis   . Asthma   . Bilateral cataracts   . Blurred vision    improved now  . Cholelithiasis   . Diabetes mellitus    type II  . Eczema    inner wrist  . Hypertension   . Muscle cramps   . Necrotizing fasciitis ([place])   . Neuropathy ([place])   . Rash    wrist and hands since late june  . Retinal degeneration    Past Surgical History:  Past Surgical History:  Procedure Laterality Date  . ABOVE KNEE LEG AMPUTATION     right  . AMPUTATION Left 12/23/2016   Procedure: AMPUTATION BELOW KNEE;  Surgeon: Marybelle Killings, MD;  Location: [facility];  Service: Orthopedics;  Laterality: Left;  . APPLICATION OF WOUND VAC Left 11/06/2016   Procedure: APPLICATION OF WOUND VAC;  Surgeon: Marybelle Killings, MD;  Location: [facility];  Service: Orthopedics;  Laterality: Left;  . COLONOSCOPY WITH PROPOFOL N/A 05/18/2016   Procedure: COLONOSCOPY WITH  PROPOFOL;  Surgeon: Milus Banister, MD;  Location: [facility];  Service: Endoscopy;  Laterality: N/A;  . ESOPHAGOGASTRODUODENOSCOPY (EGD) WITH PROPOFOL N/A 05/18/2016   Procedure: ESOPHAGOGASTRODUODENOSCOPY (EGD) WITH PROPOFOL;  Surgeon: Milus Banister, MD;  Location: [facility];  Service: Endoscopy;  Laterality: N/A;  . ESOPHAGOGASTRODUODENOSCOPY (EGD) WITH PROPOFOL N/A 11/01/2016   Procedure: ESOPHAGOGASTRODUODENOSCOPY (EGD) WITH PROPOFOL;  Surgeon: Mauri Pole, MD;  Location: [facility];  Service: Endoscopy;  Laterality: N/A;  . EYE SURGERY  09/2011   bilateral for retinopathy  - laser eye surgery  . I&D EXTREMITY Left 10/25/2016   Procedure: LEFT FOOT DEBRIDEMENT RAY AMPUTATION PLACEMENT OF WOUND VAC;  Surgeon: Marybelle Killings, MD;  Location: [facility];  Service: Orthopedics;  Laterality: Left;  . I&D EXTREMITY Left 11/06/2016   Procedure: IRRIGATION AND DEBRIDEMENT EXTREMITY/LEFT FOOT;  Surgeon: Marybelle Killings, MD;  Location: [facility];  Service: Orthopedics;  Laterality: Left;  . IR PARACENTESIS  12/20/2016  . left metatarsal  2003   5th  . TOE AMPUTATION     Left big toe    Assessment & Plan Clinical Impression:  Jeffery [name] is a 66 y.o. 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 History taken from chart review and patient. He was discharged to SNF for rehab but was readmitted on 12/18/16 with N/V, weakness and acute renal failure. He was treated with IVF and started on IV antibiotics as reports of recent staph infection. MRI of foot reviewed, inflammation of tissue and bone. Per report, osteomyelitis with septic arthritis and myofasciitis.  He underwent paracentesis of 50 cc yellow fluid on 4/4 for recurrent ascites.  Nephrology consulted for input on acute on chronic renal failure and felt that renal failure multifactorial and is following for input.  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 nephrology Post op lethargy resolving and therapy ongoing. CIR recommended due to significant deficits in mobility and ability to carry out ADL tasks   Patient transferred to CIR on 12/27/2016 .    Patient currently requires mod with basic self-care skills and +2 A for ADL transfers secondary to muscle weakness, decreased cardiorespiratoy endurance and decreased balance strategies.  Prior to hospitalization, patient could complete ADLS  With mod independence 2 months ago. Patient  will benefit from skilled intervention to increase independence with basic self-care skills prior to discharge home independently.  Anticipate patient will require intermittent assistance with IADLs and follow up home health.  OT - End of Session Activity Tolerance: Tolerates < 10 min activity, no significant change in vital signs Endurance Deficit: Yes Endurance Deficit Description: fatigued after supine> sit OT Assessment Rehab Potential (ACUTE ONLY): Good OT Patient demonstrates impairments in the following area(s): Balance;Endurance;Motor OT Basic ADL's Functional Problem(s): Bathing;Dressing;Toileting OT Advanced ADL's Functional Problem(s): Simple Meal Preparation;Light Housekeeping OT Transfers Functional Problem(s): Toilet;Tub/Shower OT Additional Impairment(s): None OT Plan OT Intensity: Minimum of 1-2 x/day, 45 to 90 minutes OT Frequency: 5 out of 7 days OT Duration/Estimated Length of Stay: 12-15 days OT Treatment/Interventions: Balance/vestibular training;Discharge planning;DME/adaptive equipment instruction;Functional mobility training;Self Care/advanced ADL retraining;UE/LE Strength taining/ROM;Therapeutic Exercise;Therapeutic Activities;Patient/family education OT Basic Self-Care Anticipated Outcome(s): mod I OT Toileting Anticipated Outcome(s): mod I OT Bathroom Transfers Anticipated Outcome(s): mod I OT Recommendation Patient destination: Home Follow Up Recommendations: Home health OT Equipment Recommended: To be determined  Skilled Therapeutic Intervention Pt seen for initial evaluation and ADL training from bed level. Pt explained in great detail how he has been managing at home, his equipment, bathroom set up and the medical complications he has had for the last 2 months.  Pt completed self care from bed level with min A, he moves very slowly and needs extra time.   Pt became very fatigued and needed to rest. He did work on UE AROM and sitting balance with reaching  laterally. Pt adjusted back in bed with all needs met.   OT Evaluation Precautions/Restrictions  Precautions Precautions: Fall Restrictions Weight Bearing Restrictions: Yes RLE Weight Bearing: Non weight bearing LLE Weight Bearing: Non weight bearing Other Position/Activity Restrictions: NWBing LLE  Pain Pain Assessment Pain Assessment: No/denies pain Home Living/Prior Functioning Home Living Family/patient expects to be discharged to:: Skilled nursing facility Living Arrangements: Alone Available Help at Discharge: Friend(s) Type of Home: House Home Access: Ramped entrance Home Layout: One level Bathroom Shower/Tub: Gaffer, Door Bathroom Toilet: Handicapped height Additional Comments: 3 grab bars by toilet, 2 in the shower and 2 by the bed, shower has been renovated; has used a Warehouse manager to recliner and for car transfers  Lives With: Alone Prior Function Level of Independence: Independent with basic ADLs, Independent with homemaking with ambulation  Able to Take Stairs?: No Driving: Yes (has a vehicle with hand controls) Vocation: Full time employment Vocation Requirements: works as Event organiser at [employer] ADL ADL ADL Comments: refer to functional navigator Vision/Perception  Vision- History Baseline Vision/History: Wears glasses Wears Glasses: At all times Patient Visual Report: No change from baseline Vision- Assessment Vision Assessment?: No apparent visual deficits Perception Comments: WFL  Cognition Overall Cognitive Status: Within Functional Limits for tasks assessed Orientation Level: Person;Place;Situation Person: Oriented Place: Oriented Situation: Oriented Year: 2018 Month: April Day of Week: Correct Immediate Memory Recall: Sock;Blue;Bed Memory Recall: Sock;Blue;Bed Memory Recall Sock: Without Cue Memory Recall Blue: Without Cue Memory Recall Bed: Without Cue Sensation Sensation Light Touch: Impaired by gross assessment (absent  distal residual limb) Stereognosis: Appears Intact Hot/Cold: Appears Intact Coordination Gross Motor Movements are Fluid and Coordinated: Yes Fine Motor Movements are Fluid and Coordinated: Yes Motor  Motor Motor: Within Functional Limits Mobility  Bed Mobility Bed Mobility: Rolling Right;Rolling Left;Right Sidelying to Sit Rolling Right: With rail;5: Supervision Rolling Right Details: Verbal cues for sequencing Rolling Left: 4: Min assist;With rail Rolling Left Details: Verbal cues for sequencing Rolling Left Details (indicate cue type and reason): assistance to scoot R for room to roll L Right Sidelying to Sit: 3: Mod assist Right Sidelying to Sit Details: Verbal cues for safe use of DME/AE;Manual facilitation for weight shifting;Manual facilitation for weight bearing  Trunk/Postural Assessment  Cervical Assessment Cervical Assessment: Within Functional Limits Thoracic Assessment Thoracic Assessment: Within Functional Limits Lumbar Assessment Lumbar Assessment: Exceptions to [facility] (pt sits with increased wt R hip, truncal crease L waist) Postural Control Postural Control: Within Functional Limits  Balance Balance Balance Assessed: Yes Static Sitting Balance Static Sitting - Level of Assistance: 5: Stand by assistance Dynamic Sitting Balance Dynamic Sitting - Level of Assistance: 5: Stand by assistance Sitting balance - Comments: WFL Static Standing Balance Static Standing - Level of Assistance: Other (comment) (na) Dynamic Standing Balance Dynamic Standing - Level of Assistance: Other (comment) (na) Extremity/Trunk Assessment RUE Assessment RUE Assessment: Within Functional Limits (needs to improve strength for transfers) LUE Assessment LUE Assessment: Within Functional Limits (needs to improve strength for transfers)   See  Function Navigator for Current Functional Status.   Refer to Care Plan for Long Term Goals  Recommendations for other services:  Neuropsych   Discharge Criteria: Patient will be discharged from OT if patient refuses treatment 3 consecutive times without medical reason, if treatment goals not met, if there is a change in medical status, if patient makes no progress towards goals or if patient is discharged from hospital.  The above assessment, treatment plan, treatment alternatives and goals were discussed and mutually agreed upon: by patient  [facility] 12/28/2016, 12:53 PM
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
## 2016-12-28 NOTE — Progress Notes (Signed)
Patient information reviewed and entered into eRehab system by Valentine Kuechle, RN, CRRN, PPS Coordinator.  Information including medical coding and functional independence measure will be reviewed and updated through discharge.     Per nursing patient was given "Data Collection Information Summary for Patients in Inpatient Rehabilitation Facilities with attached "Privacy Act Statement-Health Care Records" upon admission.  

## 2016-12-28 NOTE — Progress Notes (Signed)
   Subjective/Complaints: No Issues overnite  ROS- denies CP, SOB, N/V/D  Objective: Vital Signs: Blood pressure (!) 105/51, pulse 61, temperature 98.5 F (36.9 C), temperature source Oral, resp. rate 18, height  (1.854 m), weight 106.6 kg (235 lb), SpO2 99 %. No results found. Results for orders placed or performed during the hospital encounter of 12/27/16 (from the past 72 hour(s))  Glucose, capillary     Status: Abnormal   Collection Time: 12/27/16  5:01 PM  Result Value Ref Range   Glucose-Capillary 157 (H) 65 - 99 mg/dL   Comment 1 Notify RN   Glucose, capillary     Status: Abnormal   Collection Time: 12/27/16  9:03 PM  Result Value Ref Range   Glucose-Capillary 131 (H) 65 - 99 mg/dL   Comment 1 Notify RN   Glucose, capillary     Status: Abnormal   Collection Time: 12/28/16  6:26 AM  Result Value Ref Range   Glucose-Capillary 117 (H) 65 - 99 mg/dL   Comment 1 Notify RN      HEENT: normal Cardio: RRR and no murmur Resp: CTA B/L and unlabored GI: BS positive and NT, ND Extremity:  Pulses positive and No Edema Skin:   Intact Neuro: Alert/Oriented and Abnormal Motor 4/5 BUE and bilateral HF Musc/Skel:  Other well healed R AKA, Left BKA with ACE wrap Gen NAD   Assessment/Plan: 1. 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 Physiatrist is providing close team supervision and 24 hour management of active medical problems listed below. Physiatrist and rehab team continue to assess barriers to discharge/monitor patient progress toward functional and medical goals. FIM:                                   Medical Problem List and Plan: 1.  Gait abnormality and limitation in self-care secondary to left BKA with history of right AKA. CIR PT, OT evals today 2.  DVT Prophylaxis/Anticoagulation: Pharmaceutical: Heparin 3.  Pain Management: Controlled on tylenol prn 4. Mood: LCSW to follow for evaluation and support 5. Neuropsych: This patient is capable of making decisions on his own behalf. 6. Skin/Wound Care: Routine pressure relief measures. 7. Fluids/Electrolytes/Nutrition: Strict I/O. 1200 cc FR daily, ensure supplement 8. Acute on chronic renal insufficiency: Continue to monitor renal status. Strict I/O.  9. Hypotension: Monitor BP bid--proamatine tid to help with support. Poor fluid intak may need supplemental IVF 10. Cirrhosis of liver with ascites: On Xifaxan bid and lactulose qid 11. T2DM with neuropathy: Hgb A1c- 5.8-- Has been off meds for years. Monitor BS ac/hs.  CBG (last 3)   Recent Labs  12/27/16 1701 12/27/16 2103 12/28/16 0626  GLUCAP 157* 131* 117*    12. Hyponatremia: Follow BMP 13.  Chronic anemia- ? CKD related LOS (Days) 1 A FACE TO FACE EVALUATION WAS PERFORMED  Tata Timmins E 12/28/2016, 7:22 AM

## 2016-12-28 NOTE — Progress Notes (Signed)
Initial Nutrition Assessment  DOCUMENTATION CODES:   Obesity unspecified  INTERVENTION:  Continue Glucerna Shake po TID, each supplement provides 220 kcal and 10 grams of protein.  Continue 30 ml Prostat po BID, each supplement provides 100 kcal and 15 grams of protein.   Encourage adequate PO intake.   NUTRITION DIAGNOSIS:   Increased nutrient needs related to chronic illness as evidenced by estimated needs.  GOAL:   Patient will meet greater than or equal to 90% of their needs  MONITOR:   PO intake, Supplement acceptance, Labs, Weight trends, Skin, I & O's  REASON FOR ASSESSMENT:   Consult Diet education  ASSESSMENT:   66 y.o. 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 He was discharged to SNF for rehab but was readmitted on 12/18/16 with N/V, weakness and acute renal failure. He was treated with IVF and started on IV antibiotics as reports of recent staph infection. MRI of foot reviewed, inflammation of tissue and bone. Per report, osteomyelitis with septic arthritis and myofasciitis.  He underwent paracentesis of 50 cc yellow fluid on 4/4 for recurrent ascites.  Nephrology consulted for input on acute on chronic renal failure and felt that renal failure multifactorial and is following for input.  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   Pt unavailable during attempted time of visit. Pt familiar to this RD from most recent inpatient acute hospitalization. Meal completion has been 50-80%. Pt currently has Glucerna and Prostat ordered. RD to continue with current orders. RD consulted for diet education. RD to provide diet education at next visit. Unable to complete Nutrition-Focused physical exam at this time.   Labs and medications reviewed.   Diet Order:  Diet renal  with fluid restriction Fluid restriction: 1200 mL Fluid; Room service appropriate? Yes; Fluid consistency: Thin  Skin:  Wound (see comment) (wound to buttocks, incision L leg)  Last BM:  4/11  Height:   Ht Readings from Last 1 Encounters:  12/27/16  (1.854 m)    Weight:   Wt Readings from Last 1 Encounters:  12/28/16 235 lb (106.6 kg)    Ideal Body Weight:  72.4 kg (adjusted for R AKA and L BKA)  BMI:  Body mass index is 31 kg/m.  Estimated Nutritional Needs:   Kcal:  2200-2400  Protein:  105-125 grams  Fluid:  1.2 L/day  EDUCATION NEEDS:   Education needs addressed  Roslyn Smiling, MS, RD, LDN Pager # [phone] After hours/ weekend pager # [phone]

## 2016-12-28 NOTE — Evaluation (Signed)
Physical Therapy Assessment and Plan  Patient Details  Name: Jeffery [name] MRN: [id number] Date of Birth: [birth year]  PT Diagnosis: Abnormal posture, Edema, Impaired sensation and Muscle weakness Rehab Potential: Good ELOS: 21   Today's Date: 12/28/2016 PT Individual Time:1050  - 2993, 75 min individual tx      Problem List:  Patient Active Problem List   Diagnosis Date Noted  . Unilateral complete BKA, left, initial encounter ([place]) 12/27/2016  . Abnormality of gait   . Hypotension   . Type 2 diabetes mellitus with peripheral neuropathy (HCC)   . Abscess of left foot 12/22/2016  . Septic arthritis of left foot ([place]) 12/22/2016  . CKD (chronic kidney disease), stage III 12/18/2016  . Hyponatremia 12/18/2016  . Hyperkalemia 12/18/2016  . Diabetic ulcer of foot associated with type 2 diabetes mellitus, with necrosis of bone ([place]) 12/13/2016  . Depressed affect 12/13/2016  . Leg edema 11/24/2016  . Cirrhosis of liver ([place])   . Multiple duodenal ulcers   . Ulcer of esophagus without bleeding   . Melena   . Chronic liver failure without hepatic coma ([place])   . Advance care planning   . Goals of care, counseling/discussion   . Palliative care by specialist   . End stage liver disease ([place])   . Moderate single current episode of major depressive disorder ([place])   . Anxiety state   . Diaper candidiasis   . Somnolence   . Moderate malnutrition ([place])   . Noninfectious gastroenteritis   . Gastrointestinal hemorrhage   . Diabetic foot ulcer ([place])   . Osteomyelitis of left foot ([place])   . Acute renal failure ([place])   . Abdominal pain   . Diarrhea   . Osteomyelitis ([place])   . C. difficile colitis 10/23/2016  . AKI (acute kidney injury) ([place])   . General weakness   . Gastrointestinal hemorrhage with melena   . Idiopathic hypotension   . Other hyperlipidemia   . Esophageal varices without bleeding ([place])   . Portal hypertensive gastropathy   . Cirrhosis of liver with ascites  ([place]) 05/12/2016  . Special screening for malignant neoplasms, colon 05/12/2016  . Coagulopathy ([place]) 04/28/2016  . Hemolytic anemia ([place]) 03/24/2016  . G6PD deficiency anemia ([place]) 03/24/2016  . Thrombocytopenia ([place]) 03/24/2016  . Generalized psoriasis 03/20/2016  . Gilbert syndrome 03/20/2016  . Pancytopenia, acquired ([place]) 03/20/2016  . Hematemesis without nausea 03/20/2016    Past Medical History:  Past Medical History:  Diagnosis Date  . Arthritis   . Asthma   . Bilateral cataracts   . Blurred vision    improved now  . Cholelithiasis   . Diabetes mellitus    type II  . Eczema    inner wrist  . Hypertension   . Muscle cramps   . Necrotizing fasciitis ([place])   . Neuropathy ([place])   . Rash    wrist and hands since late june  . Retinal degeneration    Past Surgical History:  Past Surgical History:  Procedure Laterality Date  . ABOVE KNEE LEG AMPUTATION     right  . AMPUTATION Left 12/23/2016   Procedure: AMPUTATION BELOW KNEE;  Surgeon: Marybelle Killings, MD;  Location: [facility];  Service: Orthopedics;  Laterality: Left;  . APPLICATION OF WOUND VAC Left 11/06/2016   Procedure: APPLICATION OF WOUND VAC;  Surgeon: Marybelle Killings, MD;  Location: [facility];  Service: Orthopedics;  Laterality: Left;  . COLONOSCOPY WITH PROPOFOL N/A 05/18/2016   Procedure: COLONOSCOPY WITH  PROPOFOL;  Surgeon: Milus Banister, MD;  Location: [facility];  Service: Endoscopy;  Laterality: N/A;  . ESOPHAGOGASTRODUODENOSCOPY (EGD) WITH PROPOFOL N/A 05/18/2016   Procedure: ESOPHAGOGASTRODUODENOSCOPY (EGD) WITH PROPOFOL;  Surgeon: Milus Banister, MD;  Location: [facility];  Service: Endoscopy;  Laterality: N/A;  . ESOPHAGOGASTRODUODENOSCOPY (EGD) WITH PROPOFOL N/A 11/01/2016   Procedure: ESOPHAGOGASTRODUODENOSCOPY (EGD) WITH PROPOFOL;  Surgeon: Mauri Pole, MD;  Location: [facility];  Service: Endoscopy;  Laterality: N/A;  . EYE SURGERY  09/2011   bilateral for retinopathy - laser eye surgery  . I&D  EXTREMITY Left 10/25/2016   Procedure: LEFT FOOT DEBRIDEMENT RAY AMPUTATION PLACEMENT OF WOUND VAC;  Surgeon: Marybelle Killings, MD;  Location: [facility];  Service: Orthopedics;  Laterality: Left;  . I&D EXTREMITY Left 11/06/2016   Procedure: IRRIGATION AND DEBRIDEMENT EXTREMITY/LEFT FOOT;  Surgeon: Marybelle Killings, MD;  Location: [facility];  Service: Orthopedics;  Laterality: Left;  . IR PARACENTESIS  12/20/2016  . left metatarsal  2003   5th  . TOE AMPUTATION     Left big toe    Assessment & Plan Clinical Impression:Jeffery Flurchickis a 66 y.o.malewith history of HTN, T2DM with neuropathy, End stage liver disease, R-AKA, 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 History taken from chart review and patient. He was discharged to SNF for rehab but was readmitted on 12/18/16 with N/V,weakness and acute renal failure. He was treated with IVF and started on IV antibiotics as reports of recent staph infection. MRI of foot reviewed, inflammation of tissue and bone. Per report, osteomyelitis with septic arthritis and myofasciitis. He underwent paracentesis of 50 cc yellow fluid on 4/4 for recurrent ascites.  Nephrology consulted for input on acute on chronic renal failure and felt that renal failure multifactorial and is following for input.  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  Patient transferred to CIR on 12/27/2016 .  Pt is anemic.   Patient currently requires total with mobility secondary to muscle weakness and muscle joint tightness, decreased cardiorespiratoy endurance and decreased sitting balance, decreased postural control and decreased balance strategies.  Prior to hospitalization, patient was modified independent  with mobility and lived with Alone in a House home.  Home access is  Ramped entrance. (pt was living in SNF immediately before this admission)  Patient will benefit  from skilled PT intervention to maximize safe functional mobility, minimize fall risk and decrease caregiver burden for planned discharge home with intermittent assist.  Anticipate patient will benefit from follow up [facility] at discharge.  PT - End of Session Activity Tolerance: Tolerates < 10 min activity with changes in vital signs Endurance Deficit: Yes Endurance Deficit Description: fatigued after supine> sit PT Assessment Rehab Potential (ACUTE/IP ONLY): Good Barriers to Discharge: Decreased caregiver support PT Patient demonstrates impairments in the following area(s): Balance;Edema;Endurance;Pain;Sensory PT Transfers Functional Problem(s): Bed Mobility;Bed to Chair;Car;Furniture PT Locomotion Functional Problem(s): Wheelchair Mobility PT Plan PT Intensity: Minimum of 1-2 x/day ,45 to 90 minutes PT Frequency: 5 out of 7 days PT Duration Estimated Length of Stay: 21 PT Treatment/Interventions: Balance/vestibular training;Discharge planning;Community reintegration;DME/adaptive equipment instruction;Functional mobility training;Patient/family education;Pain management;Neuromuscular re-education;Psychosocial support;Splinting/orthotics;Therapeutic Exercise;Therapeutic Activities;UE/LE Strength taining/ROM;UE/LE Coordination activities;Wheelchair propulsion/positioning PT Transfers Anticipated Outcome(s): modifiied independent basic; supervision/set up car PT Locomotion Anticipated Outcome(s): modified independent manual w/c x 250' in controlled and 67' in home; supervison x 250' in community setting, including 3% ramp PT Recommendation Recommendations for  Other Services: Neuropsych consult (tearful during eval) Follow Up Recommendations: Home health PT Patient destination: Home (pt wants to go home, not SNF) Equipment Recommended: Wheelchair (measurements);Wheelchair cushion (measurements);To be determined Equipment Details: pt does not want to use Advanced, but does not have a local vendor yet;  he needs power w/c eval; repair or new cushion for manual w/c; amputee pad L for manual w/c.  manual w/c and cushion  will be 66 years old 5/18; he purchased online  Skilled Therapeutic Intervention. eval completed; mobility limited by fatigue, tearfulness.  PT explained Neuropsych, but pt stated he was not interested. Neoma Laming, RN agreed that pt was tearful earlier, as well. During slide board transfer, +2 reqwuired due to pt not being able to progress across board, unable to elevate hips, and safety risk of him sliding forward due to bil amputations. Pt stated he wanted to have 2 power chairs, one at home, one at work.  Present manual w/c will be 66 years old at end of 5/18.  Power w/c eval suggested ASAP. Pt left resting in w/c with all needs within reach, set up for lunch..   PT Evaluation Precautions/Restrictions- NWB LLE; falls   General   Vital SignsTherapy Vitals Temp: 98 F (36.7 C) Temp Source: Oral Pulse Rate: 63 Resp: 20 BP: (!) 113/56 Patient Position (if appropriate): Lying Oxygen Therapy SpO2: 100 % O2 Device: Not Delivered Pain- stated none at rest; grimaced with L knee extension onto amputee pad   Home Living/Prior Functioning Home Living Living Arrangements: Alone Available Help at Discharge: Friend(s) Type of Home: House Home Access: Ramped entrance Home Layout: One level Bathroom Shower/Tub: Walk-in shower;Door Bathroom Toilet: Handicapped height Additional Comments: 3 grab bars by toilet, 2 in the shower and 2 by the bed, shower has been renovated; has used a Warehouse manager to recliner and for car transfers  Lives With: Alone Prior Function Level of Independence: Independent with basic ADLs;Independent with homemaking with ambulation  Able to Take Stairs?: No Driving: Yes (has a vehicle with hand controls) Vocation: Full time employment Vocation Requirements: works as Event organiser at [employer] Vision/Perception - wears glasses; no change from baseline     Cognition Overall Cognitive Status: Within Functional Limits for tasks assessed Orientation Level: Oriented X4 Memory: Impaired (fair historian for info to various therapists) Sensation Sensation Light Touch: Impaired by gross assessment (absent distal residual limb) Coordination Gross Motor Movements are Fluid and Coordinated: Yes Fine Motor Movements are Fluid and Coordinated: No Motor  Motor Motor: Within Functional Limits  Mobility Bed Mobility Bed Mobility: Rolling Right;Rolling Left;Sit to Sidelying Right Rolling Right: 1: +2 Total assist Rolling Right: Patient Percentage: 30% Rolling Right Details: Verbal cues for sequencing Sit to Sidelying Right: 4: Min guard Sit to Sidelying Right Details: Verbal cues for technique;Verbal cues for sequencing Locomotion  Ambulation Ambulation: No Gait Gait: No Stairs / Additional Locomotion Stairs: No Wheelchair Mobility Wheelchair Mobility: Yes Wheelchair Assistance: 5: Careers information officer: Both upper extremities Wheelchair Parts Management: Needs assistance Distance: 200  Trunk/Postural Assessment  Cervical Assessment Cervical Assessment: Within Functional Limits Thoracic Assessment Thoracic Assessment: Within Functional Limits Lumbar Assessment Lumbar Assessment: Exceptions to [facility] (pt sits with increased wt R hip, truncal crease L waist) Postural Control Postural Control: Deficits on evaluation Righting Reactions: deleayed and inadequate Postural Limitations: due to ascites in R abdomen, pt sits asymmetrically, leaning R, with R hip ER  Balance Balance Balance Assessed: Yes Static Sitting Balance Static Sitting - Level of Assistance: 5: Stand by  assistance Dynamic Sitting Balance Dynamic Sitting - Level of Assistance: 5: Stand by assistance Static Standing Balance Static Standing - Level of Assistance: Other (comment) (na) Dynamic Standing Balance Dynamic Standing - Level of Assistance: Other  (comment) (na) Extremity Assessment      RLE Assessment RLE Assessment: Exceptions to [facility] RLE Strength RLE Overall Strength Comments: grossly in sitting 4+/5 hip flex/ext/abd/adduction LLE Assessment LLE Assessment: Exceptions to [facility] (edematous) LLE Strength LLE Overall Strength Comments: grossly in sitting: 4/5 hip flex/abd/adduction; at least 3/5 knee ext/flex no resistance given   See Function Navigator for Current Functional Status.   Refer to Care Plan for Long Term Goals  Recommendations for other services: Neuropsych and Therapeutic Recreation  Pet therapy, Stress management and Outing/community reintegration  Discharge Criteria: Patient will be discharged from PT if patient refuses treatment 3 consecutive times without medical reason, if treatment goals not met, if there is a change in medical status, if patient makes no progress towards goals or if patient is discharged from hospital.  The above assessment, treatment plan, treatment alternatives and goals were discussed and mutually agreed upon: by patient  Evander Macaraeg 12/28/2016, 4:49 PM

## 2016-12-29 ENCOUNTER — Inpatient Hospital Stay (HOSPITAL_COMMUNITY): Payer: BC Managed Care – PPO

## 2016-12-29 ENCOUNTER — Encounter (HOSPITAL_COMMUNITY): Payer: Self-pay | Admitting: Student

## 2016-12-29 ENCOUNTER — Inpatient Hospital Stay (HOSPITAL_COMMUNITY): Payer: BC Managed Care – PPO | Admitting: Occupational Therapy

## 2016-12-29 ENCOUNTER — Inpatient Hospital Stay (HOSPITAL_COMMUNITY): Payer: BC Managed Care – PPO | Admitting: Physical Therapy

## 2016-12-29 HISTORY — PX: IR PARACENTESIS: IMG2679

## 2016-12-29 LAB — GLUCOSE, PLEURAL OR PERITONEAL FLUID: Glucose, Fluid: 152 mg/dL

## 2016-12-29 LAB — RENAL FUNCTION PANEL
Albumin: 2 g/dL — ABNORMAL LOW (ref 3.5–5.0)
Anion gap: 8 (ref 5–15)
BUN: 49 mg/dL — ABNORMAL HIGH (ref 6–20)
CO2: 19 mmol/L — ABNORMAL LOW (ref 22–32)
CREATININE: 2.84 mg/dL — AB (ref 0.61–1.24)
Calcium: 8.2 mg/dL — ABNORMAL LOW (ref 8.9–10.3)
Chloride: 103 mmol/L (ref 101–111)
GFR, EST AFRICAN AMERICAN: 25 mL/min — AB (ref >60)
GFR, EST NON AFRICAN AMERICAN: 22 mL/min — AB (ref >60)
Glucose, Bld: 145 mg/dL — ABNORMAL HIGH (ref 65–99)
PHOSPHORUS: 3 mg/dL (ref 2.5–4.6)
Potassium: 4.3 mmol/L (ref 3.5–5.1)
SODIUM: 130 mmol/L — AB (ref 135–145)

## 2016-12-29 LAB — BODY FLUID CELL COUNT WITH DIFFERENTIAL
Lymphs, Fluid: 31 %
MONOCYTE-MACROPHAGE-SEROUS FLUID: 46 % — AB (ref 50–90)
NEUTROPHIL FLUID: 23 % (ref 0–25)
Total Nucleated Cell Count, Fluid: 63 cu mm (ref 0–1000)

## 2016-12-29 LAB — ALBUMIN, PLEURAL OR PERITONEAL FLUID

## 2016-12-29 LAB — GLUCOSE, CAPILLARY
GLUCOSE-CAPILLARY: 135 mg/dL — AB (ref 65–99)
GLUCOSE-CAPILLARY: 124 mg/dL — AB (ref 65–99)
Glucose-Capillary: 112 mg/dL — ABNORMAL HIGH (ref 65–99)
Glucose-Capillary: 113 mg/dL — ABNORMAL HIGH (ref 65–99)

## 2016-12-29 LAB — CBC WITH DIFFERENTIAL/PLATELET
Basophils Absolute: 0 10*3/uL (ref 0.0–0.1)
Basophils Relative: 0 %
EOS ABS: 0.6 10*3/uL (ref 0.0–0.7)
Eosinophils Relative: 12 %
HCT: 23.2 % — ABNORMAL LOW (ref 39.0–52.0)
HEMOGLOBIN: 7.7 g/dL — AB (ref 13.0–17.0)
Lymphocytes Relative: 24 %
Lymphs Abs: 1.2 10*3/uL (ref 0.7–4.0)
MCH: 32.6 pg (ref 26.0–34.0)
MCHC: 33.2 g/dL (ref 30.0–36.0)
MCV: 98.3 fL (ref 78.0–100.0)
Monocytes Absolute: 0.6 10*3/uL (ref 0.1–1.0)
Monocytes Relative: 12 %
NEUTROS PCT: 52 %
Neutro Abs: 2.6 10*3/uL (ref 1.7–7.7)
Platelets: 68 10*3/uL — ABNORMAL LOW (ref 150–400)
RBC: 2.36 MIL/uL — AB (ref 4.22–5.81)
RDW: 19.1 % — ABNORMAL HIGH (ref 11.5–15.5)
WBC: 5 10*3/uL (ref 4.0–10.5)

## 2016-12-29 LAB — GRAM STAIN: Gram Stain: NONE SEEN

## 2016-12-29 LAB — PROTIME-INR
INR: 2.36
PROTHROMBIN TIME: 26.3 s — AB (ref 11.4–15.2)

## 2016-12-29 LAB — APTT: APTT: 113 s — AB (ref 24–36)

## 2016-12-29 MED ORDER — LIDOCAINE HCL 1 % IJ SOLN
INTRAMUSCULAR | Status: AC
  Administered 2016-12-29: 13:00:00
  Filled 2016-12-29: qty 20

## 2016-12-29 MED ORDER — LIDOCAINE HCL 2 % EX GEL
1.0000 "application " | CUTANEOUS | Status: DC | PRN
  Administered 2016-12-29 – 2016-12-30 (×3): 1 via TOPICAL
  Filled 2016-12-29: qty 5

## 2016-12-29 NOTE — Progress Notes (Signed)
Patient refused morning and afternoon medications due to not feeling well all day. Patient received insulin when CBG's called for it. Marissa Nestle, PA-C notified of patient status.

## 2016-12-29 NOTE — Progress Notes (Signed)
Occupational Therapy Session Note  Patient Details  Name: Jeffery [name] MRN: [id number] Date of Birth: [birth year]  Today's Date: 12/29/2016 OT Individual Time: 1350-1405 OT Individual Time Calculation (min): 15 min    Short Term Goals: Week 1:  OT Short Term Goal 1 (Week 1): Pt will complete slide board to [facility] with max A of 1. OT Short Term Goal 2 (Week 1): Pt will be able to wash LB with set up only from bed level. OT Short Term Goal 3 (Week 1): Pt will be able to don LB clothing with set up from bed level. OT Short Term Goal 4 (Week 1): Pt will demonstrate improved activity tolerance to participate for at least 10 continuous minutes without a rest break.   Skilled Therapeutic Interventions/Progress Updates:    Entered room with pt supine in bed. Pt refusing to participate in OOB activity d/t  Just returning from procedure. Educated pt on importance of participating in therapy to increase strength/endurance required in ADLs. Pt reports understanding and will attempt OOB activity with PT if feeling better. Educated pt on safety plan, use of maxi move with nursing for toileting and suggested practicing with PT in later session. Pt verbalizes understanding and reports feeling very down since becoming ill. OT provided support/encouragement and engaged pt in discussion of hobbies/work/discharge. Pt began to discuss favorite books/movies/comics and enthusiasm to return to work/home. Exited session with pt supine in bed with call light in reach   Therapy Documentation Precautions:  Precautions Precautions: Fall Restrictions Weight Bearing Restrictions: Yes RLE Weight Bearing: Non weight bearing LLE Weight Bearing: Non weight bearing Other Position/Activity Restrictions: NWBing LLE General: General OT Amount of Missed Time: 25 Minutes Vital Signs: Therapy Vitals Temp: 99 F (37.2 C) Temp Source: Oral Pulse Rate: 63 Resp: 18 BP: (!) 103/57 (RN notified) Patient Position (if  appropriate): Lying Oxygen Therapy SpO2: 99 % O2 Device: Not Delivered Pain:   ADL: ADL ADL Comments: refer to functional navigator Exercises:   Other Treatments:    See Function Navigator for Current Functional Status.   Therapy/Group: Individual Therapy  [name] 12/29/2016, 5:30 PM

## 2016-12-29 NOTE — Progress Notes (Signed)
Occupational Therapy Session Note  Patient Details  Name: Maleek Craver MRN: [id number] Date of Birth: 1951/03/28  Today's Date: 12/29/2016 OT Missed Time:  75 min  Missed Time Reason:   Pt at radiology per PA.   Attempted to see pt for scheduled OT session, however pt at radiology. Pt missed 75 min skilled OT services. Will follow up as available to make up time.   Therapy Documentation Precautions:  Precautions Precautions: Fall Restrictions Weight Bearing Restrictions: Yes RLE Weight Bearing: Non weight bearing LLE Weight Bearing: Non weight bearing Other Position/Activity Restrictions: NWBing LLE General: Other Treatments:    See Function Navigator for Current Functional Status.   Therapy/Group: Individual Therapy  [name] 12/29/2016, 1:13 PM

## 2016-12-29 NOTE — Progress Notes (Signed)
Subjective/Complaints:  Slept OK very tired after therapy  ROS- denies CP, SOB, N/V/D  Objective: Vital Signs: Blood pressure (!) 106/52, pulse 64, temperature 98.8 F (37.1 C), temperature source Oral, resp. rate 18, height  (1.854 m), weight 106.9 kg (235 lb 10.8 oz), SpO2 100 %. No results found. Results for orders placed or performed during the hospital encounter of 12/27/16 (from the past 72 hour(s))  Glucose, capillary     Status: Abnormal   Collection Time: 12/27/16  5:01 PM  Result Value Ref Range   Glucose-Capillary 157 (H) 65 - 99 mg/dL   Comment 1 Notify RN   Glucose, capillary     Status: Abnormal   Collection Time: 12/27/16  9:03 PM  Result Value Ref Range   Glucose-Capillary 131 (H) 65 - 99 mg/dL   Comment 1 Notify RN   Glucose, capillary     Status: Abnormal   Collection Time: 12/28/16  6:26 AM  Result Value Ref Range   Glucose-Capillary 117 (H) 65 - 99 mg/dL   Comment 1 Notify RN   Glucose, capillary     Status: Abnormal   Collection Time: 12/28/16 12:15 PM  Result Value Ref Range   Glucose-Capillary 170 (H) 65 - 99 mg/dL  Glucose, capillary     Status: Abnormal   Collection Time: 12/28/16  4:54 PM  Result Value Ref Range   Glucose-Capillary 194 (H) 65 - 99 mg/dL  Glucose, capillary     Status: Abnormal   Collection Time: 12/28/16  8:53 PM  Result Value Ref Range   Glucose-Capillary 170 (H) 65 - 99 mg/dL  Glucose, capillary     Status: Abnormal   Collection Time: 12/29/16  6:33 AM  Result Value Ref Range   Glucose-Capillary 135 (H) 65 - 99 mg/dL     HEENT: normal Cardio: RRR and no murmur Resp: CTA B/L and unlabored GI: BS positive and NT, ND Extremity:  Pulses positive and No Edema Skin:   Intact Neuro: Alert/Oriented and Abnormal Motor 4/5 BUE and bilateral HF Musc/Skel:  Other well healed R AKA, Left BKA with ACE wrap Gen NAD   Assessment/Plan: 1. 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 Physiatrist is providing close team supervision and 24 hour management of active medical problems listed below. Physiatrist and rehab team continue to assess barriers to discharge/monitor patient progress toward functional and medical goals. FIM: Function - Bathing Position: Bed Body parts bathed by patient: Right arm, Left arm, Chest, Abdomen, Front perineal area, Buttocks, Left upper leg Body parts bathed by helper: Back Bathing not applicable: Right upper leg, Right lower leg, Left lower leg  Function- Upper Body Dressing/Undressing What is the patient wearing?: Pull over shirt/dress Pull over shirt/dress - Perfomed by patient: Thread/unthread right sleeve, Thread/unthread left sleeve, Put head through opening, Pull shirt over trunk Assist Level: Set up Function - Lower Body Dressing/Undressing What is the patient wearing?: Pants Position: Bed Pants- Performed by patient: Thread/unthread right pants leg, Thread/unthread left pants leg Pants- Performed by helper: Pull pants up/down        Function - Chair/bed transfer Chair/bed transfer method: Lateral scoot Chair/bed transfer assist level: 2 helpers Chair/bed transfer assistive device: Sliding board Chair/bed transfer details: Manual facilitation for weight shifting, Verbal cues for safe use of DME/AE, Verbal cues for precautions/safety, Verbal cues for technique, Tactile cues for posture, Manual facilitation  for placement  Function - Locomotion: Wheelchair Will patient use wheelchair at discharge?: Yes Type: Manual Max wheelchair distance: 250 Assist Level: Supervision or verbal cues Assist Level: Supervision or verbal cues Assist Level: Supervision or verbal cues Turns around,maneuvers to table,bed, and toilet,negotiates 3% grade,maneuvers on rugs and over doorsills: No Function - Locomotion: Ambulation Ambulation activity did not  occur: N/A  Function - Comprehension Comprehension: Auditory Comprehension assist level: Follows complex conversation/direction with no assist  Function - Expression Expression: Verbal Expression assist level: Expresses complex ideas: With no assist  Function - Social Interaction Social Interaction assist level: Interacts appropriately 90% of the time - Needs monitoring or encouragement for participation or interaction.  Function - Problem Solving Problem solving assist level: Solves complex problems: Recognizes & self-corrects  Function - Memory Memory assist level: Complete Independence: No helper Patient normally able to recall (first 3 days only): Current season, Location of own room, Staff names and faces, That he or she is in a hospital   Medical Problem List and Plan: 1.  Gait abnormality and limitation in self-care secondary to left BKA with history of right AKA. CIR PT, OT cont rehab 2.  DVT Prophylaxis/Anticoagulation: Pharmaceutical: Heparin 3. Pain Management: Controlled on tylenol prn 4. Mood: LCSW to follow for evaluation and support 5. Neuropsych: This patient is capable of making decisions on his own behalf. 6. Skin/Wound Care: Routine pressure relief measures.wound healing will be a challenge due to hypoalb 7. Fluids/Electrolytes/Nutrition: Strict I/O. 1200 cc FR daily, ensure supplement, appreciate dietary note 8. Acute on chronic renal insufficiency: Continue to monitor renal status. Strict I/O.  9. Hypotension: Monitor BP bid--proamatine tid to help with support. Poor fluid intak may need supplemental IVF 10. Cirrhosis of liver with ascites: On Xifaxan bid and lactulose qid 11. T2DM with neuropathy: Hgb A1c- 5.8-- Has been off meds for years. Monitor BS ac/hs.  CBG (last 3)   Recent Labs  12/28/16 1654 12/28/16 2053 12/29/16 0633  GLUCAP 194* 170* 135*    12. Hyponatremia: Follow BMP 13.  Chronic anemia- ? CKD related LOS (Days) 2 A FACE TO FACE  EVALUATION WAS PERFORMED  KIRSTEINS,ANDREW E 12/29/2016, 7:30 AM

## 2016-12-29 NOTE — Procedures (Signed)
PROCEDURE SUMMARY:  Successful US guided paracentesis from left lateral abdomen.  Yielded 1.0 of clear, yellow fluid.  No immediate complications.  Pt tolerated well.   Specimen was sent for labs.  Hoyt Koch PA-C 12/29/2016 1:31 PM

## 2016-12-29 NOTE — Progress Notes (Addendum)
Occupational Therapy Session Note  Patient Details  Name: Jeffery [name] MRN: [id number] Date of Birth: 1950-10-30  Today's Date: 12/29/2016 OT Individual Time: 0930-1020 OT Individual Time Calculation (min): 50 min  OT Missed Time: 25 min (pt fatigue)   Short Term Goals: Week 1:  OT Short Term Goal 1 (Week 1): Pt will complete slide board to [facility] with max A of 1. OT Short Term Goal 2 (Week 1): Pt will be able to wash LB with set up only from bed level. OT Short Term Goal 3 (Week 1): Pt will be able to don LB clothing with set up from bed level. OT Short Term Goal 4 (Week 1): Pt will demonstrate improved activity tolerance to participate for at least 10 continuous minutes without a rest break.   Skilled Therapeutic Interventions/Progress Updates:    Pt seen for OT session focusing on upright tolerance, bed mobility, and coping. Pt in supine upon arrival with friend present who exited as tx session began. Pt with complaints of severe nausea and fatigue, unsure of what he would be able to do today. He was agreeable to attempting what he was able, denying for therapist to ask RN for medications.  He transferred to EOB using hospital bed functions. Sitting EOB, he was able to drink some juice however declined any food. Whie seated EOB, spoke with pt in depth regarding his d/c plans, life goals, coping, etc. Pt appearing very depressed talking about end of life, what heaven will be like, etc. When therapist asked "what can I do to help you?" he responded "Just take a gun to me and quit waisting your resources". 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   He declined bathing this session, willing to attempt to put on clothes. Dressed UB seated EOB with set-up assist.  He was able to thread LEs into pants. He returned to supine level to attempt to pull up pants, however, with  increased pain and nausea with mobility and rolling. Pt then declined to pull pants up completely and desiring to stay in supine as he couldn't tolerate anymore.  Set-up pt with music playing for emotional support/ distraction. RN entering room and made aware of complaints throughout session.  OT spoke with PA and CSW regarding conversations taking place during session as pt's current physical and emotional state.   Therapy Documentation Precautions:  Precautions Precautions: Fall Restrictions Weight Bearing Restrictions: Yes RLE Weight Bearing: Non weight bearing LLE Weight Bearing: Non weight bearing Other Position/Activity Restrictions: NWBing LLE Pain: Pain Assessment Pain Assessment: Faces Faces Pain Scale: Hurts even more Pain Intervention(s): Repositioned;Other (Comment) (refused any medication) ADL: ADL ADL Comments: refer to functional navigator  See Function Navigator for Current Functional Status.   Therapy/Group: Individual Therapy  [name], Christne Platts C 12/29/2016, 7:07 AM

## 2016-12-29 NOTE — Progress Notes (Signed)
Physical Therapy Session Note  Patient Details  Name: Jeffery [name] MRN: [id number] Date of Birth: Oct 04, 1950  Today's Date: 12/29/2016 PT Individual Time: 4098-1191 PT Individual Time Calculation (min): 55 min   Short Term Goals: Week 1:  PT Short Term Goal 1 (Week 1): pt will move supine> sit with bedrails, to R, with supervision consistently PT Short Term Goal 2 (Week 1): pt will transfer using slide board with assist of 1 person PT Short Term Goal 3 (Week 1): pt will propel w/c up/down 3% grade ramp with supervison PT Short Term Goal 4 (Week 1): pt will participate in power w/c evaluation  Skilled Therapeutic Interventions/Progress Updates:   Pt received supine in bed and agreeable to PT at bed level due to abdominal pain and nausea.  UE therex. Chest press with weighted bar 2xx 1 minutes, shoulder press with 1# weight bar 2x1 minute. tricep extension x 15 BLE with level 3 tband, lat pull down x 10 BLE diagonal ball raises x 30 seconds on the L and x 15 seconds on the R due to pain in obdomin.   LE therex.LLE knee extension.flexion. Bilateral hip adduction/abduction. Hip flexion/extension.   Rolling in bed to R and L. With mod-max assist from PT due to pain in abdomin.   Pt left supine in bed with call bell in reach.       Therapy Documentation Precautions:  Precautions Precautions: Fall Restrictions Weight Bearing Restrictions: Yes RLE Weight Bearing: Non weight bearing LLE Weight Bearing: Non weight bearing Other Position/Activity Restrictions: NWBing LLE Vital Signs: Therapy Vitals Temp: 99 F (37.2 C) Temp Source: Oral Pulse Rate: 63 Resp: 18 BP: (!) 103/57 (RN notified) Patient Position (if appropriate): Lying Oxygen Therapy SpO2: 99 % O2 Device: Not Delivered   See Function Navigator for Current Functional Status.   Therapy/Group: Individual Therapy  Golden Pop 12/29/2016, 5:36 PM

## 2016-12-29 NOTE — Progress Notes (Signed)
Assessment/Plan: 1 AKI /CKD3 2 Cirrhosis w/ ascites 3 DM 4 PVD post new L BKA  5 Abd pain, r/o SBP   Subjective: Interval History: c/o abd tenderness  Objective: Vital signs in last 24 hours: Temp:  [98 F (36.7 C)-98.8 F (37.1 C)] 98.8 F (37.1 C) (04/13 0528) Pulse Rate:  [63-64] 64 (04/13 0528) Resp:  [18-20] 18 (04/13 0528) BP: (106-113)/(52-99) 112/99 (04/13 1317) SpO2:  [100 %] 100 % (04/13 0528) Weight:  [106.9 kg (235 lb 10.8 oz)] 106.9 kg (235 lb 10.8 oz) (04/13 0528) Weight change: -0.149 kg (-5.3 oz)  Intake/Output from previous day: 04/12 0701 - 04/13 0700 In: 781 [P.O.:780] Out: 323 [Urine:322; Stool:1] Intake/Output this shift: Total I/O In: 120 [P.O.:120] Out: -   General appearance: alert and cooperative Resp: clear to auscultation bilaterally Chest wall: no tenderness Cardio: regular rate and rhythm, S1, S2 normal, no murmur, click, rub or gallop Gdifuse enlargemnent, ascites, tender upper and loer abddifuse enlargemnent, ascites, tender upper and loer abd  Lab Results:  Recent Labs  12/29/16 1131  WBC 5.0  HGB 7.7*  HCT 23.2*  PLT 68*   BMET:  Recent Labs  12/27/16 0420 12/29/16 1032  NA 134* 130*  K 4.4 4.3  CL 106 103  CO2 24 19*  GLUCOSE 177* 145*  BUN 51* 49*  CREATININE 2.84* 2.84*  CALCIUM 8.3* 8.2*   No results for input(s): PTH in the last 72 hours. Iron Studies: No results for input(s): IRON, TIBC, TRANSFERRIN, FERRITIN in the last 72 hours. Studies/Results: No results found.  Scheduled: . atorvastatin  40 mg Oral Daily  . brimonidine  1 drop Both Eyes Daily  . feeding supplement (GLUCERNA SHAKE)  237 mL Oral TID WC  . feeding supplement (PRO-STAT SUGAR FREE 64)  30 mL Oral BID  . heparin  5,000 Units Subcutaneous Q8H  . insulin aspart  0-5 Units Subcutaneous QHS  . insulin aspart  0-9 Units Subcutaneous TID WC  . lactulose  10 g Oral TID  . lidocaine      . midodrine  15 mg Oral TID WC  . pantoprazole  40 mg  Oral BID  . rifaximin  550 mg Oral BID  . sucralfate  1 g Oral TID WC & HS      LOS: 2 days   Jeffery [name] C 12/29/2016,1:51 PM

## 2016-12-30 ENCOUNTER — Inpatient Hospital Stay (HOSPITAL_COMMUNITY): Payer: BC Managed Care – PPO | Admitting: Physical Therapy

## 2016-12-30 ENCOUNTER — Inpatient Hospital Stay (HOSPITAL_COMMUNITY): Payer: BC Managed Care – PPO

## 2016-12-30 DIAGNOSIS — N189 Chronic kidney disease, unspecified: Secondary | ICD-10-CM

## 2016-12-30 DIAGNOSIS — R188 Other ascites: Secondary | ICD-10-CM

## 2016-12-30 LAB — GLUCOSE, CAPILLARY
GLUCOSE-CAPILLARY: 109 mg/dL — AB (ref 65–99)
GLUCOSE-CAPILLARY: 184 mg/dL — AB (ref 65–99)
Glucose-Capillary: 104 mg/dL — ABNORMAL HIGH (ref 65–99)
Glucose-Capillary: 122 mg/dL — ABNORMAL HIGH (ref 65–99)

## 2016-12-30 NOTE — Plan of Care (Signed)
Problem: RH BLADDER ELIMINATION Goal: RH STG MANAGE BLADDER WITH ASSISTANCE STG Manage Bladder With mod Assistance  Outcome: Not Progressing Required I/O cath due to no void

## 2016-12-30 NOTE — Plan of Care (Signed)
Problem: RH BOWEL ELIMINATION Goal: RH STG MANAGE BOWEL WITH ASSISTANCE STG Manage Bowel with mod Assistance.  Outcome: Not Progressing Total assist with bedpan for bowel management  Problem: RH BLADDER ELIMINATION Goal: RH STG MANAGE BLADDER WITH ASSISTANCE STG Manage Bladder With mod Assistance  Outcome: Not Progressing Requires I/O cath due to urinary retention

## 2016-12-30 NOTE — Progress Notes (Signed)
Patient with one episode of N/V this shift.  Phenergan administered as ordered with relief.  Appetite poor.  Patient with generalized edema.  I/O cath performed x2 this shift due to inability to void.  Output at 2030= 350, and at 0630= 150.    Kelli Hope M

## 2016-12-30 NOTE — Progress Notes (Signed)
Occupational Therapy Session Note  Patient Details  Name: Jeffery [name] MRN: [id number] Date of Birth: 1951/05/09  Today's Date: 12/30/2016 OT Individual Time:  1620- 1625   30 min  Missed time: 45 min Missed time reason: Pt nauseas/fatigued and missed 45 min skilled OT.    Short Term Goals: Week 1:  OT Short Term Goal 1 (Week 1): Pt will complete slide board to [facility] with max A of 1. OT Short Term Goal 2 (Week 1): Pt will be able to wash LB with set up only from bed level. OT Short Term Goal 3 (Week 1): Pt will be able to don LB clothing with set up from bed level. OT Short Term Goal 4 (Week 1): Pt will demonstrate improved activity tolerance to participate for at least 10 continuous minutes without a rest break.   Skilled Therapeutic Interventions/Progress Updates:    1:1. No pain reported. Pt reporting feeling bloated, nauseas and constipated. Pt very fatigued per report from PT. Encouraged pt to participate in therapy and continue to try to eat/drink despited upset stomach. Educated pt on importance of nutrition to improve energy level required to participate in therapy. Pt requested to use bedpan d/t fatigue level/urgency and rolls B with MIN A and VC for technique. While on bedpan, pt becomes tearful saying, "My only hope is if my liver is failing that at some point soon this (life) will all end, but im not ready to go." Empathetic listening was used and OT provided support and encouragement. Pt agreeable to attempt to sit EOB and participate in therapy after finishing bowel movement. When OT returned, Pt rolls B as stated above and pt unable to cleanse bottom thoroughly, so OT provides A. Pt supine>EOB with MOD A for trunk facilitation and VC to push up onto forearm/wrist. Pt reports dizziness in sitting, but it subsides with time and deep breathing. Pt sits EOB with MIN A-supervision and reaches outside of BOS to obtain washcloth and wash UB/LB except buttocks with VC to lean laterally  to wash undersides of thighs. Pt fastens snaps to hospital gown and dons as if it was a T-shirt. Pt reports feeling better having been up EOB and says, "tomorrow I will make it into the chair." Exited session with pt supine in bed and call light within reach.  Therapy Documentation Precautions:  Precautions Precautions: Fall Restrictions Weight Bearing Restrictions: Yes RLE Weight Bearing: Non weight bearing LLE Weight Bearing: Non weight bearing Other Position/Activity Restrictions: NWBing LLE ADL: ADL ADL Comments: refer to functional navigator Exercises:   Other Treatments:    See Function Navigator for Current Functional Status.   Therapy/Group: Individual Therapy  [name] 12/30/2016, 4:16 PM

## 2016-12-30 NOTE — Progress Notes (Signed)
Subjective/Complaints: Had some n/v overnight. Received phenergan. Slow to arouse when I arrived this morning. States he feels better  ROS: Limited due to behavioral  Objective: Vital Signs: Blood pressure (!) 126/55, pulse 60, temperature 98 F (36.7 C), temperature source Oral, resp. rate 16, height 6' 1"  (1.854 m), weight 107 kg (236 lb), SpO2 99 %. Ir Paracentesis  Result Date: 12/29/2016 INDICATION: Patient with recurrent ascites. Request is made for diagnostic and therapeutic paracentesis. Patient has a 1 liter maximum. EXAM: ULTRASOUND GUIDED DIAGNOSTIC AND THERAPEUTIC PARACENTESIS MEDICATIONS: 10 mL 1% lidocaine COMPLICATIONS: None immediate. PROCEDURE: Informed written consent was obtained from the patient after a discussion of the risks, benefits and alternatives to treatment. A timeout was performed prior to the initiation of the procedure. Initial ultrasound scanning demonstrates a large amount of ascites within the left lateral abdomen. The left lateral abdomen was prepped and draped in the usual sterile fashion. 1% lidocaine was used for local anesthesia. Following this, a 19 gauge, 10-cm, Yueh catheter was introduced. An ultrasound image was saved for documentation purposes. The paracentesis was performed. The catheter was removed and a dressing was applied. The patient tolerated the procedure well without immediate post procedural complication. FINDINGS: A total of approximately 1.0 liters of clear, yellow fluid was removed. Samples were sent to the laboratory as requested by the clinical team. IMPRESSION: Successful ultrasound-guided paracentesis yielding 1.0 liters of peritoneal fluid. Read by:  Brynda Greathouse PA-C Electronically Signed   By: Sandi Mariscal M.D.   On: 12/29/2016 13:33   Results for orders placed or performed during the hospital encounter of 12/27/16 (from the past 72 hour(s))  Glucose, capillary     Status: Abnormal   Collection Time: 12/27/16  5:01 PM  Result  Value Ref Range   Glucose-Capillary 157 (H) 65 - 99 mg/dL   Comment 1 Notify RN   Glucose, capillary     Status: Abnormal   Collection Time: 12/27/16  9:03 PM  Result Value Ref Range   Glucose-Capillary 131 (H) 65 - 99 mg/dL   Comment 1 Notify RN   Glucose, capillary     Status: Abnormal   Collection Time: 12/28/16  6:26 AM  Result Value Ref Range   Glucose-Capillary 117 (H) 65 - 99 mg/dL   Comment 1 Notify RN   Glucose, capillary     Status: Abnormal   Collection Time: 12/28/16 12:15 PM  Result Value Ref Range   Glucose-Capillary 170 (H) 65 - 99 mg/dL  Glucose, capillary     Status: Abnormal   Collection Time: 12/28/16  4:54 PM  Result Value Ref Range   Glucose-Capillary 194 (H) 65 - 99 mg/dL  Glucose, capillary     Status: Abnormal   Collection Time: 12/28/16  8:53 PM  Result Value Ref Range   Glucose-Capillary 170 (H) 65 - 99 mg/dL  Glucose, capillary     Status: Abnormal   Collection Time: 12/29/16  6:33 AM  Result Value Ref Range   Glucose-Capillary 135 (H) 65 - 99 mg/dL  Renal function panel     Status: Abnormal   Collection Time: 12/29/16 10:32 AM  Result Value Ref Range   Sodium 130 (L) 135 - 145 mmol/L   Potassium 4.3 3.5 - 5.1 mmol/L   Chloride 103 101 - 111 mmol/L   CO2 19 (L) 22 - 32 mmol/L   Glucose, Bld 145 (H) 65 - 99 mg/dL   BUN 49 (H) 6 - 20 mg/dL   Creatinine, Ser 2.84 (H)  0.61 - 1.24 mg/dL   Calcium 8.2 (L) 8.9 - 10.3 mg/dL   Phosphorus 3.0 2.5 - 4.6 mg/dL   Albumin 2.0 (L) 3.5 - 5.0 g/dL   GFR calc non Af Amer 22 (L) >60 mL/min   GFR calc Af Amer 25 (L) >60 mL/min    Comment: (NOTE) The eGFR has been calculated using the CKD EPI equation. This calculation has not been validated in all clinical situations. eGFR's persistently <60 mL/min signify possible Chronic Kidney Disease.    Anion gap 8 5 - 15  Protime-INR     Status: Abnormal   Collection Time: 12/29/16 11:30 AM  Result Value Ref Range   Prothrombin Time 26.3 (H) 11.4 - 15.2 seconds    INR 2.36   APTT     Status: Abnormal   Collection Time: 12/29/16 11:30 AM  Result Value Ref Range   aPTT 113 (H) 24 - 36 seconds    Comment:        IF BASELINE aPTT IS ELEVATED, SUGGEST PATIENT RISK ASSESSMENT BE USED TO DETERMINE APPROPRIATE ANTICOAGULANT THERAPY.   CBC with Differential/Platelet     Status: Abnormal   Collection Time: 12/29/16 11:31 AM  Result Value Ref Range   WBC 5.0 4.0 - 10.5 K/uL   RBC 2.36 (L) 4.22 - 5.81 MIL/uL   Hemoglobin 7.7 (L) 13.0 - 17.0 g/dL   HCT 23.2 (L) 39.0 - 52.0 %   MCV 98.3 78.0 - 100.0 fL   MCH 32.6 26.0 - 34.0 pg   MCHC 33.2 30.0 - 36.0 g/dL   RDW 19.1 (H) 11.5 - 15.5 %   Platelets 68 (L) 150 - 400 K/uL    Comment: REPEATED TO VERIFY CONSISTENT WITH PREVIOUS RESULT    Neutrophils Relative % 52 %   Neutro Abs 2.6 1.7 - 7.7 K/uL   Lymphocytes Relative 24 %   Lymphs Abs 1.2 0.7 - 4.0 K/uL   Monocytes Relative 12 %   Monocytes Absolute 0.6 0.1 - 1.0 K/uL   Eosinophils Relative 12 %   Eosinophils Absolute 0.6 0.0 - 0.7 K/uL   Basophils Relative 0 %   Basophils Absolute 0.0 0.0 - 0.1 K/uL  Glucose, capillary     Status: Abnormal   Collection Time: 12/29/16 12:00 PM  Result Value Ref Range   Glucose-Capillary 124 (H) 65 - 99 mg/dL  Body fluid cell count with differential     Status: Abnormal   Collection Time: 12/29/16  1:26 PM  Result Value Ref Range   Fluid Type-FCT Peritoneal    Color, Fluid YELLOW YELLOW   Appearance, Fluid HAZY (A) CLEAR   WBC, Fluid 63 0 - 1,000 cu mm   Neutrophil Count, Fluid 23 0 - 25 %   Lymphs, Fluid 31 %   Monocyte-Macrophage-Serous Fluid 46 (L) 50 - 90 %  Albumin, pleural or peritoneal fluid     Status: None   Collection Time: 12/29/16  1:26 PM  Result Value Ref Range   Albumin, Fluid <1.0 g/dL    Comment: REPEATED TO VERIFY (NOTE) No normal range established for this test Results should be evaluated in conjunction with serum values    Fluid Type-FALB Peritoneal   Glucose, plural or peritoneal  fluid     Status: None   Collection Time: 12/29/16  1:26 PM  Result Value Ref Range   Glucose, Fluid 152 mg/dL    Comment: (NOTE) No normal range established for this test Results should be evaluated in conjunction with serum values  Fluid Type-FGLU Peritoneal   Gram stain     Status: None   Collection Time: 12/29/16  1:26 PM  Result Value Ref Range   Specimen Description FLUID    Special Requests PERITONEAL    Gram Stain NO WBC SEEN NO ORGANISMS SEEN     Report Status 12/29/2016 FINAL   Glucose, capillary     Status: Abnormal   Collection Time: 12/29/16  4:45 PM  Result Value Ref Range   Glucose-Capillary 112 (H) 65 - 99 mg/dL  Glucose, capillary     Status: Abnormal   Collection Time: 12/29/16  9:51 PM  Result Value Ref Range   Glucose-Capillary 113 (H) 65 - 99 mg/dL   Comment 1 Notify RN   Glucose, capillary     Status: Abnormal   Collection Time: 12/30/16  6:29 AM  Result Value Ref Range   Glucose-Capillary 104 (H) 65 - 99 mg/dL   Comment 1 Notify RN      HEENT: normal Cardio: RRR Resp: cta GI: BS positive and NT, ascites Extremity:  Pulses positive and 1+ Edema Skin:   Intact Neuro: Alert/Oriented and Abnormal Motor 4/5 BUE and bilateral HF Musc/Skel:  Other well healed R AKA, Left BKA with ACE wrap/wound healing Gen NAD   Assessment/Plan: 1. 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 Physiatrist is providing close team supervision and 24 hour management of active medical problems listed below. Physiatrist and rehab team continue to assess barriers to discharge/monitor patient progress toward functional and medical goals. FIM: Function - Bathing Position: Bed Body parts bathed by patient: Right arm, Left arm, Chest, Abdomen, Front perineal area, Buttocks, Left upper leg Body parts bathed by helper: Back Bathing not  applicable: Right upper leg, Right lower leg, Left lower leg  Function- Upper Body Dressing/Undressing What is the patient wearing?: Pull over shirt/dress Pull over shirt/dress - Perfomed by patient: Thread/unthread right sleeve, Thread/unthread left sleeve, Put head through opening, Pull shirt over trunk Assist Level: Set up, Supervision or verbal cues Set up : To obtain clothing/put away Function - Lower Body Dressing/Undressing What is the patient wearing?: Pants Position: Bed Pants- Performed by patient: Thread/unthread right pants leg, Thread/unthread left pants leg Pants- Performed by helper: Pull pants up/down        Function - Chair/bed transfer Chair/bed transfer method: Lateral scoot Chair/bed transfer assist level: 2 helpers Chair/bed transfer assistive device: Sliding board Chair/bed transfer details: Manual facilitation for weight shifting, Verbal cues for safe use of DME/AE, Verbal cues for precautions/safety, Verbal cues for technique, Tactile cues for posture, Manual facilitation for placement  Function - Locomotion: Wheelchair Will patient use wheelchair at discharge?: Yes Type: Manual Max wheelchair distance: 250 Assist Level: Supervision or verbal cues Assist Level: Supervision or verbal cues Assist Level: Supervision or verbal cues Turns around,maneuvers to table,bed, and toilet,negotiates 3% grade,maneuvers on rugs and over doorsills: No Function - Locomotion: Ambulation Ambulation activity did not occur: N/A  Function - Comprehension Comprehension: Auditory Comprehension assist level: Follows complex conversation/direction with no assist  Function - Expression Expression: Verbal Expression assist level: Expresses complex ideas: With no assist  Function - Social Interaction Social Interaction assist level: Interacts appropriately with others - No medications needed.  Function - Problem Solving Problem solving assist level: Solves complex problems:  Recognizes & self-corrects  Function - Memory Memory assist level: Complete Independence: No helper Patient normally  able to recall (first 3 days only): Current season, Location of own room, Staff names and faces, That he or she is in a hospital   Medical Problem List and Plan: 1.  Gait abnormality and limitation in self-care secondary to left BKA with history of right AKA. CIR PT, OT cont rehab 2.  DVT Prophylaxis/Anticoagulation: Pharmaceutical: Heparin 3. Pain Management: Controlled on tylenol prn 4. Mood: LCSW to follow for evaluation and support 5. Neuropsych: This patient is capable of making decisions on his own behalf. 6. Skin/Wound Care: Routine pressure relief measures.wound healing will be a challenge due to hypoalb 7. Fluids/Electrolytes/Nutrition: Strict I/O. 1200 cc FR daily, ensure supplement, appreciate dietary note 8. Acute on chronic renal insufficiency: Continue to monitor renal status. Strict I/O.  9. Hypotension: Monitor BP bid--proamatine tid to help with support.   -encourage fluids 10. Cirrhosis of liver with ascites: On Xifaxan bid and lactulose qid  -monitor for further nausea. Treat symptomatically for now 11. T2DM with neuropathy: Hgb A1c- 5.8-- Has been off meds for years. Monitor BS ac/hs.  CBG (last 3)   Recent Labs  12/29/16 1645 12/29/16 2151 12/30/16 0629  GLUCAP 112* 113* 104*    12. Hyponatremia: 130--recheck bmet on monday 13.  Chronic anemia- ? CKD related   LOS (Days) 3 A FACE TO FACE EVALUATION WAS PERFORMED  SWARTZ,ZACHARY T 12/30/2016, 7:44 AM

## 2016-12-30 NOTE — Progress Notes (Signed)
Physical Therapy Session Note  Patient Details  Name: Jeffery [name] MRN: [id number] Date of Birth: Sep 25, 1950  Today's Date: 12/30/2016  Short Term Goals: Week 1:  PT Short Term Goal 1 (Week 1): pt will move supine> sit with bedrails, to R, with supervision consistently PT Short Term Goal 2 (Week 1): pt will transfer using slide board with assist of 1 person PT Short Term Goal 3 (Week 1): pt will propel w/c up/down 3% grade ramp with supervison PT Short Term Goal 4 (Week 1): pt will participate in power w/c evaluation  Skilled Therapeutic Interventions/Progress Updates:  Treatment 1: Attempted to see pt for scheduled AM physical therapy session. Pt sound asleep in bed and barely able to open eyes for ~1 second despite max multimodal stimulation. Pt encouraged to awaken to participate in treatment. Pt reports "I can't, I had a bad night. My liver & kidneys are shutting down." Therapist notified RN of this who confirms that pt's reports are correct. Pt missed 60 minutes of skilled PT treatment 2/2 fatigue & unwillingness to participate.   Treatment 2: Pt received asleep in bed; attempted to wake patient with max multimodal stimulation but pt unable to open eyes. Pt able to verbally report "I'm very cold" and that he doesn't feel well. Encouraged pt to perform bed level exercises with pt reporting "It'll make me nauseous". Pt continued to lie in bed with eyes closed with minimal engagement with therapist. Pt missed 60 minutes of skilled PT 2/2 fatigue/ill/unwillingness to participate. Will f/u per POC.  Therapy Documentation Precautions:  Precautions Precautions: Fall Restrictions Weight Bearing Restrictions: Yes RLE Weight Bearing: Non weight bearing LLE Weight Bearing: Non weight bearing Other Position/Activity Restrictions: NWBing LLE  General: PT Amount of Missed Time (min): 60 Minutes + 60 minutes PT Missed Treatment Reason: Patient fatigue;Patient unwilling to participate; pt  ill   See Function Navigator for Current Functional Status.   Therapy/Group: Individual Therapy  Sandi Mariscal 12/30/2016, 2:09 PM

## 2016-12-30 NOTE — IPOC Note (Signed)
Overall Plan of Care [facility]) Patient Details Name: Jeffery [name] MRN: [id number] DOB: [birth year]  Admitting Diagnosis: N V AND SOB  Hospital Problems: Active Problems:   Unilateral complete BKA, left, initial encounter [facility])     Functional Problem List: Nursing    PT Balance, Edema, Endurance, Pain, Sensory  OT Balance, Endurance, Motor  SLP    TR         Basic ADL's: OT Bathing, Dressing, Toileting     Advanced  ADL's: OT Simple Meal Preparation, Light Housekeeping     Transfers: PT Bed Mobility, Bed to Chair, Car, Occupational psychologist, Research scientist (life sciences): PT Wheelchair Mobility     Additional Impairments: OT None  SLP        TR      Anticipated Outcomes Item Anticipated Outcome  Self Feeding    Swallowing      Basic self-care  mod I  Toileting  mod I   Bathroom Transfers mod I  Bowel/Bladder     Transfers  modifiied independent basic; supervision/set up car  Locomotion  modified independent manual w/c x 250' in controlled and 50' in home; supervison x 250' in community setting, including 3% ramp  Communication     Cognition     Pain     Safety/Judgment      Therapy Plan: PT Intensity: Minimum of 1-2 x/day ,45 to 90 minutes PT Frequency: 5 out of 7 days PT Duration Estimated Length of Stay: 21 OT Intensity: Minimum of 1-2 x/day, 45 to 90 minutes OT Frequency: 5 out of 7 days OT Duration/Estimated Length of Stay: 12-15 days         Team Interventions: Nursing Interventions    PT interventions Balance/vestibular training, Discharge planning, Community reintegration, DME/adaptive equipment instruction, Functional mobility training, Patient/family education, Pain management, Neuromuscular re-education, Psychosocial support, Splinting/orthotics, Therapeutic Exercise, Therapeutic Activities, UE/LE Strength taining/ROM, UE/LE Coordination activities, Wheelchair propulsion/positioning  OT Interventions Warden/ranger,  Discharge planning, DME/adaptive equipment instruction, Functional mobility training, Self Care/advanced ADL retraining, UE/LE Strength taining/ROM, Therapeutic Exercise, Therapeutic Activities, Patient/family education  SLP Interventions    TR Interventions    SW/CM Interventions Discharge Planning, Psychosocial Support, Patient/Family Education    Team Discharge Planning: Destination: PT-Home (pt wants to go home, not SNF) ,OT- Home , SLP-  Projected Follow-up: PT-Home health PT, OT-  Home health OT, SLP-  Projected Equipment Needs: PT-Wheelchair (measurements), Wheelchair cushion (measurements), To be determined, OT- To be determined, SLP-  Equipment Details: PT-pt does not want to use Advanced, but does not have a local vendor yet; he needs power w/c eval; repair or new cushion for manual w/c; amputee pad L for manual w/c.  manual w/c and cushion  will be 66 years old 5/18; he purchased online, OT-  Patient/family involved in discharge planning: PT- Patient,  OT-Patient, SLP-   MD ELOS: 15-18 days Medical Rehab Prognosis:  Good Assessment: The patient has been admitted for CIR therapies with the diagnosis of left BKA. The team will be addressing functional mobility, strength, stamina, balance, safety, adaptive techniques and equipment, self-care, bowel and bladder mgt, patient and caregiver education, stump and wound mgt, ego support, pain control. Goals have been set at mod I for basic mobility and self-care.    Ranelle Oyster, MD, FAAPMR      See Team Conference Notes for weekly updates to the plan of care

## 2016-12-31 ENCOUNTER — Inpatient Hospital Stay (HOSPITAL_COMMUNITY): Payer: BC Managed Care – PPO

## 2016-12-31 DIAGNOSIS — N170 Acute kidney failure with tubular necrosis: Secondary | ICD-10-CM

## 2016-12-31 LAB — RENAL FUNCTION PANEL
Albumin: 1.8 g/dL — ABNORMAL LOW (ref 3.5–5.0)
Anion gap: 6 (ref 5–15)
BUN: 56 mg/dL — AB (ref 6–20)
CHLORIDE: 103 mmol/L (ref 101–111)
CO2: 23 mmol/L (ref 22–32)
Calcium: 8.3 mg/dL — ABNORMAL LOW (ref 8.9–10.3)
Creatinine, Ser: 3.23 mg/dL — ABNORMAL HIGH (ref 0.61–1.24)
GFR calc Af Amer: 21 mL/min — ABNORMAL LOW (ref >60)
GFR, EST NON AFRICAN AMERICAN: 19 mL/min — AB (ref >60)
Glucose, Bld: 137 mg/dL — ABNORMAL HIGH (ref 65–99)
POTASSIUM: 4.6 mmol/L (ref 3.5–5.1)
Phosphorus: 3.7 mg/dL (ref 2.5–4.6)
Sodium: 132 mmol/L — ABNORMAL LOW (ref 135–145)

## 2016-12-31 LAB — GLUCOSE, CAPILLARY
GLUCOSE-CAPILLARY: 133 mg/dL — AB (ref 65–99)
GLUCOSE-CAPILLARY: 163 mg/dL — AB (ref 65–99)
GLUCOSE-CAPILLARY: 134 mg/dL — AB (ref 65–99)
GLUCOSE-CAPILLARY: 122 mg/dL — AB (ref 65–99)
Glucose-Capillary: 115 mg/dL — ABNORMAL HIGH (ref 65–99)

## 2016-12-31 LAB — CBC
HEMATOCRIT: 21.4 % — AB (ref 39.0–52.0)
Hemoglobin: 7.2 g/dL — ABNORMAL LOW (ref 13.0–17.0)
MCH: 32.4 pg (ref 26.0–34.0)
MCHC: 33.6 g/dL (ref 30.0–36.0)
MCV: 96.4 fL (ref 78.0–100.0)
Platelets: 54 10*3/uL — ABNORMAL LOW (ref 150–400)
RBC: 2.22 MIL/uL — AB (ref 4.22–5.81)
RDW: 19.1 % — AB (ref 11.5–15.5)
WBC: 3.9 10*3/uL — AB (ref 4.0–10.5)

## 2016-12-31 LAB — PREPARE RBC (CROSSMATCH)

## 2016-12-31 LAB — URINALYSIS, ROUTINE W REFLEX MICROSCOPIC
BACTERIA UA: NONE SEEN
BILIRUBIN URINE: NEGATIVE
GLUCOSE, UA: NEGATIVE mg/dL
KETONES UR: NEGATIVE mg/dL
Leukocytes, UA: NEGATIVE
Nitrite: NEGATIVE
PH: 5 (ref 5.0–8.0)
PROTEIN: NEGATIVE mg/dL
Specific Gravity, Urine: 1.018 (ref 1.005–1.030)

## 2016-12-31 LAB — OCCULT BLOOD X 1 CARD TO LAB, STOOL: Fecal Occult Bld: POSITIVE — AB

## 2016-12-31 MED ORDER — SODIUM CHLORIDE 0.9 % IV SOLN
INTRAVENOUS | Status: DC
  Administered 2016-12-31 – 2017-01-02 (×3): via INTRAVENOUS

## 2016-12-31 MED ORDER — ACETAMINOPHEN 325 MG PO TABS
650.0000 mg | ORAL_TABLET | Freq: Once | ORAL | Status: AC
  Administered 2016-12-31: 650 mg via ORAL
  Filled 2016-12-31: qty 2

## 2016-12-31 MED ORDER — PROMETHAZINE HCL 12.5 MG RE SUPP
6.2500 mg | Freq: Four times a day (QID) | RECTAL | Status: DC | PRN
  Filled 2016-12-31: qty 1

## 2016-12-31 MED ORDER — SODIUM CHLORIDE 0.9 % IV SOLN
Freq: Once | INTRAVENOUS | Status: AC
  Administered 2016-12-31: 09:00:00 via INTRAVENOUS

## 2016-12-31 MED ORDER — DIPHENHYDRAMINE HCL 25 MG PO CAPS
25.0000 mg | ORAL_CAPSULE | Freq: Once | ORAL | Status: AC
  Administered 2016-12-31: 25 mg via ORAL
  Filled 2016-12-31: qty 1

## 2016-12-31 MED ORDER — PROMETHAZINE HCL 25 MG/ML IJ SOLN
6.2500 mg | Freq: Four times a day (QID) | INTRAMUSCULAR | Status: DC | PRN
  Administered 2017-01-01: 6.25 mg via INTRAMUSCULAR
  Filled 2016-12-31 (×2): qty 1

## 2016-12-31 MED ORDER — PROMETHAZINE HCL 12.5 MG PO TABS
6.2500 mg | ORAL_TABLET | Freq: Four times a day (QID) | ORAL | Status: DC | PRN
  Administered 2016-12-31: 6.25 mg via ORAL
  Filled 2016-12-31: qty 1

## 2016-12-31 NOTE — Progress Notes (Signed)
Occupational Therapy Session Note  Patient Details  Name: Jeffery [name] MRN: [id number] Date of Birth: Jan 24, 1951  Today's Date: 12/31/2016 OT Individual Time: 1715-1745 OT Individual Time Calculation (min): 30 min    Short Term Goals: Week 1:  OT Short Term Goal 1 (Week 1): Pt will complete slide board to [facility] with max A of 1. OT Short Term Goal 2 (Week 1): Pt will be able to wash LB with set up only from bed level. OT Short Term Goal 3 (Week 1): Pt will be able to don LB clothing with set up from bed level. OT Short Term Goal 4 (Week 1): Pt will demonstrate improved activity tolerance to participate for at least 10 continuous minutes without a rest break.   Skilled Therapeutic Interventions/Progress Updates:    1:1. Focus of session on therapy participation, grooming, and lateral scooting in prep for transfers. +2 not available during session therefore transfer not attempted d/t safety concerns. Pt supine>sitting EOB with MOD A for trunk facilitation and increased time. Pt reports feeling nauseas throughout session and requires frequent rest. Educated pt on head hip relationship needed for slide beard transfers to be utilized to scoot laterally along EOB.. Pt scoots 5 times using B UE with MAX A from OT facilitating bed sheet under client to assist slide. Pt requires VC for trunk flexion/ head hip relationship. Pt reports increased nausea with activity and activity is terminated. Pt sits EOB to brush teeth, wash face and wash UB. Pt declines changing gown. During rest breaks pt says, "I feel like this (CIR stay) is a waste of resources if I am going to die of liver failure in a year." Discussed pt discharge plans, goal of returning to work, and quality of life. Exited session with pt seated EOB with laptop open, call light in reach, bed exit alarm on and visitor in room.   Therapy Documentation Precautions:  Precautions Precautions: Fall Restrictions Weight Bearing Restrictions: Yes RLE  Weight Bearing: Non weight bearing LLE Weight Bearing: Non weight bearing Other Position/Activity Restrictions: NWBing LLE General:   Vital Signs:   See Function Navigator for Current Functional Status.   Therapy/Group: Individual Therapy  [name] 12/31/2016, 7:25 PM

## 2016-12-31 NOTE — Progress Notes (Addendum)
Subjective/Complaints: No nausea this am. Still having nausea intermittently per RN. Pt states that when he eats "solids" he doesn't do well. Able to sleep last night. Pain under reasonable control  ROS: pt denies nausea, vomiting, diarrhea, cough, shortness of breath or chest pain  Objective: Vital Signs: Blood pressure 112/63, pulse 68, temperature 98.3 F (36.8 C), temperature source Oral, resp. rate 18, height _0  (1.854 m), weight 107.2 kg (236 lb 3.9 oz), SpO2 100 %. Ir Paracentesis  Result Date: 12/29/2016 INDICATION: Patient with recurrent ascites. Request is made for diagnostic and therapeutic paracentesis. Patient has a 1 liter maximum. EXAM: ULTRASOUND GUIDED DIAGNOSTIC AND THERAPEUTIC PARACENTESIS MEDICATIONS: 10 mL 1% lidocaine COMPLICATIONS: None immediate. PROCEDURE: Informed written consent was obtained from the patient after a discussion of the risks, benefits and alternatives to treatment. A timeout was performed prior to the initiation of the procedure. Initial ultrasound scanning demonstrates a large amount of ascites within the left lateral abdomen. The left lateral abdomen was prepped and draped in the usual sterile fashion. 1% lidocaine was used for local anesthesia. Following this, a 19 gauge, 10-cm, Yueh catheter was introduced. An ultrasound image was saved for documentation purposes. The paracentesis was performed. The catheter was removed and a dressing was applied. The patient tolerated the procedure well without immediate post procedural complication. FINDINGS: A total of approximately 1.0 liters of clear, yellow fluid was removed. Samples were sent to the laboratory as requested by the clinical team. IMPRESSION: Successful ultrasound-guided paracentesis yielding 1.0 liters of peritoneal fluid. Read by:  Brynda Greathouse PA-C Electronically Signed   By: Sandi Mariscal M.D.   On: 12/29/2016 13:33   Results for orders placed or performed during the hospital encounter of  12/27/16 (from the past 72 hour(s))  Glucose, capillary     Status: Abnormal   Collection Time: 12/28/16 12:15 PM  Result Value Ref Range   Glucose-Capillary 170 (H) 65 - 99 mg/dL  Glucose, capillary     Status: Abnormal   Collection Time: 12/28/16  4:54 PM  Result Value Ref Range   Glucose-Capillary 194 (H) 65 - 99 mg/dL  Glucose, capillary     Status: Abnormal   Collection Time: 12/28/16  8:53 PM  Result Value Ref Range   Glucose-Capillary 170 (H) 65 - 99 mg/dL  Glucose, capillary     Status: Abnormal   Collection Time: 12/29/16  6:33 AM  Result Value Ref Range   Glucose-Capillary 135 (H) 65 - 99 mg/dL  Renal function panel     Status: Abnormal   Collection Time: 12/29/16 10:32 AM  Result Value Ref Range   Sodium 130 (L) 135 - 145 mmol/L   Potassium 4.3 3.5 - 5.1 mmol/L   Chloride 103 101 - 111 mmol/L   CO2 19 (L) 22 - 32 mmol/L   Glucose, Bld 145 (H) 65 - 99 mg/dL   BUN 49 (H) 6 - 20 mg/dL   Creatinine, Ser 2.84 (H) 0.61 - 1.24 mg/dL   Calcium 8.2 (L) 8.9 - 10.3 mg/dL   Phosphorus 3.0 2.5 - 4.6 mg/dL   Albumin 2.0 (L) 3.5 - 5.0 g/dL   GFR calc non Af Amer 22 (L) >60 mL/min   GFR calc Af Amer 25 (L) >60 mL/min    Comment: (NOTE) The eGFR has been calculated using the CKD EPI equation. This calculation has not been validated in all clinical situations. eGFR's persistently <60 mL/min signify possible Chronic Kidney Disease.    Anion gap 8 5 -  15  Protime-INR     Status: Abnormal   Collection Time: 12/29/16 11:30 AM  Result Value Ref Range   Prothrombin Time 26.3 (H) 11.4 - 15.2 seconds   INR 2.36   APTT     Status: Abnormal   Collection Time: 12/29/16 11:30 AM  Result Value Ref Range   aPTT 113 (H) 24 - 36 seconds    Comment:        IF BASELINE aPTT IS ELEVATED, SUGGEST PATIENT RISK ASSESSMENT BE USED TO DETERMINE APPROPRIATE ANTICOAGULANT THERAPY.   CBC with Differential/Platelet     Status: Abnormal   Collection Time: 12/29/16 11:31 AM  Result Value Ref Range    WBC 5.0 4.0 - 10.5 K/uL   RBC 2.36 (L) 4.22 - 5.81 MIL/uL   Hemoglobin 7.7 (L) 13.0 - 17.0 g/dL   HCT 23.2 (L) 39.0 - 52.0 %   MCV 98.3 78.0 - 100.0 fL   MCH 32.6 26.0 - 34.0 pg   MCHC 33.2 30.0 - 36.0 g/dL   RDW 19.1 (H) 11.5 - 15.5 %   Platelets 68 (L) 150 - 400 K/uL    Comment: REPEATED TO VERIFY CONSISTENT WITH PREVIOUS RESULT    Neutrophils Relative % 52 %   Neutro Abs 2.6 1.7 - 7.7 K/uL   Lymphocytes Relative 24 %   Lymphs Abs 1.2 0.7 - 4.0 K/uL   Monocytes Relative 12 %   Monocytes Absolute 0.6 0.1 - 1.0 K/uL   Eosinophils Relative 12 %   Eosinophils Absolute 0.6 0.0 - 0.7 K/uL   Basophils Relative 0 %   Basophils Absolute 0.0 0.0 - 0.1 K/uL  Glucose, capillary     Status: Abnormal   Collection Time: 12/29/16 12:00 PM  Result Value Ref Range   Glucose-Capillary 124 (H) 65 - 99 mg/dL  Body fluid cell count with differential     Status: Abnormal   Collection Time: 12/29/16  1:26 PM  Result Value Ref Range   Fluid Type-FCT Peritoneal    Color, Fluid YELLOW YELLOW   Appearance, Fluid HAZY (A) CLEAR   WBC, Fluid 63 0 - 1,000 cu mm   Neutrophil Count, Fluid 23 0 - 25 %   Lymphs, Fluid 31 %   Monocyte-Macrophage-Serous Fluid 46 (L) 50 - 90 %  Albumin, pleural or peritoneal fluid     Status: None   Collection Time: 12/29/16  1:26 PM  Result Value Ref Range   Albumin, Fluid <1.0 g/dL    Comment: REPEATED TO VERIFY (NOTE) No normal range established for this test Results should be evaluated in conjunction with serum values    Fluid Type-FALB Peritoneal   Glucose, plural or peritoneal fluid     Status: None   Collection Time: 12/29/16  1:26 PM  Result Value Ref Range   Glucose, Fluid 152 mg/dL    Comment: (NOTE) No normal range established for this test Results should be evaluated in conjunction with serum values    Fluid Type-FGLU Peritoneal   Culture, body fluid-bottle     Status: None (Preliminary result)   Collection Time: 12/29/16  1:26 PM  Result Value  Ref Range   Specimen Description FLUID    Special Requests PERITONEAL    Culture NO GROWTH < 24 HOURS    Report Status PENDING   Gram stain     Status: None   Collection Time: 12/29/16  1:26 PM  Result Value Ref Range   Specimen Description FLUID    Special Requests PERITONEAL  Gram Stain NO WBC SEEN NO ORGANISMS SEEN     Report Status 12/29/2016 FINAL   Glucose, capillary     Status: Abnormal   Collection Time: 12/29/16  4:45 PM  Result Value Ref Range   Glucose-Capillary 112 (H) 65 - 99 mg/dL  Glucose, capillary     Status: Abnormal   Collection Time: 12/29/16  9:51 PM  Result Value Ref Range   Glucose-Capillary 113 (H) 65 - 99 mg/dL   Comment 1 Notify RN   Glucose, capillary     Status: Abnormal   Collection Time: 12/30/16  6:29 AM  Result Value Ref Range   Glucose-Capillary 104 (H) 65 - 99 mg/dL   Comment 1 Notify RN   Glucose, capillary     Status: Abnormal   Collection Time: 12/30/16 11:37 AM  Result Value Ref Range   Glucose-Capillary 109 (H) 65 - 99 mg/dL  Glucose, capillary     Status: Abnormal   Collection Time: 12/30/16  4:34 PM  Result Value Ref Range   Glucose-Capillary 122 (H) 65 - 99 mg/dL  Glucose, capillary     Status: Abnormal   Collection Time: 12/30/16  9:11 PM  Result Value Ref Range   Glucose-Capillary 184 (H) 65 - 99 mg/dL  Renal function panel     Status: Abnormal   Collection Time: 12/31/16  5:03 AM  Result Value Ref Range   Sodium 132 (L) 135 - 145 mmol/L   Potassium 4.6 3.5 - 5.1 mmol/L   Chloride 103 101 - 111 mmol/L   CO2 23 22 - 32 mmol/L   Glucose, Bld 137 (H) 65 - 99 mg/dL   BUN 56 (H) 6 - 20 mg/dL   Creatinine, Ser 3.23 (H) 0.61 - 1.24 mg/dL   Calcium 8.3 (L) 8.9 - 10.3 mg/dL   Phosphorus 3.7 2.5 - 4.6 mg/dL   Albumin 1.8 (L) 3.5 - 5.0 g/dL   GFR calc non Af Amer 19 (L) >60 mL/min   GFR calc Af Amer 21 (L) >60 mL/min    Comment: (NOTE) The eGFR has been calculated using the CKD EPI equation. This calculation has not been  validated in all clinical situations. eGFR's persistently <60 mL/min signify possible Chronic Kidney Disease.    Anion gap 6 5 - 15  CBC     Status: Abnormal   Collection Time: 12/31/16  5:03 AM  Result Value Ref Range   WBC 3.9 (L) 4.0 - 10.5 K/uL   RBC 2.22 (L) 4.22 - 5.81 MIL/uL   Hemoglobin 7.2 (L) 13.0 - 17.0 g/dL   HCT 21.4 (L) 39.0 - 52.0 %   MCV 96.4 78.0 - 100.0 fL   MCH 32.4 26.0 - 34.0 pg   MCHC 33.6 30.0 - 36.0 g/dL   RDW 19.1 (H) 11.5 - 15.5 %   Platelets 54 (L) 150 - 400 K/uL    Comment: CONSISTENT WITH PREVIOUS RESULT  Glucose, capillary     Status: Abnormal   Collection Time: 12/31/16  6:39 AM  Result Value Ref Range   Glucose-Capillary 115 (H) 65 - 99 mg/dL     HEENT: normal Cardio: RRR Resp: CTA GI: BS positive and NT, + ascites Extremity:  Pulses positive and 1+ Edema Skin:   Intact Neuro: very alert/Oriented and Abnormal Motor 4/5 BUE and bilateral HF Musc/Skel:  Other well healed R AKA, Left BKA with ACE wrap/wound healing Gen NAD   Assessment/Plan: 1. 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 Physiatrist is providing close team supervision and 24 hour management of active medical problems listed below. Physiatrist and rehab team continue to assess barriers to discharge/monitor patient progress toward functional and medical goals. FIM: Function - Bathing Position: Bed Body parts bathed by patient: Right arm, Left arm, Chest, Abdomen, Front perineal area, Left upper leg Body parts bathed by helper: Back, Buttocks Bathing not applicable: Right upper leg, Right lower leg, Left lower leg  Function- Upper Body Dressing/Undressing What is the patient wearing?: Hospital gown Pull over shirt/dress - Perfomed by patient: Thread/unthread right sleeve, Thread/unthread left sleeve, Put head through opening, Pull shirt over  trunk Assist Level: Set up, Supervision or verbal cues Set up : To obtain clothing/put away Function - Lower Body Dressing/Undressing What is the patient wearing?: Pants Position: Bed Pants- Performed by patient: Thread/unthread right pants leg, Thread/unthread left pants leg Pants- Performed by helper: Pull pants up/down  Function - Toileting Toileting activity did not occur: Safety/medical concerns  Function - Air cabin crew transfer activity did not occur: Safety/medical concerns  Function - Chair/bed transfer Chair/bed transfer method: Lateral scoot Chair/bed transfer assist level: 2 helpers Chair/bed transfer assistive device: Sliding board Chair/bed transfer details: Manual facilitation for weight shifting, Verbal cues for safe use of DME/AE, Verbal cues for precautions/safety, Verbal cues for technique, Tactile cues for posture, Manual facilitation for placement  Function - Locomotion: Wheelchair Will patient use wheelchair at discharge?: Yes Type: Manual Max wheelchair distance: 250 Assist Level: Supervision or verbal cues Assist Level: Supervision or verbal cues Assist Level: Supervision or verbal cues Turns around,maneuvers to table,bed, and toilet,negotiates 3% grade,maneuvers on rugs and over doorsills: No Function - Locomotion: Ambulation Ambulation activity did not occur: N/A  Function - Comprehension Comprehension: Auditory Comprehension assist level: Follows complex conversation/direction with no assist  Function - Expression Expression: Verbal Expression assist level: Expresses complex ideas: With no assist  Function - Social Interaction Social Interaction assist level: Interacts appropriately with others - No medications needed.  Function - Problem Solving Problem solving assist level: Solves complex problems: Recognizes & self-corrects  Function - Memory Memory assist level: Complete Independence: No helper Patient normally able to recall  (first 3 days only): Current season, Location of own room, Staff names and faces, That he or she is in a hospital   Medical Problem List and Plan: 1.  Gait abnormality and limitation in self-care secondary to left BKA with history of right AKA. CIR PT, OT cont rehab 2.  DVT Prophylaxis/Anticoagulation: Pharmaceutical: Heparin 3. Pain Management: Controlled on tylenol prn 4. Mood: LCSW to follow for evaluation and support 5. Neuropsych: This patient is capable of making decisions on his own behalf. 6. Skin/Wound Care: Routine pressure relief measures.wound healing will be a challenge due to hypoalb 7. Fluids/Electrolytes/Nutrition: Strict I/O. 1200 cc FR daily, ensure supplement, appreciate dietary note 8. Acute on chronic renal insufficiency: poor po intake,  poor urine output (300cc?), weight stable. Cr up to 3.23 today   -begin low rate IVF for now  -check PVR's, UA/UCX  -further mgt per renal   -RD consult 9. Hypotension: Monitor BP bid--proamatine tid to help with support.   -encourage fluids  -IVF as above 10. Cirrhosis of liver with ascites: On Xifaxan bid and lactulose qid  -intermittent nausea   -is afraid to eat "solids"  -RD consult 11. T2DM with neuropathy: Hgb A1c- 5.8-- Has been off meds for years.  Monitor BS ac/hs.   -reasonable control but not eating CBG (last 3)   Recent Labs  12/30/16 1634 12/30/16 2111 12/31/16 0639  GLUCAP 122* 184* 115*    12. Hyponatremia: 132 today 13.  Acute on Chronic anemia- post -op/chronic kidney disease  -transfuse 1 unit PRBC today  -wound stable  -check stool for OB      LOS (Days) 4 A FACE TO FACE EVALUATION WAS PERFORMED  SWARTZ,ZACHARY T 12/31/2016, 7:05 AM

## 2016-12-31 NOTE — Progress Notes (Signed)
Assessment/Plan: 1 AKI /CKD3 Exacerbation prob due to ABLA and unstable hemodynamics  Getting slow IVF  2 Cirrhosis w/ ascites 3 DM 4 PVD post new L BKA  5 Abd pain, improved-no SBP 6 Anemia, worse.Marland KitchenMarland KitchenGot PRBCs  Subjective: Interval History: abd better  Objective: Vital signs in last 24 hours: Temp:  [97.8 F (36.6 C)-98.4 F (36.9 C)] 98.4 F (36.9 C) (04/15 1151) Pulse Rate:  [57-88] 57 (04/15 1151) Resp:  [18-20] 20 (04/15 1151) BP: (112-119)/(56-69) 119/57 (04/15 1151) SpO2:  [99 %-100 %] 99 % (04/15 1151) Weight:  [107.2 kg (236 lb 3.9 oz)] 107.2 kg (236 lb 3.9 oz) (04/15 0421) Weight change: 0.11 kg (3.9 oz)  Intake/Output from previous day: 04/14 0701 - 04/15 0700 In: 540 [P.O.:540] Out: 300 [Urine:300] Intake/Output this shift: Total I/O In: 795 [P.O.:360; I.V.:50; Blood:385] Out: 245 [Urine:220; Stool:25]  General appearance: alert and cooperative GI: soft, non-tender; bowel sounds normal; no masses,  no organomegaly and large ascites  No asterixis Bilat amp;R AKA, L BKA wrapped  Lab Results:  Recent Labs  12/29/16 1131 12/31/16 0503  WBC 5.0 3.9*  HGB 7.7* 7.2*  HCT 23.2* 21.4*  PLT 68* 54*   BMET:  Recent Labs  12/29/16 1032 12/31/16 0503  NA 130* 132*  K 4.3 4.6  CL 103 103  CO2 19* 23  GLUCOSE 145* 137*  BUN 49* 56*  CREATININE 2.84* 3.23*  CALCIUM 8.2* 8.3*   No results for input(s): PTH in the last 72 hours. Iron Studies: No results for input(s): IRON, TIBC, TRANSFERRIN, FERRITIN in the last 72 hours. Studies/Results: No results found.  Scheduled: . atorvastatin  40 mg Oral Daily  . brimonidine  1 drop Both Eyes Daily  . feeding supplement (GLUCERNA SHAKE)  237 mL Oral TID WC  . feeding supplement (PRO-STAT SUGAR FREE 64)  30 mL Oral BID  . heparin  5,000 Units Subcutaneous Q8H  . insulin aspart  0-5 Units Subcutaneous QHS  . insulin aspart  0-9 Units Subcutaneous TID WC  . lactulose  10 g Oral TID  . midodrine  15 mg Oral  TID WC  . pantoprazole  40 mg Oral BID  . rifaximin  550 mg Oral BID  . sucralfate  1 g Oral TID WC & HS    LOS: 4 days   Raydel Hosick C 12/31/2016,5:40 PM

## 2016-12-31 NOTE — Progress Notes (Signed)
Pt asked if everything was ok with transfusion? VSS, CBG WNL, no c/o just "making sure before he takes a nap". Tol infusion well. Tyl and benadryl administered prior to transfusion start per order and pt drank nutritional supplement. Pamelia Hoit

## 2017-01-01 ENCOUNTER — Inpatient Hospital Stay (HOSPITAL_COMMUNITY): Payer: BC Managed Care – PPO | Admitting: Physical Therapy

## 2017-01-01 ENCOUNTER — Telehealth (INDEPENDENT_AMBULATORY_CARE_PROVIDER_SITE_OTHER): Payer: Self-pay | Admitting: *Deleted

## 2017-01-01 ENCOUNTER — Inpatient Hospital Stay (HOSPITAL_COMMUNITY): Payer: BC Managed Care – PPO | Admitting: Occupational Therapy

## 2017-01-01 ENCOUNTER — Inpatient Hospital Stay (HOSPITAL_COMMUNITY): Payer: BC Managed Care – PPO

## 2017-01-01 DIAGNOSIS — D62 Acute posthemorrhagic anemia: Secondary | ICD-10-CM

## 2017-01-01 DIAGNOSIS — D689 Coagulation defect, unspecified: Secondary | ICD-10-CM

## 2017-01-01 DIAGNOSIS — R52 Pain, unspecified: Secondary | ICD-10-CM

## 2017-01-01 DIAGNOSIS — K7460 Unspecified cirrhosis of liver: Secondary | ICD-10-CM

## 2017-01-01 LAB — TYPE AND SCREEN
ABO/RH(D): A POS
ANTIBODY SCREEN: NEGATIVE
UNIT DIVISION: 0

## 2017-01-01 LAB — BPAM RBC
Blood Product Expiration Date: 201804222359
ISSUE DATE / TIME: 201804150917
Unit Type and Rh: 6200

## 2017-01-01 LAB — URINE CULTURE: CULTURE: NO GROWTH

## 2017-01-01 LAB — BASIC METABOLIC PANEL
ANION GAP: 6 (ref 5–15)
BUN: 54 mg/dL — AB (ref 6–20)
CALCIUM: 8.1 mg/dL — AB (ref 8.9–10.3)
CO2: 22 mmol/L (ref 22–32)
Chloride: 104 mmol/L (ref 101–111)
Creatinine, Ser: 3.35 mg/dL — ABNORMAL HIGH (ref 0.61–1.24)
GFR calc Af Amer: 21 mL/min — ABNORMAL LOW (ref >60)
GFR, EST NON AFRICAN AMERICAN: 18 mL/min — AB (ref >60)
GLUCOSE: 117 mg/dL — AB (ref 65–99)
Potassium: 4.4 mmol/L (ref 3.5–5.1)
SODIUM: 132 mmol/L — AB (ref 135–145)

## 2017-01-01 LAB — GLUCOSE, CAPILLARY
GLUCOSE-CAPILLARY: 103 mg/dL — AB (ref 65–99)
Glucose-Capillary: 116 mg/dL — ABNORMAL HIGH (ref 65–99)
Glucose-Capillary: 108 mg/dL — ABNORMAL HIGH (ref 65–99)

## 2017-01-01 LAB — APTT: aPTT: 83 seconds — ABNORMAL HIGH (ref 24–36)

## 2017-01-01 LAB — PROTIME-INR
INR: 2.21
Prothrombin Time: 24.9 seconds — ABNORMAL HIGH (ref 11.4–15.2)

## 2017-01-01 MED ORDER — VITAMIN K1 10 MG/ML IJ SOLN
10.0000 mg | Freq: Every day | INTRAVENOUS | Status: AC
  Administered 2017-01-01 – 2017-01-03 (×3): 10 mg via INTRAVENOUS
  Filled 2017-01-01 (×3): qty 1

## 2017-01-01 MED ORDER — ENSURE ENLIVE PO LIQD
237.0000 mL | Freq: Three times a day (TID) | ORAL | Status: DC
  Administered 2017-01-01 – 2017-01-02 (×5): 237 mL via ORAL

## 2017-01-01 NOTE — Consult Note (Signed)
WOC Nurse wound consult note Reason for Consult: Consult requested for buttocks and sacrum.  Pt has bilat amputations and it is difficult to reposition himself without creating shear.  He has been using a sliding board with therapy to get in and out of bed, and is having frequent diarrhea stools. Wound type: Sacrum and top of gluteal cleft with 4X4cm red, macerated skin, patchy area of partial thickness skin loss, appearance consistent with moisture associated skin damage. Left lower buttock with shear injury; irregular in shape, partial thickness skin loss, 1X1X.1cm, red and dry Right  lower buttock with shear injury; irregular in shape, partial thickness skin loss, 2X2X.1cm, red and dry Pressure Injury POA: These are NOT pressure injuries Dressing procedure/placement/frequency: Foam dressing to right buttock to protect from further injury.  Air mattress for pressure reduction.  Barrier cream to buttocks /sacrum/gluteal fold to promote healing and repel moisture.  Discussed plan of care with patient and PA at the bedside, pt verbalized understanding. Please re-consult if further assistance is needed.  Thank-you,  Cammie Mcgee MSN, RN, CWOCN, [place], CNS [phone]

## 2017-01-01 NOTE — Progress Notes (Signed)
  [facility] Progress Note    Assessment/ Plan:   1 AKI /CKD3 Exacerbation prob due to ABLA and unstable hemodynamics.  Creatinine seems to be plateauing and hopefully will downtrend soon 2 Cirrhosis w/ ascites 3 DM 4 PVD post new L BKA  5 Abd pain, improved-no SBP 6 Anemia, s/p 1u pRBCs.  Needs post-transfusion CBC- will order.  Subjective:    Doing PT this AM.  Feeling better.     Objective:   BP (!) 134/48 (BP Location: Left Arm)   Pulse 83   Temp 97.6 F (36.4 C) (Oral)   Resp 19   Ht  (1.854 m)   Wt 107.3 kg (236 lb 7.8 oz)   SpO2 100%   BMI 31.20 kg/m   Intake/Output Summary (Last 24 hours) at 01/01/17 1405 Last data filed at 01/01/17 1610  Gross per 24 hour  Intake                0 ml  Output              300 ml  Net             -300 ml   Weight change: 0.11 kg (3.9 oz)  Physical Exam: General appearance: alert and cooperative HEENT: EOMI, no JVD GI: soft, non-tender; bowel sounds normal; no masses,  no organomegaly and large ascites  No asterixis Bilat amp;R AKA, L BKA wrapped   Imaging: No results found.  Labs: BMET  Recent Labs Lab 12/26/16 0622 12/27/16 0420 12/29/16 1032 12/31/16 0503 01/01/17 0256  NA 135 134* 130* 132* 132*  K 4.6 4.4 4.3 4.6 4.4  CL 107 106 103 103 104  CO2 21* 24 19* 23 22  GLUCOSE 97 177* 145* 137* 117*  BUN 50* 51* 49* 56* 54*  CREATININE 2.74* 2.84* 2.84* 3.23* 3.35*  CALCIUM 8.3* 8.3* 8.2* 8.3* 8.1*  PHOS 3.7 3.0 3.0 3.7  --    CBC  Recent Labs Lab 12/29/16 1131 12/31/16 0503  WBC 5.0 3.9*  NEUTROABS 2.6  --   HGB 7.7* 7.2*  HCT 23.2* 21.4*  MCV 98.3 96.4  PLT 68* 54*    Medications:    . brimonidine  1 drop Both Eyes Daily  . feeding supplement (GLUCERNA SHAKE)  237 mL Oral TID WC  . feeding supplement (PRO-STAT SUGAR FREE 64)  30 mL Oral BID  . insulin aspart  0-5 Units Subcutaneous QHS  . insulin aspart  0-9 Units Subcutaneous TID WC  . lactulose  10 g Oral TID  .  midodrine  15 mg Oral TID WC  . pantoprazole  40 mg Oral BID  . rifaximin  550 mg Oral BID  . sucralfate  1 g Oral TID WC & HS      Bufford Buttner MD 01/01/2017, 2:05 PM

## 2017-01-01 NOTE — Consult Note (Signed)
Neuropsychological Consultation   Patient:   Jeffery [name]   DOB:   09/03/1951  MR Number:  [id number]  [facility]:  [facility] [address] [facility] [address] [id number] [place] [address] Dept: [phone] Loc: [phone]           Date of Service:   12/30/2016  Start Time:   10 AM End Time:   10:58 AM  Provider/Observer:  Arley Phenix, Psy.D.       Clinical Neuropsychologist       Billing Code/Service: [phone] 4 Units  Chief Complaint:    The patient had R-AKA in Feb 2018.  Developed infection and had L-BKA on 12/23/2016.  End stage liver disease, HTN, T2DM with neuropathy. Adjustment difficulties due to above.  Reason for Service:  Jeffery Flurchickis a 66 y.o.malewith end stage liver disease R-AKA in [place] and L-BKA on 12/23/2016. The patient is also been dealing with septic arthritis and staph infection.   Current Status:  The patient is dealing with a lot of medical issues and is here in the rehabilitation program due to recent below the knee amputation of his left leg. He had recently had an above-the-knee amputation of his right leg.  Reliability of Information: Information is provided by the patient during the one-hour clinical interview and interaction as well as review of medical records and discussion with  Behavioral Observation: [name]  presents as a 66 y.o.-year-old Right  handed Caucasian Male who appeared his stated age. his dress was Appropriate and he was Well Groomed and his manners were Appropriate to the situation.  his participation was indicative of Appropriate and Attentive behaviors.  There were  physical disabilities noted.  he displayed an appropriate level of cooperation and motivation.     Interactions:    Active Appropriate and Attentive  Attention:   within normal limits and attention span and concentration were age appropriate  Memory:   within normal limits; recent  and remote memory intact  Visuo-spatial:  within normal limits  Speech (Volume):  low  Speech:   normal; Normal pace, clear with good lexicon.  Thought Process:  Coherent and Relevant  Though Content:  WNL; not suicidal but he does talk about dying and accepting his mortality and level of his medical illness and seriousness of what he has been going through.  Orientation:   person, place, time/date and situation  Judgment:   Good  Planning:   Good  Affect:    Flat  Mood:    Dysphoric and Irritable  Insight:   Present  Intelligence:   very high  Education:   PHD [employer]  Medical History:   Past Medical History:  Diagnosis Date  . Arthritis   . Asthma   . Bilateral cataracts   . Blurred vision    improved now  . Cholelithiasis   . Diabetes mellitus    type II  . Eczema    inner wrist  . Hypertension   . Muscle cramps   . Necrotizing fasciitis (HCC)   . Neuropathy   . Rash    wrist and hands since late june  . Retinal degeneration         Abuse/Trauma History: Patient has had both legs amputated over past few months.  Family Med/Psych History:  Family History  Problem Relation Age of Onset  . Cancer Mother     unknown type    Risk of Suicide/Violence: virtually non-existent   Impression/DX:  The patient has had Numerous medical issues happening over the past several months. The patient is in end-stage liver disease and has had both his right and left leg amputated over the past couple of months. The pacing just had a below the knee amputation of his left leg. The patient is also been dealing with staph infection as well as concerns about other issues. The patient has had some vomiting medical issues going on recently he has really been focused on existential issues and grappling with the possibility of losing the primary focus and enjoyment in his life which is his occupation/profession and working with [employer] Strategy [employer].  Disposition/Plan:  I  will make myself available to speak with the patient later this week if he would like to. We had a discussion about that possibility and he said he would consider what he would like to do going forward as far as building better coping skills and strategies dealing with everything that has happened to him medically.         Electronically Signed   _______________________ Arley Phenix, Psy.D.

## 2017-01-01 NOTE — Telephone Encounter (Signed)
Called done. Now in liver and progressing kidney failure. Stump with bloody drainage, was on heparin and platelets dropped . I will see in AM

## 2017-01-01 NOTE — Progress Notes (Signed)
Late entry. Patient transferred to [id number] from [id number] at approximatley 1800. Patient resting and left BKA dressing intact. No drainage noted at this time. Patient refused dinner tray. Oriented patient to new room and call bell system. Continue with plan of care.  Cleotilde Neer

## 2017-01-01 NOTE — Progress Notes (Signed)
Nutrition Follow-up  DOCUMENTATION CODES:   Obesity unspecified  INTERVENTION:  Provide Ensure Enlive po TID, each supplement provides 350 kcal and 20 grams of protein.  Discontinue Glucerna Shake.   Continue 30 ml Prostat po BID, each supplement provides 100 kcal and 15 grams of protein.   Encourage adequate PO intake.   NUTRITION DIAGNOSIS:   Increased nutrient needs related to chronic illness as evidenced by estimated needs; ongoing  GOAL:   Patient will meet greater than or equal to 90% of their needs; progressing  MONITOR:   PO intake, Supplement acceptance, Labs, Weight trends, Skin, I & O's  REASON FOR ASSESSMENT:   Consult Diet education  ASSESSMENT:   66 y.o. 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 He was discharged to SNF for rehab but was readmitted on 12/18/16 with N/V, weakness and acute renal failure. He was treated with IVF and started on IV antibiotics as reports of recent staph infection. MRI of foot reviewed, inflammation of tissue and bone. Per report, osteomyelitis with septic arthritis and myofasciitis.  He underwent paracentesis of 50 cc yellow fluid on 4/4 for recurrent ascites.  Nephrology consulted for input on acute on chronic renal failure and felt that renal failure multifactorial and is following for input.  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   During time of visit, pt reports extreme nausea and feelings to wanting to vomit. RD to revisit. Discussed with RN regarding patients po intake. She reports pt has poor po intake recently with no/refusal of po today. Noted pt discussed liking Ensure. RD to discontinue Glucerna shake and order Ensure instead. Labs and medications reviewed.   Diet Order:  Diet renal with fluid restriction Fluid  restriction: 1200 mL Fluid; Room service appropriate? Yes; Fluid consistency: Thin  Skin:  Wound (see comment) (Stage I L hip, incision L leg)  Last BM:  4/16  Height:   Ht Readings from Last 1 Encounters:  12/27/16  (1.854 m)    Weight:   Wt Readings from Last 1 Encounters:  01/01/17 236 lb 7.8 oz (107.3 kg)    Ideal Body Weight:  72.4 kg (adjusted for R AKA and L BKA)  BMI:  Body mass index is 31.2 kg/m.  Estimated Nutritional Needs:   Kcal:  2200-2400  Protein:  105-125 grams  Fluid:  1.2 L/day  EDUCATION NEEDS:   Education needs addressed  Roslyn Smiling, MS, RD, LDN Pager # [phone] After hours/ weekend pager # [phone]

## 2017-01-01 NOTE — Progress Notes (Signed)
Subjective/Complaints: Discussed medical headwinds to rehab progress.  Pt was anxious earlier this am per RN but somnolent since receiving muscle relaxants  ROS: pt denies nausea, vomiting, diarrhea, cough, shortness of breath or chest pain  Objective: Vital Signs: Blood pressure 110/64, pulse 73, temperature 98.9 F (37.2 C), temperature source Oral, resp. rate 18, height 6' 1"  (1.854 m), weight 107.3 kg (236 lb 7.8 oz), SpO2 99 %. No results found. Results for orders placed or performed during the hospital encounter of 12/27/16 (from the past 72 hour(s))  Renal function panel     Status: Abnormal   Collection Time: 12/29/16 10:32 AM  Result Value Ref Range   Sodium 130 (L) 135 - 145 mmol/L   Potassium 4.3 3.5 - 5.1 mmol/L   Chloride 103 101 - 111 mmol/L   CO2 19 (L) 22 - 32 mmol/L   Glucose, Bld 145 (H) 65 - 99 mg/dL   BUN 49 (H) 6 - 20 mg/dL   Creatinine, Ser 2.84 (H) 0.61 - 1.24 mg/dL   Calcium 8.2 (L) 8.9 - 10.3 mg/dL   Phosphorus 3.0 2.5 - 4.6 mg/dL   Albumin 2.0 (L) 3.5 - 5.0 g/dL   GFR calc non Af Amer 22 (L) >60 mL/min   GFR calc Af Amer 25 (L) >60 mL/min    Comment: (NOTE) The eGFR has been calculated using the CKD EPI equation. This calculation has not been validated in all clinical situations. eGFR's persistently <60 mL/min signify possible Chronic Kidney Disease.    Anion gap 8 5 - 15  Protime-INR     Status: Abnormal   Collection Time: 12/29/16 11:30 AM  Result Value Ref Range   Prothrombin Time 26.3 (H) 11.4 - 15.2 seconds   INR 2.36   APTT     Status: Abnormal   Collection Time: 12/29/16 11:30 AM  Result Value Ref Range   aPTT 113 (H) 24 - 36 seconds    Comment:        IF BASELINE aPTT IS ELEVATED, SUGGEST PATIENT RISK ASSESSMENT BE USED TO DETERMINE APPROPRIATE ANTICOAGULANT THERAPY.   CBC with Differential/Platelet     Status: Abnormal   Collection Time: 12/29/16 11:31 AM  Result Value Ref Range   WBC 5.0 4.0 - 10.5 K/uL   RBC 2.36 (L) 4.22 -  5.81 MIL/uL   Hemoglobin 7.7 (L) 13.0 - 17.0 g/dL   HCT 23.2 (L) 39.0 - 52.0 %   MCV 98.3 78.0 - 100.0 fL   MCH 32.6 26.0 - 34.0 pg   MCHC 33.2 30.0 - 36.0 g/dL   RDW 19.1 (H) 11.5 - 15.5 %   Platelets 68 (L) 150 - 400 K/uL    Comment: REPEATED TO VERIFY CONSISTENT WITH PREVIOUS RESULT    Neutrophils Relative % 52 %   Neutro Abs 2.6 1.7 - 7.7 K/uL   Lymphocytes Relative 24 %   Lymphs Abs 1.2 0.7 - 4.0 K/uL   Monocytes Relative 12 %   Monocytes Absolute 0.6 0.1 - 1.0 K/uL   Eosinophils Relative 12 %   Eosinophils Absolute 0.6 0.0 - 0.7 K/uL   Basophils Relative 0 %   Basophils Absolute 0.0 0.0 - 0.1 K/uL  Glucose, capillary     Status: Abnormal   Collection Time: 12/29/16 12:00 PM  Result Value Ref Range   Glucose-Capillary 124 (H) 65 - 99 mg/dL  Body fluid cell count with differential     Status: Abnormal   Collection Time: 12/29/16  1:26 PM  Result  Value Ref Range   Fluid Type-FCT Peritoneal    Color, Fluid YELLOW YELLOW   Appearance, Fluid HAZY (A) CLEAR   WBC, Fluid 63 0 - 1,000 cu mm   Neutrophil Count, Fluid 23 0 - 25 %   Lymphs, Fluid 31 %   Monocyte-Macrophage-Serous Fluid 46 (L) 50 - 90 %  Albumin, pleural or peritoneal fluid     Status: None   Collection Time: 12/29/16  1:26 PM  Result Value Ref Range   Albumin, Fluid <1.0 g/dL    Comment: REPEATED TO VERIFY (NOTE) No normal range established for this test Results should be evaluated in conjunction with serum values    Fluid Type-FALB Peritoneal   Glucose, plural or peritoneal fluid     Status: None   Collection Time: 12/29/16  1:26 PM  Result Value Ref Range   Glucose, Fluid 152 mg/dL    Comment: (NOTE) No normal range established for this test Results should be evaluated in conjunction with serum values    Fluid Type-FGLU Peritoneal   Culture, body fluid-bottle     Status: None (Preliminary result)   Collection Time: 12/29/16  1:26 PM  Result Value Ref Range   Specimen Description FLUID    Special  Requests PERITONEAL    Culture NO GROWTH 2 DAYS    Report Status PENDING   Gram stain     Status: None   Collection Time: 12/29/16  1:26 PM  Result Value Ref Range   Specimen Description FLUID    Special Requests PERITONEAL    Gram Stain NO WBC SEEN NO ORGANISMS SEEN     Report Status 12/29/2016 FINAL   Glucose, capillary     Status: Abnormal   Collection Time: 12/29/16  4:45 PM  Result Value Ref Range   Glucose-Capillary 112 (H) 65 - 99 mg/dL  Glucose, capillary     Status: Abnormal   Collection Time: 12/29/16  9:51 PM  Result Value Ref Range   Glucose-Capillary 113 (H) 65 - 99 mg/dL   Comment 1 Notify RN   Glucose, capillary     Status: Abnormal   Collection Time: 12/30/16  6:29 AM  Result Value Ref Range   Glucose-Capillary 104 (H) 65 - 99 mg/dL   Comment 1 Notify RN   Glucose, capillary     Status: Abnormal   Collection Time: 12/30/16 11:37 AM  Result Value Ref Range   Glucose-Capillary 109 (H) 65 - 99 mg/dL  Glucose, capillary     Status: Abnormal   Collection Time: 12/30/16  4:34 PM  Result Value Ref Range   Glucose-Capillary 122 (H) 65 - 99 mg/dL  Glucose, capillary     Status: Abnormal   Collection Time: 12/30/16  9:11 PM  Result Value Ref Range   Glucose-Capillary 184 (H) 65 - 99 mg/dL  Renal function panel     Status: Abnormal   Collection Time: 12/31/16  5:03 AM  Result Value Ref Range   Sodium 132 (L) 135 - 145 mmol/L   Potassium 4.6 3.5 - 5.1 mmol/L   Chloride 103 101 - 111 mmol/L   CO2 23 22 - 32 mmol/L   Glucose, Bld 137 (H) 65 - 99 mg/dL   BUN 56 (H) 6 - 20 mg/dL   Creatinine, Ser 3.23 (H) 0.61 - 1.24 mg/dL   Calcium 8.3 (L) 8.9 - 10.3 mg/dL   Phosphorus 3.7 2.5 - 4.6 mg/dL   Albumin 1.8 (L) 3.5 - 5.0 g/dL   GFR calc non Af  Amer 19 (L) >60 mL/min   GFR calc Af Amer 21 (L) >60 mL/min    Comment: (NOTE) The eGFR has been calculated using the CKD EPI equation. This calculation has not been validated in all clinical situations. eGFR's persistently  <60 mL/min signify possible Chronic Kidney Disease.    Anion gap 6 5 - 15  CBC     Status: Abnormal   Collection Time: 12/31/16  5:03 AM  Result Value Ref Range   WBC 3.9 (L) 4.0 - 10.5 K/uL   RBC 2.22 (L) 4.22 - 5.81 MIL/uL   Hemoglobin 7.2 (L) 13.0 - 17.0 g/dL   HCT 21.4 (L) 39.0 - 52.0 %   MCV 96.4 78.0 - 100.0 fL   MCH 32.4 26.0 - 34.0 pg   MCHC 33.6 30.0 - 36.0 g/dL   RDW 19.1 (H) 11.5 - 15.5 %   Platelets 54 (L) 150 - 400 K/uL    Comment: CONSISTENT WITH PREVIOUS RESULT  Glucose, capillary     Status: Abnormal   Collection Time: 12/31/16  6:39 AM  Result Value Ref Range   Glucose-Capillary 115 (H) 65 - 99 mg/dL  Type and screen [place]     Status: None   Collection Time: 12/31/16  7:45 AM  Result Value Ref Range   ABO/RH(D) A POS    Antibody Screen NEG    Sample Expiration 01/03/2017    Unit Number [id number]    Blood Component Type RED CELLS,LR    Unit division 00    Status of Unit ISSUED,FINAL    Transfusion Status OK TO TRANSFUSE    Crossmatch Result Compatible   Prepare RBC     Status: None   Collection Time: 12/31/16  7:52 AM  Result Value Ref Range   Order Confirmation ORDER PROCESSED BY BLOOD BANK   Glucose, capillary     Status: Abnormal   Collection Time: 12/31/16 10:43 AM  Result Value Ref Range   Glucose-Capillary 133 (H) 65 - 99 mg/dL  Glucose, capillary     Status: Abnormal   Collection Time: 12/31/16 11:38 AM  Result Value Ref Range   Glucose-Capillary 163 (H) 65 - 99 mg/dL  Urinalysis, Routine w reflex microscopic     Status: Abnormal   Collection Time: 12/31/16  2:41 PM  Result Value Ref Range   Color, Urine AMBER (A) YELLOW    Comment: BIOCHEMICALS MAY BE AFFECTED BY COLOR   APPearance HAZY (A) CLEAR   Specific Gravity, Urine 1.018 1.005 - 1.030   pH 5.0 5.0 - 8.0   Glucose, UA NEGATIVE NEGATIVE mg/dL   Hgb urine dipstick MODERATE (A) NEGATIVE   Bilirubin Urine NEGATIVE NEGATIVE   Ketones, ur NEGATIVE NEGATIVE  mg/dL   Protein, ur NEGATIVE NEGATIVE mg/dL   Nitrite NEGATIVE NEGATIVE   Leukocytes, UA NEGATIVE NEGATIVE   RBC / HPF 0-5 0 - 5 RBC/hpf   WBC, UA 6-30 0 - 5 WBC/hpf   Bacteria, UA NONE SEEN NONE SEEN   Squamous Epithelial / LPF 0-5 (A) NONE SEEN   Hyaline Casts, UA PRESENT    Granular Casts, UA PRESENT   Glucose, capillary     Status: Abnormal   Collection Time: 12/31/16  4:42 PM  Result Value Ref Range   Glucose-Capillary 134 (H) 65 - 99 mg/dL  Occult blood card to lab, stool     Status: Abnormal   Collection Time: 12/31/16  8:17 PM  Result Value Ref Range   Fecal Occult Bld POSITIVE (  A) NEGATIVE  Glucose, capillary     Status: Abnormal   Collection Time: 12/31/16  9:00 PM  Result Value Ref Range   Glucose-Capillary 122 (H) 65 - 99 mg/dL  Basic metabolic panel     Status: Abnormal   Collection Time: 01/01/17  2:56 AM  Result Value Ref Range   Sodium 132 (L) 135 - 145 mmol/L   Potassium 4.4 3.5 - 5.1 mmol/L   Chloride 104 101 - 111 mmol/L   CO2 22 22 - 32 mmol/L   Glucose, Bld 117 (H) 65 - 99 mg/dL   BUN 54 (H) 6 - 20 mg/dL   Creatinine, Ser 3.35 (H) 0.61 - 1.24 mg/dL   Calcium 8.1 (L) 8.9 - 10.3 mg/dL   GFR calc non Af Amer 18 (L) >60 mL/min   GFR calc Af Amer 21 (L) >60 mL/min    Comment: (NOTE) The eGFR has been calculated using the CKD EPI equation. This calculation has not been validated in all clinical situations. eGFR's persistently <60 mL/min signify possible Chronic Kidney Disease.    Anion gap 6 5 - 15  Glucose, capillary     Status: Abnormal   Collection Time: 01/01/17  5:23 AM  Result Value Ref Range   Glucose-Capillary 103 (H) 65 - 99 mg/dL     HEENT: normal Cardio: RRR Resp: CTA GI: BS positive and NT, + ascites Extremity:  Pulses positive and 1+ Edema Skin:   Intact Neuro: very alert/Oriented and Abnormal Motor 4/5 BUE and bilateral HF Musc/Skel:  Other well healed R AKA, Left BKA with ACE wrap/wound healing Gen NAD   Assessment/Plan: 1.  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 Physiatrist is providing close team supervision and 24 hour management of active medical problems listed below. Physiatrist and rehab team continue to assess barriers to discharge/monitor patient progress toward functional and medical goals. FIM: Function - Bathing Position: Bed Body parts bathed by patient: Right arm, Left arm, Chest, Abdomen Body parts bathed by helper: Back, Buttocks Bathing not applicable: Right upper leg, Right lower leg, Left lower leg  Function- Upper Body Dressing/Undressing What is the patient wearing?: Hospital gown Pull over shirt/dress - Perfomed by patient: Thread/unthread right sleeve, Thread/unthread left sleeve, Put head through opening, Pull shirt over trunk Assist Level: Set up, Supervision or verbal cues Set up : To obtain clothing/put away Function - Lower Body Dressing/Undressing What is the patient wearing?: Pants Position: Bed Pants- Performed by patient: Thread/unthread right pants leg, Thread/unthread left pants leg Pants- Performed by helper: Pull pants up/down  Function - Toileting Toileting activity did not occur: Safety/medical concerns  Function - Air cabin crew transfer activity did not occur: Safety/medical concerns  Function - Chair/bed transfer Chair/bed transfer method: Lateral scoot Chair/bed transfer assist level: 2 helpers Chair/bed transfer assistive device: Sliding board Chair/bed transfer details: Manual facilitation for weight shifting, Verbal cues for safe use of DME/AE, Verbal cues for precautions/safety, Verbal cues for technique, Tactile cues for posture, Manual facilitation for placement  Function - Locomotion: Wheelchair Will patient use wheelchair at discharge?: Yes Type: Manual Max wheelchair distance: 250 Assist Level: Supervision  or verbal cues Assist Level: Supervision or verbal cues Assist Level: Supervision or verbal cues Turns around,maneuvers to table,bed, and toilet,negotiates 3% grade,maneuvers on rugs and over doorsills: No Function - Locomotion: Ambulation Ambulation activity did not occur: N/A  Function - Comprehension  Comprehension: Auditory Comprehension assist level: Follows complex conversation/direction with no assist  Function - Expression Expression: Verbal Expression assist level: Expresses complex ideas: With no assist  Function - Social Interaction Social Interaction assist level: Interacts appropriately with others - No medications needed.  Function - Problem Solving Problem solving assist level: Solves complex problems: Recognizes & self-corrects  Function - Memory Memory assist level: Complete Independence: No helper Patient normally able to recall (first 3 days only): Current season, Location of own room, Staff names and faces, That he or she is in a hospital   Medical Problem List and Plan: 1.  Gait abnormality and limitation in self-care secondary to left BKA with history of right AKA. CIR PT, OT cont rehab 2.  DVT Prophylaxis/Anticoagulation: Pharmaceutical: Heparin but wound oozing and fecal OB pos, low plt will stop, check PT/PTT       3. Pain Management: Controlled on tylenol prn 4. Mood: LCSW to follow for evaluation and support 5. Neuropsych: This patient is capable of making decisions on his own behalf. 6. Skin/Wound Care: Routine pressure relief measures.wound healing will be a challenge due to hypoalb 7. Fluids/Electrolytes/Nutrition: Strict I/O. 1200 cc FR daily, ensure supplement, appreciate dietary note 8. Acute on chronic renal insufficiency: poor po intake,  poor urine output (300cc?), weight stable. Cr up to 3.23 today   -begin low rate IVF for now  -check PVR's, UA/UCX  -further mgt per renal   -RD consult 9. Hypotension: Monitor BP bid--proamatine tid to help  with support.   -encourage fluids  -IVF as above 10. Cirrhosis of liver with ascites: On Xifaxan bid and lactulose qid  -intermittent nausea   -is afraid to eat "solids"  -RD consult 11. T2DM with neuropathy: Hgb A1c- 5.8-- Has been off meds for years. Monitor BS ac/hs.   -reasonable control but not eating CBG (last 3)   Recent Labs  12/31/16 1642 12/31/16 2100 01/01/17 0523  GLUCAP 134* 122* 103*    12. Hyponatremia: 132 today 13.  Acute on Chronic anemia- post -op/chronic kidney disease  -transfuse 1 unit PRBC 4/15  -wound oozing slowly  -+ stool for OB- stop hep check coags      LOS (Days) 5 A FACE TO FACE EVALUATION WAS PERFORMED  Anastassia Noack E 01/01/2017, 7:07 AM

## 2017-01-01 NOTE — Progress Notes (Signed)
Physical Therapy Session Note  Patient Details  Name: Jeffery [name] MRN: [id number] Date of Birth: Aug 19, 1951  Today's Date: 01/01/2017 PT Individual Time: 1100-1200 PT Individual Time Calculation (min): 60 min   Short Term Goals: Week 1:  PT Short Term Goal 1 (Week 1): pt will move supine> sit with bedrails, to R, with supervision consistently PT Short Term Goal 2 (Week 1): pt will transfer using slide board with assist of 1 person PT Short Term Goal 3 (Week 1): pt will propel w/c up/down 3% grade ramp with supervison PT Short Term Goal 4 (Week 1): pt will participate in power w/c evaluation  Skilled Therapeutic Interventions/Progress Updates: Pt presented in w/c agreeable to therapy. Pt stating recent episode of emesis and continues to feel nauseous, nsg aware. Performed UE strengthening activities for increased endurace as pt stating continues to feel fatigued from transferring out of bed with OT session. Reinforced pt edu on continuing to work on endurance for benefits of safety with functional activities and resonable return to PLOF. Pt participated in shoulder flexion to 90 with hold, bicep curls, chest press and attempted chair push ups, performed to fatigue. Noted drainage at incision site, informed nsg while in room to check BS. Nsg decided to change dressing promptly due to drainage. Pt able to assist nsg in hip flexion and knee extension for bandage placement. Pt then requesting to return to bed due to increased pain at incision site. Pt unable to verbalize pain level however nsg aware and obtained meds for pain management. Pt required maxA x2 SB transfer to R with cues for hips/head relationship and cues for pushing with BUE. Pt required mod/maxA for repositioning. Pt left in bed with nsg present.       Therapy Documentation Precautions:  Precautions Precautions: Fall Restrictions Weight Bearing Restrictions: Yes RLE Weight Bearing: Non weight bearing LLE Weight Bearing: Non  weight bearing Other Position/Activity Restrictions: NWBing LLE   See Function Navigator for Current Functional Status.   Therapy/Group: Individual Therapy  Sanae Willetts  Khyre Germond, PTA  01/01/2017, 12:23 PM

## 2017-01-01 NOTE — Progress Notes (Signed)
Occupational Therapy Session Note  Patient Details  Name: Jeffery [name] MRN: [id number] Date of Birth: 04-Jul-1951  Today's Date: 01/01/2017 OT Individual Time: 8657-8469 OT Individual Time Calculation (min): 60 min    Short Term Goals: Week 1:  OT Short Term Goal 1 (Week 1): Pt will complete slide board to [facility] with max A of 1. OT Short Term Goal 2 (Week 1): Pt will be able to wash LB with set up only from bed level. OT Short Term Goal 3 (Week 1): Pt will be able to don LB clothing with set up from bed level. OT Short Term Goal 4 (Week 1): Pt will demonstrate improved activity tolerance to participate for at least 10 continuous minutes without a rest break.   Skilled Therapeutic Interventions/Progress Updates:    Pt seen for OT session focusing on ADL re-training and mobility. Pt in supine upon arrival and voicing being on bedpan. While pt finished voiding, discussed goals for the day, d/c planning and benefits of OOB/ OOB tolerance. Total A for hygiene. He donned pants in supine, able to reach to thread residual limb through pants. Required rest breaks throughout bed level dressing activity due to decreased activity tolerance and complaints of nausea with rolling.  He required +2 assist for coming to EOB due to fatigue. He dressed UB seated EOB with supervision. Following rest break,  Completed +2 sliding board transfer into w/c. Pt left seated in w/c at end of session, all needs in reach.   Therapy Documentation Precautions:  Precautions Precautions: Fall Restrictions Weight Bearing Restrictions: Yes RLE Weight Bearing: Non weight bearing LLE Weight Bearing: Non weight bearing Other Position/Activity Restrictions: NWBing LLE Pain:    No/ denies pain ADL: ADL ADL Comments: refer to functional navigator  See Function Navigator for Current Functional Status.   Therapy/Group: Individual Therapy  [name], Vashti Bolanos C 01/01/2017, 7:14 AM

## 2017-01-01 NOTE — Telephone Encounter (Signed)
fyi

## 2017-01-01 NOTE — Progress Notes (Addendum)
Patient continues to have poor intake. Anasarca with 2+ pitting edema left residual limb as well as ascites and now with complaints of dry cough.  Will order CXR to rule out fluid overload.  Left BKA site with steady ooze from lateral aspect of incision. Has erythema on buttocks due to diarrhea. Ammonia levels stable--will hold lactulose for now. Recheck ammonia levels on Thursday. WOC consulted for input. Dr. Bertis Ruddy consulted for input and recommended following up with GI as well as surgeon for input on bleeding.   Discussed with Merilyn Baba PA for [facility] who recommended Vitamin K 10 mg IV x 3 days to reverse coagulopathy. Will contact Dr. Ophelia Charter to check wound.

## 2017-01-01 NOTE — Telephone Encounter (Signed)
Jeffery [name] From [facility] called this afternoon in regards to this patients incision. Dr. Ophelia Charter did a BKA on this patient on 12-23-16. His incision is oozing and she was wondering if possibly Dr. Ophelia Charter could come take a look at it? Her CB # [phone]. Thank you

## 2017-01-01 NOTE — Progress Notes (Signed)
Physical Therapy Session Note  Patient Details  Name: Jeffery [name] MRN: [id number] Date of Birth: 16-Jan-1951  Today's Date: 01/01/2017 PT Individual Time: 1415-1530 PT Individual Time Calculation (min): 75 min   Short Term Goals: Week 1:  PT Short Term Goal 1 (Week 1): pt will move supine> sit with bedrails, to R, with supervision consistently PT Short Term Goal 2 (Week 1): pt will transfer using slide board with assist of 1 person PT Short Term Goal 3 (Week 1): pt will propel w/c up/down 3% grade ramp with supervison PT Short Term Goal 4 (Week 1): pt will participate in power w/c evaluation  Skilled Therapeutic Interventions/Progress Updates:    c/o some pain in R residual limb but does not rate.  Session focus on rolling, supine<>sit, slide board transfers, w/c propulsion, and d/c planning.   Pt rolls L and R with increased time and supervision for removal of bedpan and total assist for hygiene.  Rolling L/R for pt to pull pants over hips.  Supine>sit with mod assist, pt fatigues half way through and requires rest break.  +2 for slide board transfer to and from bed.  Pt demos improved head/hips relationship with each transfer and pushes with UEs but continues to require +2 assist due to UE weakness/fatigue and poor balance.  Discussed d/c plans including PLOF and expected progress with PT, also discussed possibility of power mobility which pt is open to.  W/C propulsion x70' with BUEs and supervision, 1 short rest break 2/2 UE fatigue.  Pt returned to bed at end of session in same manner as above, call bell in reach and RN present for care.   Therapy Documentation Precautions:  Precautions Precautions: Fall Restrictions Weight Bearing Restrictions: Yes RLE Weight Bearing: Non weight bearing LLE Weight Bearing: Non weight bearing Other Position/Activity Restrictions: NWBing LLE   See Function Navigator for Current Functional Status.   Therapy/Group: Individual  Therapy  Ladora Daniel Penven-Crew 01/01/2017, 3:32 PM

## 2017-01-02 ENCOUNTER — Inpatient Hospital Stay (HOSPITAL_COMMUNITY): Payer: BC Managed Care – PPO | Admitting: Physical Therapy

## 2017-01-02 ENCOUNTER — Inpatient Hospital Stay (HOSPITAL_COMMUNITY): Payer: BC Managed Care – PPO | Admitting: Occupational Therapy

## 2017-01-02 ENCOUNTER — Inpatient Hospital Stay (HOSPITAL_COMMUNITY): Payer: BC Managed Care – PPO

## 2017-01-02 LAB — BASIC METABOLIC PANEL
ANION GAP: 7 (ref 5–15)
BUN: 57 mg/dL — ABNORMAL HIGH (ref 6–20)
CO2: 21 mmol/L — ABNORMAL LOW (ref 22–32)
Calcium: 8.2 mg/dL — ABNORMAL LOW (ref 8.9–10.3)
Chloride: 104 mmol/L (ref 101–111)
Creatinine, Ser: 3.33 mg/dL — ABNORMAL HIGH (ref 0.61–1.24)
GFR, EST AFRICAN AMERICAN: 21 mL/min — AB (ref >60)
GFR, EST NON AFRICAN AMERICAN: 18 mL/min — AB (ref >60)
GLUCOSE: 91 mg/dL (ref 65–99)
POTASSIUM: 4.6 mmol/L (ref 3.5–5.1)
SODIUM: 132 mmol/L — AB (ref 135–145)

## 2017-01-02 LAB — CBC
HEMATOCRIT: 23.1 % — AB (ref 39.0–52.0)
HEMOGLOBIN: 8 g/dL — AB (ref 13.0–17.0)
MCH: 33.1 pg (ref 26.0–34.0)
MCHC: 34.6 g/dL (ref 30.0–36.0)
MCV: 95.5 fL (ref 78.0–100.0)
Platelets: 67 10*3/uL — ABNORMAL LOW (ref 150–400)
RBC: 2.42 MIL/uL — AB (ref 4.22–5.81)
RDW: 19.9 % — ABNORMAL HIGH (ref 11.5–15.5)
WBC: 5.6 10*3/uL (ref 4.0–10.5)

## 2017-01-02 LAB — GLUCOSE, CAPILLARY
GLUCOSE-CAPILLARY: 84 mg/dL (ref 65–99)
GLUCOSE-CAPILLARY: 155 mg/dL — AB (ref 65–99)
Glucose-Capillary: 124 mg/dL — ABNORMAL HIGH (ref 65–99)
Glucose-Capillary: 161 mg/dL — ABNORMAL HIGH (ref 65–99)

## 2017-01-02 LAB — PROTIME-INR
INR: 2.25
Prothrombin Time: 25.3 seconds — ABNORMAL HIGH (ref 11.4–15.2)

## 2017-01-02 MED ORDER — BENEPROTEIN PO POWD
1.0000 | Freq: Three times a day (TID) | ORAL | Status: DC
  Administered 2017-01-02 (×2): 6 g via ORAL
  Filled 2017-01-02: qty 227

## 2017-01-02 NOTE — Plan of Care (Signed)
Pt's plan of care adjusted to 15/7 after speaking with care team and discussed with MD in team conference as pt currently unable to tolerate current therapy schedule with OTand PT. - Johnsie Cancel, OTR/L

## 2017-01-02 NOTE — Progress Notes (Signed)
Recreational Therapy Session Note  Patient Details  Name: Jeffery [name] MRN: [id number] Date of Birth: 19-Jan-1951 Today's Date: 01/02/2017  TR eval deferred as pt with low activity tolerance & limited interest in TR services at this time.  Will continue to monitor. [name] 01/02/2017, 2:45 PM

## 2017-01-02 NOTE — Progress Notes (Signed)
Occupational Therapy Session Note  Patient Details  Name: Jeffery [name] MRN: [id number] Date of Birth: 10-Dec-1950  Today's Date: 01/02/2017 OT Individual Time: 0835-0900 OT Individual Time Calculation (min): 25 min    Short Term Goals: Week 1:  OT Short Term Goal 1 (Week 1): Pt will complete slide board to [facility] with max A of 1. OT Short Term Goal 2 (Week 1): Pt will be able to wash LB with set up only from bed level. OT Short Term Goal 3 (Week 1): Pt will be able to don LB clothing with set up from bed level. OT Short Term Goal 4 (Week 1): Pt will demonstrate improved activity tolerance to participate for at least 10 continuous minutes without a rest break.   Skilled Therapeutic Interventions/Progress Updates:    MD and RN in room at beginning of session. Pt reporting fatigue, pain and nausea. Pt agreeable to sit at EOB and don pants. Pt supine>sitting EOB with MOD A for trunk facilitation and VC to push up through bed from arm and wrist. Pt sits EOB with MIN A fading to supervision. Pt reports dizziness and increased nausea from transition. Pt requires frequent rest breaks throughout session. Educated pt on leaning laterally to advance pants past hips while seated EOB. Pt able to thread B LE into pants and leans laterally with MIN A to return to midline and rest breaks after each lean. Pt unable to advance pants up hips completely and request to sit at EOB and rest until PT arrives in a few minutes. Exited session with pt seated EOB with bed exit alarm on.   Therapy Documentation Precautions:  Precautions Precautions: Fall Restrictions Weight Bearing Restrictions: Yes RLE Weight Bearing: Non weight bearing LLE Weight Bearing: Non weight bearing Other Position/Activity Restrictions: NWBing LLE General: Other Treatments:    See Function Navigator for Current Functional Status.   Therapy/Group: Individual Therapy  [name] 01/02/2017, 10:45 AM

## 2017-01-02 NOTE — Progress Notes (Signed)
Physical Therapy Session Note  Patient Details  Name: Jeffery [name] MRN: [id number] Date of Birth: 1951-08-10  Today's Date: 01/02/2017 PT Individual Time: 1000-1100 PT Individual Time Calculation (min): 60 min   Short Term Goals: Week 1:  PT Short Term Goal 1 (Week 1): pt will move supine> sit with bedrails, to R, with supervision consistently PT Short Term Goal 2 (Week 1): pt will transfer using slide board with assist of 1 person PT Short Term Goal 3 (Week 1): pt will propel w/c up/down 3% grade ramp with supervison PT Short Term Goal 4 (Week 1): pt will participate in power w/c evaluation  Skilled Therapeutic Interventions/Progress Updates: Pt presented sitting at EOB agreeable for therapy. Attempted SB transfer to R with maxA x 2. Pt required max cues for head/hips relationship. Pt with episode of incontinence during transfer. Performed SB transfer to R to [facility] total A x 2 due to incontinence. Pt tolerated sitting at [facility] x 8 min with LLE resting on w/c. Pt at times would become fearful of falling with PTA providing emotional support and indicating that he's on a stable surface and is using arms to help support self. Pt with episodes of lightheadedness and increased pain in RLQ while sitting at [facility]. Use of MaxiMove to transfer pt back to bed. Pt able to perform rolling L/R with minA to allow NT to perform peri-care. Pt attempted to sit at EOB requiring modA for supine to sit with use of bed rails however unable to tolerate upright sitting due to fatigue, returned to supine. Pt also able to tolerate pulling self up to [facility] after rest for repositioning. Pt in bed at end of session with bed alarm on and needs met.      Therapy Documentation Precautions:  Precautions Precautions: Fall Restrictions Weight Bearing Restrictions: Yes RLE Weight Bearing: Non weight bearing LLE Weight Bearing: Non weight bearing Other Position/Activity Restrictions: NWBing LLE    Other Treatments:     See  Function Navigator for Current Functional Status.   Therapy/Group: Individual Therapy  Karinna Beadles  Malisa Ruggiero, PTA  01/02/2017, 10:21 AM

## 2017-01-02 NOTE — Progress Notes (Signed)
Occupational Therapy Session Note  Patient Details  Name: Jeffery [name] MRN: [id number] Date of Birth: March 02, 1951  Today's Date: 01/02/2017 OT Individual Time: 1300-1310 and 1510-1626 OT Individual Time Calculation (min): 10 min and 76 min   Short Term Goals: Week 1:  OT Short Term Goal 1 (Week 1): Pt will complete slide board to [facility] with max A of 1. OT Short Term Goal 2 (Week 1): Pt will be able to wash LB with set up only from bed level. OT Short Term Goal 3 (Week 1): Pt will be able to don LB clothing with set up from bed level. OT Short Term Goal 4 (Week 1): Pt will demonstrate improved activity tolerance to participate for at least 10 continuous minutes without a rest break.   Skilled Therapeutic Interventions/Progress Updates:    Session One: Pt in supine upon arrival, easily aroused and voiced overall discomfort and weakness. He declined OOB and desiring to get onto bed pan. Encouraged pt to attempt to get on [facility] with use of lift and educated regarding benefits of upright toileting. Pt cont to decline due to feeling weak and since of urgency. +2 required to roll to place bed pan. Pt left on bed pan at end of session, NT made aware of pt's position and pt instructed to call when finished. Will return to pt later in afternoon for remainder of session.   Session Two: Therapist returned at 1510, pt asleep and requiring multimodal cuing and increased time for arousal. Pt agreeable to attempt OOB for air mattress to be placed in room. He rolled with +2 for lift sling to be placed and transfer to w/c. When OOB, pt agreeable to "attempt what we can". He self propelled w/c throughout unit requiring frequent rest breaks due to UE weakness and decreased activity tolerance.  In therapy gym, completed UE strengthening exercises utilizing #2 dowel rod. Completed x2 sets of 10 overhead press, chest press, and bicep curls with rest breaks provided btwn sets. Verbal and demonstrational cuing provided  for proper form and technique.  Pt then voiced he was unable to tolerate sitting in w/c any longer due to buttock pain. Returned to room and Maximove used to transfer pt to air mattress. Pt left in supine with all needs in reach and friends entering room.   Therapy Documentation Precautions:  Precautions Precautions: Fall Restrictions Weight Bearing Restrictions: Yes RLE Weight Bearing: Non weight bearing LLE Weight Bearing: Non weight bearing Other Position/Activity Restrictions: NWBing LLE ADL: ADL ADL Comments: refer to functional navigator  See Function Navigator for Current Functional Status.   Therapy/Group: Individual Therapy  [name], Eaven Schwager C 01/02/2017, 7:12 AM

## 2017-01-02 NOTE — Progress Notes (Signed)
   Subjective:    Patient reports pain as mild.    Objective: Vital signs in last 24 hours: Temp:  [97.6 F (36.4 C)-97.7 F (36.5 C)] 97.7 F (36.5 C) (04/17 0438) Pulse Rate:  [83-86] 86 (04/17 0438) Resp:  [18-19] 18 (04/17 0438) BP: (120-134)/(48-53) 120/53 (04/17 0438) SpO2:  [100 %] 100 % (04/17 0438) Weight:  [233 lb 11 oz (106 kg)] 233 lb 11 oz (106 kg) (04/17 0438)  Intake/Output from previous day: 04/16 0701 - 04/17 0700 In: 740 [P.O.:240; I.V.:500] Out: -  Intake/Output this shift: Total I/O In: 240 [P.O.:240] Out: 100 [Urine:100]   Recent Labs  12/31/16 0503 01/02/17 0636  HGB 7.2* 8.0*    Recent Labs  12/31/16 0503 01/02/17 0636  WBC 3.9* 5.6  RBC 2.22* 2.42*  HCT 21.4* 23.1*  PLT 54* 67*    Recent Labs  01/01/17 0256 01/02/17 0636  NA 132* 132*  K 4.4 4.6  CL 104 104  CO2 22 21*  BUN 54* 57*  CREATININE 3.35* 3.33*  GLUCOSE 117* 91  CALCIUM 8.1* 8.2*    Recent Labs  01/01/17 0735 01/02/17 0636  INR 2.21 2.25    Physical exam: Patient's had some bloody drainage. No cellulitis and no skin edge necrosis. Dg Chest 2 View  Result Date: 01/01/2017 CLINICAL DATA:  Persistent cough. EXAM: CHEST  2 VIEW COMPARISON:  12/18/2016 FINDINGS: AP and lateral views of the chest show markedly low lung volumes without pulmonary edema or pleural effusion. Probable subsegmental atelectasis right base is new in the 2 week interval since prior study. The cardiopericardial silhouette is within normal limits for size. The visualized bony structures of the thorax are intact. IMPRESSION: New right basilar opacity suggests atelectasis. Low lung volumes. Electronically Signed   By: Kennith Center M.D.   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 Would recommend giving no  anticoagulation due to his liver problems. He is already anticoagulated due to his liver disease. Stump elevation and dressing changed daily and as needed. Betadine swab can be applied to his incision at the time of dressing change.  Jeffery [name] 01/02/2017, 12:35 PM

## 2017-01-02 NOTE — Progress Notes (Signed)
Subjective/Complaints:  Abdominal discomfort when shifting side to side in bed Discussed stump with Ortho ROS: pt denies nausea, vomiting, diarrhea, cough, shortness of breath or chest pain  Objective: Vital Signs: Blood pressure (!) 120/53, pulse 86, temperature 97.7 F (36.5 C), temperature source Oral, resp. rate 18, height 6' 1"  (1.854 m), weight 106 kg (233 lb 11 oz), SpO2 100 %. Dg Chest 2 View  Result Date: 01/01/2017 CLINICAL DATA:  Persistent cough. EXAM: CHEST  2 VIEW COMPARISON:  12/18/2016 FINDINGS: AP and lateral views of the chest show markedly low lung volumes without pulmonary edema or pleural effusion. Probable subsegmental atelectasis right base is new in the 2 week interval since prior study. The cardiopericardial silhouette is within normal limits for size. The visualized bony structures of the thorax are intact. IMPRESSION: New right basilar opacity suggests atelectasis. Low lung volumes. Electronically Signed   By: Misty Stanley M.D.   On: 01/01/2017 17:38   Results for orders placed or performed during the hospital encounter of 12/27/16 (from the past 72 hour(s))  Glucose, capillary     Status: Abnormal   Collection Time: 12/30/16 11:37 AM  Result Value Ref Range   Glucose-Capillary 109 (H) 65 - 99 mg/dL  Glucose, capillary     Status: Abnormal   Collection Time: 12/30/16  4:34 PM  Result Value Ref Range   Glucose-Capillary 122 (H) 65 - 99 mg/dL  Glucose, capillary     Status: Abnormal   Collection Time: 12/30/16  9:11 PM  Result Value Ref Range   Glucose-Capillary 184 (H) 65 - 99 mg/dL  Renal function panel     Status: Abnormal   Collection Time: 12/31/16  5:03 AM  Result Value Ref Range   Sodium 132 (L) 135 - 145 mmol/L   Potassium 4.6 3.5 - 5.1 mmol/L   Chloride 103 101 - 111 mmol/L   CO2 23 22 - 32 mmol/L   Glucose, Bld 137 (H) 65 - 99 mg/dL   BUN 56 (H) 6 - 20 mg/dL   Creatinine, Ser 3.23 (H) 0.61 - 1.24 mg/dL   Calcium 8.3 (L) 8.9 - 10.3 mg/dL    Phosphorus 3.7 2.5 - 4.6 mg/dL   Albumin 1.8 (L) 3.5 - 5.0 g/dL   GFR calc non Af Amer 19 (L) >60 mL/min   GFR calc Af Amer 21 (L) >60 mL/min    Comment: (NOTE) The eGFR has been calculated using the CKD EPI equation. This calculation has not been validated in all clinical situations. eGFR's persistently <60 mL/min signify possible Chronic Kidney Disease.    Anion gap 6 5 - 15  CBC     Status: Abnormal   Collection Time: 12/31/16  5:03 AM  Result Value Ref Range   WBC 3.9 (L) 4.0 - 10.5 K/uL   RBC 2.22 (L) 4.22 - 5.81 MIL/uL   Hemoglobin 7.2 (L) 13.0 - 17.0 g/dL   HCT 21.4 (L) 39.0 - 52.0 %   MCV 96.4 78.0 - 100.0 fL   MCH 32.4 26.0 - 34.0 pg   MCHC 33.6 30.0 - 36.0 g/dL   RDW 19.1 (H) 11.5 - 15.5 %   Platelets 54 (L) 150 - 400 K/uL    Comment: CONSISTENT WITH PREVIOUS RESULT  Glucose, capillary     Status: Abnormal   Collection Time: 12/31/16  6:39 AM  Result Value Ref Range   Glucose-Capillary 115 (H) 65 - 99 mg/dL  Type and screen [place]     Status:  None   Collection Time: 12/31/16  7:45 AM  Result Value Ref Range   ABO/RH(D) A POS    Antibody Screen NEG    Sample Expiration 01/03/2017    Unit Number [id number]    Blood Component Type RED CELLS,LR    Unit division 00    Status of Unit ISSUED,FINAL    Transfusion Status OK TO TRANSFUSE    Crossmatch Result Compatible   Prepare RBC     Status: None   Collection Time: 12/31/16  7:52 AM  Result Value Ref Range   Order Confirmation ORDER PROCESSED BY BLOOD BANK   Glucose, capillary     Status: Abnormal   Collection Time: 12/31/16 10:43 AM  Result Value Ref Range   Glucose-Capillary 133 (H) 65 - 99 mg/dL  Glucose, capillary     Status: Abnormal   Collection Time: 12/31/16 11:38 AM  Result Value Ref Range   Glucose-Capillary 163 (H) 65 - 99 mg/dL  Urinalysis, Routine w reflex microscopic     Status: Abnormal   Collection Time: 12/31/16  2:41 PM  Result Value Ref Range   Color, Urine AMBER  (A) YELLOW    Comment: BIOCHEMICALS MAY BE AFFECTED BY COLOR   APPearance HAZY (A) CLEAR   Specific Gravity, Urine 1.018 1.005 - 1.030   pH 5.0 5.0 - 8.0   Glucose, UA NEGATIVE NEGATIVE mg/dL   Hgb urine dipstick MODERATE (A) NEGATIVE   Bilirubin Urine NEGATIVE NEGATIVE   Ketones, ur NEGATIVE NEGATIVE mg/dL   Protein, ur NEGATIVE NEGATIVE mg/dL   Nitrite NEGATIVE NEGATIVE   Leukocytes, UA NEGATIVE NEGATIVE   RBC / HPF 0-5 0 - 5 RBC/hpf   WBC, UA 6-30 0 - 5 WBC/hpf   Bacteria, UA NONE SEEN NONE SEEN   Squamous Epithelial / LPF 0-5 (A) NONE SEEN   Hyaline Casts, UA PRESENT    Granular Casts, UA PRESENT   Culture, Urine     Status: None   Collection Time: 12/31/16  2:41 PM  Result Value Ref Range   Specimen Description URINE, CATHETERIZED    Special Requests NONE    Culture NO GROWTH    Report Status 01/01/2017 FINAL   Glucose, capillary     Status: Abnormal   Collection Time: 12/31/16  4:42 PM  Result Value Ref Range   Glucose-Capillary 134 (H) 65 - 99 mg/dL  Occult blood card to lab, stool     Status: Abnormal   Collection Time: 12/31/16  8:17 PM  Result Value Ref Range   Fecal Occult Bld POSITIVE (A) NEGATIVE  Glucose, capillary     Status: Abnormal   Collection Time: 12/31/16  9:00 PM  Result Value Ref Range   Glucose-Capillary 122 (H) 65 - 99 mg/dL  Basic metabolic panel     Status: Abnormal   Collection Time: 01/01/17  2:56 AM  Result Value Ref Range   Sodium 132 (L) 135 - 145 mmol/L   Potassium 4.4 3.5 - 5.1 mmol/L   Chloride 104 101 - 111 mmol/L   CO2 22 22 - 32 mmol/L   Glucose, Bld 117 (H) 65 - 99 mg/dL   BUN 54 (H) 6 - 20 mg/dL   Creatinine, Ser 3.35 (H) 0.61 - 1.24 mg/dL   Calcium 8.1 (L) 8.9 - 10.3 mg/dL   GFR calc non Af Amer 18 (L) >60 mL/min   GFR calc Af Amer 21 (L) >60 mL/min    Comment: (NOTE) The eGFR has been calculated using the CKD EPI  equation. This calculation has not been validated in all clinical situations. eGFR's persistently <60 mL/min  signify possible Chronic Kidney Disease.    Anion gap 6 5 - 15  Glucose, capillary     Status: Abnormal   Collection Time: 01/01/17  5:23 AM  Result Value Ref Range   Glucose-Capillary 103 (H) 65 - 99 mg/dL  Protime-INR     Status: Abnormal   Collection Time: 01/01/17  7:35 AM  Result Value Ref Range   Prothrombin Time 24.9 (H) 11.4 - 15.2 seconds   INR 2.21   APTT     Status: Abnormal   Collection Time: 01/01/17  7:35 AM  Result Value Ref Range   aPTT 83 (H) 24 - 36 seconds    Comment:        IF BASELINE aPTT IS ELEVATED, SUGGEST PATIENT RISK ASSESSMENT BE USED TO DETERMINE APPROPRIATE ANTICOAGULANT THERAPY.   Glucose, capillary     Status: Abnormal   Collection Time: 01/01/17 11:35 AM  Result Value Ref Range   Glucose-Capillary 116 (H) 65 - 99 mg/dL  Glucose, capillary     Status: Abnormal   Collection Time: 01/01/17  9:54 PM  Result Value Ref Range   Glucose-Capillary 108 (H) 65 - 99 mg/dL     HEENT: normal Cardio: RRR Resp: CTA GI: BS positive and NT, + ascites Extremity:  No UE edema, distal L BK stump edema Skin:   Intact Neuro: very alert/Oriented and Abnormal Motor 4/5 BUE and bilateral HF Musc/Skel:  Other well healed R AKA, Left BKA with ACE wrap/wound healing Gen NAD   Assessment/Plan: 1. 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 Physiatrist is providing close team supervision and 24 hour management of active medical problems listed below. Physiatrist and rehab team continue to assess barriers to discharge/monitor patient progress toward functional and medical goals. FIM: Function - Bathing Position: Bed Body parts bathed by patient: Right arm, Left arm, Chest, Abdomen Body parts bathed by helper: Back, Buttocks Bathing not applicable: Right upper leg, Right lower leg, Left lower leg  Function- Upper Body  Dressing/Undressing What is the patient wearing?: Pull over shirt/dress Pull over shirt/dress - Perfomed by patient: Thread/unthread right sleeve, Thread/unthread left sleeve, Put head through opening, Pull shirt over trunk Assist Level: Set up, Supervision or verbal cues Set up : To obtain clothing/put away Function - Lower Body Dressing/Undressing What is the patient wearing?: Pants Position: Bed Pants- Performed by patient: Thread/unthread right pants leg, Thread/unthread left pants leg, Pull pants up/down Pants- Performed by helper: Pull pants up/down Assist for footwear:  (bilateral amputee)  Function - Toileting Toileting activity did not occur: Safety/medical concerns Toileting steps completed by helper: Adjust clothing prior to toileting, Performs perineal hygiene, Adjust clothing after toileting (per Benjamin Stain, NT)  Function - Toilet Transfers Toilet transfer activity did not occur: Safety/medical concerns Assist level to toilet: 2 helpers (per Benjamin Stain, NT) Assist level from toilet: 2 helpers  Function - Chair/bed transfer Chair/bed transfer method: Lateral scoot Chair/bed transfer assist level: 2 helpers Chair/bed transfer assistive device: Sliding board, Armrests Chair/bed transfer details: Manual facilitation for weight shifting, Verbal cues for safe use of DME/AE, Verbal cues for precautions/safety, Verbal cues for technique, Tactile cues for posture, Manual facilitation for placement  Function - Locomotion: Wheelchair Will patient use wheelchair at discharge?: Yes Type: Manual Max wheelchair distance: 250 Assist Level:  Supervision or verbal cues Assist Level: Supervision or verbal cues Assist Level: Supervision or verbal cues Turns around,maneuvers to table,bed, and toilet,negotiates 3% grade,maneuvers on rugs and over doorsills: No Function - Locomotion: Ambulation Ambulation activity did not occur: N/A  Function - Comprehension Comprehension:  Auditory Comprehension assist level: Follows complex conversation/direction with no assist  Function - Expression Expression: Verbal Expression assist level: Expresses complex ideas: With no assist  Function - Social Interaction Social Interaction assist level: Interacts appropriately with others - No medications needed.  Function - Problem Solving Problem solving assist level: Solves complex problems: Recognizes & self-corrects  Function - Memory Memory assist level: Complete Independence: No helper Patient normally able to recall (first 3 days only): Current season, Location of own room, Staff names and faces, That he or she is in a hospital   Medical Problem List and Plan: 1.  Gait abnormality and limitation in self-care secondary to left BKA with history of right AKA. CIR PT, OT cont rehab, change to 15/7 2.  DVT Prophylaxis/Anticoagulation: Pharmaceutical:off heparin due to elevated PT/PTT       3. Pain Management: Controlled on tylenol prn, except positional abd pain due to ascites- rec repeat US guided paracentesis 4. Mood: LCSW to follow for evaluation and support 5. Neuropsych: This patient is capable of making decisions on his own behalf. 6. Skin/Wound Care: Routine pressure relief measures.wound healing will be a challenge due to hypoalb 7. Fluids/Electrolytes/Nutrition: Strict I/O. 1200 cc FR daily, ensure supplement,Will be challenge to maintain nutrition given low grade nauea from hepatorenal failure 8. Acute on chronic renal insufficiency: poor po intake,  poor urine output (300cc?), weight stable. Cr up to 3.23 today    -check PVR's, UA/UCX  -further mgt per renal   -RD consult 9. Hypotension: Monitor BP bid--proamatine tid to help with support.   -encourage fluids  -IVF as above 10. Cirrhosis of liver with ascites: On Xifaxan bid and lactulose qid  -intermittent nausea   -is afraid to eat "solids"  -RD consult 11. T2DM with neuropathy: Hgb A1c- 5.8-- Has been  off meds for years. Monitor BS ac/hs.   -reasonable control but not eating CBG (last 3)   Recent Labs  01/01/17 0523 01/01/17 1135 01/01/17 2154  GLUCAP 103* 116* 108*    12. Hyponatremia: 132 today 13.  Acute on Chronic anemia- post -op/chronic kidney disease  -transfuse 1 unit PRBC 4/15  -wound oozing slowly, coagulopathy with elevated PT/PTT        LOS (Days) 6 A FACE TO FACE EVALUATION WAS PERFORMED  KIRSTEINS,ANDREW E 01/02/2017, 6:48 AM

## 2017-01-02 NOTE — Progress Notes (Signed)
Discussed patient with Dr. Signe Colt. Patient not voiding cath volumes three times a day range from 100- 150 cc in part due to poor output. He had 1000 cc this am due to IVF and she felt that this was positive.  SCr seems to be stabilizing and no more IVF needed. She recommends monitoring UOP for now and OK to perform therapeutic paracentesis max 1500 cc. Will write order for procedure tomorrow am. INR still elevated today. Will liberalize diet to low salt to allow food choices and help with intake.

## 2017-01-02 NOTE — Progress Notes (Signed)
[facility] Progress Note    Assessment/ Plan:   1 AKI /CKD3- Exacerbation prob due to ABLA and unstable hemodynamics.  Creatinine has plateaued. 2 Cirrhosis w/ ascites- I think a 1-1.5L paracentesis would be OK.  Do not recommend large vol paracentesis at this time.  Will likely need Lasix/ aldactone if renal function can support it 3 DM 4 PVD post new L BKA- completing therapies in rehab, ortho following 5 Anemia, s/p 1u pRBCs with appropriate response.  Subjective:    Creatinine has plateaued.  Feeling more distended today.   Objective:   BP 124/89 (BP Location: Left Arm)   Pulse 93   Temp 97.8 F (36.6 C) (Oral)   Resp 18   Ht  (1.854 m)   Wt 106 kg (233 lb 11 oz)   SpO2 100%   BMI 30.83 kg/m   Intake/Output Summary (Last 24 hours) at 01/02/17 1408 Last data filed at 01/02/17 1350  Gross per 24 hour  Intake             1040 ml  Output              100 ml  Net              940 ml   Weight change: -1.269 kg (-2 lb 12.8 oz)  Physical Exam: General appearance: alert and cooperative HEENT: EOMI, no JVD GI: soft, non-tender; bowel sounds normal; no masses,  no organomegaly and large ascites  No asterixis Bilat amp;R AKA, L BKA wrapped   Imaging: Dg Chest 2 View  Result Date: 01/01/2017 CLINICAL DATA:  Persistent cough. EXAM: CHEST  2 VIEW COMPARISON:  12/18/2016 FINDINGS: AP and lateral views of the chest show markedly low lung volumes without pulmonary edema or pleural effusion. Probable subsegmental atelectasis right base is new in the 2 week interval since prior study. The cardiopericardial silhouette is within normal limits for size. The visualized bony structures of the thorax are intact. IMPRESSION: New right basilar opacity suggests atelectasis. Low lung volumes. Electronically Signed   By: Kennith Center M.D.   On: 01/01/2017 17:38    Labs: BMET  Recent Labs Lab 12/27/16 0420 12/29/16 1032 12/31/16 0503 01/01/17 0256 01/02/17 0636   NA 134* 130* 132* 132* 132*  K 4.4 4.3 4.6 4.4 4.6  CL 106 103 103 104 104  CO2 24 19* 23 22 21*  GLUCOSE 177* 145* 137* 117* 91  BUN 51* 49* 56* 54* 57*  CREATININE 2.84* 2.84* 3.23* 3.35* 3.33*  CALCIUM 8.3* 8.2* 8.3* 8.1* 8.2*  PHOS 3.0 3.0 3.7  --   --    CBC  Recent Labs Lab 12/29/16 1131 12/31/16 0503 01/02/17 0636  WBC 5.0 3.9* 5.6  NEUTROABS 2.6  --   --   HGB 7.7* 7.2* 8.0*  HCT 23.2* 21.4* 23.1*  MCV 98.3 96.4 95.5  PLT 68* 54* 67*    Medications:    . brimonidine  1 drop Both Eyes Daily  . feeding supplement (ENSURE ENLIVE)  237 mL Oral TID BM  . feeding supplement (PRO-STAT SUGAR FREE 64)  30 mL Oral BID  . insulin aspart  0-5 Units Subcutaneous QHS  . insulin aspart  0-9 Units Subcutaneous TID WC  . midodrine  15 mg Oral TID WC  . pantoprazole  40 mg Oral BID  . protein supplement  1 scoop Oral TID WC  . rifaximin  550 mg Oral BID  . sucralfate  1 g Oral TID  WC & HS      Bufford Buttner MD 01/02/2017, 2:08 PM

## 2017-01-03 ENCOUNTER — Encounter (HOSPITAL_COMMUNITY): Payer: Self-pay | Admitting: Student

## 2017-01-03 ENCOUNTER — Inpatient Hospital Stay (HOSPITAL_COMMUNITY): Payer: BC Managed Care – PPO | Admitting: Physical Therapy

## 2017-01-03 ENCOUNTER — Inpatient Hospital Stay (HOSPITAL_COMMUNITY): Payer: BC Managed Care – PPO | Admitting: Occupational Therapy

## 2017-01-03 ENCOUNTER — Inpatient Hospital Stay (HOSPITAL_COMMUNITY): Payer: BC Managed Care – PPO

## 2017-01-03 ENCOUNTER — Inpatient Hospital Stay (INDEPENDENT_AMBULATORY_CARE_PROVIDER_SITE_OTHER): Payer: BC Managed Care – PPO | Admitting: Orthopaedic Surgery

## 2017-01-03 ENCOUNTER — Inpatient Hospital Stay (HOSPITAL_COMMUNITY)
Admission: AD | Admit: 2017-01-03 | Discharge: 2017-01-12 | DRG: 811 | Disposition: A | Discharge status: Home / Self Care | Payer: BC Managed Care – PPO | Source: Ambulatory Visit | Attending: Internal Medicine | Admitting: Internal Medicine

## 2017-01-03 DIAGNOSIS — E871 Hypo-osmolality and hyponatremia: Secondary | ICD-10-CM | POA: Diagnosis present

## 2017-01-03 DIAGNOSIS — N179 Acute kidney failure, unspecified: Secondary | ICD-10-CM | POA: Diagnosis present

## 2017-01-03 DIAGNOSIS — E43 Unspecified severe protein-calorie malnutrition: Secondary | ICD-10-CM | POA: Insufficient documentation

## 2017-01-03 DIAGNOSIS — R188 Other ascites: Secondary | ICD-10-CM

## 2017-01-03 DIAGNOSIS — K7689 Other specified diseases of liver: Secondary | ICD-10-CM | POA: Diagnosis not present

## 2017-01-03 DIAGNOSIS — K7460 Unspecified cirrhosis of liver: Secondary | ICD-10-CM | POA: Diagnosis present

## 2017-01-03 DIAGNOSIS — J45909 Unspecified asthma, uncomplicated: Secondary | ICD-10-CM | POA: Diagnosis present

## 2017-01-03 DIAGNOSIS — D696 Thrombocytopenia, unspecified: Secondary | ICD-10-CM

## 2017-01-03 DIAGNOSIS — R180 Malignant ascites: Secondary | ICD-10-CM

## 2017-01-03 DIAGNOSIS — L97524 Non-pressure chronic ulcer of other part of left foot with necrosis of bone: Secondary | ICD-10-CM

## 2017-01-03 DIAGNOSIS — E790 Hyperuricemia without signs of inflammatory arthritis and tophaceous disease: Secondary | ICD-10-CM | POA: Diagnosis not present

## 2017-01-03 DIAGNOSIS — K767 Hepatorenal syndrome: Secondary | ICD-10-CM | POA: Diagnosis present

## 2017-01-03 DIAGNOSIS — K766 Portal hypertension: Secondary | ICD-10-CM

## 2017-01-03 DIAGNOSIS — D638 Anemia in other chronic diseases classified elsewhere: Secondary | ICD-10-CM | POA: Diagnosis present

## 2017-01-03 DIAGNOSIS — L97504 Non-pressure chronic ulcer of other part of unspecified foot with necrosis of bone: Secondary | ICD-10-CM

## 2017-01-03 DIAGNOSIS — D631 Anemia in chronic kidney disease: Secondary | ICD-10-CM

## 2017-01-03 DIAGNOSIS — N183 Chronic kidney disease, stage 3 (moderate): Secondary | ICD-10-CM | POA: Diagnosis present

## 2017-01-03 DIAGNOSIS — Z79899 Other long term (current) drug therapy: Secondary | ICD-10-CM

## 2017-01-03 DIAGNOSIS — R5381 Other malaise: Secondary | ICD-10-CM

## 2017-01-03 DIAGNOSIS — K7210 Chronic hepatic failure without coma: Secondary | ICD-10-CM | POA: Diagnosis present

## 2017-01-03 DIAGNOSIS — I8500 Esophageal varices without bleeding: Secondary | ICD-10-CM | POA: Diagnosis present

## 2017-01-03 DIAGNOSIS — Z4781 Encounter for orthopedic aftercare following surgical amputation: Secondary | ICD-10-CM | POA: Diagnosis not present

## 2017-01-03 DIAGNOSIS — Z515 Encounter for palliative care: Secondary | ICD-10-CM | POA: Diagnosis not present

## 2017-01-03 DIAGNOSIS — M869 Osteomyelitis, unspecified: Secondary | ICD-10-CM | POA: Diagnosis present

## 2017-01-03 DIAGNOSIS — M8600 Acute hematogenous osteomyelitis, unspecified site: Secondary | ICD-10-CM | POA: Diagnosis not present

## 2017-01-03 DIAGNOSIS — I129 Hypertensive chronic kidney disease with stage 1 through stage 4 chronic kidney disease, or unspecified chronic kidney disease: Secondary | ICD-10-CM | POA: Diagnosis present

## 2017-01-03 DIAGNOSIS — D500 Iron deficiency anemia secondary to blood loss (chronic): Secondary | ICD-10-CM

## 2017-01-03 DIAGNOSIS — K2210 Ulcer of esophagus without bleeding: Secondary | ICD-10-CM | POA: Diagnosis present

## 2017-01-03 DIAGNOSIS — K279 Peptic ulcer, site unspecified, unspecified as acute or chronic, without hemorrhage or perforation: Secondary | ICD-10-CM | POA: Diagnosis present

## 2017-01-03 DIAGNOSIS — R64 Cachexia: Secondary | ICD-10-CM

## 2017-01-03 DIAGNOSIS — D61818 Other pancytopenia: Secondary | ICD-10-CM | POA: Diagnosis not present

## 2017-01-03 DIAGNOSIS — D62 Acute posthemorrhagic anemia: Secondary | ICD-10-CM | POA: Diagnosis present

## 2017-01-03 DIAGNOSIS — S88112A Complete traumatic amputation at level between knee and ankle, left lower leg, initial encounter: Secondary | ICD-10-CM | POA: Diagnosis not present

## 2017-01-03 DIAGNOSIS — E440 Moderate protein-calorie malnutrition: Secondary | ICD-10-CM | POA: Diagnosis present

## 2017-01-03 DIAGNOSIS — K7469 Other cirrhosis of liver: Secondary | ICD-10-CM | POA: Diagnosis present

## 2017-01-03 DIAGNOSIS — E1122 Type 2 diabetes mellitus with diabetic chronic kidney disease: Secondary | ICD-10-CM | POA: Diagnosis present

## 2017-01-03 DIAGNOSIS — E1151 Type 2 diabetes mellitus with diabetic peripheral angiopathy without gangrene: Secondary | ICD-10-CM | POA: Diagnosis present

## 2017-01-03 DIAGNOSIS — M79604 Pain in right leg: Secondary | ICD-10-CM | POA: Diagnosis not present

## 2017-01-03 DIAGNOSIS — D689 Coagulation defect, unspecified: Secondary | ICD-10-CM | POA: Diagnosis present

## 2017-01-03 DIAGNOSIS — R4589 Other symptoms and signs involving emotional state: Secondary | ICD-10-CM | POA: Diagnosis present

## 2017-01-03 DIAGNOSIS — N189 Chronic kidney disease, unspecified: Secondary | ICD-10-CM | POA: Diagnosis not present

## 2017-01-03 DIAGNOSIS — R197 Diarrhea, unspecified: Secondary | ICD-10-CM | POA: Diagnosis present

## 2017-01-03 DIAGNOSIS — K7682 Hepatic encephalopathy: Secondary | ICD-10-CM

## 2017-01-03 DIAGNOSIS — K7290 Hepatic failure, unspecified without coma: Secondary | ICD-10-CM

## 2017-01-03 DIAGNOSIS — Z7189 Other specified counseling: Secondary | ICD-10-CM | POA: Diagnosis not present

## 2017-01-03 DIAGNOSIS — R109 Unspecified abdominal pain: Secondary | ICD-10-CM | POA: Diagnosis not present

## 2017-01-03 DIAGNOSIS — K7200 Acute and subacute hepatic failure without coma: Secondary | ICD-10-CM | POA: Diagnosis present

## 2017-01-03 DIAGNOSIS — E11319 Type 2 diabetes mellitus with unspecified diabetic retinopathy without macular edema: Secondary | ICD-10-CM | POA: Diagnosis present

## 2017-01-03 DIAGNOSIS — D6959 Other secondary thrombocytopenia: Secondary | ICD-10-CM | POA: Diagnosis present

## 2017-01-03 DIAGNOSIS — E8770 Fluid overload, unspecified: Secondary | ICD-10-CM | POA: Diagnosis present

## 2017-01-03 DIAGNOSIS — Z961 Presence of intraocular lens: Secondary | ICD-10-CM | POA: Diagnosis present

## 2017-01-03 DIAGNOSIS — Z9841 Cataract extraction status, right eye: Secondary | ICD-10-CM

## 2017-01-03 DIAGNOSIS — R161 Splenomegaly, not elsewhere classified: Secondary | ICD-10-CM | POA: Diagnosis present

## 2017-01-03 DIAGNOSIS — Z89512 Acquired absence of left leg below knee: Secondary | ICD-10-CM

## 2017-01-03 DIAGNOSIS — Z6831 Body mass index (BMI) 31.0-31.9, adult: Secondary | ICD-10-CM

## 2017-01-03 DIAGNOSIS — R52 Pain, unspecified: Secondary | ICD-10-CM | POA: Diagnosis not present

## 2017-01-03 DIAGNOSIS — E11621 Type 2 diabetes mellitus with foot ulcer: Secondary | ICD-10-CM | POA: Diagnosis not present

## 2017-01-03 DIAGNOSIS — Z9842 Cataract extraction status, left eye: Secondary | ICD-10-CM

## 2017-01-03 HISTORY — DX: Personal history of other medical treatment: Z92.89

## 2017-01-03 HISTORY — DX: Anemia, unspecified: D64.9

## 2017-01-03 HISTORY — DX: Unspecified asthma, uncomplicated: J45.909

## 2017-01-03 HISTORY — PX: IR PARACENTESIS: IMG2679

## 2017-01-03 HISTORY — DX: Gastric ulcer, unspecified as acute or chronic, without hemorrhage or perforation: K25.9

## 2017-01-03 HISTORY — DX: Type 2 diabetes mellitus without complications: E11.9

## 2017-01-03 LAB — CULTURE, BODY FLUID W GRAM STAIN -BOTTLE: Culture: NO GROWTH

## 2017-01-03 LAB — GLUCOSE, CAPILLARY
GLUCOSE-CAPILLARY: 130 mg/dL — AB (ref 65–99)
GLUCOSE-CAPILLARY: 125 mg/dL — AB (ref 65–99)
GLUCOSE-CAPILLARY: 114 mg/dL — AB (ref 65–99)
GLUCOSE-CAPILLARY: 102 mg/dL — AB (ref 65–99)

## 2017-01-03 LAB — CULTURE, BODY FLUID-BOTTLE

## 2017-01-03 LAB — PROTIME-INR
INR: 2.2
Prothrombin Time: 24.8 seconds — ABNORMAL HIGH (ref 11.4–15.2)

## 2017-01-03 MED ORDER — SACCHAROMYCES BOULARDII 250 MG PO CAPS
250.0000 mg | ORAL_CAPSULE | Freq: Two times a day (BID) | ORAL | Status: DC
  Administered 2017-01-03 – 2017-01-12 (×16): 250 mg via ORAL
  Filled 2017-01-03 (×18): qty 1

## 2017-01-03 MED ORDER — INSULIN ASPART 100 UNIT/ML ~~LOC~~ SOLN
0.0000 [IU] | Freq: Every day | SUBCUTANEOUS | Status: DC
  Administered 2017-01-08: 2 [IU] via SUBCUTANEOUS

## 2017-01-03 MED ORDER — OXYCODONE HCL 5 MG PO TABS
5.0000 mg | ORAL_TABLET | ORAL | Status: DC | PRN
  Administered 2017-01-03 – 2017-01-11 (×3): 5 mg via ORAL
  Filled 2017-01-03 (×3): qty 1

## 2017-01-03 MED ORDER — SODIUM CHLORIDE 0.9% FLUSH
3.0000 mL | Freq: Two times a day (BID) | INTRAVENOUS | Status: DC
  Administered 2017-01-03 – 2017-01-11 (×16): 3 mL via INTRAVENOUS

## 2017-01-03 MED ORDER — LIDOCAINE HCL 1 % IJ SOLN
INTRAMUSCULAR | Status: AC
  Filled 2017-01-03: qty 20

## 2017-01-03 MED ORDER — ADULT MULTIVITAMIN W/MINERALS CH
1.0000 | ORAL_TABLET | Freq: Every day | ORAL | Status: DC
  Administered 2017-01-04 – 2017-01-12 (×8): 1 via ORAL
  Filled 2017-01-03 (×9): qty 1

## 2017-01-03 MED ORDER — TRAMADOL HCL 50 MG PO TABS
50.0000 mg | ORAL_TABLET | Freq: Two times a day (BID) | ORAL | Status: DC | PRN
  Administered 2017-01-03: 50 mg via ORAL
  Filled 2017-01-03: qty 1

## 2017-01-03 MED ORDER — PANTOPRAZOLE SODIUM 40 MG PO TBEC
40.0000 mg | DELAYED_RELEASE_TABLET | Freq: Two times a day (BID) | ORAL | Status: DC
  Administered 2017-01-03 – 2017-01-12 (×16): 40 mg via ORAL
  Filled 2017-01-03 (×18): qty 1

## 2017-01-03 MED ORDER — ENSURE ENLIVE PO LIQD: 237.0000 mL | Freq: Two times a day (BID) | ORAL | Status: DC

## 2017-01-03 MED ORDER — SODIUM CHLORIDE 0.9 % IV SOLN: Freq: Once | INTRAVENOUS | Status: DC

## 2017-01-03 MED ORDER — METOCLOPRAMIDE HCL 5 MG PO TABS
5.0000 mg | ORAL_TABLET | Freq: Four times a day (QID) | ORAL | Status: DC | PRN
  Administered 2017-01-04 – 2017-01-09 (×5): 5 mg via ORAL
  Filled 2017-01-03 (×6): qty 1

## 2017-01-03 MED ORDER — INSULIN ASPART 100 UNIT/ML ~~LOC~~ SOLN
0.0000 [IU] | Freq: Three times a day (TID) | SUBCUTANEOUS | Status: DC
  Administered 2017-01-04: 1 [IU] via SUBCUTANEOUS
  Administered 2017-01-05 (×2): 2 [IU] via SUBCUTANEOUS
  Administered 2017-01-06 (×2): 1 [IU] via SUBCUTANEOUS
  Administered 2017-01-08 – 2017-01-09 (×2): 2 [IU] via SUBCUTANEOUS
  Administered 2017-01-09 (×2): 1 [IU] via SUBCUTANEOUS

## 2017-01-03 MED ORDER — RIFAXIMIN 550 MG PO TABS
550.0000 mg | ORAL_TABLET | Freq: Two times a day (BID) | ORAL | Status: DC
  Administered 2017-01-03 – 2017-01-12 (×16): 550 mg via ORAL
  Filled 2017-01-03 (×18): qty 1

## 2017-01-03 MED ORDER — MIDODRINE HCL 5 MG PO TABS
15.0000 mg | ORAL_TABLET | Freq: Three times a day (TID) | ORAL | Status: DC
  Administered 2017-01-04 – 2017-01-12 (×26): 15 mg via ORAL
  Filled 2017-01-03 (×26): qty 3

## 2017-01-03 MED ORDER — LIDOCAINE HCL 1 % IJ SOLN
INTRAMUSCULAR | Status: DC | PRN
  Administered 2017-01-03: 10 mL

## 2017-01-03 MED ORDER — LACTULOSE 10 GM/15ML PO SOLN
10.0000 g | Freq: Three times a day (TID) | ORAL | Status: DC
  Filled 2017-01-03 (×2): qty 15

## 2017-01-03 NOTE — Consult Note (Signed)
[facility] CONSULT NOTE  Patient Care Team: Adrian Prince, MD as PCP - General (Endocrinology)  CHIEF COMPLAINTS/PURPOSE OF CONSULTATION:  Progressive pancytopenia, coagulopathy  HISTORY OF PRESENTING ILLNESS:  Jeffery [name] 66 y.o. 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 He was last seen by myself on 12/04/16. At that time, his CBC showed WBC of 9.4, hemoglobin of 10.2 and platelet of 115. His serum creatinine was 1.6 with elevated total bilirubin of 3.14.  He is currently hospitalized since 12/18/16 for management of acute on chronic renal failure, osteomyelitis, hepatic and metabolic encephalopathy, electrolyte imbalances and pancytopenia. He was subsequently transferred to CIR for rehab effort since 12/27/16  Since hospitalization, he had received antimicrobial treatment, multiple courses of blood transfusions for ongoing bleeding from his left leg stump, recurrent therapeutic paracentesis and electrolyte replacement therapy.  Currently, he has chronic ongoing stump bleeding. INR is elevated at 2.2, APTT elevated at 83 seconds, creatinine of 3.33, CBC last drawn showed WBC of 5.6, hemoglobin of 8.0 and platelet count at 67,000.  Trial of vitamin K did not stop the bleeding. He needed stump dressing changes twice, soaked with serosanguinous fluids.  The patient denies any recent signs or symptoms of bleeding such as spontaneous epistaxis, hematuria or hematochezia. He is weak, unable to fully participate in rehabilitation. He also have chronic diarrhea. His abdomen is still distended causing major discomfort. He had another paracentesis for therapeutic relief today. His appetite is very poor. He is not eating due to  lack of appetite. He stated he has very poor urine output. He has been getting in and out catheterization 34 times a day and each time he got catheterized, urine output only average about 100-200 mL  MEDICAL HISTORY:  Past Medical History:  Diagnosis Date  . Arthritis   . Asthma   . Bilateral cataracts   . Blurred vision    improved now  . Cholelithiasis   . Diabetes mellitus    type II  . Eczema    inner wrist  . Hypertension   . Muscle cramps   . Necrotizing fasciitis (HCC)   . Neuropathy   . Rash    wrist and hands since late june  . Retinal degeneration     SURGICAL HISTORY: Past Surgical History:  Procedure Laterality Date  . ABOVE KNEE LEG AMPUTATION     right  . AMPUTATION Left 12/23/2016   Procedure: AMPUTATION BELOW KNEE;  Surgeon: Eldred Manges, MD;  Location: [facility] OR;  Service: Orthopedics;  Laterality: Left;  . APPLICATION OF WOUND VAC Left 11/06/2016   Procedure: APPLICATION OF WOUND VAC;  Surgeon: Eldred Manges, MD;  Location: [facility] OR;  Service: Orthopedics;  Laterality: Left;  . COLONOSCOPY WITH PROPOFOL N/A 05/18/2016   Procedure: COLONOSCOPY WITH PROPOFOL;  Surgeon: Rachael Fee, MD;  Location: [facility] ENDOSCOPY;  Service: Endoscopy;  Laterality: N/A;  . ESOPHAGOGASTRODUODENOSCOPY (EGD) WITH PROPOFOL N/A 05/18/2016   Procedure: ESOPHAGOGASTRODUODENOSCOPY (EGD) WITH PROPOFOL;  Surgeon: Rachael Fee, MD;  Location: [facility] ENDOSCOPY;  Service: Endoscopy;  Laterality: N/A;  . ESOPHAGOGASTRODUODENOSCOPY (EGD) WITH PROPOFOL N/A 11/01/2016   Procedure: ESOPHAGOGASTRODUODENOSCOPY (EGD) WITH PROPOFOL;  Surgeon: Napoleon Form, MD;  Location: [facility] ENDOSCOPY;  Service: Endoscopy;  Laterality: N/A;  . EYE SURGERY  09/2011  bilateral for retinopathy - laser eye surgery  . I&D EXTREMITY Left 10/25/2016   Procedure: LEFT FOOT DEBRIDEMENT RAY AMPUTATION PLACEMENT OF WOUND VAC;  Surgeon: Eldred Manges, MD;  Location: [facility] OR;  Service: Orthopedics;  Laterality: Left;  . I&D EXTREMITY Left  11/06/2016   Procedure: IRRIGATION AND DEBRIDEMENT EXTREMITY/LEFT FOOT;  Surgeon: Eldred Manges, MD;  Location: [facility] OR;  Service: Orthopedics;  Laterality: Left;  . IR PARACENTESIS  12/20/2016  . IR PARACENTESIS  12/29/2016  . IR PARACENTESIS  01/03/2017  . left metatarsal  2003   5th  . TOE AMPUTATION     Left big toe    SOCIAL HISTORY: Social History   Social History  . Marital status: Married    Spouse name: N/A  . Number of children: N/A  . Years of education: N/A   Occupational History  . Not on file.   Social History Main Topics  . Smoking status: Never Smoker  . Smokeless tobacco: Never Used  . Alcohol use No  . Drug use: No  . Sexual activity: Not on file     Comment: computer work. next of kin. Sister Marisue Humble next of kin   Other Topics Concern  . Not on file   Social History Narrative  . No narrative on file    FAMILY HISTORY: Family History  Problem Relation Age of Onset  . Cancer Mother     unknown type    ALLERGIES:  has No Known Allergies.  MEDICATIONS:  Current Facility-Administered Medications  Medication Dose Route Frequency Provider Last Rate Last Dose  . acetaminophen (TYLENOL) tablet 325-650 mg  325-650 mg Oral Q4H PRN Jacquelynn Cree, PA-C   650 mg at 01/03/17 0053  . bisacodyl (DULCOLAX) suppository 10 mg  10 mg Rectal Daily PRN [name] Love, PA-C      . brimonidine (ALPHAGAN) 0.15 % ophthalmic solution 1 drop  1 drop Both Eyes Daily Jacquelynn Cree, PA-C   1 drop at 01/03/17 0740  . diphenhydrAMINE (BENADRYL) 12.5 MG/5ML elixir 12.5-25 mg  12.5-25 mg Oral Q6H PRN [name] Love, PA-C      . feeding supplement (ENSURE ENLIVE) (ENSURE ENLIVE) liquid 237 mL  237 mL Oral TID BM Erick Colace, MD   237 mL at 01/02/17 1930  . feeding supplement (PRO-STAT SUGAR FREE 64) liquid 30 mL  30 mL Oral BID [name] Love, PA-C   30 mL at 01/01/17 2140  . guaiFENesin-dextromethorphan (ROBITUSSIN DM) 100-10 MG/5ML syrup 5-10 mL  5-10 mL Oral Q6H PRN Jacquelynn Cree, PA-C      . insulin aspart (novoLOG) injection 0-5 Units  0-5 Units Subcutaneous QHS Pamela S Love, PA-C      . insulin aspart (novoLOG) injection 0-9 Units  0-9 Units Subcutaneous TID WC Jacquelynn Cree, PA-C   1 Units at 01/03/17 1139  . ipratropium-albuterol (DUONEB) 0.5-2.5 (3) MG/3ML nebulizer solution 3 mL  3 mL Nebulization Q4H PRN [name] Love, PA-C      . lidocaine (XYLOCAINE) 1 % (with pres) injection    PRN Hoyt Koch, PA   10 mL at 01/03/17 1442  . lidocaine (XYLOCAINE) 2 % jelly 1 application  1 application Topical PRN Erick Colace, MD   1 application at 12/30/16 2159  . methocarbamol (ROBAXIN) tablet 250 mg  250 mg Oral Q8H PRN Jacquelynn Cree, PA-C   250 mg at 01/03/17 0053  . midodrine (PROAMATINE) tablet 15 mg  15  mg Oral TID WC [name] Love, PA-C   15 mg at 01/03/17 1138  . pantoprazole (PROTONIX) EC tablet 40 mg  40 mg Oral BID Jacquelynn Cree, PA-C   40 mg at 01/03/17 0741  . polyethylene glycol (MIRALAX / GLYCOLAX) packet 17 g  17 g Oral Daily PRN Jacquelynn Cree, PA-C      . promethazine (PHENERGAN) tablet 6.25 mg  6.25 mg Oral Q6H PRN Erick Colace, MD   6.25 mg at 12/31/16 2122   Or  . promethazine (PHENERGAN) suppository 6.25 mg  6.25 mg Rectal Q6H PRN Erick Colace, MD       Or  . promethazine (PHENERGAN) injection 6.25 mg  6.25 mg Intramuscular Q6H PRN Erick Colace, MD   6.25 mg at 01/01/17 1056  . protein supplement (RESOURCE BENEPROTEIN) powder 6 g  1 scoop Oral TID WC [name] Love, PA-C   6 g at 01/02/17 1712  . rifaximin (XIFAXAN) tablet 550 mg  550 mg Oral BID Jacquelynn Cree, PA-C   550 mg at 01/03/17 0741  . sucralfate (CARAFATE) 1 GM/10ML suspension 1 g  1 g Oral TID WC & HS [name] Love, PA-C   1 g at 01/03/17 1138  . traMADol (ULTRAM) tablet 50 mg  50 mg Oral Q12H PRN Erick Colace, MD   50 mg at 01/03/17 1601  . traZODone (DESYREL) tablet 25-50 mg  25-50 mg Oral QHS PRN Jacquelynn Cree, PA-C        REVIEW OF SYSTEMS:    Constitutional: Denies fevers, chills or abnormal night sweats Eyes: Denies blurriness of vision, double vision or watery eyes Ears, nose, mouth, throat, and face: Denies mucositis or sore throat Respiratory: Denies cough, dyspnea or wheezes Cardiovascular: Denies palpitation, chest discomfort or lower extremity swelling Skin: Denies abnormal skin rashes Lymphatics: Denies new lymphadenopathy  Behavioral/Psych: Mood is stable, no new changes  All other systems were reviewed with the patient and are negative.  PHYSICAL EXAMINATION: ECOG PERFORMANCE STATUS: 4 - Bedbound  Vitals:   01/03/17 1403 01/03/17 1432  BP: 111/69 (!) 112/50  Pulse: 94 91  Resp: 18 18  Temp:     Filed Weights   01/02/17 0438 01/03/17 0600 01/03/17 1332  Weight: 233 lb 11 oz (106 kg) 239 lb 8 oz (108.6 kg) 245 lb 14.4 oz (111.5 kg)    GENERAL:alert, no distress and comfortable. He looks very pale. He has signs of protein calorie malnutrition with temporal muscle wasting SKIN: Noted pale skin and extensive bruises EYES: normal, conjunctiva are pale and non-injected, sclera clear OROPHARYNX:no exudate, no erythema and lips, buccal mucosa, and tongue normal  NECK: supple, thyroid normal size, non-tender, without nodularity LYMPH:  no palpable lymphadenopathy in the cervical, axillary or inguinal LUNGS: clear to auscultation and percussion with normal breathing effort HEART: regular rate & rhythm and no murmurs. Noted no evidence of oozing from the left leg stump ABDOMEN:abdomen soft, grossly distended with ascitic fluid Musculoskeletal:no cyanosis of digits and no clubbing  PSYCH: alert & oriented x 3 with fluent speech NEURO: no focal motor/sensory deficits  LABORATORY DATA:  I have reviewed the data as listed Lab Results  Component Value Date   WBC 5.6 01/02/2017   HGB 8.0 (L) 01/02/2017   HCT 23.1 (L) 01/02/2017   MCV 95.5 01/02/2017   PLT 67 (L) 01/02/2017   Now the recent hepatic panel and serum  kidney function  RADIOGRAPHIC STUDIES: I have personally reviewed the  radiological images as listed and agreed with the findings in the report. Dg Chest 2 View  Result Date: 01/01/2017 CLINICAL DATA:  Persistent cough. EXAM: CHEST  2 VIEW COMPARISON:  12/18/2016 FINDINGS: AP and lateral views of the chest show markedly low lung volumes without pulmonary edema or pleural effusion. Probable subsegmental atelectasis right base is new in the 2 week interval since prior study. The cardiopericardial silhouette is within normal limits for size. The visualized bony structures of the thorax are intact. IMPRESSION: New right basilar opacity suggests atelectasis. Low lung volumes. Electronically Signed   By: Kennith Center M.D.   On: 01/01/2017 17:38   US Renal  Result Date: 12/19/2016 CLINICAL DATA:  Acute kidney injury EXAM: RENAL / URINARY TRACT ULTRASOUND COMPLETE COMPARISON:  None. FINDINGS: Right Kidney: Length: 11.3 cm. Increased parenchymal echogenicity consistent with medical renal disease. 2 cm cyst at the lateral midpole region. No hydronephrosis. Left Kidney: Length: 12.8 cm. Increased parenchymal echogenicity consistent with medical renal disease. 4.9 cm cyst at the lower pole. 1.4 cm calculus in the midpole collecting system. No hydronephrosis. Bladder: The urinary bladder is empty. Moderate volume peritoneal ascites. IMPRESSION: 1. Increased renal parenchymal echogenicity consistent with medical renal disease. No hydronephrosis. 2. Left midpole calcification may represent a 14 mm collecting system calculus. No obstructing calculi. 3. Peritoneal ascites. Electronically Signed   By: Ellery Plunk M.D.   On: 12/19/2016 03:37   Mr Foot Left Wo Contrast  Result Date: 12/22/2016 CLINICAL DATA:  Diabetic foot ulcer.  History of prior amputation. EXAM: MRI OF THE LEFT FOOT WITHOUT CONTRAST TECHNIQUE: Multiplanar, multisequence MR imaging of the MRI 10/24/2016 was performed. No intravenous contrast was  administered. COMPARISON:  MRI 10/24/2016 FINDINGS: Bones/Joint/Cartilage Status post amputation of the first ray back to the proximal first metatarsal. Signal abnormality in the base of metatarsal is worrisome for osteomyelitis. There is also osteomyelitis involving the second metatarsal and proximal phalanx with marked subluxation and possible septic arthritis. There is also an open wound on the plantar aspect of the forefoot overlying the region of the second metatarsal head. Diffuse cellulitis and myofasciitis. The irregular fluid collection along the medial aspect of the second metatarsal and proximal phalanx suspicious for abscess. The The tibiotalar and subtalar joints are maintained. Small joint effusions. No findings for mid or hindfoot osteomyelitis. IMPRESSION: MR findings consistent with osteomyelitis involving the second metatarsal and second proximal phalanx with probable intervening septic arthritis. There is also cellulitis and myofasciitis and a small abscess along the medial aspect of the second ray. Status post amputation of the first metatarsal. Signal abnormality in the base of the metatarsal could be postsurgical or due to osteomyelitis. Electronically Signed   By: Rudie Meyer M.D.   On: 12/22/2016 16:33   Dg Chest Portable 1 View  Result Date: 12/18/2016 CLINICAL DATA:  Dyspnea are EXAM: PORTABLE CHEST 1 VIEW COMPARISON:  None. FINDINGS: Shallow lung inflation. The heart size and mediastinal contours are within normal limits. Both lungs are clear. The visualized skeletal structures are unremarkable. IMPRESSION: No active disease. Electronically Signed   By: Deatra Robinson M.D.   On: 12/18/2016 19:48   Dg Abd Portable 1v  Result Date: 12/19/2016 CLINICAL DATA:  Abdominal pain today. EXAM: PORTABLE ABDOMEN - 1 VIEW COMPARISON:  Plain films the abdomen 10/30/2016. CT abdomen and pelvis 10/23/2016. FINDINGS: The bowel gas pattern is normal. No radio-opaque calculi or other significant  radiographic abnormality are seen. IMPRESSION: Negative exam. Electronically Signed   By: Drusilla Kanner  M.D.   On: 12/19/2016 12:35   Xr Foot Complete Left  Result Date: 12/14/2016 Review x-rays left foot obtained that shows first ray amputation no evidence of osteomyelitis in the proximal portion of the first metatarsal. He has subluxation of the metatarsal phalangeal joint second third fourth. Patient has some soft tissue swelling on x-ray but no evidence of bone infection. Impression status post first ray amputation left foot. No changes suggestive of osteomyelitis currently.  Ir Paracentesis  Result Date: 01/03/2017 INDICATION: Patient with recurrent ascites. Request is made for therapeutic paracentesis of up to 1.5 liters. EXAM: ULTRASOUND GUIDED THERAPEUTIC PARACENTESIS MEDICATIONS: 10 mL 1% lidocaine COMPLICATIONS: None immediate. PROCEDURE: Informed written consent was obtained from the patient after a discussion of the risks, benefits and alternatives to treatment. A timeout was performed prior to the initiation of the procedure. Initial ultrasound scanning demonstrates a large amount of ascites within the right lateral abdomen. The right lateral abdomen was prepped and draped in the usual sterile fashion. 1% lidocaine was used for local anesthesia. Following this, a 19 gauge, 7-cm, Yueh catheter was introduced. An ultrasound image was saved for documentation purposes. The paracentesis was performed. The catheter was removed and a dressing was applied. The patient tolerated the procedure well without immediate post procedural complication. FINDINGS: A total of approximately 1.5 liters of clear, yellow fluid was removed. Samples were sent to the laboratory as requested by the clinical team. IMPRESSION: Successful ultrasound-guided paracentesis yielding 1.5 liters of peritoneal fluid. Read by:  Loyce Dys PA-C Electronically Signed   By: Jolaine Click M.D.   On: 01/03/2017 16:14   Ir  Paracentesis  Result Date: 12/29/2016 INDICATION: Patient with recurrent ascites. Request is made for diagnostic and therapeutic paracentesis. Patient has a 1 liter maximum. EXAM: ULTRASOUND GUIDED DIAGNOSTIC AND THERAPEUTIC PARACENTESIS MEDICATIONS: 10 mL 1% lidocaine COMPLICATIONS: None immediate. PROCEDURE: Informed written consent was obtained from the patient after a discussion of the risks, benefits and alternatives to treatment. A timeout was performed prior to the initiation of the procedure. Initial ultrasound scanning demonstrates a large amount of ascites within the left lateral abdomen. The left lateral abdomen was prepped and draped in the usual sterile fashion. 1% lidocaine was used for local anesthesia. Following this, a 19 gauge, 10-cm, Yueh catheter was introduced. An ultrasound image was saved for documentation purposes. The paracentesis was performed. The catheter was removed and a dressing was applied. The patient tolerated the procedure well without immediate post procedural complication. FINDINGS: A total of approximately 1.0 liters of clear, yellow fluid was removed. Samples were sent to the laboratory as requested by the clinical team. IMPRESSION: Successful ultrasound-guided paracentesis yielding 1.0 liters of peritoneal fluid. Read by:  Loyce Dys PA-C Electronically Signed   By: Simonne Come M.D.   On: 12/29/2016 13:33   Ir Paracentesis  Result Date: 12/20/2016 INDICATION: Recurrent ascites EXAM: ULTRASOUND-GUIDED PARACENTESIS COMPARISON:  Previous paracentesis MEDICATIONS: 10 cc 1% lidocaine COMPLICATIONS: None immediate. TECHNIQUE: Informed written consent was obtained from the patient after a discussion of the risks, benefits and alternatives to treatment. A timeout was performed prior to the initiation of the procedure. Initial ultrasound scanning demonstrates a large amount of ascites within the left lower abdominal quadrant. The left lower abdomen was prepped and draped in  the usual sterile fashion. 1% lidocaine with epinephrine was used for local anesthesia. Under direct ultrasound guidance, a 19 gauge, 7-cm, Yueh catheter was introduced. An ultrasound image was saved for documentation purposed. The paracentesis was performed. The  catheter was removed and a dressing was applied. The patient tolerated the procedure well without immediate post procedural complication. FINDINGS: A total of approximately 50 cc only of yellow fluid was removed. Samples were sent to the laboratory as requested by the clinical team. IMPRESSION: Successful ultrasound-guided paracentesis yielding 50 cc only of peritoneal fluid. Diagnostic only per MD. 50 cc only. Read by Robet Leu [facility] Electronically Signed   By: Judie Petit.  Shick M.D.   On: 12/20/2016 15:14    ASSESSMENT & PLAN  Anemia of chronic illness and secondary to chronic blood loss and chronic kidney disease He has multifactorial anemia. 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, end-stage liver cirrhosis I recommend gentle transfusion support with fresh frozen plasma However, his urine output needs to be monitored closely to avoid complication related to volume overload Vitamin K is unlikely going to work given significant synthetic liver dysfunction I recommend 2 units of FFP once telemetric he is established to night followed by daily coagulation study The goal would be to try to keep INR closer to 1.5 and below He may benefit from cryoprecipitate transfusion if Fibrinogen level is less than 200  Recurrent  ascites secondary to portal hypertension and liver failure The patient had evidence of third spacing and progressive liver failure Recommend close follow-up by GI  Acute on chronic renal failure The patient have clinical chronic kidney disease stage IV The improvement of creatinine function is likely not because of kidney function improvement, rather likely secondary to muscle wasting The patient has intravascular volume depletion likely secondary to third spacing and poor oral intake Rather than IV fluid resuscitation, I think he would benefit greatly with intravascular volume expansion with transfusion of blood products  History of osteomyelitis Continue wound dressing for now  Severe protein calorie malnutrition and severe cachexia Recommend full dietitian consult and consideration for appetite stimulant  Goals of care discussion The patient desires full code I felt that he is unrealistic; He has multiorgan failure with renal failure, liver failure, bone marrow failure and severe protein calorie malnutrition. He has multiple, recurrent hospitalization because of osteomyelitis and other medical complications I think once his symptoms have improved and bleeding had stopped, I think is reasonable to consult palliative care team for goals of care discussion  Discharge planning I have discussed this with the primary service I have also discussed his case with the hospitalist on-call with request to transfer him out of the CIR to an acute bed for aggressive management of all his medical issues I will follow closely  All questions were answered. The patient knows to call the clinic with any problems, questions or concerns. No barriers to learning was detected. I spent 60 minutes counseling the patient face to face. The total time spent in the appointment was 80 minutes and more than 50% was on counseling and review of test results     Artis Delay, MD 01/03/2017 5:10 PM

## 2017-01-03 NOTE — Discharge Summary (Signed)
Physician Discharge Summary  Patient ID: Tavonte Seybold MRN: [id number] DOB/AGE: 03-25-1951 66 y.o.  Admit date: 12/27/2016 Discharge date: 01/03/2017  Discharge Diagnoses:  Principal Problem:   Unilateral complete BKA, left, initial encounter [facility]) Active Problems:   Coagulopathy (HCC)   Cirrhosis of liver with ascites (HCC)   General weakness   Diarrhea   Acute renal failure (HCC)   Abdominal pain   Moderate malnutrition (HCC)   Hypotension   Pain   Discharged Condition: Guarded.   Significant Diagnostic Studies: Dg Chest 2 View  Result Date: 01/01/2017 CLINICAL DATA:  Persistent cough. EXAM: CHEST  2 VIEW COMPARISON:  12/18/2016 FINDINGS: AP and lateral views of the chest show markedly low lung volumes without pulmonary edema or pleural effusion. Probable subsegmental atelectasis right base is new in the 2 week interval since prior study. The cardiopericardial silhouette is within normal limits for size. The visualized bony structures of the thorax are intact. IMPRESSION: New right basilar opacity suggests atelectasis. Low lung volumes. Electronically Signed   By: Kennith Center M.D.   On: 01/01/2017 17:38   US Renal  Result Date: 12/19/2016 CLINICAL DATA:  Acute kidney injury EXAM: RENAL / URINARY TRACT ULTRASOUND COMPLETE COMPARISON:  None. FINDINGS: Right Kidney: Length: 11.3 cm. Increased parenchymal echogenicity consistent with medical renal disease. 2 cm cyst at the lateral midpole region. No hydronephrosis. Left Kidney: Length: 12.8 cm. Increased parenchymal echogenicity consistent with medical renal disease. 4.9 cm cyst at the lower pole. 1.4 cm calculus in the midpole collecting system. No hydronephrosis. Bladder: The urinary bladder is empty. Moderate volume peritoneal ascites. IMPRESSION: 1. Increased renal parenchymal echogenicity consistent with medical renal disease. No hydronephrosis. 2. Left midpole calcification may represent a 14 mm collecting system calculus. No  obstructing calculi. 3. Peritoneal ascites. Electronically Signed   By: Ellery Plunk M.D.   On: 12/19/2016 03:37   Mr Foot Left Wo Contrast  Result Date: 12/22/2016 CLINICAL DATA:  Diabetic foot ulcer.  History of prior amputation. EXAM: MRI OF THE LEFT FOOT WITHOUT CONTRAST TECHNIQUE: Multiplanar, multisequence MR imaging of the MRI 10/24/2016 was performed. No intravenous contrast was administered. COMPARISON:  MRI 10/24/2016 FINDINGS: Bones/Joint/Cartilage Status post amputation of the first ray back to the proximal first metatarsal. Signal abnormality in the base of metatarsal is worrisome for osteomyelitis. There is also osteomyelitis involving the second metatarsal and proximal phalanx with marked subluxation and possible septic arthritis. There is also an open wound on the plantar aspect of the forefoot overlying the region of the second metatarsal head. Diffuse cellulitis and myofasciitis. The irregular fluid collection along the medial aspect of the second metatarsal and proximal phalanx suspicious for abscess. The The tibiotalar and subtalar joints are maintained. Small joint effusions. No findings for mid or hindfoot osteomyelitis. IMPRESSION: MR findings consistent with osteomyelitis involving the second metatarsal and second proximal phalanx with probable intervening septic arthritis. There is also cellulitis and myofasciitis and a small abscess along the medial aspect of the second ray. Status post amputation of the first metatarsal. Signal abnormality in the base of the metatarsal could be postsurgical or due to osteomyelitis. Electronically Signed   By: Rudie Meyer M.D.   On: 12/22/2016 16:33   Dg Chest Portable 1 View  Result Date: 12/18/2016 CLINICAL DATA:  Dyspnea are EXAM: PORTABLE CHEST 1 VIEW COMPARISON:  None. FINDINGS: Shallow lung inflation. The heart size and mediastinal contours are within normal limits. Both lungs are clear. The visualized skeletal structures are  unremarkable. IMPRESSION: No active disease.  Electronically Signed   By: Deatra Robinson M.D.   On: 12/18/2016 19:48   Dg Abd Portable 1v  Result Date: 12/19/2016 CLINICAL DATA:  Abdominal pain today. EXAM: PORTABLE ABDOMEN - 1 VIEW COMPARISON:  Plain films the abdomen 10/30/2016. CT abdomen and pelvis 10/23/2016. FINDINGS: The bowel gas pattern is normal. No radio-opaque calculi or other significant radiographic abnormality are seen. IMPRESSION: Negative exam. Electronically Signed   By: Drusilla Kanner M.D.   On: 12/19/2016 12:35   Xr Foot Complete Left  Result Date: 12/14/2016 Review x-rays left foot obtained that shows first ray amputation no evidence of osteomyelitis in the proximal portion of the first metatarsal. He has subluxation of the metatarsal phalangeal joint second third fourth. Patient has some soft tissue swelling on x-ray but no evidence of bone infection. Impression status post first ray amputation left foot. No changes suggestive of osteomyelitis currently.  Ir Paracentesis  Result Date: 01/03/2017 INDICATION: Patient with recurrent ascites. Request is made for therapeutic paracentesis of up to 1.5 liters. EXAM: ULTRASOUND GUIDED THERAPEUTIC PARACENTESIS MEDICATIONS: 10 mL 1% lidocaine COMPLICATIONS: None immediate. PROCEDURE: Informed written consent was obtained from the patient after a discussion of the risks, benefits and alternatives to treatment. A timeout was performed prior to the initiation of the procedure. Initial ultrasound scanning demonstrates a large amount of ascites within the right lateral abdomen. The right lateral abdomen was prepped and draped in the usual sterile fashion. 1% lidocaine was used for local anesthesia. Following this, a 19 gauge, 7-cm, Yueh catheter was introduced. An ultrasound image was saved for documentation purposes. The paracentesis was performed. The catheter was removed and a dressing was applied. The patient tolerated the procedure well  without immediate post procedural complication. FINDINGS: A total of approximately 1.5 liters of clear, yellow fluid was removed. Samples were sent to the laboratory as requested by the clinical team. IMPRESSION: Successful ultrasound-guided paracentesis yielding 1.5 liters of peritoneal fluid. Read by:  Loyce Dys PA-C Electronically Signed   By: Jolaine Click M.D.   On: 01/03/2017 16:14   Ir Paracentesis  Result Date: 12/29/2016 INDICATION: Patient with recurrent ascites. Request is made for diagnostic and therapeutic paracentesis. Patient has a 1 liter maximum. EXAM: ULTRASOUND GUIDED DIAGNOSTIC AND THERAPEUTIC PARACENTESIS MEDICATIONS: 10 mL 1% lidocaine COMPLICATIONS: None immediate. PROCEDURE: Informed written consent was obtained from the patient after a discussion of the risks, benefits and alternatives to treatment. A timeout was performed prior to the initiation of the procedure. Initial ultrasound scanning demonstrates a large amount of ascites within the left lateral abdomen. The left lateral abdomen was prepped and draped in the usual sterile fashion. 1% lidocaine was used for local anesthesia. Following this, a 19 gauge, 10-cm, Yueh catheter was introduced. An ultrasound image was saved for documentation purposes. The paracentesis was performed. The catheter was removed and a dressing was applied. The patient tolerated the procedure well without immediate post procedural complication. FINDINGS: A total of approximately 1.0 liters of clear, yellow fluid was removed. Samples were sent to the laboratory as requested by the clinical team. IMPRESSION: Successful ultrasound-guided paracentesis yielding 1.0 liters of peritoneal fluid. Read by:  Loyce Dys PA-C Electronically Signed   By: Simonne Come M.D.   On: 12/29/2016 13:33   Ir Paracentesis  Result Date: 12/20/2016 INDICATION: Recurrent ascites EXAM: ULTRASOUND-GUIDED PARACENTESIS COMPARISON:  Previous paracentesis MEDICATIONS: 10 cc 1%  lidocaine COMPLICATIONS: None immediate. TECHNIQUE: Informed written consent was obtained from the patient after a discussion of the risks, benefits and  alternatives to treatment. A timeout was performed prior to the initiation of the procedure. Initial ultrasound scanning demonstrates a large amount of ascites within the left lower abdominal quadrant. The left lower abdomen was prepped and draped in the usual sterile fashion. 1% lidocaine with epinephrine was used for local anesthesia. Under direct ultrasound guidance, a 19 gauge, 7-cm, Yueh catheter was introduced. An ultrasound image was saved for documentation purposed. The paracentesis was performed. The catheter was removed and a dressing was applied. The patient tolerated the procedure well without immediate post procedural complication. FINDINGS: A total of approximately 50 cc only of yellow fluid was removed. Samples were sent to the laboratory as requested by the clinical team. IMPRESSION: Successful ultrasound-guided paracentesis yielding 50 cc only of peritoneal fluid. Diagnostic only per MD. 50 cc only. Read by Robet Leu [facility] Electronically Signed   By: Judie Petit.  Shick M.D.   On: 12/20/2016 15:14    Labs:  Basic Metabolic Panel:  Recent Labs Lab 12/29/16 1032 12/31/16 0503 01/01/17 0256 01/02/17 0636  NA 130* 132* 132* 132*  K 4.3 4.6 4.4 4.6  CL 103 103 104 104  CO2 19* 23 22 21*  GLUCOSE 145* 137* 117* 91  BUN 49* 56* 54* 57*  CREATININE 2.84* 3.23* 3.35* 3.33*  CALCIUM 8.2* 8.3* 8.1* 8.2*  PHOS 3.0 3.7  --   --     CBC:  Recent Labs Lab 12/29/16 1131 12/31/16 0503 01/02/17 0636  WBC 5.0 3.9* 5.6  NEUTROABS 2.6  --   --   HGB 7.7* 7.2* 8.0*  HCT 23.2* 21.4* 23.1*  MCV 98.3 96.4 95.5  PLT 68* 54* 67*    CBG:  Recent Labs Lab 01/02/17 1627 01/02/17 2123 01/03/17 0643 01/03/17 1122 01/03/17 1636  GLUCAP 155* 161* 130* 125* 114*    Brief HPI:   Caiden Flurchickis a 65 y.o.malewith history of HTN, T2DM  with neuropathy, End stage liver disease, R-AKA, 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 History taken from chart review and patient. He was discharged to SNF for rehab but was readmitted on 12/18/16 with N/V,weakness and acute renal failure. He was treated with IVF and started on IV antibiotics as reports of recent staph infection. MRI of foot reviewed, inflammation of tissue and bone. Per report, osteomyelitis with septic arthritis and myofasciitis. He underwent paracentesis of 50 cc yellow fluid on 4/4 for recurrent ascites.  Nephrology consulted for input on acute on chronic renal failure and felt that renal failure multifactorial and is following for input.  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 nephrology Post op lethargy resolving and therapy ongoing. CIR recommended due to significant deficits in mobility and ability to carry out ADL tasks   Hospital Course: [place] was admitted to rehab 12/27/2016 for inpatient therapies to consist of PT and OT at least three hours five days a week. Past admission physiatrist, therapy team and rehab RN have worked together to provide customized collaborative inpatient rehab. Endurance levels have continued to fluctuate and intake remains poor. Nutritional supplements were offered frequently to help with malnutritioned state. He has had issues with diarrhea and ammonia levels are normal therefore lactulose was placed on hold. Nephrology has been following for input and SCr is stabilizing out. He continues to have urinary retention requiring caths every 8 to 12 hours with urine output  at 400- 500 cc/ 24 hours.  Ascites  has been monitored and paracentesis was limited by nephology. Diet was therefore liberalized to regular with low sodium to help with food choices. His therapy continued to be limited by abdominal pain and GI  issues.   His intake remains poor and he has had increase in abdominal distension with pain. He underwent paracenteses of 1000 cc on 4/13 and then again for 1500 cc on 4/18.  He was treated with IVF X 24 hours due to rise in Scr to 3.23.  He was transfused with one unit PRBC on 4/15 due to drop in H/H to 7.2/21.4.  He has developed bloody drainage from L-BKA site over the weekend and  Thrombocytopenia has been stable but he was found to have coagulopathy with rise in INR to 2.21.    He was treated with IV vitamin K X 3 days without improvement. Dr. Bertis Ruddy was consulted for input and recommended FFP and transfusion to help manage multiorgan failure. She recommended transfer to telemetry/ closely monitored unit for medical care and Dr. Benjamine Mola was consulted for assistance. He was discharged to acute unit on 01/03/17.     Current medications:  . brimonidine  1 drop Both Eyes Daily  . feeding supplement (ENSURE ENLIVE)  237 mL Oral TID BM  . feeding supplement (PRO-STAT SUGAR FREE 64)  30 mL Oral BID  . insulin aspart  0-5 Units Subcutaneous QHS  . insulin aspart  0-9 Units Subcutaneous TID WC  . midodrine  15 mg Oral TID WC  . pantoprazole  40 mg Oral BID  . protein supplement  1 scoop Oral TID WC  . rifaximin  550 mg Oral BID  . sucralfate  1 g Oral TID WC & HS    Diet: Low salt.   Special Instructions: 1. Air mattress overlay for pressure relief. 2. Offer supplements  Disposition: Acute hospital   Signed: Jacquelynn Cree 01/03/2017, 5:14 PM

## 2017-01-03 NOTE — Progress Notes (Signed)
Physical Therapy Session Note  Patient Details  Name: Jeffery [name] MRN: [id number] Date of Birth: November 01, 1950  Today's Date: 01/03/2017 PT Individual Time: 2130-8657 PT Individual Time Calculation (min): 23 min   Short Term Goals: Week 1:  PT Short Term Goal 1 (Week 1): pt will move supine> sit with bedrails, to R, with supervision consistently PT Short Term Goal 2 (Week 1): pt will transfer using slide board with assist of 1 person PT Short Term Goal 3 (Week 1): pt will propel w/c up/down 3% grade ramp with supervison PT Short Term Goal 4 (Week 1): pt will participate in power w/c evaluation  Skilled Therapeutic Interventions/Progress Updates:    Pt arriving back from procedure at beginning of session. Pt states that he is feeling very nauseated and does not want to do any moving around right now. Willing to participate in bed level exercises. LLE: QS, knee flexion stretch, SLR (min resist), hip abd/add (min resist), hip extension (all 1X10), UE: Green band elbow flexion, triceps extension (Lt with decreased resist. Band due to pain). Pt in bed with all needs in reach, visitors present and encouraging.   Therapy Documentation Precautions:  Precautions Precautions: Fall Restrictions Weight Bearing Restrictions: Yes RLE Weight Bearing: Non weight bearing LLE Weight Bearing: Non weight bearing Other Position/Activity Restrictions: NWBing LLE General: PT Amount of Missed Time (min): 7 Minutes PT Missed Treatment Reason: Other (Comment) (just returned from procedure, feeling nauseated) Pain: Moderate rated pain in abdomen, declines any pain medication. Monitored during session.  See Function Navigator for Current Functional Status.   Therapy/Group: Individual Therapy  Delton See, PT 01/03/2017, 4:32 PM

## 2017-01-03 NOTE — Progress Notes (Signed)
Subjective/Complaints:  Still oozing from stump, last dressing change was about 12 hours ago per patient. Appreciate nephrology note. ROS: pt denies nausea, vomiting, diarrhea, cough, shortness of breath or chest pain  Objective: Vital Signs: Blood pressure 132/67, pulse 74, temperature 98 F (36.7 C), temperature source Oral, resp. rate 18, height 6' 1"  (1.854 m), weight 108.6 kg (239 lb 8 oz), SpO2 99 %. Dg Chest 2 View  Result Date: 01/01/2017 CLINICAL DATA:  Persistent cough. EXAM: CHEST  2 VIEW COMPARISON:  12/18/2016 FINDINGS: AP and lateral views of the chest show markedly low lung volumes without pulmonary edema or pleural effusion. Probable subsegmental atelectasis right base is new in the 2 week interval since prior study. The cardiopericardial silhouette is within normal limits for size. The visualized bony structures of the thorax are intact. IMPRESSION: New right basilar opacity suggests atelectasis. Low lung volumes. Electronically Signed   By: Misty Stanley M.D.   On: 01/01/2017 17:38   Results for orders placed or performed during the hospital encounter of 12/27/16 (from the past 72 hour(s))  Glucose, capillary     Status: Abnormal   Collection Time: 12/31/16 10:43 AM  Result Value Ref Range   Glucose-Capillary 133 (H) 65 - 99 mg/dL  Glucose, capillary     Status: Abnormal   Collection Time: 12/31/16 11:38 AM  Result Value Ref Range   Glucose-Capillary 163 (H) 65 - 99 mg/dL  Urinalysis, Routine w reflex microscopic     Status: Abnormal   Collection Time: 12/31/16  2:41 PM  Result Value Ref Range   Color, Urine AMBER (A) YELLOW    Comment: BIOCHEMICALS MAY BE AFFECTED BY COLOR   APPearance HAZY (A) CLEAR   Specific Gravity, Urine 1.018 1.005 - 1.030   pH 5.0 5.0 - 8.0   Glucose, UA NEGATIVE NEGATIVE mg/dL   Hgb urine dipstick MODERATE (A) NEGATIVE   Bilirubin Urine NEGATIVE NEGATIVE   Ketones, ur NEGATIVE NEGATIVE mg/dL   Protein, ur NEGATIVE NEGATIVE mg/dL    Nitrite NEGATIVE NEGATIVE   Leukocytes, UA NEGATIVE NEGATIVE   RBC / HPF 0-5 0 - 5 RBC/hpf   WBC, UA 6-30 0 - 5 WBC/hpf   Bacteria, UA NONE SEEN NONE SEEN   Squamous Epithelial / LPF 0-5 (A) NONE SEEN   Hyaline Casts, UA PRESENT    Granular Casts, UA PRESENT   Culture, Urine     Status: None   Collection Time: 12/31/16  2:41 PM  Result Value Ref Range   Specimen Description URINE, CATHETERIZED    Special Requests NONE    Culture NO GROWTH    Report Status 01/01/2017 FINAL   Glucose, capillary     Status: Abnormal   Collection Time: 12/31/16  4:42 PM  Result Value Ref Range   Glucose-Capillary 134 (H) 65 - 99 mg/dL  Occult blood card to lab, stool     Status: Abnormal   Collection Time: 12/31/16  8:17 PM  Result Value Ref Range   Fecal Occult Bld POSITIVE (A) NEGATIVE  Glucose, capillary     Status: Abnormal   Collection Time: 12/31/16  9:00 PM  Result Value Ref Range   Glucose-Capillary 122 (H) 65 - 99 mg/dL  Basic metabolic panel     Status: Abnormal   Collection Time: 01/01/17  2:56 AM  Result Value Ref Range   Sodium 132 (L) 135 - 145 mmol/L   Potassium 4.4 3.5 - 5.1 mmol/L   Chloride 104 101 - 111 mmol/L  CO2 22 22 - 32 mmol/L   Glucose, Bld 117 (H) 65 - 99 mg/dL   BUN 54 (H) 6 - 20 mg/dL   Creatinine, Ser 3.35 (H) 0.61 - 1.24 mg/dL   Calcium 8.1 (L) 8.9 - 10.3 mg/dL   GFR calc non Af Amer 18 (L) >60 mL/min   GFR calc Af Amer 21 (L) >60 mL/min    Comment: (NOTE) The eGFR has been calculated using the CKD EPI equation. This calculation has not been validated in all clinical situations. eGFR's persistently <60 mL/min signify possible Chronic Kidney Disease.    Anion gap 6 5 - 15  Glucose, capillary     Status: Abnormal   Collection Time: 01/01/17  5:23 AM  Result Value Ref Range   Glucose-Capillary 103 (H) 65 - 99 mg/dL  Protime-INR     Status: Abnormal   Collection Time: 01/01/17  7:35 AM  Result Value Ref Range   Prothrombin Time 24.9 (H) 11.4 - 15.2 seconds    INR 2.21   APTT     Status: Abnormal   Collection Time: 01/01/17  7:35 AM  Result Value Ref Range   aPTT 83 (H) 24 - 36 seconds    Comment:        IF BASELINE aPTT IS ELEVATED, SUGGEST PATIENT RISK ASSESSMENT BE USED TO DETERMINE APPROPRIATE ANTICOAGULANT THERAPY.   Glucose, capillary     Status: Abnormal   Collection Time: 01/01/17 11:35 AM  Result Value Ref Range   Glucose-Capillary 116 (H) 65 - 99 mg/dL  Glucose, capillary     Status: Abnormal   Collection Time: 01/01/17  9:54 PM  Result Value Ref Range   Glucose-Capillary 108 (H) 65 - 99 mg/dL  Basic metabolic panel     Status: Abnormal   Collection Time: 01/02/17  6:36 AM  Result Value Ref Range   Sodium 132 (L) 135 - 145 mmol/L   Potassium 4.6 3.5 - 5.1 mmol/L   Chloride 104 101 - 111 mmol/L   CO2 21 (L) 22 - 32 mmol/L   Glucose, Bld 91 65 - 99 mg/dL   BUN 57 (H) 6 - 20 mg/dL   Creatinine, Ser 3.33 (H) 0.61 - 1.24 mg/dL   Calcium 8.2 (L) 8.9 - 10.3 mg/dL   GFR calc non Af Amer 18 (L) >60 mL/min   GFR calc Af Amer 21 (L) >60 mL/min    Comment: (NOTE) The eGFR has been calculated using the CKD EPI equation. This calculation has not been validated in all clinical situations. eGFR's persistently <60 mL/min signify possible Chronic Kidney Disease.    Anion gap 7 5 - 15  CBC     Status: Abnormal   Collection Time: 01/02/17  6:36 AM  Result Value Ref Range   WBC 5.6 4.0 - 10.5 K/uL   RBC 2.42 (L) 4.22 - 5.81 MIL/uL   Hemoglobin 8.0 (L) 13.0 - 17.0 g/dL   HCT 23.1 (L) 39.0 - 52.0 %   MCV 95.5 78.0 - 100.0 fL   MCH 33.1 26.0 - 34.0 pg   MCHC 34.6 30.0 - 36.0 g/dL   RDW 19.9 (H) 11.5 - 15.5 %   Platelets 67 (L) 150 - 400 K/uL    Comment: CONSISTENT WITH PREVIOUS RESULT REPEATED TO VERIFY   Protime-INR     Status: Abnormal   Collection Time: 01/02/17  6:36 AM  Result Value Ref Range   Prothrombin Time 25.3 (H) 11.4 - 15.2 seconds   INR 2.25  Glucose, capillary     Status: None   Collection Time: 01/02/17   6:58 AM  Result Value Ref Range   Glucose-Capillary 84 65 - 99 mg/dL  Glucose, capillary     Status: Abnormal   Collection Time: 01/02/17 11:24 AM  Result Value Ref Range   Glucose-Capillary 124 (H) 65 - 99 mg/dL   Comment 1 Notify RN   Glucose, capillary     Status: Abnormal   Collection Time: 01/02/17  4:27 PM  Result Value Ref Range   Glucose-Capillary 155 (H) 65 - 99 mg/dL   Comment 1 Notify RN   Glucose, capillary     Status: Abnormal   Collection Time: 01/02/17  9:23 PM  Result Value Ref Range   Glucose-Capillary 161 (H) 65 - 99 mg/dL  Protime-INR     Status: Abnormal   Collection Time: 01/03/17  6:28 AM  Result Value Ref Range   Prothrombin Time 24.8 (H) 11.4 - 15.2 seconds   INR 2.20   Glucose, capillary     Status: Abnormal   Collection Time: 01/03/17  6:43 AM  Result Value Ref Range   Glucose-Capillary 130 (H) 65 - 99 mg/dL     HEENT: normal Cardio: RRR Resp: CTA GI: BS positive and NT, + ascites Extremity:  No UE edema, distal L BK stump edema Skin:   Intact Neuro: very alert/Oriented and Abnormal Motor 4/5 BUE and bilateral HF Musc/Skel:  Other well healed R AKA, Left BKA , 4 x 4, saturated small spot on a AVD pad onKerlix.,Smalloozing,frommidpointoftheincision,aswellasthemedialaspect   Assessment/Plan: 1. 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 Physiatrist is providing close team supervision and 24 hour management of active medical problems listed below. Physiatrist and rehab team continue to assess barriers to discharge/monitor patient progress toward functional and medical goals. FIM: Function - Bathing Position: Bed Body parts bathed by patient: Right arm, Left arm, Chest, Abdomen Body parts bathed by helper: Back, Buttocks Bathing not applicable: Right upper leg, Right lower leg, Left lower leg  Function- Upper Body  Dressing/Undressing What is the patient wearing?: Pull over shirt/dress Pull over shirt/dress - Perfomed by patient: Thread/unthread right sleeve, Thread/unthread left sleeve, Put head through opening, Pull shirt over trunk Assist Level: Set up, Supervision or verbal cues Set up : To obtain clothing/put away Function - Lower Body Dressing/Undressing What is the patient wearing?: Pants Position: Bed Pants- Performed by patient: Thread/unthread right pants leg, Thread/unthread left pants leg Pants- Performed by helper: Pull pants up/down Assist for footwear:  (bilateral amputee)  Function - Toileting Toileting activity did not occur: Safety/medical concerns Toileting steps completed by helper: Adjust clothing prior to toileting, Performs perineal hygiene, Adjust clothing after toileting (per Benjamin Stain, NT)  Function - Toilet Transfers Toilet transfer activity did not occur: Safety/medical concerns Assist level to toilet: 2 helpers (per Benjamin Stain, NT) Assist level from toilet: 2 helpers  Function - Chair/bed transfer Chair/bed transfer method: Lateral scoot Chair/bed transfer assist level: 2 helpers Chair/bed transfer assistive device: Sliding board, Armrests Chair/bed transfer details: Manual facilitation for weight shifting, Verbal cues for safe use of DME/AE, Verbal cues for precautions/safety, Verbal cues for technique, Tactile cues for posture, Manual facilitation for placement  Function - Locomotion: Wheelchair Will patient use wheelchair at discharge?: Yes Type: Manual Max wheelchair distance: 250 Assist Level: Supervision or verbal cues Assist Level: Supervision or verbal cues  Assist Level: Supervision or verbal cues Turns around,maneuvers to table,bed, and toilet,negotiates 3% grade,maneuvers on rugs and over doorsills: No Function - Locomotion: Ambulation Ambulation activity did not occur: N/A  Function - Comprehension Comprehension: Auditory Comprehension  assist level: Follows complex conversation/direction with no assist  Function - Expression Expression: Verbal Expression assist level: Expresses complex ideas: With no assist  Function - Social Interaction Social Interaction assist level: Interacts appropriately with others - No medications needed.  Function - Problem Solving Problem solving assist level: Solves complex problems: Recognizes & self-corrects  Function - Memory Memory assist level: Complete Independence: No helper Patient normally able to recall (first 3 days only): Current season, Location of own room, Staff names and faces, That he or she is in a hospital   Medical Problem List and Plan: 1.  Gait abnormality and limitation in self-care secondary to left BKA with history of right AKA. Team conference today please see physician documentation under team conference tab, met with team face-to-face to discuss problems,progress, and goals. Formulized individual treatment plan based on medical history, underlying problem and comorbidities. 2.  DVT Prophylaxis/Anticoagulation: Pharmaceutical:off heparin due to elevated PT/PTT       3. Pain Management: Controlled on tylenol prn, except positional abd pain due to ascites- rec repeat US guided paracentesis today 1.5 L 4. Mood: LCSW to follow for evaluation and support 5. Neuropsych: This patient is capable of making decisions on his own behalf. 6. Skin/Wound Care: Routine pressure relief measures.wound healing will be a challenge due to hypoalb 7. Fluids/Electrolytes/Nutrition: Strict I/O. 1200 cc FR daily, ensure supplement,Will be challenge to maintain nutrition given low grade nauea from hepatorenal failure 8. Acute on chronic renal insufficiency: poor po intake,  poor urine output (300cc?), weight stable. Cr up to 3.23 today    -check PVR's, UA/UCX Neg  -further mgt per renal   -RD consult 9. Hypotension: Monitor BP bid--proamatine tid to help with support.   -encourage  fluids  -IVF as above 10. Cirrhosis of liver with ascites: On Xifaxan bid and lactulose qid  -intermittent nausea   -is afraid to eat "solids"  -RD consult 11. T2DM with neuropathy: Hgb A1c- 5.8-- Has been off meds for years. Monitor BS ac/hs.   -reasonable control but not eating CBG (last 3)   Recent Labs  01/02/17 1627 01/02/17 2123 01/03/17 0643  GLUCAP 155* 161* 130*    12. Hyponatremia: 132 today 13.  Acute on Chronic anemia- post -op/chronic kidney disease  -transfuse 1 unit PRBC 4/15  -wound oozing slowly, coagulopathy with elevated PT/PTT, not responding to Vit K, may lack adequate K dependant clotting factors, ? If FFP needed        LOS (Days) 7 A FACE TO FACE EVALUATION WAS PERFORMED  KIRSTEINS,ANDREW E 01/03/2017, 9:48 AM

## 2017-01-03 NOTE — Discharge Instructions (Signed)
Inpatient Rehab Discharge Instructions  Javaughn Opdahl Discharge date and time:  01/03/17  Activities/Precautions/ Functional Status: Activity: no lifting, driving, or strenuous exercise till cleared by MD Diet: Low salt Wound Care: Wash with soap and water. Pat dry. Apply dry compressive dressing.   Functional status:  ___ No restrictions     ___ Walk up steps independently _X__ 24/7 supervision/assistance   ___ Walk up steps with assistance ___ Intermittent supervision/assistance  ___ Bathe/dress independently ___ Walk with walker     _X__ Bathe/dress with assistance ___ Walk Independently    ___ Shower independently ___ Walk with assistance    ___ Shower with assistance ___ No alcohol     ___ Return to work/school ________  Special Instructions:    My questions have been answered and I understand these instructions. I will adhere to these goals and the provided educational materials after my discharge from the hospital.  Patient/Caregiver Signature _______________________________ Date __________  Clinician Signature _______________________________________ Date __________  Please bring this form and your medication list with you to all your follow-up doctor's appointments.

## 2017-01-03 NOTE — Progress Notes (Signed)
Occupational Therapy Note  Patient Details  Name: Jeffery [name] MRN: [id number] Date of Birth: 02/27/1951  Today's Date: 01/03/2017 OT Individual Time: 4098-1191 OT Individual Time Calculation (min): 45 min   Attempted to see pt at 1300 for scheduled OT session, transport team also present to take pt off unit for procedure. Attempted to see pt again at 1400 and pt still off unit. Will attempt to make up time as able.   [name], Edie Vallandingham C 01/03/2017, 2:15 PM

## 2017-01-03 NOTE — Progress Notes (Signed)
Report given to nurse on 6E.  Awaiting transfer.  Dani Gobble, RN

## 2017-01-03 NOTE — Progress Notes (Signed)
[facility] Progress Note    Assessment/ Plan:   1 AKI /CKD3- Exacerbation prob due to ABLA and unstable hemodynamics.  Creatinine has plateaued.  Recommend checking renal function panel daily until downtrend esp since he's going to have paracentesis today 2 Cirrhosis w/ ascites; ESLD- I think a 1-1.5L paracentesis would be OK.  Do not recommend large vol paracentesis at this time.  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  Having some oozing and not really responding to Vit K 5 Anemia, s/p 1u pRBCs with appropriate response. 6.  Coagulopathy- 2/2 liver disease  Subjective:    Some vomiting overnight.  For paracentesis today.  Has some oozing from stump   Objective:   BP 132/67 (BP Location: Left Arm)   Pulse 74   Temp 98 F (36.7 C) (Oral)   Resp 18   Ht  (1.854 m)   Wt 108.6 kg (239 lb 8 oz)   SpO2 99%   BMI 31.60 kg/m   Intake/Output Summary (Last 24 hours) at 01/03/17 1246 Last data filed at 01/03/17 0230  Gross per 24 hour  Intake              300 ml  Output              200 ml  Net              100 ml   Weight change: 2.637 kg (5 lb 13 oz)  Physical Exam: General appearance: alert and cooperative, pale HEENT: EOMI, no JVD GI: soft, non-tender; bowel sounds normal; no masses,  no organomegaly and large ascites  No asterixis Bilat amp;R AKA, L BKA wrapped   Imaging: Dg Chest 2 View  Result Date: 01/01/2017 CLINICAL DATA:  Persistent cough. EXAM: CHEST  2 VIEW COMPARISON:  12/18/2016 FINDINGS: AP and lateral views of the chest show markedly low lung volumes without pulmonary edema or pleural effusion. Probable subsegmental atelectasis right base is new in the 2 week interval since prior study. The cardiopericardial silhouette is within normal  limits for size. The visualized bony structures of the thorax are intact. IMPRESSION: New right basilar opacity suggests atelectasis. Low lung volumes. Electronically Signed   By: Kennith Center M.D.   On: 01/01/2017 17:38    Labs: BMET  Recent Labs Lab 12/29/16 1032 12/31/16 0503 01/01/17 0256 01/02/17 0636  NA 130* 132* 132* 132*  K 4.3 4.6 4.4 4.6  CL 103 103 104 104  CO2 19* 23 22 21*  GLUCOSE 145* 137* 117* 91  BUN 49* 56* 54* 57*  CREATININE 2.84* 3.23* 3.35* 3.33*  CALCIUM 8.2* 8.3* 8.1* 8.2*  PHOS 3.0 3.7  --   --    CBC  Recent Labs Lab 12/29/16 1131 12/31/16 0503 01/02/17 0636  WBC 5.0 3.9* 5.6  NEUTROABS 2.6  --   --   HGB 7.7* 7.2* 8.0*  HCT 23.2* 21.4* 23.1*  MCV 98.3 96.4 95.5  PLT 68* 54* 67*    Medications:    . brimonidine  1 drop Both Eyes Daily  . feeding supplement (ENSURE ENLIVE)  237 mL Oral TID BM  . feeding supplement (PRO-STAT SUGAR FREE 64)  30 mL Oral BID  . insulin aspart  0-5 Units Subcutaneous QHS  . insulin aspart  0-9 Units  Subcutaneous TID WC  . midodrine  15 mg Oral TID WC  . pantoprazole  40 mg Oral BID  . protein supplement  1 scoop Oral TID WC  . rifaximin  550 mg Oral BID  . sucralfate  1 g Oral TID WC & HS      Bufford Buttner MD 01/03/2017, 12:46 PM

## 2017-01-03 NOTE — Progress Notes (Signed)
Social Work Patient ID: Jeffery [name], male   DOB: 30-Jun-1951, 66 y.o.   MRN: [id number]  Met with pt to discuss team conference update revision of goals to min assist wheelchair level and the fact he will need 24 hr care at home. He can see this and will have grad student and his wife assisting him along with friends and neighbor who is a PA here in the hospital. He is having much pain due to his fluid issues and plan to tap again today. He is aware of multiple MD's following and monitoring his issues. Gave him his target discharge date of 5/4. He is hoping his pain will be alleivated once fluid removed. Discussed will need to do training with caregivers prior to discharge home. He will talk with them and see when this would be possible. His main concern was when could he return to work. Informed him MD will be the one who decides this. He will follow up with MD.

## 2017-01-03 NOTE — Progress Notes (Signed)
Physical Therapy Session Note  Patient Details  Name: Jeffery [name] MRN: [id number] Date of Birth: [birth year]  Today's Date: 01/03/2017 PT Individual Time: 0803-0915 PT Individual Time Calculation (min): 72 min   Short Term Goals: Week 1:  PT Short Term Goal 1 (Week 1): pt will move supine> sit with bedrails, to R, with supervision consistently PT Short Term Goal 2 (Week 1): pt will transfer using slide board with assist of 1 person PT Short Term Goal 3 (Week 1): pt will propel w/c up/down 3% grade ramp with supervison PT Short Term Goal 4 (Week 1): pt will participate in power w/c evaluation  Skilled Therapeutic Interventions/Progress Updates:   Pt received sitting EOB and agreeable to PT. Sitting balance training sitting EOB with intermittent 1-0 UE support to finish breakfast. Mild LOB x 2 with out added assist from PT for pt to correct. Sit<>supine x 2 for clothing management and attempt to have bowel movement on bed plan. Beezy bed>chair transfer with max assist from PT progressing to mod assist with progression across board. WC mobility through hall x 153f with BUE propulsion wth supervision assist from PT. [facility] and all needs met.          Therapy Documentation Precautions:  Precautions Precautions: Fall Restrictions Weight Bearing Restrictions: Yes RLE Weight Bearing: Non weight bearing LLE Weight Bearing: Non weight bearing Other Position/Activity Restrictions: NWBing LLE Vital Signs: Therapy Vitals Temp: 98 F (36.7 C) Temp Source: Oral Pulse Rate: 74 Resp: 18 BP: 132/67 Patient Position (if appropriate): Lying Oxygen Therapy SpO2: 99 % O2 Device: Not Delivered Pain: Pain Assessment Pain Assessment: No/denies pain   See Function Navigator for Current Functional Status.   Therapy/Group: Individual Therapy  ALorie Phenix4/18/2018, 10:06 AM

## 2017-01-03 NOTE — Progress Notes (Signed)
Occupational Therapy Session Note  Patient Details  Name: Jeffery [name] MRN: [id number] Date of Birth: 1951/08/28  Today's Date: 01/03/2017 OT Individual Time: 7829-5621 OT Individual Time Calculation (min): 45 min    Short Term Goals: Week 1:  OT Short Term Goal 1 (Week 1): Pt will complete slide board to [facility] with max A of 1. OT Short Term Goal 2 (Week 1): Pt will be able to wash LB with set up only from bed level. OT Short Term Goal 3 (Week 1): Pt will be able to don LB clothing with set up from bed level. OT Short Term Goal 4 (Week 1): Pt will demonstrate improved activity tolerance to participate for at least 10 continuous minutes without a rest break.   Skilled Therapeutic Interventions/Progress Updates:    Pt seen for OT session focusing on ADL re-training and d/c planning. Pt sitting up in w/c upon arrival working on computer. Required increased time for distraction away from pt's work on computer, but with time pt agreeable to session. He declined working on transfers due to fatigue from previous session. Complaints of nausea and generalized weak feeling.  He completed grooming tasks from w/c level at sink with set-up. He required increased time and rest breaks with all tasks and demonstrated poor frustration tolerance with some tasks.  Discussed at length d/c planning and at what level pt believes he must get to in order to go home. Pt states he now has 24 hr care in place and stated they could provide physical assist if needed though he did not specifically name people he had to assist. Therapist and pt agreed that working on transfers needs to be top priority in therapy and also discussed reasons why transfers and training is so difficult (poor diet, decreased energy, generalized deconditioning, sacral wounds, etc.) Also discussed at length toileting tasks and transfers. Have not yet attempted sliding board transfers to BSC/ toilet as pt unable to tolerate 2 transfers in short period  of time.  Also lateral leans for otileting task and pt's endurance level to be able to complete task.  Pt left seated in w/c at end of session, propelling around room to gather items. Nursing made aware of pt's position. Pt agreed to working on transfer at [employer] session.   Therapy Documentation Precautions:  Precautions Precautions: Fall Restrictions Weight Bearing Restrictions: Yes RLE Weight Bearing: Non weight bearing LLE Weight Bearing: Non weight bearing Other Position/Activity Restrictions: NWBing LLE ADL: ADL ADL Comments: refer to functional navigator  See Function Navigator for Current Functional Status.   Therapy/Group: Individual Therapy  [name], Louvina Cleary C 01/03/2017, 7:12 AM

## 2017-01-03 NOTE — Plan of Care (Signed)
Problem: RH SKIN INTEGRITY Goal: RH STG SKIN FREE OF INFECTION/BREAKDOWN No new skin breakdown or infection while on rehab with min assist from staff.  Outcome: Progressing Pink foam to buttocks remains dry and intact, no new skin breakdown noted Goal: RH STG ABLE TO PERFORM INCISION/WOUND CARE W/ASSISTANCE STG Able To Perform Incision/Wound Care With mod Assistance.  Outcome: Not Progressing Moderate amount of sanguinous drainage noted from left stump incision, incision cleaned with NS and covered with 4x4s, ABD pad, kerlix a,d ACE wrap, patient tolerated well   Problem: RH SAFETY Goal: RH STG ADHERE TO SAFETY PRECAUTIONS W/ASSISTANCE/DEVICE STG Adhere to Safety Precautions With min Assistance/Device.  Outcome: Progressing Safety precautions and fall prevention maintained Goal: RH STG DECREASED RISK OF FALL WITH ASSISTANCE STG Decreased Risk of Fall With min Assistance.  Outcome: Progressing No fall noted this shift,   Problem: RH PAIN MANAGEMENT Goal: RH STG PAIN MANAGED AT OR BELOW PT'S PAIN GOAL Managed at goal 3/10  Outcome: Progressing Medicated once with Tylenol and Robaxin this shift with mild relief

## 2017-01-03 NOTE — H&P (Addendum)
History and Physical    Jeffery [name] ZOX:[id number] DOB: 26-Aug-1951 DOA: (Not on file)  Referring MD/NP/PA:  PCP: Julian Hy, MD Outpatient Specialists:  Patient coming from: inpt rehab  Chief Complaint:   HPI: Jeffery [name] is a 66 y.o. male with medical history significant of retention, diabetes, end-stage liver disease, and was recently inpatient rehabilitation after amputation due to osteomyelitis in February. Patient was in inpatient rehabilitation when he began to bleed from his stump. Dr. Shella Spearing of hematology saw the patient and wanted the patient transferred to telemetry floor for FFP.   Patient was admitted to rehabilitation on 12/27/2016 but has not been able to participate in rehabilitation fully and intake of PO food/supplements has been poor.  He is on a air mattress due to diarrhea and development of sores. Patient also having urinary retention and has been requiring in and out caths. Patient is getting a Foley currently. Patient has been to an interventional radiology several times to have his ascites drained.  Drainage has been limited due to hypotension. Over this past weekend patient developed but bloody drainage from his left BKA site. He was found to have an elevated INR and was given IV vitamin K 3 days without improvement. Dr. Bertis Ruddy saw patient and recommended FFP therefore transferred [facility]    Review of Systems: all systems reviewed, negative unless stated above in HPI   Past Medical History:  Diagnosis Date  . Arthritis   . Asthma   . Bilateral cataracts   . Blurred vision    improved now  . Cholelithiasis   . Diabetes mellitus    type II  . Eczema    inner wrist  . Hypertension   . Muscle cramps   . Necrotizing fasciitis (HCC)   . Neuropathy   . Rash    wrist and hands since late june  . Retinal degeneration     Past Surgical History:  Procedure Laterality Date  . ABOVE KNEE LEG AMPUTATION     right  . AMPUTATION Left  12/23/2016   Procedure: AMPUTATION BELOW KNEE;  Surgeon: Eldred Manges, MD;  Location: [facility] OR;  Service: Orthopedics;  Laterality: Left;  . APPLICATION OF WOUND VAC Left 11/06/2016   Procedure: APPLICATION OF WOUND VAC;  Surgeon: Eldred Manges, MD;  Location: [facility] OR;  Service: Orthopedics;  Laterality: Left;  . COLONOSCOPY WITH PROPOFOL N/A 05/18/2016   Procedure: COLONOSCOPY WITH PROPOFOL;  Surgeon: Rachael Fee, MD;  Location: [facility] ENDOSCOPY;  Service: Endoscopy;  Laterality: N/A;  . ESOPHAGOGASTRODUODENOSCOPY (EGD) WITH PROPOFOL N/A 05/18/2016   Procedure: ESOPHAGOGASTRODUODENOSCOPY (EGD) WITH PROPOFOL;  Surgeon: Rachael Fee, MD;  Location: [facility] ENDOSCOPY;  Service: Endoscopy;  Laterality: N/A;  . ESOPHAGOGASTRODUODENOSCOPY (EGD) WITH PROPOFOL N/A 11/01/2016   Procedure: ESOPHAGOGASTRODUODENOSCOPY (EGD) WITH PROPOFOL;  Surgeon: Napoleon Form, MD;  Location: [facility] ENDOSCOPY;  Service: Endoscopy;  Laterality: N/A;  . EYE SURGERY  09/2011   bilateral for retinopathy - laser eye surgery  . I&D EXTREMITY Left 10/25/2016   Procedure: LEFT FOOT DEBRIDEMENT RAY AMPUTATION PLACEMENT OF WOUND VAC;  Surgeon: Eldred Manges, MD;  Location: [facility] OR;  Service: Orthopedics;  Laterality: Left;  . I&D EXTREMITY Left 11/06/2016   Procedure: IRRIGATION AND DEBRIDEMENT EXTREMITY/LEFT FOOT;  Surgeon: Eldred Manges, MD;  Location: [facility] OR;  Service: Orthopedics;  Laterality: Left;  . IR PARACENTESIS  12/20/2016  . IR PARACENTESIS  12/29/2016  . IR PARACENTESIS  01/03/2017  . left metatarsal  2003   5th  .  TOE AMPUTATION     Left big toe     reports that he has never smoked. He has never used smokeless tobacco. He reports that he does not drink alcohol or use drugs.  No Known Allergies  Family History  Problem Relation Age of Onset  . Cancer Mother     unknown type     Prior to Admission medications   Medication Sig Start Date End Date Taking? Authorizing Provider  atorvastatin (LIPITOR) 40 MG tablet Take 40 mg by  mouth daily. 01/31/16   Historical Provider, MD  brimonidine (ALPHAGAN P) 0.1 % SOLN Place 1 drop into both eyes daily.    Historical Provider, MD  feeding supplement, ENSURE ENLIVE, (ENSURE ENLIVE) LIQD Take 237 mLs by mouth 2 (two) times daily between meals. 12/27/16   Clanford Cyndie Mull, MD  insulin aspart (NOVOLOG FLEXPEN) 100 UNIT/ML FlexPen Inject before meals and at bedtime per sliding scale: if BGL is: 200 - 250 = 2 units; 251 - 300 = 4 units; 301 - 350 = 6 units; 351 - 400 = 8 units, if greater than 400 notify MD    Historical Provider, MD  ipratropium-albuterol (DUONEB) 0.5-2.5 (3) MG/3ML SOLN  12/07/16   Historical Provider, MD  lactulose (CHRONULAC) 10 GM/15ML solution Take 15 mLs (10 g total) by mouth 3 (three) times daily. 12/27/16   Clanford Cyndie Mull, MD  loperamide (IMODIUM) 2 MG capsule Take 1 capsule (2 mg total) by mouth as needed for diarrhea or loose stools. 11/07/16   Rolly Salter, MD  methocarbamol (ROBAXIN) 500 MG tablet Take 0.5 tablets (250 mg total) by mouth every 8 (eight) hours as needed for muscle spasms. 11/07/16   Rolly Salter, MD  metoCLOPramide (REGLAN) 5 MG tablet Take 1 tablet (5 mg total) by mouth every 6 (six) hours as needed for nausea. 11/07/16   Rolly Salter, MD  midodrine (PROAMATINE) 5 MG tablet Take 3 tablets (15 mg total) by mouth 3 (three) times daily with meals. 12/27/16   Clanford Cyndie Mull, MD  Multiple Vitamin (MULTIVITAMIN WITH MINERALS) TABS tablet Take 1 tablet by mouth daily.    Historical Provider, MD  oxyCODONE (ROXICODONE) 5 MG immediate release tablet Take one tablet by mouth every 4 hours as needed for pain Patient taking differently: Take 5 mg by mouth every 4 (four) hours as needed for severe pain.  11/29/16   Kimber Relic, MD  pantoprazole (PROTONIX) 40 MG tablet Take 1 tablet (40 mg total) by mouth 2 (two) times daily. 11/07/16   Rolly Salter, MD  rifaximin (XIFAXAN) 550 MG TABS tablet Take 1 tablet (550 mg total) by mouth 2 (two) times  daily. 12/27/16   Clanford Cyndie Mull, MD  saccharomyces boulardii (FLORASTOR) 250 MG capsule Take 1 capsule (250 mg total) by mouth 2 (two) times daily. 11/07/16   Rolly Salter, MD  sucralfate (CARAFATE) 1 GM/10ML suspension Take 10 mLs (1 g total) by mouth 4 (four) times daily -  with meals and at bedtime. Patient taking differently: Take 1 g by mouth 4 (four) times daily -  before meals and at bedtime.  11/07/16   Rolly Salter, MD    Physical Exam: There were no vitals filed for this visit.    Constitutional: weak/pale and waxy skin appearance-- had temporal muscle wasting There were no vitals filed for this visit. Eyes: PERRL, lids and conjunctivae normal ENMT: Mucous membranes are moist. Neck: normal, supple, no masses, no thyromegaly Respiratory:  clear to auscultation bilaterally, no wheezing, no crackles. Normal respiratory effort. No accessory muscle use.  Cardiovascular: Regular rate and rhythm, no murmurs / rubs / gallops. +pitting edema Abdomen: + fluid wave, distended abdomen Musculoskeletal: b/l amputee - stump covered by dressing Neurologic: weak Psychiatric: flat affect     Labs on Admission: I have personally reviewed following labs and imaging studies  CBC:  Recent Labs Lab 12/29/16 1131 12/31/16 0503 01/02/17 0636  WBC 5.0 3.9* 5.6  NEUTROABS 2.6  --   --   HGB 7.7* 7.2* 8.0*  HCT 23.2* 21.4* 23.1*  MCV 98.3 96.4 95.5  PLT 68* 54* 67*   Basic Metabolic Panel:  Recent Labs Lab 12/29/16 1032 12/31/16 0503 01/01/17 0256 01/02/17 0636  NA 130* 132* 132* 132*  K 4.3 4.6 4.4 4.6  CL 103 103 104 104  CO2 19* 23 22 21*  GLUCOSE 145* 137* 117* 91  BUN 49* 56* 54* 57*  CREATININE 2.84* 3.23* 3.35* 3.33*  CALCIUM 8.2* 8.3* 8.1* 8.2*  PHOS 3.0 3.7  --   --    GFR: Estimated Creatinine Clearance: 28.5 mL/min (A) (by C-G formula based on SCr of 3.33 mg/dL (H)). Liver Function Tests:  Recent Labs Lab 12/29/16 1032 12/31/16 0503  ALBUMIN 2.0* 1.8*     No results for input(s): LIPASE, AMYLASE in the last 168 hours. No results for input(s): AMMONIA in the last 168 hours. Coagulation Profile:  Recent Labs Lab 12/29/16 1130 01/01/17 0735 01/02/17 0636 01/03/17 0628  INR 2.36 2.21 2.25 2.20   Cardiac Enzymes: No results for input(s): CKTOTAL, CKMB, CKMBINDEX, TROPONINI in the last 168 hours. BNP (last 3 results) No results for input(s): PROBNP in the last 8760 hours. HbA1C: No results for input(s): HGBA1C in the last 72 hours. CBG:  Recent Labs Lab 01/02/17 1627 01/02/17 2123 01/03/17 0643 01/03/17 1122 01/03/17 1636  GLUCAP 155* 161* 130* 125* 114*   Lipid Profile: No results for input(s): CHOL, HDL, LDLCALC, TRIG, CHOLHDL, LDLDIRECT in the last 72 hours. Thyroid Function Tests: No results for input(s): TSH, T4TOTAL, FREET4, T3FREE, THYROIDAB in the last 72 hours. Anemia Panel: No results for input(s): VITAMINB12, FOLATE, FERRITIN, TIBC, IRON, RETICCTPCT in the last 72 hours. Urine analysis:    Component Value Date/Time   COLORURINE AMBER (A) 12/31/2016 1441   APPEARANCEUR HAZY (A) 12/31/2016 1441   LABSPEC 1.018 12/31/2016 1441   PHURINE 5.0 12/31/2016 1441   GLUCOSEU NEGATIVE 12/31/2016 1441   HGBUR MODERATE (A) 12/31/2016 1441   BILIRUBINUR NEGATIVE 12/31/2016 1441   KETONESUR NEGATIVE 12/31/2016 1441   PROTEINUR NEGATIVE 12/31/2016 1441   NITRITE NEGATIVE 12/31/2016 1441   LEUKOCYTESUR NEGATIVE 12/31/2016 1441   Sepsis Labs: Invalid input(s): PROCALCITONIN, LACTICIDVEN Recent Results (from the past 240 hour(s))  Fungus Culture With Stain     Status: None (Preliminary result)   Collection Time: 12/29/16  1:26 PM  Result Value Ref Range Status   Fungus Stain Final report  Final    Comment: (NOTE) Performed At: [facility] [address] [place], [place] [id number] Mila Homer MD HY:[id number]    Fungus (Mycology) Culture PENDING  Incomplete   Fungal Source FLUID  Final  Culture, body  fluid-bottle     Status: None   Collection Time: 12/29/16  1:26 PM  Result Value Ref Range Status   Specimen Description FLUID  Final   Special Requests PERITONEAL  Final   Culture NO GROWTH 5 DAYS  Final   Report Status 01/03/2017 FINAL  Final  Gram stain     Status: None   Collection Time: 12/29/16  1:26 PM  Result Value Ref Range Status   Specimen Description FLUID  Final   Special Requests PERITONEAL  Final   Gram Stain NO WBC SEEN NO ORGANISMS SEEN   Final   Report Status 12/29/2016 FINAL  Final  Fungus Culture Result     Status: None   Collection Time: 12/29/16  1:26 PM  Result Value Ref Range Status   Result 1 Comment  Final    Comment: (NOTE) KOH/Calcofluor preparation:  no fungus observed. Performed At: [facility] [address] [place], [place] [id number] Mila Homer MD WG:[id number]   Culture, Urine     Status: None   Collection Time: 12/31/16  2:41 PM  Result Value Ref Range Status   Specimen Description URINE, CATHETERIZED  Final   Special Requests NONE  Final   Culture NO GROWTH  Final   Report Status 01/01/2017 FINAL  Final     Radiological Exams on Admission: Ir Paracentesis  Result Date: 01/03/2017 INDICATION: Patient with recurrent ascites. Request is made for therapeutic paracentesis of up to 1.5 liters. EXAM: ULTRASOUND GUIDED THERAPEUTIC PARACENTESIS MEDICATIONS: 10 mL 1% lidocaine COMPLICATIONS: None immediate. PROCEDURE: Informed written consent was obtained from the patient after a discussion of the risks, benefits and alternatives to treatment. A timeout was performed prior to the initiation of the procedure. Initial ultrasound scanning demonstrates a large amount of ascites within the right lateral abdomen. The right lateral abdomen was prepped and draped in the usual sterile fashion. 1% lidocaine was used for local anesthesia. Following this, a 19 gauge, 7-cm, Yueh catheter was introduced. An ultrasound image was saved for documentation  purposes. The paracentesis was performed. The catheter was removed and a dressing was applied. The patient tolerated the procedure well without immediate post procedural complication. FINDINGS: A total of approximately 1.5 liters of clear, yellow fluid was removed. Samples were sent to the laboratory as requested by the clinical team. IMPRESSION: Successful ultrasound-guided paracentesis yielding 1.5 liters of peritoneal fluid. Read by:  Loyce Dys PA-C Electronically Signed   By: Jolaine Click M.D.   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 Bertis Ruddy following (discussed plan of care with both her and Delle Reining) -Coagulopathy most likely due to synthetic liver dysfunction and end-stage liver cirrhosis -Patient will receive 2 units of FFP -Vitamin K was not effective - Goal INR is less then 1.5 her Dr. Bertis Ruddy -Wound care for bleeding stump  AKI on CKD -nephrology following -added to treatment team Dr. Signe Colt  DM -SSI  Anemia of chronic disease and acute blood loss -PRBCs to keep hemoglobin greater than 8 -Her to nephrology for consideration of ESA  Chronic thrombocytopenia Likely due to liver cirrhosis. -Transfuse for platelets less than 50  Recurrent ascites -When necessary paracentesis  Severe protein calorie malnutrition Dietitian consult  Patient has a poor overall prognosis and palliative care will be consulted to help with goals of care.   patient is somewhat  unrealistic with his expectations of improvement   DVT prophylaxis: none- bleeding/b/l  amputee Code Status: full Family Communication: patient Disposition  Plan:  Consults called: Gorsuch/Upton Admission status: tele inpt   Keiva Dina Juanetta Gosling DO [facility] Pager [phone]  If 7PM-7AM, please contact night-coverage [email or web address] Password [facility]  01/03/2017, 5:47 PM

## 2017-01-03 NOTE — Procedures (Signed)
PROCEDURE SUMMARY:  Successful US guided paracentesis from right lateral abdomen with a 1.5L maximum due to renal dysfunction.  Yielded 1.5 liters of clear, yellow fluid.  No immediate complications.  Pt tolerated well. Patient still with large volume ascites.  Specimen was not sent for labs.  Hoyt Koch PA-C 01/03/2017 4:14 PM

## 2017-01-04 ENCOUNTER — Ambulatory Visit: Payer: BC Managed Care – PPO | Admitting: Hematology and Oncology

## 2017-01-04 ENCOUNTER — Inpatient Hospital Stay (HOSPITAL_COMMUNITY): Payer: BC Managed Care – PPO | Admitting: Physical Therapy

## 2017-01-04 ENCOUNTER — Inpatient Hospital Stay (HOSPITAL_COMMUNITY): Payer: BC Managed Care – PPO | Admitting: Occupational Therapy

## 2017-01-04 ENCOUNTER — Other Ambulatory Visit: Payer: BC Managed Care – PPO

## 2017-01-04 DIAGNOSIS — K7689 Other specified diseases of liver: Secondary | ICD-10-CM

## 2017-01-04 DIAGNOSIS — Z515 Encounter for palliative care: Secondary | ICD-10-CM

## 2017-01-04 DIAGNOSIS — I8500 Esophageal varices without bleeding: Secondary | ICD-10-CM

## 2017-01-04 DIAGNOSIS — K7290 Hepatic failure, unspecified without coma: Secondary | ICD-10-CM

## 2017-01-04 DIAGNOSIS — R64 Cachexia: Secondary | ICD-10-CM

## 2017-01-04 DIAGNOSIS — M8600 Acute hematogenous osteomyelitis, unspecified site: Secondary | ICD-10-CM

## 2017-01-04 DIAGNOSIS — N179 Acute kidney failure, unspecified: Secondary | ICD-10-CM

## 2017-01-04 DIAGNOSIS — K766 Portal hypertension: Secondary | ICD-10-CM

## 2017-01-04 DIAGNOSIS — D500 Iron deficiency anemia secondary to blood loss (chronic): Principal | ICD-10-CM

## 2017-01-04 DIAGNOSIS — M869 Osteomyelitis, unspecified: Secondary | ICD-10-CM

## 2017-01-04 DIAGNOSIS — D631 Anemia in chronic kidney disease: Secondary | ICD-10-CM

## 2017-01-04 DIAGNOSIS — Z7189 Other specified counseling: Secondary | ICD-10-CM

## 2017-01-04 DIAGNOSIS — N189 Chronic kidney disease, unspecified: Secondary | ICD-10-CM

## 2017-01-04 DIAGNOSIS — D689 Coagulation defect, unspecified: Secondary | ICD-10-CM

## 2017-01-04 DIAGNOSIS — D61818 Other pancytopenia: Secondary | ICD-10-CM

## 2017-01-04 DIAGNOSIS — K7682 Hepatic encephalopathy: Secondary | ICD-10-CM

## 2017-01-04 DIAGNOSIS — R188 Other ascites: Secondary | ICD-10-CM

## 2017-01-04 DIAGNOSIS — E43 Unspecified severe protein-calorie malnutrition: Secondary | ICD-10-CM

## 2017-01-04 LAB — BPAM FFP
Blood Product Expiration Date: 201804222359
Blood Product Expiration Date: 201804222359
ISSUE DATE / TIME: 201804181951
ISSUE DATE / TIME: 201804182138
UNIT TYPE AND RH: 6200
Unit Type and Rh: 6200

## 2017-01-04 LAB — GLUCOSE, CAPILLARY
GLUCOSE-CAPILLARY: 85 mg/dL (ref 65–99)
GLUCOSE-CAPILLARY: 96 mg/dL (ref 65–99)
Glucose-Capillary: 122 mg/dL — ABNORMAL HIGH (ref 65–99)
Glucose-Capillary: 108 mg/dL — ABNORMAL HIGH (ref 65–99)

## 2017-01-04 LAB — CBC
HEMATOCRIT: 21.6 % — AB (ref 39.0–52.0)
HEMOGLOBIN: 7.2 g/dL — AB (ref 13.0–17.0)
MCH: 32.4 pg (ref 26.0–34.0)
MCHC: 33.3 g/dL (ref 30.0–36.0)
MCV: 97.3 fL (ref 78.0–100.0)
Platelets: 67 10*3/uL — ABNORMAL LOW (ref 150–400)
RBC: 2.22 MIL/uL — AB (ref 4.22–5.81)
RDW: 19.6 % — AB (ref 11.5–15.5)
WBC: 4.4 10*3/uL (ref 4.0–10.5)

## 2017-01-04 LAB — BASIC METABOLIC PANEL
ANION GAP: 8 (ref 5–15)
BUN: 57 mg/dL — AB (ref 6–20)
CO2: 21 mmol/L — ABNORMAL LOW (ref 22–32)
Calcium: 8.5 mg/dL — ABNORMAL LOW (ref 8.9–10.3)
Chloride: 101 mmol/L (ref 101–111)
Creatinine, Ser: 3.5 mg/dL — ABNORMAL HIGH (ref 0.61–1.24)
GFR, EST AFRICAN AMERICAN: 19 mL/min — AB (ref >60)
GFR, EST NON AFRICAN AMERICAN: 17 mL/min — AB (ref >60)
Glucose, Bld: 94 mg/dL (ref 65–99)
POTASSIUM: 4.2 mmol/L (ref 3.5–5.1)
SODIUM: 130 mmol/L — AB (ref 135–145)

## 2017-01-04 LAB — PREPARE FRESH FROZEN PLASMA
UNIT DIVISION: 0
Unit division: 0

## 2017-01-04 LAB — PREPARE RBC (CROSSMATCH)

## 2017-01-04 LAB — HEPATIC FUNCTION PANEL
ALBUMIN: 1.9 g/dL — AB (ref 3.5–5.0)
ALT: 13 U/L — AB (ref 17–63)
AST: 37 U/L (ref 15–41)
Alkaline Phosphatase: 70 U/L (ref 38–126)
BILIRUBIN TOTAL: 2.9 mg/dL — AB (ref 0.3–1.2)
Bilirubin, Direct: 0.7 mg/dL — ABNORMAL HIGH (ref 0.1–0.5)
Indirect Bilirubin: 2.2 mg/dL — ABNORMAL HIGH (ref 0.3–0.9)
Total Protein: 7.3 g/dL (ref 6.5–8.1)

## 2017-01-04 MED ORDER — SODIUM CHLORIDE 0.9 % IV SOLN: Freq: Once | INTRAVENOUS | Status: DC

## 2017-01-04 MED ORDER — ENSURE ENLIVE PO LIQD
237.0000 mL | Freq: Three times a day (TID) | ORAL | Status: DC
  Administered 2017-01-05 – 2017-01-10 (×2): 237 mL via ORAL

## 2017-01-04 MED ORDER — ORAL CARE MOUTH RINSE
15.0000 mL | Freq: Two times a day (BID) | OROMUCOSAL | Status: DC
  Administered 2017-01-05 – 2017-01-11 (×9): 15 mL via OROMUCOSAL

## 2017-01-04 MED ORDER — PRO-STAT SUGAR FREE PO LIQD
30.0000 mL | Freq: Every day | ORAL | Status: DC
  Administered 2017-01-05: 30 mL via ORAL
  Filled 2017-01-04: qty 30

## 2017-01-04 MED ORDER — OCTREOTIDE ACETATE 100 MCG/ML IJ SOLN
100.0000 ug | Freq: Three times a day (TID) | INTRAMUSCULAR | Status: DC
  Administered 2017-01-04 – 2017-01-12 (×23): 100 ug via SUBCUTANEOUS
  Filled 2017-01-04 (×26): qty 1

## 2017-01-04 NOTE — Progress Notes (Signed)
Jeffery [name]   DOB:December 31, 1950   ZO#:[id number]    Subjective: The patient is transferred out of the rehab unit to 6 E. Unfortunately, most of the orders I had placed yesterday did not get done. He tolerated 2 units of fresh frozen plasma well overnight without signs symptoms or symptoms of fluid overload. Urine output through the Foley catheter is minimum He complained of fatigue.  Denies chest pain or shortness of breath  Objective:  Vitals:   01/03/17 2320 01/04/17 0410  BP: 123/66 (!) 128/56  Pulse: 88 88  Resp: 18 18  Temp: 97.8 F (36.6 C) 97.9 F (36.6 C)     Intake/Output Summary (Last 24 hours) at 01/04/17 9811 Last data filed at 01/04/17 0200  Gross per 24 hour  Intake              787 ml  Output              300 ml  Net              487 ml    GENERAL:alert, no distress and comfortable SKIN: He looks extremely pale.  Multiple bruises are noted EYES: normal, Conjunctiva are pale and non-injected, sclera clear OROPHARYNX:no exudate, no erythema and lips, buccal mucosa, and tongue normal  NECK: supple, thyroid normal size, non-tender, without nodularity LYMPH:  no palpable lymphadenopathy in the cervical, axillary or inguinal LUNGS: clear to auscultation and percussion with normal breathing effort HEART: regular rate & rhythm and no murmurs ABDOMEN:abdomen soft, non-tender and grossly distended with ascites.  It appears somewhat worse compared to my examination last evening Musculoskeletal:no cyanosis of digits and no clubbing.  No bleeding from the left leg stump NEURO: alert & oriented x 3 with fluent speech, no focal motor/sensory deficits   Labs:  Lab Results  Component Value Date   WBC 4.4 01/04/2017   HGB 7.2 (L) 01/04/2017   HCT 21.6 (L) 01/04/2017   MCV 97.3 01/04/2017   PLT 67 (L) 01/04/2017   NEUTROABS 2.6 12/29/2016    Lab Results  Component Value Date   NA 130 (L) 01/04/2017   K 4.2 01/04/2017   CL 101 01/04/2017   CO2 21 (L) 01/04/2017     Studies:  Ir Paracentesis  Result Date: 01/03/2017 INDICATION: Patient with recurrent ascites. Request is made for therapeutic paracentesis of up to 1.5 liters. EXAM: ULTRASOUND GUIDED THERAPEUTIC PARACENTESIS MEDICATIONS: 10 mL 1% lidocaine COMPLICATIONS: None immediate. PROCEDURE: Informed written consent was obtained from the patient after a discussion of the risks, benefits and alternatives to treatment. A timeout was performed prior to the initiation of the procedure. Initial ultrasound scanning demonstrates a large amount of ascites within the right lateral abdomen. The right lateral abdomen was prepped and draped in the usual sterile fashion. 1% lidocaine was used for local anesthesia. Following this, a 19 gauge, 7-cm, Yueh catheter was introduced. An ultrasound image was saved for documentation purposes. The paracentesis was performed. The catheter was removed and a dressing was applied. The patient tolerated the procedure well without immediate post procedural complication. FINDINGS: A total of approximately 1.5 liters of clear, yellow fluid was removed. Samples were sent to the laboratory as requested by the clinical team. IMPRESSION: Successful ultrasound-guided paracentesis yielding 1.5 liters of peritoneal fluid. Read by:  Loyce Dys PA-C Electronically Signed   By: Jolaine Click M.D.   On: 01/03/2017 16:14    Assessment & Plan:   Anemia of chronic illness and secondary to chronic blood loss  and chronic kidney disease He has multifactorial anemia. The patient looks very pale Recommend close monitoring of CBC I recommend blood transfusion to keep hemoglobin greater than 8 I recommend consideration for ESA in the future We discussed some of the risks, benefits, and alternatives of blood transfusions. The patient is symptomatic from anemia and the hemoglobin level is critically low.  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, end-stage liver cirrhosis I recommend gentle transfusion support with fresh frozen plasma However, his urine output needs to be monitored closely to avoid complication related to volume overload Vitamin K is unlikely going to work given significant synthetic liver dysfunction He has received 2 units of FFP yesterday, on 12/24/2016 however, his coagulation studies are not done The goal would be to try to keep INR closer to 1.5 and below He may benefit from cryoprecipitate transfusion if Fibrinogen level is less than 200 Since he will be getting blood transfusions today, I do not want to give him too much blood products in case he gets into trouble with fluid overload I will recheck coagulation studies tomorrow  Recurrent ascites secondary to portal hypertension and decompensated liver failure, end stage liver disease The patient had evidence of third spacing and progressive liver failure His hepatic function has not been checked for some time Based on his last known liver function tests, the patient is at Child Pugh score C Based on most recent available numbers, his calculated MELD score is 29 His overall survival is poor I recommend GI consult to discuss whether this patient is a liver transplant candidate. If not, whether there are additional medications that could be used to optimize his outcome  Acute on chronic renal failure, ?  Hepatorenal syndrome The patient have clinical chronic kidney disease stage IV The patient has intravascular  volume depletion likely secondary to third spacing and poor oral intake Rather than IV fluid resuscitation, I think he would benefit greatly with intravascular volume expansion with transfusion of blood products Recommend close follow-up with nephrology I will recheck uric acid level tomorrow to see if he has hyperuricemia that might be contributing to worsening renal function His last serum uric acid level several weeks ago was very high at 9.9  History of osteomyelitis Continue wound dressing for now  Severe protein calorie malnutrition and severe cachexia Recommend full dietitian consult and consideration for appetite stimulant  Goals of care discussion The patient desires full code and everything done I felt that this is unrealistic; He has multiorgan failure with renal failure, liver failure, bone marrow failure and severe protein calorie malnutrition. He has multiple, recurrent hospitalization because of osteomyelitis and other medical complications I think once his symptoms have improved and bleeding had stopped, I think is reasonable to consult palliative care team for goals of care discussion  Discharge planning No plan for discharge right now due to unresolved medical issues I will follow closely   Artis Delay, MD 01/04/2017  6:52 AM

## 2017-01-04 NOTE — Progress Notes (Signed)
Initial Nutrition Assessment  DOCUMENTATION CODES:   Severe malnutrition in context of acute illness/injury, Obesity unspecified  INTERVENTION:  Provide Ensure Enlive po TID, each supplement provides 350 kcal and 20 grams of protein.  Provide 30 ml Prostat po once daily, each supplement provides 100 kcal and 15 grams of protein.   Food preferences updated in Health Touch program.  Encourage adequate PO intake.   NUTRITION DIAGNOSIS:   Malnutrition (severe) related to acute illness as evidenced by energy intake < or equal to 50% for > or equal to 5 days, moderate depletion of body fat, moderate depletions of muscle mass.  GOAL:   Patient will meet greater than or equal to 90% of their needs  MONITOR:   PO intake, Supplement acceptance, Labs, Weight trends, Skin, I & O's  REASON FOR ASSESSMENT:   Consult Poor PO  ASSESSMENT:   66 y.o. male with medical history significant of retention, diabetes, end-stage liver disease, and was recently inpatient rehabilitation after amputation due to osteomyelitis in February. Patient was in inpatient rehabilitation when he began to bleed from his stump. Dr. Shella Spearing of hematology saw the patient and wanted the patient transferred to telemetry floor for FFP.   No recent meal completion recorded. Pt was consuming milk and cereal during time of visit. Pt reports abdominal discomfort has improved as pt s/p paracentesis yesterday with removal of 1.5 L. Pt is familiar to this RD was CIR admission. Pt was poor po during CIR admission with 25% intake at meals and varied Ensure supplement consumption. Pt had bouts of n/v as well. Pt with a 5.4% weight loss in 1 month, however weight los likely related to fluid status. Pt currently has Ensure ordered. Pt reports he would like to continue with them. RD to modify orders to TID as well as order Prostat to aid in caloric and protein needs. Food preferences were discussed. RD to make changes in Health Touch  program used by the kitchen. Pt educated on the importance of adequate protein intake. Pt expressed understanding.   Nutrition-Focused physical exam completed. Findings are mild to moderate fat depletion, mild to moderate muscle depletion, and moderate edema.   Labs and medications reviewed.   Diet Order:  Diet 2 gram sodium Room service appropriate? Yes; Fluid consistency: Thin  Skin:  Wound (see comment) (Stg 1 L hip, incision L leg)  Last BM:  4/18  Height:   Ht Readings from Last 1 Encounters:  12/27/16  (1.854 m)    Weight:   Wt Readings from Last 1 Encounters:  01/03/17 245 lb 14.4 oz (111.5 kg)    Ideal Body Weight:  71.6 kg (adjusted for L BKA, R AKA)  BMI:  There is no height or weight on file to calculate BMI.  Estimated Nutritional Needs:   Kcal:  2200-2400  Protein:  115-135 grams  Fluid:  Per MD  EDUCATION NEEDS:   Education needs addressed  Roslyn Smiling, MS, RD, LDN Pager # [phone] After hours/ weekend pager # [phone]

## 2017-01-04 NOTE — Consult Note (Signed)
Consultation Note Date: 01/04/2017   Patient Name: Jeffery [name]  DOB: 1951/01/01  MRN: [id number]  Age / Sex: 66 y.o., male  PCP: Jeffery Prince, MD Referring Physician: Rodolph Bong, MD  Reason for Consultation: Establishing goals of care and Psychosocial/spiritual support  HPI/Patient Profile: 66 y.o. male   admitted on 01/03/2017 with past medical history significant of retention, diabetes, end-stage liver disease, and was recently inpatient rehabilitation after amputation due to osteomyelitis in February. He has had little progression and poor po intake. He has had multiple paracentesis for ascites.  Patient was in inpatient rehabilitation when he began to bleed from his stump.   Dr. Shella [name] of hematology saw the patient and wanted the patient transferred to telemetry floor for FFP.   Now he has poor renal function/hepatorenal ( not a dialysis candidate), is not a liver transplant candidate, severe malnutrition.  He has a very poor prognosis.   He faces advanced directive decisions and EOL wishes.  He is facing mortality.    Clinical Assessment and Goals of Care:  This NP Jeffery [name] reviewed medical records, received report from team, assessed the patient and then meet at the patient's bedside along with to discuss diagnosis, prognosis, GOC, EOL wishes disposition and options.  A  discussion was had today regarding advanced directives.  Concepts specific to code status  was had.   The difference between a aggressive medical intervention path  and a palliative comfort care path for this patient at this time was had.    Values and goals of care important to patient and family were attempted to be elicited.  Jeffery [name] was able to express his thoughts and feeling related to his current medical situation ( he knows his time is limited) but his desire for continued treatments is really based in his  existential beliefs.  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   He has continued his work over the past few months in spite of his illness.  He will need continued emotional support at this time in his life.  Natural trajectory and expectations at EOL were discussed.  Questions and concerns addressed.   Family encouraged to call with questions or concerns.  PMT will continue to support holistically.  I explained to Jeffery [name] that I would not be at work thru the week-end but certainly our team is there to support his needs.  I  then spoke to his sister Jeffery [name] in [place] by telephone to discuss the above, with patient's permission.  She appreciates the update, "my brother has a hard time telling me what is going on".  She wishes to be updated on a regular basis.  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   Apparently he did consent to a DNR in the past but today is remains a Full code   Palliative Prophylaxis:   Aspiration, Bowel Regimen, Delirium Protocol, Frequent Pain Assessment and Oral Care  Additional Recommendations (Limitations, Scope, Preferences):  Full Scope Treatment  Psycho-social/Spiritual:   Desire for further Chaplaincy support:no  Jeffery [name] and I enjoyed conversation as he shared his life review, he is  an interesting man.  He is an avid reader, [employer], and  DVDs .   He has not been back to his home since February   Prognosis:   Unable to determine- prognosis is poor  Discharge Planning: To  Be Determined      Primary Diagnoses: Present on Admission: . Coagulopathy (HCC) . Thrombocytopenia (HCC) . Cirrhosis of liver with ascites (HCC) . Osteomyelitis (HCC) . Acute renal failure (HCC) . Moderate malnutrition (HCC) . End stage liver disease (HCC) . 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 Depressed affect   I have reviewed the medical record, interviewed the patient and family, and examined the patient. The following aspects are pertinent.  Past Medical History:  Diagnosis Date  . Anemia   . Blurred vision    improved now  . Childhood asthma   . Cholelithiasis   . Eczema    inner wrist  . History of blood transfusion    "all related to anemia"  . Hypertension   . Muscle cramps   . Necrotizing fasciitis (HCC)   . Neuropathy   . Rash    wrist and hands since late june  . Retinal degeneration   . Stomach ulcer   . Type II diabetes mellitus (HCC)    Social History   Social History  . Marital status: Single    Spouse name: N/A  . Number of children: N/A  . Years of education: N/A   Social History Main Topics  . Smoking status: [name]  . Smokeless tobacco: [name]  . Alcohol use No  . Drug use: No  . Sexual activity: Not Asked     Comment: computer work. next of kin. Sister Jeffery [name] next of kin   Other Topics Concern  . None   Social History Narrative  . None   Family History  Problem Relation Age of Onset  . Cancer Mother     unknown type   Scheduled Meds: . feeding supplement (ENSURE ENLIVE)  237 mL Oral BID BM  . insulin aspart  0-5 Units Subcutaneous QHS  . insulin aspart  0-9 Units Subcutaneous TID WC  . lactulose  10 g Oral TID  . midodrine  15 mg Oral TID WC  . multivitamin with minerals  1 tablet Oral Daily  . octreotide  100 mcg Subcutaneous Q8H  . pantoprazole  40 mg Oral BID  . rifaximin  550 mg Oral BID  . saccharomyces boulardii  250 mg Oral BID  . sodium  chloride flush  3 mL Intravenous Q12H   Continuous Infusions: . sodium chloride    . sodium chloride     PRN Meds:.metoCLOPramide, oxyCODONE Medications Prior to Admission:  Prior to Admission medications   Medication Sig Start Date End Date Taking? Authorizing Provider  brimonidine (ALPHAGAN P) 0.1 % SOLN Place  1 drop into both eyes daily.    Historical Provider, MD  feeding supplement, ENSURE ENLIVE, (ENSURE ENLIVE) LIQD Take 237 mLs by mouth 2 (two) times daily between meals. 12/27/16   Clanford Cyndie Mull, MD  ipratropium-albuterol (DUONEB) 0.5-2.5 (3) MG/3ML SOLN  12/07/16   Historical Provider, MD  lactulose (CHRONULAC) 10 GM/15ML solution Take 15 mLs (10 g total) by mouth 3 (three) times daily. 12/27/16   Clanford Cyndie Mull, MD  loperamide (IMODIUM) 2 MG capsule Take 1 capsule (2 mg total) by mouth as needed for diarrhea or loose stools. 11/07/16   Rolly Salter, MD  midodrine (PROAMATINE) 5 MG tablet Take 3 tablets (15 mg total) by mouth 3 (three) times daily with meals. 12/27/16   Clanford Cyndie Mull, MD  Multiple Vitamin (MULTIVITAMIN WITH MINERALS) TABS tablet Take 1 tablet by mouth daily.    Historical Provider, MD  pantoprazole (PROTONIX) 40 MG tablet Take 1 tablet (40 mg total) by mouth 2 (two) times daily. 11/07/16   Rolly Salter, MD  rifaximin (XIFAXAN) 550 MG TABS tablet Take 1 tablet (550 mg total) by mouth 2 (two) times daily. 12/27/16   Clanford Cyndie Mull, MD  saccharomyces boulardii (FLORASTOR) 250 MG capsule Take 1 capsule (250 mg total) by mouth 2 (two) times daily. 11/07/16   Rolly Salter, MD  sucralfate (CARAFATE) 1 GM/10ML suspension Take 10 mLs (1 g total) by mouth 4 (four) times daily -  with meals and at bedtime. Patient taking differently: Take 1 g by mouth 4 (four) times daily -  before meals and at bedtime.  11/07/16   Rolly Salter, MD   No Known Allergies Review of Systems  Constitutional: Positive for activity change and fatigue.  Gastrointestinal: Positive for  abdominal distention.  Neurological: Positive for weakness.    Physical Exam  Constitutional: He is oriented to person, place, and time. He appears cachectic. He appears ill.  HENT:  Mouth/Throat: Oropharynx is clear and moist.  Cardiovascular: Normal rate, regular rhythm and normal heart sounds.   Pulmonary/Chest: He has decreased breath sounds in the right lower field and the left lower field.  Abdominal: He exhibits ascites.  Neurological: He is alert and oriented to person, place, and time.  Skin: Skin is warm and dry.    Vital Signs: BP (!) 128/56 (BP Location: Right Arm)   Pulse 88   Temp 97.9 F (36.6 C) (Oral)   Resp 18   SpO2 99%  Pain Assessment: 0-10   Pain Score: Asleep   SpO2: SpO2: 99 % O2 Device:SpO2: 99 % O2 Flow Rate: .   IO: Intake/output summary:  Intake/Output Summary (Last 24 hours) at 01/04/17 1011 Last data filed at 01/04/17 0200  Gross per 24 hour  Intake              787 ml  Output              300 ml  Net              487 ml    LBM: Last BM Date: 01/03/17 Baseline Weight:   Most recent weight:       Palliative Assessment/Data:  30 % at best    Time In: 0930 Time Out: 1045 Time Total: 75 min Greater than 50%  of this time was spent counseling and coordinating care related to the above assessment and plan.  Signed by: Jeffery Creed, NP   Please contact Palliative Medicine Team phone at [phone] for  questions and concerns.  For individual provider: See Amion             

## 2017-01-04 NOTE — Progress Notes (Signed)
[facility] Progress Note    Assessment/ Plan:   1 Acute kidney injury superimposed on CKD G3: Initial exacerbation likely due to blood loss anemia and unstable hemodynamics.  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  He is on midodrine and he's getting colloid replacement with blood products.  I think that it's worth trying to put octreotide back on to see if this helps-- but I forsee this being an unrelenting problem.  We had a very frank discussion about the progression of HRS and how it could result in death.  As of now, he's certainly not a liver transplant candidate based on his deconditioning and recent BKA.  I fully agree with the palliative care consult.  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  h/o HE on lactulose.  Had a 1.5L paracentesis yesterday.  I don't recommend LVPs.  Per pt, he was never a drinker, ? NAFLD.  Hasn't had a liver biopsy.  Transplant discussion as above.   3.  Anemia/ thrombocytopenia/ coagulopathy: all due to ESLD.  Heme following, transfusing as appropriate.   Fibrinogen low as expected in liver disease (I don't suspect DIC but will defer to expert heme judgement)  4 DM- per primary  5 PVD post new L BKA- hopefully correction of coagulopathy will help oozing from stump  6.  Dispo: now in acute care, pall care consulted.   Subjective:    Transferred from rehab last night for transfusion needs.  His creatinine continues to inch upwards.  He is still on midodrine.  Had a 1.5L paracentesis yesterday.     Objective:   BP (!) 128/56 (BP Location: Right Arm)   Pulse 88   Temp 97.9 F (36.6 C) (Oral)   Resp 18   SpO2 99%   Intake/Output Summary (Last 24 hours) at 01/04/17 0858 Last data filed at 01/04/17 0200  Gross per 24 hour  Intake              787 ml   Output              300 ml  Net              487 ml   Weight change:   Physical Exam: General appearance: alert and cooperative, pale HEENT: EOMI, no JVD GI: soft, non-tender; bowel sounds normal; no masses,  no organomegaly and large ascites  No asterixis Bilat amp;R AKA, L BKA wrapped   Imaging: Ir Paracentesis  Result Date: 01/03/2017 INDICATION: Patient with recurrent ascites. Request is made for therapeutic paracentesis of up to 1.5 liters. EXAM: ULTRASOUND GUIDED THERAPEUTIC PARACENTESIS MEDICATIONS: 10 mL 1% lidocaine COMPLICATIONS: None immediate. PROCEDURE: Informed written consent was obtained from the patient after a discussion of the risks, benefits and alternatives to treatment. A timeout was performed prior to the initiation of the procedure. Initial ultrasound scanning demonstrates a large amount of ascites within the right lateral abdomen. The right lateral abdomen was prepped and draped in the usual sterile fashion. 1% lidocaine was used for local anesthesia. Following this, a 19 gauge, 7-cm, Yueh catheter was introduced. An ultrasound image was saved for documentation purposes. The paracentesis was performed. The catheter was removed and a dressing was applied. The patient tolerated the procedure well without immediate post procedural complication. FINDINGS: A total of approximately 1.5 liters of clear,  yellow fluid was removed. Samples were sent to the laboratory as requested by the clinical team. IMPRESSION: Successful ultrasound-guided paracentesis yielding 1.5 liters of peritoneal fluid. Read by:  Loyce Dys PA-C Electronically Signed   By: Jolaine Click M.D.   On: 01/03/2017 16:14    Labs: BMET  Recent Labs Lab 12/29/16 1032 12/31/16 0503 01/01/17 0256 01/02/17 0636 01/04/17 0437  NA 130* 132* 132* 132* 130*  K 4.3 4.6 4.4 4.6 4.2  CL 103 103 104 104 101  CO2 19* 23 22 21* 21*  GLUCOSE 145* 137* 117* 91 94  BUN 49* 56* 54* 57* 57*  CREATININE 2.84*  3.23* 3.35* 3.33* 3.50*  CALCIUM 8.2* 8.3* 8.1* 8.2* 8.5*  PHOS 3.0 3.7  --   --   --    CBC  Recent Labs Lab 12/29/16 1131 12/31/16 0503 01/02/17 0636 01/04/17 0437  WBC 5.0 3.9* 5.6 4.4  NEUTROABS 2.6  --   --   --   HGB 7.7* 7.2* 8.0* 7.2*  HCT 23.2* 21.4* 23.1* 21.6*  MCV 98.3 96.4 95.5 97.3  PLT 68* 54* 67* 67*    Medications:    . feeding supplement (ENSURE ENLIVE)  237 mL Oral BID BM  . insulin aspart  0-5 Units Subcutaneous QHS  . insulin aspart  0-9 Units Subcutaneous TID WC  . lactulose  10 g Oral TID  . midodrine  15 mg Oral TID WC  . multivitamin with minerals  1 tablet Oral Daily  . octreotide  100 mcg Subcutaneous Q8H  . pantoprazole  40 mg Oral BID  . rifaximin  550 mg Oral BID  . saccharomyces boulardii  250 mg Oral BID  . sodium chloride flush  3 mL Intravenous Q12H      Bufford Buttner MD 01/04/2017, 8:58 AM

## 2017-01-04 NOTE — Patient Care Conference (Signed)
Inpatient RehabilitationTeam Conference and Plan of Care Update Date: 01/03/2017   Time: 10:45 AM    Patient Name: Jeffery [name]      Medical Record Number: [id number]  Date of Birth: [birth year] Sex: Male         Room/Bed: 4W03C/4W03C-01 Payor Info: Payor: BLUE CROSS BLUE SHIELD / Plan: [employer] PPO / Product Type: *No Product type* /    Admitting Diagnosis: N V AND SOB  Admit Date/Time:  12/27/2016  4:54 PM Admission Comments: No comment available   Primary Diagnosis:  Unilateral complete BKA, left, initial encounter (HCC) Principal Problem: Unilateral complete BKA, left, initial encounter [facility])  Patient Active Problem List   Diagnosis Date Noted  . Pain   . Unilateral complete BKA, left, initial encounter (HCC) 12/27/2016  . Abnormality of gait   . Hypotension   . Type 2 diabetes mellitus with peripheral neuropathy (HCC)   . Abscess of left foot 12/22/2016  . Septic arthritis of left foot (HCC) 12/22/2016  . CKD (chronic kidney disease), stage III 12/18/2016  . Hyponatremia 12/18/2016  . Hyperkalemia 12/18/2016  . 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 Depressed affect 12/13/2016  . Leg edema 11/24/2016  . Cirrhosis of liver (HCC)   . Multiple duodenal ulcers   . Ulcer of esophagus without bleeding   . Melena   . Chronic liver failure without hepatic coma (HCC)   . Advance care planning   . Goals of care, counseling/discussion   . Palliative care by specialist   . End stage liver disease (HCC)   . Moderate single current episode of major depressive disorder (HCC)   . Anxiety state   . Diaper candidiasis   . Somnolence   . Moderate malnutrition (HCC)   . Noninfectious gastroenteritis   . Gastrointestinal hemorrhage   . Diabetic foot ulcer (HCC)   . Osteomyelitis of left foot (HCC)   . Acute renal failure (HCC)   . Abdominal pain   . Diarrhea   . Osteomyelitis (HCC)   . C. difficile  colitis 10/23/2016  . AKI (acute kidney injury) (HCC)   . General weakness   . Gastrointestinal hemorrhage with melena   . Idiopathic hypotension   . Other hyperlipidemia   . Esophageal varices without bleeding (HCC)   . Portal hypertensive gastropathy (HCC)   . Cirrhosis of liver with ascites (HCC) 05/12/2016  . Special screening for malignant neoplasms, colon 05/12/2016  . Coagulopathy (HCC) 04/28/2016  . Hemolytic anemia (HCC) 03/24/2016  . G6PD deficiency anemia (HCC) 03/24/2016  . Thrombocytopenia (HCC) 03/24/2016  . Generalized psoriasis 03/20/2016  . Gilbert syndrome 03/20/2016  . Pancytopenia, acquired (HCC) 03/20/2016  . Hematemesis without nausea 03/20/2016    Expected Discharge Date: Expected Discharge Date: 01/19/17  Team Members Present: Physician leading conference: Dr. Claudette Laws Social Worker Present: Dossie Der, LCSW Nurse Present: Ronny Bacon, RN PT Present: Grier Rocher, PT;Rosita Dechalus, PTA OT Present: Johnsie Cancel, OT SLP Present: Reuel Derby, SLP PPS Coordinator present : Tora Duck, RN, CRRN     Current Status/Progress Goal Weekly Team Focus  Medical   Coagulopathy with anemia, GI blood loss  Reduce blood loss  manage Hepatic, renal and hematologic issues   Bowel/Bladder   Not voiding, In/out catheterization Q8hrs with small amount of urine, no bowel issues  To be able to urinate adequate amount of urine  closely monitor kidney and bladder function    Swallow/Nutrition/ Hydration  ADL's   Min A Bathing/dressing from bed level; +2 sliding board transfers; total A toileting  Set at Mod I overall; will likely have to downgrade due to pt progress/ medical  Activity tolerance; UE strengthening; transfers   Mobility   supervision rolling L/R, modA bed mobilty, maxA to total assist SB transfer limited activity tolerance  mod I basic, S car tr, mod I w/c x 250' controlled, S w/c x 250' community  SB transfers, activity tolerance    Communication             Safety/Cognition/ Behavioral Observations            Pain   Intermittent pain in the left stump  Assess and manage pain with pain scale of 0-2  Pain management   Skin   Large amount of bloody drainage from left stump incision, skin tear to left buttocks covered with pink foam  Control bleeding from stump, prevent further skin tears and skin breakdown  Continue to monitor stump incision and wounds      *See Care Plan and progress notes for long and short-term goals.  Barriers to Discharge: very debilitated    Possible Resolutions to Barriers:  Cont rehab, nephro consult, may need hematology eval    Discharge Planning/Teaching Needs:  Home with assist of grad student and friends. Due to [facility] will not eligible to go back to a NH from here.      Team Discussion:  Progressing slowly with his medical issues-to be tapped today for fluid. I & O cathed every 8 hr. Renal and GI following. Pain due to discomfort with fluid. Slow drainage form stump. Working activity tolerance. neuro-psych seeing for coping. Diet liberalized to increased po intake. Downgraded goals to min assist wheelchair level due to progress. Question how realistic pt is with progress and dc home.  Revisions to Treatment Plan:  15/7 due to endurance and downgraded goals to min assist wheelchair level   Continued Need for Acute Rehabilitation Level of Care: The patient requires daily medical management by a physician with specialized training in physical medicine and rehabilitation for the following conditions: Daily direction of a multidisciplinary physical rehabilitation program to ensure safe treatment while eliciting the highest outcome that is of practical value to the patient.: Yes Daily medical management of patient stability for increased activity during participation in an intensive rehabilitation regime.: Yes Daily analysis of laboratory values and/or radiology reports with any subsequent  need for medication adjustment of medical intervention for : Post surgical problems;Mood/behavior problems;Neurological problems  Lucy Chris 01/04/2017, 12:04 PM

## 2017-01-04 NOTE — Progress Notes (Signed)
PROGRESS NOTE    Jeffery [name]  JXB:[id number] DOB: 12-17-1950 DOA: 01/03/2017 PCP: Julian Hy, MD    Brief Narrative:  Patient is a 15 show gentleman history of diabetes, end-stage liver disease/cirrhosis, anemia, thrombocytopenia, status post recent left BKA secondary to osteomyelitis who was admitted from the inpatient rehabilitation when his stump began to go some blood and patient noted to be anemic and thrombocytopenic. Patient was seen by hematology/oncology were recommended that patient be admitted to the acute hospital for transfusion of FFP, packed red blood cells, management of decompensated cirrhosis and ongoing management.  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 Orders have been written for the 2 unit transfusion of packed red blood cells with goal hemoglobin greater than 8 per hematology. Transfusion pending. Hematology/oncology following.  #2 chronic thrombocytopenia Secondary to cirrhosis, splenomegaly per hematology oncology. Patient was transfused 2 units of FFP on 01/03/2017. Follow.  #3 recurrent ascites secondary to portal hypertension and decompensated liver failure/end-stage liver disease Patient noted to have third spacing due to progressive liver disease. Patient noted to have a MELD score of 29. Patient status post ultrasound guided paracenteses from  right lateral abdomen with 1.5 L maximum removed of clear yellow fluid per interventional radiology on 01/03/2017 prior to transfer from inpatient rehabilitation. Continue PPI, Xifaxan. Consult with GI for further evaluation and recommendations.  #4 coagulopathy Secondary to end-stage liver disease/cirrhosis. Patient status post 2 units FFP 01/03/2017. Due to significant  li synthetic liver dysfunction, it is felt per hematology the patient likely will not benefit from vitamin K. Goal INR per hematology INR close to 1.5 and below. Per hematology/oncology.  #5 acute on chronic kidney disease stage III Likely secondary to prerenal azotemia in the setting of anemia and hemodynamic instability. Also concern for hepatorenal syndrome. Patient with worsening renal function. Patient currently on midodrine. Patient has been started back octreotide per nephrology. Patient to be transfused 2 units packed red blood cells today. Concern for hepatorenal syndrome. Nephrology following.  #6 peripheral vascular disease status post left BKA 12/23/2016 per Dr. Ophelia Charter   stump site noted to be bleeding prior to admission. Patient status post 2 units FFP. Patient be transfused 2 units packed red blood cells. Spoke with Dr. Ophelia Charter and patient to receive Betadine swab to incision at time of dressing change and dry dressing changes daily with Ace wrap.  #7 diabetes mellitus CBGs have ranged from 85 -102. Hemoglobin A1c was 5.8 on 10/24/2016. Sliding scale insulin.  #8 severe protein calorie malnutrition/ severe cachexia Dietitian following.  #9 prognosis Patient with a poor prognosis. Patient with cirrhosis of the liver with decompensation requiring paracenteses, anemic and thrombocytopenic with a coagulopathy status post 2 units FFP and being transfused 2 units packed red blood cells. Patient debilitated. Patient status post recent left BKA with recent labs in no blood after some prophylactic heparin. Patient with a meld  score of 29. Patient also with severe protein calorie malnutrition. Palliative care consultation for goals of care.     DVT prophylaxis: Patient with right AKA, recent left BKA, anemic and thrombocytopenic. Code Status: Full Family Communication: Updated patient. No family at bedside. Disposition Plan: Pending hospitalization and pending palliative care consultation and other consultants.   Consultants:   Hematology/oncology Dr.Gorsuch   Gastroenterology pending 01/04/2017  Palliative care pending 01/04/2017  Nephrology Dr. Signe Colt  Procedures:   2 units FFP 01/03/2017  2 units packed red blood cells pending 01/04/2017  Antimicrobials:   None   Subjective: Patient sitting up in bed. Patient denies any shortness of breath. No chest pain. Patient still with some abdominal pain. Patient denies any bleeding. Patient states he's doing as best as he can.  Objective: Vitals:   01/03/17 2212 01/03/17 2320 01/04/17 0410 01/04/17 1017  BP: 117/61 123/66 (!) 128/56 114/72  Pulse: 86 88 88 89  Resp: Temp: 98.1 F (36.7 C) 97.8 F (36.6 C) 97.9 F (36.6 C) 97.7 F (36.5 C)  TempSrc: Oral Oral Oral Oral  SpO2: 99% 99% 99% 99%    Intake/Output Summary (Last 24 hours) at 01/04/17 1334 Last data filed at 01/04/17 0200  Gross per 24 hour  Intake              787 ml  Output              300 ml  Net              487 ml   There were no vitals filed for this visit.  Examination:  General exam: Appears calm and comfortable, Pallor.  Respiratory system: Clear to auscultation. Decreased breath sounds in the bases. Respiratory effort normal. Cardiovascular system: S1 & S2 heard, RRR. No JVD, murmurs, rubs, gallops or clicks. No pedal edema. Gastrointestinal system: Abdomen is distended, soft and some tenderness to palpation in the right upper quadrant. Hepatomegaly. Normal bowel sounds heard. Central nervous system: Alert and oriented. No focal neurological  deficits. Extremities: left BKA with stump dressing. Right AKA  Skin: No rashes, lesions or ulcers Psychiatry: Judgement and insight appear normal. Mood & affect appropriate.     Data Reviewed: I have personally reviewed following labs and imaging studies  CBC:  Recent Labs Lab 12/29/16 1131 12/31/16 0503 01/02/17 0636 01/04/17 0437  WBC 5.0 3.9* 5.6 4.4  NEUTROABS 2.6  --   --   --   HGB 7.7* 7.2* 8.0* 7.2*  HCT 23.2* 21.4* 23.1* 21.6*  MCV 98.3 96.4 95.5 97.3  PLT 68* 54* 67* 67*   Basic Metabolic Panel:  Recent Labs Lab 12/29/16 1032 12/31/16 0503 01/01/17 0256 01/02/17 0636 01/04/17 0437  NA 130* 132* 132* 132* 130*  K 4.3 4.6 4.4 4.6 4.2  CL 103 103 104 104 101  CO2 19* 23 22 21* 21*  GLUCOSE 145* 137* 117* 91 94  BUN 49* 56* 54* 57* 57*  CREATININE 2.84* 3.23* 3.35* 3.33* 3.50*  CALCIUM 8.2* 8.3* 8.1* 8.2* 8.5*  PHOS 3.0 3.7  --   --   --    GFR: Estimated Creatinine Clearance: 27.2 mL/min (A) (by C-G formula based on SCr of 3.5 mg/dL (H)). Liver Function Tests:  Recent Labs Lab 12/29/16 1032 12/31/16 0503 01/04/17 0729  AST  --   --  37  ALT  --   --  13*  ALKPHOS  --   --  70  BILITOT  --   --  2.9*  PROT  --   --  7.3  ALBUMIN 2.0* 1.8* 1.9*   No results for input(s): LIPASE, AMYLASE in the last 168 hours. No results for input(s): AMMONIA in the last 168 hours. Coagulation Profile:  Recent Labs Lab 12/29/16 1130 01/01/17 0735 01/02/17 0636 01/03/17 0628  INR 2.36 2.21 2.25 2.20   Cardiac Enzymes: No results for input(s): CKTOTAL, CKMB, CKMBINDEX, TROPONINI in the last 168 hours. BNP (last 3 results) No results for input(s): PROBNP in the last 8760 hours. HbA1C: No results for input(s): HGBA1C in the last 72 hours. CBG:  Recent Labs Lab 01/03/17 1122 01/03/17 1636 01/03/17 2110 01/04/17 0801 01/04/17 1143  GLUCAP 125* 114* 102* 85 96   Lipid Profile: No results for input(s): CHOL, HDL, LDLCALC, TRIG, CHOLHDL, LDLDIRECT  in the last 72 hours. Thyroid Function Tests: No results for input(s): TSH, T4TOTAL, FREET4, T3FREE, THYROIDAB in the last 72 hours. Anemia Panel: No results for input(s): VITAMINB12, FOLATE, FERRITIN, TIBC, IRON, RETICCTPCT in the last 72 hours. Sepsis Labs: No results for input(s): PROCALCITON, LATICACIDVEN in the last 168 hours.  Recent Results (from the past 240 hour(s))  Fungus Culture With Stain     Status: None (Preliminary result)   Collection Time: 12/29/16  1:26 PM  Result Value Ref Range Status   Fungus Stain Final report  Final    Comment: (NOTE) Performed At: [facility] [address] [place], [place] [id number] Mila Homer MD WU:[id number]    Fungus (Mycology) Culture PENDING  Incomplete   Fungal Source FLUID  Final  Culture, body fluid-bottle     Status: None   Collection Time: 12/29/16  1:26 PM  Result Value Ref Range Status   Specimen Description FLUID  Final   Special Requests PERITONEAL  Final   Culture NO GROWTH 5 DAYS  Final   Report Status 01/03/2017 FINAL  Final  Gram stain     Status: None   Collection Time: 12/29/16  1:26 PM  Result Value Ref Range Status   Specimen Description FLUID  Final   Special Requests PERITONEAL  Final   Gram Stain NO WBC SEEN NO ORGANISMS SEEN   Final   Report Status 12/29/2016 FINAL  Final  Fungus Culture Result     Status: None   Collection Time: 12/29/16  1:26 PM  Result Value Ref Range Status   Result 1 Comment  Final    Comment: (NOTE) KOH/Calcofluor preparation:  no fungus observed. Performed At: [facility] [address] [place], [place] [id number] Mila Homer MD VH:[id number]   Culture, Urine     Status: None   Collection Time: 12/31/16  2:41 PM  Result Value Ref Range Status   Specimen Description URINE, CATHETERIZED  Final   Special Requests NONE  Final   Culture NO GROWTH  Final   Report Status 01/01/2017 FINAL  Final         Radiology Studies: Ir  Paracentesis  Result Date: 01/03/2017 INDICATION: Patient with recurrent ascites. Request is made for therapeutic paracentesis of up to 1.5 liters. EXAM: ULTRASOUND GUIDED THERAPEUTIC PARACENTESIS MEDICATIONS: 10 mL 1% lidocaine COMPLICATIONS: None immediate. PROCEDURE: Informed written consent was obtained from the patient after a discussion of the risks, benefits and alternatives to treatment. A timeout was performed prior to the initiation of the procedure. Initial ultrasound scanning demonstrates a large amount of ascites within the right lateral abdomen. The right lateral abdomen was prepped and draped in the usual sterile fashion. 1% lidocaine  was used for local anesthesia. Following this, a 19 gauge, 7-cm, Yueh catheter was introduced. An ultrasound image was saved for documentation purposes. The paracentesis was performed. The catheter was removed and a dressing was applied. The patient tolerated the procedure well without immediate post procedural complication. FINDINGS: A total of approximately 1.5 liters of clear, yellow fluid was removed. Samples were sent to the laboratory as requested by the clinical team. IMPRESSION: Successful ultrasound-guided paracentesis yielding 1.5 liters of peritoneal fluid. Read by:  Loyce Dys PA-C Electronically Signed   By: Jolaine Click M.D.   On: 01/03/2017 16:14        Scheduled Meds: . feeding supplement (ENSURE ENLIVE)  237 mL Oral BID BM  . insulin aspart  0-5 Units Subcutaneous QHS  . insulin aspart  0-9 Units Subcutaneous TID WC  . lactulose  10 g Oral TID  . midodrine  15 mg Oral TID WC  . multivitamin with minerals  1 tablet Oral Daily  . octreotide  100 mcg Subcutaneous Q8H  . pantoprazole  40 mg Oral BID  . rifaximin  550 mg Oral BID  . saccharomyces boulardii  250 mg Oral BID  . sodium chloride flush  3 mL Intravenous Q12H   Continuous Infusions: . sodium chloride    . sodium chloride       LOS: 1 day    Time spent: 40  mins    Bode Pieper, MD [facility] Pager [phone]  If 7PM-7AM, please contact night-coverage [email or web address] Password [email or web address] 01/04/2017, 1:34 PM

## 2017-01-04 NOTE — Consult Note (Signed)
Referring Provider: [facility]  Primary Care Physician:  Julian Hy, MD Primary Gastroenterologist: Wendall Papa, MD  Reason for Consultation:  Decompensated cirrhosis  ASSESSMENT AND PLAN:     66 year old male with cirrhosis complicated by portal HTN. Small varices on EGD in Feb. No focal liver lesions on ultrasound, normal AFP. His biggest problem has been that of ascites.  CKD has complicated management of the ascites and unfortunately his Cr. Is trending up. Nephrology following, concerned about HRS. Plan is to try Octreotide again. He is on midodrine.  -He has continued to get paracenteses but this may be too detrimental to kidneys going forward. Diuretics are being held. He is on a low sodium diet. -holding lactulose given recent diarrhea. Continue Xifaxan -Palliative Care is to see him.   Thrombocytopenia / coagulopathy / anemia. Suspect all from cirrhosis. Heme is following and transfusing. Just got FFP and blood ordered.   PUD. Superficial esophageal ulcers with stigmata of bleeding and mutliple oozing duodenal ulcers found on EGD mid Feb.  -continue PPI -I don't see that Carafate was continued, probably best given renal function   HPI: Ulices Maack is a 66 y.o. male with cirrhosis complicated by portal HTN. Patient was admitted in Feb during which time we saw him for dark heme positive stool and diarrhea..He was s/p recent right AKA for osteomyelitis and had decompensated from a liver standpoint with ascites and coagulopathy.  Cdiff was negative. He underwent LVP of 3 liters, no SBP. Inpatient EGD on 2/14 with findings of multiple superficial nonbleeding esophageal ulcers with stigmata or recent bleeding, severe portal hypertensive gastropathy with adherent blood, erosions , and multiple superficial oozing duodenal ulcers. Findings appeared to be NSAID related. Patient treated with PPI and carafate. He stabilized and was discharged to Rehab on 2/21.  He was  readmiited earlier this month with acute on chronic renal failure. Condition improved and he was sent back to Rehab on 4/11.   Patient has had a tough time at Rehab. He has continued to require LVP.  His PO intake has been poor, still having bouts of diarrhea. Nephrology has continued to follow him and stopped paracentesis for now. Patient has developed urinary retention and a few days ago he started bleeding from stump. hgb fell from 8.6 to 7.2 on 4/15. He was transfused a unit of blood on 4/15. He got IV Vitamin K x 3 days for coagulopathy. His Cr has continued to rise. Patient admitted for management of these acute issues. Currently he has no abdominal pain. He hasn't had diarrhea in two days.   Past Medical History:  Diagnosis Date  . Anemia   . Blurred vision    improved now  . Childhood asthma   . Cholelithiasis   . Eczema    inner wrist  . History of blood transfusion    "all related to anemia"  . Hypertension   . Muscle cramps   . Necrotizing fasciitis (HCC)   . Neuropathy   . Rash    wrist and hands since late june  . Retinal degeneration   . Stomach ulcer   . Type II diabetes mellitus (HCC)     Past Surgical History:  Procedure Laterality Date  . ABOVE KNEE LEG AMPUTATION     right  . AMPUTATION Left 12/23/2016   Procedure: AMPUTATION BELOW KNEE;  Surgeon: Eldred Manges, MD;  [facility] OR;  Service: Orthopedics;  Laterality: Left;  . APPLICATION OF WOUND VAC Left 11/06/2016  Procedure: APPLICATION OF WOUND VAC;  SuEldred Mangesrk C Yates, MD;  Location: [facility] OR;  Service: Orthopedics;  Laterality: Left;  . CATARACT EXTRACTION W/ INTRAOCULAR LENS  IMPLANT, BILATERAL Bilateral   . COLONOSCOPY WITH PROPOFOL N/A 05/18/2016   Procedure: COLONOSCOPY WITH PROPOFOL;  Surgeon: Rachael Fee, MD;  Location: [facility] ENDOSCOPY;  Service: Endoscopy;  Laterality: N/A;  . ESOPHAGOGASTRODUODENOSCOPY (EGD) WITH PROPOFOL N/A 05/18/2016   Procedure: ESOPHAGOGASTRODUODENOSCOPY (EGD) WITH PROPOFOL;   Surgeon: Rachael Fee, MD;  Location: [facility] ENDOSCOPY;  Service: Endoscopy;  Laterality: N/A;  . ESOPHAGOGASTRODUODENOSCOPY (EGD) WITH PROPOFOL N/A 11/01/2016   Procedure: ESOPHAGOGASTRODUODENOSCOPY (EGD) WITH PROPOFOL;  Surgeon: Napoleon Form, MD;  Location: [facility] ENDOSCOPY;  Service: Endoscopy;  Laterality: N/A;  . EYE SURGERY  09/2011   bilateral for retinopathy - laser eye surgery  . FOOT FRACTURE SURGERY Left 2003   5th metatarsal; "put 3 screws in it"  . I&D EXTREMITY Left 10/25/2016   Procedure: LEFT FOOT DEBRIDEMENT RAY AMPUTATION PLACEMENT OF WOUND VAC;  Surgeon: Eldred Manges, MD;  Location: [facility] OR;  Service: Orthopedics;  Laterality: Left;  . I&D EXTREMITY Left 11/06/2016   Procedure: IRRIGATION AND DEBRIDEMENT EXTREMITY/LEFT FOOT;  Surgeon: Eldred Manges, MD;  Location: [facility] OR;  Service: Orthopedics;  Laterality: Left;  . IR PARACENTESIS  12/20/2016  . IR PARACENTESIS  12/29/2016  . IR PARACENTESIS  01/03/2017  . TOE AMPUTATION Left    big toe    Prior to Admission medications   Medication Sig Start Date End Date Taking? Authorizing Provider  brimonidine (ALPHAGAN P) 0.1 % SOLN Place 1 drop into both eyes daily.    Historical Provider, MD  feeding supplement, ENSURE ENLIVE, (ENSURE ENLIVE) LIQD Take 237 mLs by mouth 2 (two) times daily between meals. 12/27/16   Clanford Cyndie Mull, MD  ipratropium-albuterol (DUONEB) 0.5-2.5 (3) MG/3ML SOLN  12/07/16   Historical Provider, MD  lactulose (CHRONULAC) 10 GM/15ML solution Take 15 mLs (10 g total) by mouth 3 (three) times daily. 12/27/16   Clanford Cyndie Mull, MD  loperamide (IMODIUM) 2 MG capsule Take 1 capsule (2 mg total) by mouth as needed for diarrhea or loose stools. 11/07/16   Rolly Salter, MD  midodrine (PROAMATINE) 5 MG tablet Take 3 tablets (15 mg total) by mouth 3 (three) times daily with meals. 12/27/16   Clanford Cyndie Mull, MD  Multiple Vitamin (MULTIVITAMIN WITH MINERALS) TABS tablet Take 1 tablet by mouth daily.    Historical Provider,  MD  pantoprazole (PROTONIX) 40 MG tablet Take 1 tablet (40 mg total) by mouth 2 (two) times daily. 11/07/16   Rolly Salter, MD  rifaximin (XIFAXAN) 550 MG TABS tablet Take 1 tablet (550 mg total) by mouth 2 (two) times daily. 12/27/16   Clanford Cyndie Mull, MD  saccharomyces boulardii (FLORASTOR) 250 MG capsule Take 1 capsule (250 mg total) by mouth 2 (two) times daily. 11/07/16   Rolly Salter, MD  sucralfate (CARAFATE) 1 GM/10ML suspension Take 10 mLs (1 g total) by mouth 4 (four) times daily -  with meals and at bedtime. Patient taking differently: Take 1 g by mouth 4 (four) times daily -  before meals and at bedtime.  11/07/16   Rolly Salter, MD    Current Facility-Administered Medications  Medication Dose Route Frequency Provider Last Rate Last Dose  . 0.9 %  sodium chloride infusion   Intravenous Once [employer], DO      . 0.9 %  sodium chloride infusion  Intravenous Once Artis Delay, MD      . feeding supplement (ENSURE ENLIVE) (ENSURE ENLIVE) liquid 237 mL  237 mL Oral BID BM Jessica U Vann, DO      . insulin aspart (novoLOG) injection 0-5 Units  0-5 Units Subcutaneous QHS Jessica U Vann, DO      . insulin aspart (novoLOG) injection 0-9 Units  0-9 Units Subcutaneous TID WC Jessica U Vann, DO      . lactulose (CHRONULAC) 10 GM/15ML solution 10 g  10 g Oral TID Joseph Art, DO      . metoCLOPramide (REGLAN) tablet 5 mg  5 mg Oral Q6H PRN Joseph Art, DO   5 mg at 01/04/17 0356  . midodrine (PROAMATINE) tablet 15 mg  15 mg Oral TID WC Jessica U Vann, DO   15 mg at 01/04/17 1037  . multivitamin with minerals tablet 1 tablet  1 tablet Oral Daily Joseph Art, DO   1 tablet at 01/04/17 1037  . octreotide (SANDOSTATIN) injection 100 mcg  100 mcg Subcutaneous Q8H Bufford Buttner, MD      . oxyCODONE (Oxy IR/ROXICODONE) immediate release tablet 5 mg  5 mg Oral Q4H PRN Joseph Art, DO   5 mg at 01/03/17 2324  . pantoprazole (PROTONIX) EC tablet 40 mg  40 mg Oral BID Joseph Art,  DO   40 mg at 01/04/17 1036  . rifaximin (XIFAXAN) tablet 550 mg  550 mg Oral BID Joseph Art, DO   550 mg at 01/04/17 1036  . saccharomyces boulardii (FLORASTOR) capsule 250 mg  250 mg Oral BID Joseph Art, DO   250 mg at 01/04/17 1037  . sodium chloride flush (NS) 0.9 % injection 3 mL  3 mL Intravenous Q12H Jessica U Vann, DO   3 mL at 01/04/17 1038    Allergies as of 01/03/2017  . (No Known Allergies)    Family History  Problem Relation Age of Onset  . Cancer Mother     unknown type    Social History   Social History  . Marital status: Single    Spouse name: N/A  . Number of children: N/A  . Years of education: N/A   Occupational History  . Not on file.   Social History Main Topics  . Smoking status: Never Smoker  . Smokeless tobacco: Never Used  . Alcohol use No  . Drug use: No  . Sexual activity: Not on file     Comment: computer work. next of kin. Sister Marisue Humble next of kin   Other Topics Concern  . Not on file   Social History Narrative  . No narrative on file    Review of Systems: All systems reviewed and negative except where noted in HPI.  Physical Exam: Vital signs in last 24 hours: Temp:  [97.7 F (36.5 C)-98.6 F (37 C)] 97.7 F (36.5 C) (04/19 1017) Pulse Rate:  [83-98] 89 (04/19 1017) Resp:  [16-18] 18 (04/19 1017) BP: (107-128)/(50-72) 114/72 (04/19 1017) SpO2:  [97 %-100 %] 99 % (04/19 1017) Weight:  [245 lb 14.4 oz (111.5 kg)] 245 lb 14.4 oz (111.5 kg) (04/18 1332) Last BM Date: 01/03/17 General:   Alert,  Pale, white male in NAD Eyes:  Sclera clear, no icterus.   Conjunctiva pale. Ears:  Normal auditory acuity. Nose:  No deformity, discharge,  or lesions.  Neck:  Supple; no masses  Lungs:  Congested cough but lungs clear throughout to auscultation.  No wheezes, crackles, or rhonchi.  Heart:  Regular rate and rhythm; no murmurs Abdomen:  Soft, large bulge right abdomen. nontender, BS active,   Rectal:  Deferred  Msk:   Symmetrical without gross deformities. . Pulses:  Normal pulses noted. Extremities:  Left BKA with stump dressing,  Right AKA.  Neurologic:  Alert and  oriented x4;  grossly normal neurologically.No asterixis Skin:  Intact without significant lesions or rashes.. Psych:  Alert and cooperative. Normal mood and affect.  Intake/Output from previous day: 04/18 0701 - 04/19 0700 In: 787 [P.O.:260; Blood:527] Out: 300 [Urine:300] Intake/Output this shift: No intake/output data recorded.  Lab Results:  Recent Labs  01/02/17 0636 01/04/17 0437  WBC 5.6 4.4  HGB 8.0* 7.2*  HCT 23.1* 21.6*  PLT 67* 67*   BMET  Recent Labs  01/02/17 0636 01/04/17 0437  NA 132* 130*  K 4.6 4.2  CL 104 101  CO2 21* 21*  GLUCOSE 91 94  BUN 57* 57*  CREATININE 3.33* 3.50*  CALCIUM 8.2* 8.5*   LFT  Recent Labs  01/04/17 0729  PROT 7.3  ALBUMIN 1.9*  AST 37  ALT 13*  ALKPHOS 70  BILITOT 2.9*  BILIDIR 0.7*  IBILI 2.2*   PT/INR  Recent Labs  01/02/17 0636 01/03/17 0628  LABPROT 25.3* 24.8*  INR 2.25 2.20   Hepatitis Panel No results for input(s): HEPBSAG, HCVAB, HEPAIGM, HEPBIGM in the last 72 hours.   Studies/Results: Ir Paracentesis  Result Date: 01/03/2017 INDICATION: Patient with recurrent ascites. Request is made for therapeutic paracentesis of up to 1.5 liters. EXAM: ULTRASOUND GUIDED THERAPEUTIC PARACENTESIS MEDICATIONS: 10 mL 1% lidocaine COMPLICATIONS: None immediate. PROCEDURE: Informed written consent was obtained from the patient after a discussion of the risks, benefits and alternatives to treatment. A timeout was performed prior to the initiation of the procedure. Initial ultrasound scanning demonstrates a large amount of ascites within the right lateral abdomen. The right lateral abdomen was prepped and draped in the usual sterile fashion. 1% lidocaine was used for local anesthesia. Following this, a 19 gauge, 7-cm, Yueh catheter was introduced. An ultrasound image was  saved for documentation purposes. The paracentesis was performed. The catheter was removed and a dressing was applied. The patient tolerated the procedure well without immediate post procedural complication. FINDINGS: A total of approximately 1.5 liters of clear, yellow fluid was removed. Samples were sent to the laboratory as requested by the clinical team. IMPRESSION: Successful ultrasound-guided paracentesis yielding 1.5 liters of peritoneal fluid. Read by:  Loyce Dys PA-C Electronically Signed   By: Jolaine Click M.D.   On: 01/03/2017 16:14     Willette Cluster, NP-C @  01/04/2017, 12:17 PM  Pager number [phone]  I have reviewed the entire case in detail with the above APP and discussed the plan in detail.  Therefore, I agree with the diagnoses recorded above. In addition,  I have personally interviewed and examined the patient and have personally reviewed any abdominal/pelvic CT scan images.  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 His encephalopathy is under good control with rifaximin.  His poor renal function is certainly the greatest threat to his life right now. 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 Treatment options seem to be slim, it seems unlikely as a dialysis candidate, and he is certainly not a liver transplant candidate for multiple reasons.  I will leave the management of his fluid and electrolytes to the renal service, we have no additional therapy is to offer for his underlying liver disease. I believe that all that can be done for his intermittent oozing from the surgical site is pressure and administration of FFP understanding that its effect is short-lived.  His prognosis is quite poor and I agree entirely with the palliative care consult. It is not clear to me that he has yet  grasped the severity of his illness or his prognosis.    Charlie Pitter III Pager [phone]  Mon-Fri 8a-5p [phone] after 5p, weekends, holidays

## 2017-01-05 DIAGNOSIS — E440 Moderate protein-calorie malnutrition: Secondary | ICD-10-CM

## 2017-01-05 DIAGNOSIS — E790 Hyperuricemia without signs of inflammatory arthritis and tophaceous disease: Secondary | ICD-10-CM

## 2017-01-05 LAB — COMPREHENSIVE METABOLIC PANEL
ALT: 11 U/L — AB (ref 17–63)
AST: 33 U/L (ref 15–41)
Albumin: 1.8 g/dL — ABNORMAL LOW (ref 3.5–5.0)
Alkaline Phosphatase: 72 U/L (ref 38–126)
Anion gap: 7 (ref 5–15)
BUN: 57 mg/dL — ABNORMAL HIGH (ref 6–20)
CHLORIDE: 102 mmol/L (ref 101–111)
CO2: 21 mmol/L — AB (ref 22–32)
CREATININE: 3.35 mg/dL — AB (ref 0.61–1.24)
Calcium: 8.3 mg/dL — ABNORMAL LOW (ref 8.9–10.3)
GFR calc non Af Amer: 18 mL/min — ABNORMAL LOW (ref >60)
GFR, EST AFRICAN AMERICAN: 21 mL/min — AB (ref >60)
Glucose, Bld: 125 mg/dL — ABNORMAL HIGH (ref 65–99)
POTASSIUM: 4.7 mmol/L (ref 3.5–5.1)
SODIUM: 130 mmol/L — AB (ref 135–145)
Total Bilirubin: 4 mg/dL — ABNORMAL HIGH (ref 0.3–1.2)
Total Protein: 7.4 g/dL (ref 6.5–8.1)

## 2017-01-05 LAB — BPAM RBC
BLOOD PRODUCT EXPIRATION DATE: 201804262359
Blood Product Expiration Date: 201805102359
ISSUE DATE / TIME: 201804191447
ISSUE DATE / TIME: 201804192056
UNIT TYPE AND RH: 600
Unit Type and Rh: 6200

## 2017-01-05 LAB — TYPE AND SCREEN
ABO/RH(D): A POS
ANTIBODY SCREEN: NEGATIVE
UNIT DIVISION: 0
Unit division: 0

## 2017-01-05 LAB — URIC ACID: URIC ACID, SERUM: 10.1 mg/dL — AB (ref 4.4–7.6)

## 2017-01-05 LAB — CBC WITH DIFFERENTIAL/PLATELET
Basophils Absolute: 0 10*3/uL (ref 0.0–0.1)
Basophils Relative: 1 %
EOS ABS: 0.4 10*3/uL (ref 0.0–0.7)
Eosinophils Relative: 9 %
HCT: 27.4 % — ABNORMAL LOW (ref 39.0–52.0)
Hemoglobin: 9.1 g/dL — ABNORMAL LOW (ref 13.0–17.0)
LYMPHS ABS: 0.7 10*3/uL (ref 0.7–4.0)
Lymphocytes Relative: 18 %
MCH: 31 pg (ref 26.0–34.0)
MCHC: 33.2 g/dL (ref 30.0–36.0)
MCV: 93.2 fL (ref 78.0–100.0)
MONO ABS: 0.5 10*3/uL (ref 0.1–1.0)
Monocytes Relative: 13 %
NEUTROS PCT: 59 %
Neutro Abs: 2.3 10*3/uL (ref 1.7–7.7)
PLATELETS: 73 10*3/uL — AB (ref 150–400)
RBC: 2.94 MIL/uL — AB (ref 4.22–5.81)
RDW: 21.7 % — AB (ref 11.5–15.5)
WBC: 3.9 10*3/uL — AB (ref 4.0–10.5)

## 2017-01-05 LAB — APTT: aPTT: 59 seconds — ABNORMAL HIGH (ref 24–36)

## 2017-01-05 LAB — FIBRINOGEN: FIBRINOGEN: 129 mg/dL — AB (ref 210–475)

## 2017-01-05 LAB — GLUCOSE, CAPILLARY
GLUCOSE-CAPILLARY: 168 mg/dL — AB (ref 65–99)
Glucose-Capillary: 107 mg/dL — ABNORMAL HIGH (ref 65–99)
Glucose-Capillary: 151 mg/dL — ABNORMAL HIGH (ref 65–99)

## 2017-01-05 LAB — PROTIME-INR
INR: 1.93
Prothrombin Time: 22.3 seconds — ABNORMAL HIGH (ref 11.4–15.2)

## 2017-01-05 MED ORDER — SODIUM CHLORIDE 0.9 % IV SOLN
3.0000 mg | Freq: Once | INTRAVENOUS | Status: AC
  Administered 2017-01-05: 3 mg via INTRAVENOUS
  Filled 2017-01-05: qty 2

## 2017-01-05 MED ORDER — SODIUM CHLORIDE 0.9 % IV SOLN
Freq: Once | INTRAVENOUS | Status: AC
  Administered 2017-01-05: 11:00:00 via INTRAVENOUS

## 2017-01-05 NOTE — Progress Notes (Signed)
2nd unit of PRBC infused, no signs of reaction, VS documented.  Patient tolerated well.

## 2017-01-05 NOTE — Progress Notes (Signed)
Jeffery [name]   DOB:[birth year]   MV#:[id number]    Subjective: He had recent nausea vomiting.  His felt his abdomen is more distended He denies further diarrhea Appetite remained poor He tolerated blood transfusion well.  Denies chest pain or shortness of breath  Objective:  Vitals:   01/05/17 0002 01/05/17 0659  BP: 120/66 111/63  Pulse: 77 81  Resp: 20 18  Temp: 97.7 F (36.5 C) 97.7 F (36.5 C)     Intake/Output Summary (Last 24 hours) at 01/05/17 0759 Last data filed at 01/05/17 2841  Gross per 24 hour  Intake             1113 ml  Output              600 ml  Net              513 ml    GENERAL:alert, no distress and comfortable SKIN: skin color is improved, texture, turgor are normal, no rashes or significant lesions EYES: normal, Conjunctiva are pink and non-injected, sclera clear OROPHARYNX: Dry mucous membranes with poor dentition NECK: supple, thyroid normal size, non-tender, without nodularity LYMPH:  no palpable lymphadenopathy in the cervical, axillary or inguinal LUNGS: clear to auscultation and percussion with normal breathing effort HEART: regular rate & rhythm and no murmurs  ABDOMEN:abdomen soft, profound distention from ascites Musculoskeletal:no cyanosis of digits and no clubbing  NEURO: alert & oriented x 3 with fluent speech, no focal motor/sensory deficits   Labs:  Lab Results  Component Value Date   WBC 3.9 (L) 01/05/2017   HGB 9.1 (L) 01/05/2017   HCT 27.4 (L) 01/05/2017   MCV 93.2 01/05/2017   PLT 73 (L) 01/05/2017   NEUTROABS PENDING 01/05/2017    Lab Results  Component Value Date   NA 130 (L) 01/05/2017   K 4.7 01/05/2017   CL 102 01/05/2017   CO2 21 (L) 01/05/2017    Studies:  Ir Paracentesis  Result Date: 01/03/2017 INDICATION: Patient with recurrent ascites. Request is made for therapeutic paracentesis of up to 1.5 liters. EXAM: ULTRASOUND GUIDED THERAPEUTIC PARACENTESIS MEDICATIONS: 10 mL 1% lidocaine COMPLICATIONS: None  immediate. PROCEDURE: Informed written consent was obtained from the patient after a discussion of the risks, benefits and alternatives to treatment. A timeout was performed prior to the initiation of the procedure. Initial ultrasound scanning demonstrates a large amount of ascites within the right lateral abdomen. The right lateral abdomen was prepped and draped in the usual sterile fashion. 1% lidocaine was used for local anesthesia. Following this, a 19 gauge, 7-cm, Yueh catheter was introduced. An ultrasound image was saved for documentation purposes. The paracentesis was performed. The catheter was removed and a dressing was applied. The patient tolerated the procedure well without immediate post procedural complication. FINDINGS: A total of approximately 1.5 liters of clear, yellow fluid was removed. Samples were sent to the laboratory as requested by the clinical team. IMPRESSION: Successful ultrasound-guided paracentesis yielding 1.5 liters of peritoneal fluid. Read by:  Loyce Dys PA-C Electronically Signed   By: Jolaine Click M.D.   On: 01/03/2017 16:14    Assessment & Plan:   Anemia of chronic illness and secondary to chronic blood loss and chronic kidney disease He has multifactorial anemia.  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  I recommend keeping his INR less than 2 and fibrinogen greater than 150 If his INR trends up again greater than 2 tomorrow, please transfuse 2 units of fresh frozen plasma  Recurrent ascites secondary to portal hypertension and decompensated liver failure, end stage liver disease The patient had evidence of third spacing and progressive liver failure His hepatic function is poor Based on his last known liver function tests, the patient is at Child Pugh score C Based on most recent available numbers, his calculated MELD score is 29 His overall survival is poor Appreciate GI consult.  The patient is not a transplant candidate  Acute on chronic renal failure, hepatorenal syndrome The patient have clinical chronic kidney disease stage IV The patient has intravascular volume depletion likely secondary to third spacing and poor oral intake Rather than IV fluid resuscitation, I think he would benefit greatly with intravascular volume expansion with transfusion of blood products Appreciate nephrology's consult Uric acid level is very high.  I have ordered 1 dose of rasburicase  History of osteomyelitis, poor wound healing Continue wound dressing for now  Severe protein calorie malnutrition and severe cachexia Recommend full dietitian consult and consideration for appetite stimulant  Goals of care discussion The patient desiresfull code and everything done I felt that this is unrealistic; He hasmultiorgan failure with renal failure, liver failure, bone marrow failure and severe protein calorie malnutrition. He has multiple, recurrent hospitalization because of osteomyelitis and other medical complications Appreciate palliative care consult to address goals of care  Discharge  planning No plan for discharge right now due to unresolved medical issues I will return to check on him on Monday.  Please call hematology service on call if questions arise  Artis Delay, MD 01/05/2017  7:59 AM

## 2017-01-05 NOTE — Progress Notes (Signed)
GI doesn't plan to see patient over the weekend. Right now fluid management / renal function / anemia are most pressing issues. Nephrology managing renal failure. Anemia likely from stump bleeding in setting of coagulopathy and Hematology has given FFP and blood. Please call over weekend if questions.

## 2017-01-05 NOTE — Progress Notes (Signed)
PROGRESS NOTE    Jeffery [name]  ZOX:[id number] DOB: 11/18/1950 DOA: 01/03/2017 PCP: Julian Hy, MD    Brief Narrative:  Patient is a 52 show gentleman history of diabetes, end-stage liver disease/cirrhosis, anemia, thrombocytopenia, status post recent left BKA secondary to osteomyelitis who was admitted from the inpatient rehabilitation when his stump began to go some blood and patient noted to be anemic and thrombocytopenic. Patient was seen by hematology/oncology were recommended that patient be admitted to the acute hospital for transfusion of FFP, packed red blood cells, management of decompensated cirrhosis and ongoing management.  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 Patient status post 2 units packed red blood cell transfusion 01/04/2017 with hemoglobin currently at 9.1 from 7.2. Goal hemoglobin greater than 8 per hematology. Hematology/oncology following.  #2 chronic thrombocytopenia Secondary to cirrhosis, splenomegaly per hematology oncology. Patient was transfused 2 units of FFP on 01/03/2017. Follow.  #3 recurrent ascites secondary to portal hypertension and decompensated liver failure/end-stage liver disease Patient noted to have third spacing due to progressive liver disease. Patient noted  to have a MELD score of 29. Patient status post ultrasound guided paracenteses from right lateral abdomen with 1.5 L maximum removed of clear yellow fluid per interventional radiology on 01/03/2017 prior to transfer from inpatient rehabilitation. Patient nephrology patient deemed not a transplant candidate. Continue PPI, Xifaxan. Consult with GI for further evaluation and recommendations.  #4 coagulopathy Secondary to end-stage liver disease/cirrhosis. Patient status post 2 units FFP 01/03/2017. Due to significant synthetic liver dysfunction, it is felt per hematology the patient likely will not benefit from vitamin K. Goal INR per hematology INR close to 1.5 and below. Patient noted to have a very low fibrinogen level and a such 2 pillows of cryoprecipitate has been ordered per hematology. Per hematology goal INR less than 2 and fibrinogen greater than 150. If not greater than 2 hematology recommended transfusion of 2 units of FFP. Per hematology/oncology.  #5 acute on chronic kidney disease stage III Likely secondary to prerenal azotemia in the setting of anemia and hemodynamic instability. Also concern for hepatorenal syndrome. Patient with worsening renal function. Patient currently on midodrine. Patient has been started back octreotide per nephrology. Patient to be transfused 2 units packed red blood cells today. Concern for hepatorenal syndrome. Nephrology following.  #6 peripheral vascular disease status post left BKA 12/23/2016 per Dr. Ophelia Charter   stump site noted to be bleeding prior to admission. Patient status post 2 units FFP. Patient s/p 2 units packed red blood cells. Spoke with Dr. Ophelia Charter and patient to receive Betadine swab to incision at time of dressing change and dry dressing changes daily with Ace wrap.  #7 diabetes mellitus CBGs have ranged from 107 -168. Hemoglobin A1c was 5.8 on 10/24/2016. Sliding scale insulin.  #8 severe protein calorie malnutrition/ severe cachexia Dietitian  following.  #9 Hyperuricemia Patient has been ordered a dose of rasburicase per hematology/oncology.   #10 prognosis Patient with a poor prognosis. Patient with cirrhosis of the liver with decompensation requiring paracenteses, anemic and thrombocytopenic with a coagulopathy status post 2 units FFP and being transfused 2 units packed red blood cells. Patient debilitated. Patient status post recent left BKA with recent labs in no blood after some prophylactic heparin. Patient with a meld score of 29. Patient also with severe protein calorie malnutrition. Patient deemed not a transplant candidate per GI and nephrology. Palliative care following.     DVT prophylaxis: Patient with right AKA, recent left BKA, anemic and thrombocytopenic. Code Status: Full Family Communication: Updated patient. No family at bedside. Disposition Plan: Pending hospitalization and pending palliative care consultation and other consultants.   Consultants:   Hematology/oncology Dr.Gorsuch   Gastroenterology Dr.Danis III 01/04/2017  Palliative care Dr. Linna Darner 01/04/2017  Nephrology Dr. Signe Colt  Procedures:   2 units FFP 01/03/2017  2 units packed red blood cells pending 01/04/2017  Transfusion of cryoprecipitate 01/05/2017  Antimicrobials:   None   Subjective: Patient sleeping and easily arousable. Patient denies any chest pain. No shortness of breath. No significant change with abdominal pain. Patient states when he eats he feels like the food is coming right back up. Patient denies any bleeding. Patient states he's doing as best as he can.  Objective: Vitals:   01/05/17 0659 01/05/17 1030 01/05/17 1045 01/05/17 1100  BP: 111/63 124/71 132/70 128/67  Pulse: 81 83 85 94  Resp: Temp: 97.7 F (36.5 C) 98.2 F (36.8 C) 97.2 F (36.2 C) 98.3 F (36.8 C)  TempSrc: Axillary Oral    SpO2: 97% 100% 100% 100%  Weight: 110 kg (242 lb 8.1 oz)       Intake/Output Summary (Last 24 hours)  at 01/05/17 1401 Last data filed at 01/05/17 1133  Gross per 24 hour  Intake             1011 ml  Output              850 ml  Net              161 ml   Filed Weights   01/05/17 0659  Weight: 110 kg (242 lb 8.1 oz)    Examination:  General exam: Appears calm and comfortable, Pallor.  Respiratory system: Clear to auscultation. Decreased breath sounds in the bases. Respiratory effort normal. Cardiovascular system: S1 & S2 heard, RRR. No JVD, murmurs, rubs, gallops or clicks. No pedal edema. Gastrointestinal system: Abdomen is distended, soft and some tenderness to palpation in the right upper quadrant. Hepatomegaly. Normal bowel sounds heard. Central nervous system: Alert and oriented. No focal neurological deficits. Extremities: left BKA with stump dressing. Right AKA  Skin: No rashes, lesions or ulcers Psychiatry: Judgement and insight appear normal. Mood & affect appropriate.     Data Reviewed: I have personally reviewed following labs and imaging studies  CBC:  Recent Labs Lab 12/31/16 0503 01/02/17 0636 01/04/17 0437 01/05/17 0501  WBC 3.9* 5.6 4.4 3.9*  NEUTROABS  --   --   --  2.3  HGB 7.2* 8.0* 7.2* 9.1*  HCT 21.4* 23.1* 21.6* 27.4*  MCV 96.4 95.5 97.3 93.2  PLT 54* 67* 67* 73*   Basic Metabolic Panel:  Recent Labs Lab 12/31/16 0503 01/01/17 0256 01/02/17 0636 01/04/17 0437 01/05/17 0501  NA 132* 132* 132* 130* 130*  K 4.6 4.4  4.6 4.2 4.7  CL 103 104 104 101 102  CO2 23 22 21* 21* 21*  GLUCOSE 137* 117* 91 94 125*  BUN 56* 54* 57* 57* 57*  CREATININE 3.23* 3.35* 3.33* 3.50* 3.35*  CALCIUM 8.3* 8.1* 8.2* 8.5* 8.3*  PHOS 3.7  --   --   --   --    GFR: Estimated Creatinine Clearance: 28.2 mL/min (A) (by C-G formula based on SCr of 3.35 mg/dL (H)). Liver Function Tests:  Recent Labs Lab 12/31/16 0503 01/04/17 0729 01/05/17 0501  AST  --  37 33  ALT  --  13* 11*  ALKPHOS  --  70 72  BILITOT  --  2.9* 4.0*  PROT  --  7.3 7.4  ALBUMIN 1.8* 1.9*  1.8*   No results for input(s): LIPASE, AMYLASE in the last 168 hours. No results for input(s): AMMONIA in the last 168 hours. Coagulation Profile:  Recent Labs Lab 01/01/17 0735 01/02/17 0636 01/03/17 0628 01/05/17 0501  INR 2.21 2.25 2.20 1.93   Cardiac Enzymes: No results for input(s): CKTOTAL, CKMB, CKMBINDEX, TROPONINI in the last 168 hours. BNP (last 3 results) No results for input(s): PROBNP in the last 8760 hours. HbA1C: No results for input(s): HGBA1C in the last 72 hours. CBG:  Recent Labs Lab 01/04/17 1143 01/04/17 1644 01/04/17 2218 01/05/17 0810 01/05/17 1138  GLUCAP 96 122* 108* 107* 168*   Lipid Profile: No results for input(s): CHOL, HDL, LDLCALC, TRIG, CHOLHDL, LDLDIRECT in the last 72 hours. Thyroid Function Tests: No results for input(s): TSH, T4TOTAL, FREET4, T3FREE, THYROIDAB in the last 72 hours. Anemia Panel: No results for input(s): VITAMINB12, FOLATE, FERRITIN, TIBC, IRON, RETICCTPCT in the last 72 hours. Sepsis Labs: No results for input(s): PROCALCITON, LATICACIDVEN in the last 168 hours.  Recent Results (from the past 240 hour(s))  Fungus Culture With Stain     Status: None (Preliminary result)   Collection Time: 12/29/16  1:26 PM  Result Value Ref Range Status   Fungus Stain Final report  Final    Comment: (NOTE) Performed At: [facility] [address] [place], [place] [id number] Mila Homer MD HK:[id number]    Fungus (Mycology) Culture PENDING  Incomplete   Fungal Source FLUID  Final  Culture, body fluid-bottle     Status: None   Collection Time: 12/29/16  1:26 PM  Result Value Ref Range Status   Specimen Description FLUID  Final   Special Requests PERITONEAL  Final   Culture NO GROWTH 5 DAYS  Final   Report Status 01/03/2017 FINAL  Final  Gram stain     Status: None   Collection Time: 12/29/16  1:26 PM  Result Value Ref Range Status   Specimen Description FLUID  Final   Special Requests PERITONEAL  Final    Gram Stain NO WBC SEEN NO ORGANISMS SEEN   Final   Report Status 12/29/2016 FINAL  Final  Fungus Culture Result     Status: None   Collection Time: 12/29/16  1:26 PM  Result Value Ref Range Status   Result 1 Comment  Final    Comment: (NOTE) KOH/Calcofluor preparation:  no fungus observed. Performed At: [facility] [address] [place], [place] [id number] Mila Homer MD OA:[id number]   Culture, Urine     Status: None   Collection Time: 12/31/16  2:41 PM  Result Value Ref Range Status   Specimen Description URINE, CATHETERIZED  Final   Special Requests NONE  Final   Culture  NO GROWTH  Final   Report Status 01/01/2017 FINAL  Final         Radiology Studies: Ir Paracentesis  Result Date: 01/03/2017 INDICATION: Patient with recurrent ascites. Request is made for therapeutic paracentesis of up to 1.5 liters. EXAM: ULTRASOUND GUIDED THERAPEUTIC PARACENTESIS MEDICATIONS: 10 mL 1% lidocaine COMPLICATIONS: None immediate. PROCEDURE: Informed written consent was obtained from the patient after a discussion of the risks, benefits and alternatives to treatment. A timeout was performed prior to the initiation of the procedure. Initial ultrasound scanning demonstrates a large amount of ascites within the right lateral abdomen. The right lateral abdomen was prepped and draped in the usual sterile fashion. 1% lidocaine was used for local anesthesia. Following this, a 19 gauge, 7-cm, Yueh catheter was introduced. An ultrasound image was saved for documentation purposes. The paracentesis was performed. The catheter was removed and a dressing was applied. The patient tolerated the procedure well without immediate post procedural complication. FINDINGS: A total of approximately 1.5 liters of clear, yellow fluid was removed. Samples were sent to the laboratory as requested by the clinical team. IMPRESSION: Successful ultrasound-guided paracentesis yielding 1.5 liters of peritoneal fluid.  Read by:  Loyce Dys PA-C Electronically Signed   By: Jolaine Click M.D.   On: 01/03/2017 16:14        Scheduled Meds: . feeding supplement (ENSURE ENLIVE)  237 mL Oral TID BM  . feeding supplement (PRO-STAT SUGAR FREE 64)  30 mL Oral Q1500  . insulin aspart  0-5 Units Subcutaneous QHS  . insulin aspart  0-9 Units Subcutaneous TID WC  . mouth rinse  15 mL Mouth Rinse BID  . midodrine  15 mg Oral TID WC  . multivitamin with minerals  1 tablet Oral Daily  . octreotide  100 mcg Subcutaneous Q8H  . pantoprazole  40 mg Oral BID  . rifaximin  550 mg Oral BID  . saccharomyces boulardii  250 mg Oral BID  . sodium chloride flush  3 mL Intravenous Q12H   Continuous Infusions: . sodium chloride    . sodium chloride    . rasburicase (ELITEK) IV infusion 3 mg (01/05/17 1355)     LOS: 2 days    Time spent: 40 mins    Yuritzi Kamp, MD [facility] Pager [phone]  If 7PM-7AM, please contact night-coverage [email or web address] Password [email or web address] 01/05/2017, 2:01 PM

## 2017-01-05 NOTE — Progress Notes (Signed)
Rehab admissions - patient known to me from previous inpatient rehab stay 12/27/16 to 01/03/17.  Patient had a bad experience with SNF in the past and does not want to go to a SNF.  He prefers to return to inpatient rehab once he is medically ready.  Our team feels that patient will need 24/7 assist after rehab.  He has [employer] so we would need their approval if he readmits to inpatient rehab.  I will have my partner follow up next week.  #[phone]

## 2017-01-05 NOTE — Evaluation (Signed)
Physical Therapy Evaluation Patient Details Name: Shiva Sahagian MRN: [id number] DOB: 09/30/1950 Today's Date: 01/05/2017   History of Present Illness  Pt is a 66 yo male admitted from CIR on 01/03/17 with thrombocytopenia and coagulopathy. PMH significant for cirrhosis, osteomyelitis, DM2, depression, encephalopathy, R AKA and L BKA.   Clinical Impression  Pt presents with the above diagnosis and below deficits. Prior to admission, pt was receiving rehab at [facility] and was progressively becoming weaker. Pt's ultimate goal is to return to CIR at discharge in order to complete his rehab. Pt requires Min A for bed mobility this session and deferred any EOB or OOB mobility this session due to increased fatigue. Pt will benefit from continued acute rehab services in order to address the below deficits prior to discharge to venue recommended below.     Follow Up Recommendations CIR;Supervision/Assistance - 24 hour    Equipment Recommendations  None recommended by PT    Recommendations for Other Services Rehab consult;OT consult     Precautions / Restrictions Precautions Precautions: Fall Restrictions Weight Bearing Restrictions: Yes RLE Weight Bearing: Non weight bearing LLE Weight Bearing: Non weight bearing      Mobility  Bed Mobility Overal bed mobility: Needs Assistance Bed Mobility: Rolling Rolling: Min assist         General bed mobility comments: Min a to roll to side and to roll back.   Transfers                 General transfer comment: refused OOB mobility this session  Ambulation/Gait                Stairs            Wheelchair Mobility    Modified Rankin (Stroke Patients Only)       Balance                                             Pertinent Vitals/Pain Pain Assessment: Faces Faces Pain Scale: Hurts a little bit Pain Location: L Leg Pain Descriptors / Indicators: Sore    Home Living Family/patient expects to  be discharged to:: Inpatient rehab Living Arrangements: Alone Available Help at Discharge: Friend(s) Type of Home: House Home Access: Ramped entrance     Home Layout: One level Home Equipment: Shower seat - built in;Wheelchair - manual Additional Comments: 3 grab bars by toilet, 2 in the shower and 2 by the bed, shower has been renovated; has used a Film/video editor to recliner and for car transfers    Prior Function Level of Independence: Needs assistance   Gait / Transfers Assistance Needed: pt has been becoming progressively weaker. Was transfering via lift prior to admission  ADL's / Homemaking Assistance Needed: dependent        Hand Dominance   Dominant Hand: Right    Extremity/Trunk Assessment   Upper Extremity Assessment Upper Extremity Assessment: Generalized weakness    Lower Extremity Assessment Lower Extremity Assessment: Generalized weakness       Communication   Communication: No difficulties  Cognition Arousal/Alertness: Awake/alert Behavior During Therapy: WFL for tasks assessed/performed Overall Cognitive Status: Within Functional Limits for tasks assessed  General Comments      Exercises Amputee Exercises Quad Sets: Left;10 reps;Supine Gluteal Sets: AROM;Left;10 reps;Supine Hip ABduction/ADduction: AROM;Left;10 reps;Supine   Assessment/Plan    PT Assessment Patient needs continued PT services  PT Problem List Decreased strength;Decreased range of motion;Decreased activity tolerance;Decreased balance;Decreased mobility;Decreased coordination;Decreased cognition;Decreased knowledge of use of DME;Decreased safety awareness;Decreased knowledge of precautions;Pain       PT Treatment Interventions DME instruction;Functional mobility training;Therapeutic activities;Therapeutic exercise;Balance training;Neuromuscular re-education;Cognitive remediation;Patient/family education    PT Goals (Current  goals can be found in the Care Plan section)  Acute Rehab PT Goals Patient Stated Goal: to get back to work PT Goal Formulation: With patient Time For Goal Achievement: 01/03/17 Potential to Achieve Goals: Fair    Frequency Min 3X/week   Barriers to discharge Decreased caregiver support      Co-evaluation               End of Session   Activity Tolerance: Patient limited by fatigue Patient left: in bed;with call bell/phone within reach Nurse Communication: Mobility status PT Visit Diagnosis: Pain;Muscle weakness (generalized) (M62.81) Pain - Right/Left: Left Pain - part of body: Leg    Time: 3086-5784 PT Time Calculation (min) (ACUTE ONLY): 9 min   Charges:   PT Evaluation $PT Eval Moderate Complexity: 1 Procedure     PT G Codes:        Colin Broach PT, DPT  [phone]   Ruel Favors Aletha Halim 01/05/2017, 1:23 PM

## 2017-01-05 NOTE — Progress Notes (Signed)
[facility] Progress Note    Assessment/ Plan:   1 Acute kidney injury superimposed on CKD G3: Initial exacerbation likely due to blood loss anemia and unstable hemodynamics.  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  He is on midodrine and he's getting colloid replacement with blood products.  Continue octreotide.  Not a transplant candidate which was corroborated by GI yesterday.  Continue palliative care discussions.  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  h/o HE on lactulose.  Had a 1.5L paracentesis 4/18.  I don't recommend LVPs.  Can't put back on aldactone/Lasix due to renal function.  Per pt, he was never a drinker, ? NAFLD.  Hasn't had a liver biopsy.  GI consulted, cryptogenic cirrhosis Transplant discussion as above.   3.  Anemia/ thrombocytopenia/ coagulopathy: all due to ESLD.  Heme following, transfusing as appropriate.   Fibrinogen low as expected in liver disease.  Continue supportive care  4 DM- per primary  5 PVD post new L BKA- hopefully correction of coagulopathy will help oozing from stump  6.  Dispo: now in acute care, discharge TBD.   Subjective:    Pt feeling stronger from transfusions.  Feels his belly is a little more distended.      Objective:   BP 128/67   Pulse 94   Temp 98.3 F (36.8 C)   Resp 16   Wt 110 kg (242 lb 8.1 oz)   SpO2 100%   BMI 31.99 kg/m   Intake/Output Summary (Last 24 hours) at 01/05/17 1226 Last data filed at 01/05/17 1133  Gross per 24 hour  Intake             1233 ml  Output              850 ml  Net              383 ml   Weight change:   Physical Exam: General appearance: alert and cooperative, pale HEENT: EOMI, no JVD GI: soft, non-tender; bowel sounds normal; no masses,  no organomegaly and large ascites  No  asterixis Bilat amp;R AKA, L BKA wrapped   Imaging: Ir Paracentesis  Result Date: 01/03/2017 INDICATION: Patient with recurrent ascites. Request is made for therapeutic paracentesis of up to 1.5 liters. EXAM: ULTRASOUND GUIDED THERAPEUTIC PARACENTESIS MEDICATIONS: 10 mL 1% lidocaine COMPLICATIONS: None immediate. PROCEDURE: Informed written consent was obtained from the patient after a discussion of the risks, benefits and alternatives to treatment. A timeout was performed prior to the initiation of the procedure. Initial ultrasound scanning demonstrates a large amount of ascites within the right lateral abdomen. The right lateral abdomen was prepped and draped in the usual sterile fashion. 1% lidocaine was used for local anesthesia. Following this, a 19 gauge, 7-cm, Yueh catheter was introduced. An ultrasound image was saved for documentation purposes. The paracentesis was performed. The catheter was removed and a dressing was applied. The patient tolerated the procedure well without immediate post procedural complication. FINDINGS: A total of approximately 1.5 liters of clear, yellow fluid was removed. Samples were sent to the laboratory as requested by the clinical team. IMPRESSION: Successful ultrasound-guided paracentesis yielding 1.5 liters of peritoneal fluid. Read by:  Loyce Dys PA-C Electronically Signed   By: Jolaine Click M.D.   On: 01/03/2017 16:14    Labs: [employer]  Recent Labs Lab 12/31/16 0503 01/01/17 0256 01/02/17 0636 01/04/17 0437 01/05/17 0501  NA 132* 132* 132* 130* 130*  K 4.6 4.4 4.6 4.2 4.7  CL 103 104 104 101 102  CO2 23 22 21* 21* 21*  GLUCOSE 137* 117* 91 94 125*  BUN 56* 54* 57* 57* 57*  CREATININE 3.23* 3.35* 3.33* 3.50* 3.35*  CALCIUM 8.3* 8.1* 8.2* 8.5* 8.3*  PHOS 3.7  --   --   --   --    CBC  Recent Labs Lab 12/31/16 0503 01/02/17 0636 01/04/17 0437 01/05/17 0501  WBC 3.9* 5.6 4.4 3.9*  NEUTROABS  --   --   --  2.3  HGB 7.2* 8.0* 7.2* 9.1*   HCT 21.4* 23.1* 21.6* 27.4*  MCV 96.4 95.5 97.3 93.2  PLT 54* 67* 67* 73*    Medications:    . feeding supplement (ENSURE ENLIVE)  237 mL Oral TID BM  . feeding supplement (PRO-STAT SUGAR FREE 64)  30 mL Oral Q1500  . insulin aspart  0-5 Units Subcutaneous QHS  . insulin aspart  0-9 Units Subcutaneous TID WC  . mouth rinse  15 mL Mouth Rinse BID  . midodrine  15 mg Oral TID WC  . multivitamin with minerals  1 tablet Oral Daily  . octreotide  100 mcg Subcutaneous Q8H  . pantoprazole  40 mg Oral BID  . rifaximin  550 mg Oral BID  . saccharomyces boulardii  250 mg Oral BID  . sodium chloride flush  3 mL Intravenous Q12H      Bufford Buttner MD 01/05/2017, 12:26 PM

## 2017-01-06 ENCOUNTER — Inpatient Hospital Stay (HOSPITAL_COMMUNITY): Payer: BC Managed Care – PPO | Admitting: Occupational Therapy

## 2017-01-06 LAB — CBC WITH DIFFERENTIAL/PLATELET
BASOS ABS: 0 10*3/uL (ref 0.0–0.1)
Basophils Relative: 1 %
Eosinophils Absolute: 0.4 10*3/uL (ref 0.0–0.7)
Eosinophils Relative: 10 %
HEMATOCRIT: 29.9 % — AB (ref 39.0–52.0)
Hemoglobin: 9.9 g/dL — ABNORMAL LOW (ref 13.0–17.0)
Lymphocytes Relative: 20 %
Lymphs Abs: 0.7 10*3/uL (ref 0.7–4.0)
MCH: 31.3 pg (ref 26.0–34.0)
MCHC: 33.1 g/dL (ref 30.0–36.0)
MCV: 94.6 fL (ref 78.0–100.0)
MONO ABS: 0.6 10*3/uL (ref 0.1–1.0)
MONOS PCT: 16 %
NEUTROS PCT: 53 %
Neutro Abs: 1.8 10*3/uL (ref 1.7–7.7)
PLATELETS: 84 10*3/uL — AB (ref 150–400)
RBC: 3.16 MIL/uL — AB (ref 4.22–5.81)
RDW: 21.8 % — ABNORMAL HIGH (ref 11.5–15.5)
WBC: 3.5 10*3/uL — AB (ref 4.0–10.5)

## 2017-01-06 LAB — PREPARE CRYOPRECIPITATE
Unit division: 0
Unit division: 0

## 2017-01-06 LAB — COMPREHENSIVE METABOLIC PANEL
ALBUMIN: 2 g/dL — AB (ref 3.5–5.0)
ALT: 10 U/L — ABNORMAL LOW (ref 17–63)
AST: 32 U/L (ref 15–41)
Alkaline Phosphatase: 66 U/L (ref 38–126)
Anion gap: 8 (ref 5–15)
BUN: 57 mg/dL — AB (ref 6–20)
CHLORIDE: 101 mmol/L (ref 101–111)
CO2: 20 mmol/L — ABNORMAL LOW (ref 22–32)
Calcium: 8.4 mg/dL — ABNORMAL LOW (ref 8.9–10.3)
Creatinine, Ser: 3.35 mg/dL — ABNORMAL HIGH (ref 0.61–1.24)
GFR calc Af Amer: 21 mL/min — ABNORMAL LOW (ref >60)
GFR calc non Af Amer: 18 mL/min — ABNORMAL LOW (ref >60)
GLUCOSE: 113 mg/dL — AB (ref 65–99)
POTASSIUM: 4.6 mmol/L (ref 3.5–5.1)
Sodium: 129 mmol/L — ABNORMAL LOW (ref 135–145)
Total Bilirubin: 3 mg/dL — ABNORMAL HIGH (ref 0.3–1.2)
Total Protein: 7.3 g/dL (ref 6.5–8.1)

## 2017-01-06 LAB — GLUCOSE, CAPILLARY
Glucose-Capillary: 105 mg/dL — ABNORMAL HIGH (ref 65–99)
Glucose-Capillary: 145 mg/dL — ABNORMAL HIGH (ref 65–99)
Glucose-Capillary: 140 mg/dL — ABNORMAL HIGH (ref 65–99)
Glucose-Capillary: 132 mg/dL — ABNORMAL HIGH (ref 65–99)

## 2017-01-06 LAB — PROTIME-INR
INR: 1.59
Prothrombin Time: 19.1 seconds — ABNORMAL HIGH (ref 11.4–15.2)

## 2017-01-06 LAB — BPAM CRYOPRECIPITATE
Blood Product Expiration Date: 201804201502
Blood Product Expiration Date: 201804202314
ISSUE DATE / TIME: 201804201000
ISSUE DATE / TIME: 201804201746
UNIT TYPE AND RH: 6200
Unit Type and Rh: 6200

## 2017-01-06 NOTE — Progress Notes (Signed)
[facility] Progress Note    Assessment/ Plan:   1 Acute kidney injury superimposed on CKD G3: Initial exacerbation likely due to blood loss anemia and unstable hemodynamics.  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  He is on midodrine and he's getting colloid replacement with blood products.  Continue octreotide, and creatinine appears to be stabilizing at least for now.  Not a transplant candidate which was corroborated by GI yesterday.  Continue palliative care discussions.  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  h/o HE on lactulose.  Had a 1.5L paracentesis 4/18.  I don't recommend LVPs.  Can't put back on aldactone/Lasix due to renal function.  Per pt, he was never a drinker, ? NAFLD.  Hasn't had a liver biopsy.  GI consulted, cryptogenic cirrhosis Transplant discussion as above.   3.  Anemia/ thrombocytopenia/ coagulopathy: all due to ESLD.  Heme following, transfusing as appropriate.   Fibrinogen low as expected in liver disease.  Continue supportive care  4 DM- per primary  5 PVD post new L BKA- hopefully correction of coagulopathy will help oozing from stump  6.  Hyponatremia: hypervolemic hyponatremia, this is a poor prognostic sign.    7.  Dispo: now in acute care, discharge TBD.   Subjective:    Sleeping this AM and looks exhausted.    Objective:   BP (!) 144/65   Pulse 85   Temp 98.1 F (36.7 C)   Resp 17   Wt 112.1 kg (247 lb 3.2 oz)   SpO2 99%   BMI 32.61 kg/m   Intake/Output Summary (Last 24 hours) at 01/06/17 0938 Last data filed at 01/06/17 0600  Gross per 24 hour  Intake              723 ml  Output              550 ml  Net              173 ml   Weight change: 2.129 kg (4 lb 11.1 oz)  Physical Exam: General appearance: pale, sleeping HEENT: EOMI, no  JVD GI: soft, non-tender; bowel sounds normal; no masses,  no organomegaly and large ascites   Bilat amp;R AKA, L BKA wrapped   Imaging: No results found.  Labs: BMET  Recent Labs Lab 12/31/16 0503 01/01/17 0256 01/02/17 0636 01/04/17 0437 01/05/17 0501 01/06/17 0652  NA 132* 132* 132* 130* 130* 129*  K 4.6 4.4 4.6 4.2 4.7 4.6  CL 103 104 104 101 102 101  CO2 23 22 21* 21* 21* 20*  GLUCOSE 137* 117* 91 94 125* 113*  BUN 56* 54* 57* 57* 57* 57*  CREATININE 3.23* 3.35* 3.33* 3.50* 3.35* 3.35*  CALCIUM 8.3* 8.1* 8.2* 8.5* 8.3* 8.4*  PHOS 3.7  --   --   --   --   --    CBC  Recent Labs Lab 01/02/17 0636 01/04/17 0437 01/05/17 0501 01/06/17 0652  WBC 5.6 4.4 3.9* 3.5*  NEUTROABS  --   --  2.3 PENDING  HGB 8.0* 7.2* 9.1* 9.9*  HCT 23.1* 21.6* 27.4* 29.9*  MCV 95.5 97.3 93.2 94.6  PLT 67* 67* 73* 84*    Medications:    . feeding supplement (ENSURE ENLIVE)  237 mL Oral TID BM  . feeding supplement (PRO-STAT SUGAR FREE  64)  30 mL Oral Q1500  . insulin aspart  0-5 Units Subcutaneous QHS  . insulin aspart  0-9 Units Subcutaneous TID WC  . mouth rinse  15 mL Mouth Rinse BID  . midodrine  15 mg Oral TID WC  . multivitamin with minerals  1 tablet Oral Daily  . octreotide  100 mcg Subcutaneous Q8H  . pantoprazole  40 mg Oral BID  . rifaximin  550 mg Oral BID  . saccharomyces boulardii  250 mg Oral BID  . sodium chloride flush  3 mL Intravenous Q12H      Bufford Buttner MD 01/06/2017, 9:38 AM

## 2017-01-06 NOTE — Progress Notes (Signed)
PROGRESS NOTE    Jeffery [name]  ZOX:[id number] DOB: 1950-11-04 DOA: 01/03/2017 PCP: Julian Hy, MD    Brief Narrative:  Patient is a 57 show gentleman history of diabetes, end-stage liver disease/cirrhosis, anemia, thrombocytopenia, status post recent left BKA secondary to osteomyelitis who was admitted from the inpatient rehabilitation when his stump began to go some blood and patient noted to be anemic and thrombocytopenic. Patient was seen by hematology/oncology were recommended that patient be admitted to the acute hospital for transfusion of FFP, packed red blood cells, management of decompensated cirrhosis and ongoing management.  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 Patient status post 2 units packed red blood cell transfusion 01/04/2017 with hemoglobin currently at 9.9 from 7.2. Goal hemoglobin greater than 8 per hematology. Hematology/oncology following.  #2 chronic thrombocytopenia Secondary to cirrhosis, splenomegaly per hematology oncology. Patient was transfused 2 units of FFP on 01/03/2017. Follow.  #3 recurrent ascites secondary to portal hypertension and decompensated liver failure/end-stage liver disease Patient noted to have third spacing due to progressive liver disease.  Patient noted to have a MELD score of 29. Patient status post ultrasound guided paracenteses from right lateral abdomen with 1.5 L maximum removed of clear yellow fluid per interventional radiology on 01/03/2017 prior to transfer from inpatient rehabilitation. Patient nephrology and GI, patient deemed not a transplant candidate. Continue PPI, Xifaxan.   #4 coagulopathy Secondary to end-stage liver disease/cirrhosis. Patient status post 2 units FFP 01/03/2017. Due to significant synthetic liver dysfunction, it is felt per hematology the patient likely will not benefit from vitamin K. Goal INR per hematology INR close to 1.5 and below. Patient noted to have a very low fibrinogen level and a such 2 pillows of cryoprecipitate has been ordered per hematology. Per hematology goal INR less than 2 and fibrinogen greater than 150. If INR greater than 2 hematology recommended transfusion of 2 units of FFP. Per hematology/oncology.  #5 acute on chronic kidney disease stage III Likely secondary to prerenal azotemia in the setting of anemia and hemodynamic instability. Also concern for hepatorenal syndrome. Patient with worsening renal function initially which seemed to be plateauing. Patient currently on midodrine. Patient has been started back octreotide per nephrology. Patient s/p transfusion of 2 units packed red blood cells. Concern for hepatorenal syndrome. Nephrology following.  #6 peripheral vascular disease status post left BKA 12/23/2016 per Dr. Ophelia Charter   stump site noted to be bleeding prior to admission. Patient status post 2 units FFP. Patient s/p 2 units packed red blood cells. Spoke with Dr. Ophelia Charter and patient dressing changes of: Betadine swab to incision at time of dressing change and dry dressing changes daily with Ace wrap.  #7 diabetes mellitus CBGs have ranged from 105 -140. Hemoglobin A1c was 5.8 on 10/24/2016. Sliding scale insulin.  #8 severe protein calorie malnutrition/  severe  cachexia Dietitian following.  #9 Hyperuricemia Patient has been ordered a dose of rasburicase per hematology/oncology.   #10 prognosis Patient with a poor prognosis. Patient with cirrhosis of the liver with decompensation requiring paracenteses, anemic and thrombocytopenic with a coagulopathy status post 2 units FFP and being transfused 2 units packed red blood cells. Patient debilitated. Patient status post recent left BKA with recent labs in no blood after some prophylactic heparin. Patient with a meld score of 29. Patient also with severe protein calorie malnutrition. Patient deemed not a transplant candidate per GI and nephrology. Palliative care following.     DVT prophylaxis: Patient with right AKA, recent left BKA, anemic and thrombocytopenic. Code Status: Full Family Communication: Updated patient. No family at bedside. Disposition Plan: Pending hospitalization and pending palliative care consultation and other consultants.   Consultants:   Hematology/oncology Dr.Gorsuch   Gastroenterology Dr.Danis III 01/04/2017  Palliative care Dr. Linna Darner 01/04/2017  Nephrology Dr. Signe Colt  Procedures:   2 units FFP 01/03/2017  2 units packed red blood cells pending 01/04/2017  Transfusion of cryoprecipitate 01/05/2017  Antimicrobials:   None   Subjective: Patient in bed alert awake following questions. Patient denies any chest pain. No shortness of breath. No significant change with abdominal pain. Patient states he's feeling better today.  Objective: Vitals:   01/05/17 2058 01/05/17 2215 01/06/17 0527 01/06/17 1000  BP: 134/67  (!) 144/65 127/67  Pulse: 84  85 80  Resp: Temp: 98.6 F (37 C)  98.1 F (36.7 C) 98.6 F (37 C)  TempSrc: Oral   Oral  SpO2: 100%  99% 98%  Weight:  112.1 kg (247 lb 3.2 oz)      Intake/Output Summary (Last 24 hours) at 01/06/17 1428 Last data filed at 01/06/17 1300  Gross per 24 hour  Intake              723 ml  Output               300 ml  Net              423 ml   Filed Weights   01/05/17 0659 01/05/17 2215  Weight: 110 kg (242 lb 8.1 oz) 112.1 kg (247 lb 3.2 oz)    Examination:  General exam: Appears calm and comfortable, Pallor.  Respiratory system: Clear to auscultation. Decreased breath sounds in the bases. Respiratory effort normal. Cardiovascular system: S1 & S2 heard, RRR. No JVD, murmurs, rubs, gallops or clicks. No pedal edema. Gastrointestinal system: Abdomen is distended, soft and some tenderness to palpation in the right upper quadrant. Hepatomegaly/Splenomegaly. Normal bowel sounds heard. Central nervous system: Alert and oriented. No focal neurological deficits. Extremities: left BKA with stump dressing. Right AKA  Skin: No rashes, lesions or ulcers Psychiatry: Judgement and insight appear normal. Mood & affect appropriate.     Data Reviewed: I have personally reviewed following labs and imaging studies  CBC:  Recent Labs Lab 12/31/16 0503 01/02/17 0636 01/04/17 0437 01/05/17 0501 01/06/17 0652  WBC 3.9* 5.6 4.4 3.9* 3.5*  NEUTROABS  --   --   --  2.3 1.8  HGB 7.2* 8.0* 7.2* 9.1* 9.9*  HCT 21.4* 23.1* 21.6* 27.4* 29.9*  MCV 96.4 95.5 97.3 93.2 94.6  PLT 54* 67* 67* 73* 84*   Basic Metabolic Panel:  Recent Labs Lab 12/31/16 0503 01/01/17 0256 01/02/17 0636 01/04/17 0437 01/05/17 0501 01/06/17 0652  NA 132* 132* 132* 130* 130* 129*  K  4.6 4.4 4.6 4.2 4.7 4.6  CL 103 104 104 101 102 101  CO2 23 22 21* 21* 21* 20*  GLUCOSE 137* 117* 91 94 125* 113*  BUN 56* 54* 57* 57* 57* 57*  CREATININE 3.23* 3.35* 3.33* 3.50* 3.35* 3.35*  CALCIUM 8.3* 8.1* 8.2* 8.5* 8.3* 8.4*  PHOS 3.7  --   --   --   --   --    GFR: Estimated Creatinine Clearance: 28.5 mL/min (A) (by C-G formula based on SCr of 3.35 mg/dL (H)). Liver Function Tests:  Recent Labs Lab 12/31/16 0503 01/04/17 0729 01/05/17 0501 01/06/17 0652  AST  --  37 33 32  ALT  --  13* 11* 10*  ALKPHOS  --  70 72 66   BILITOT  --  2.9* 4.0* 3.0*  PROT  --  7.3 7.4 7.3  ALBUMIN 1.8* 1.9* 1.8* 2.0*   No results for input(s): LIPASE, AMYLASE in the last 168 hours. No results for input(s): AMMONIA in the last 168 hours. Coagulation Profile:  Recent Labs Lab 01/01/17 0735 01/02/17 0636 01/03/17 0628 01/05/17 0501 01/06/17 0652  INR 2.21 2.25 2.20 1.93 1.59   Cardiac Enzymes: No results for input(s): CKTOTAL, CKMB, CKMBINDEX, TROPONINI in the last 168 hours. BNP (last 3 results) No results for input(s): PROBNP in the last 8760 hours. HbA1C: No results for input(s): HGBA1C in the last 72 hours. CBG:  Recent Labs Lab 01/05/17 0810 01/05/17 1138 01/05/17 1705 01/06/17 0741 01/06/17 1126  GLUCAP 107* 168* 151* 105* 145*   Lipid Profile: No results for input(s): CHOL, HDL, LDLCALC, TRIG, CHOLHDL, LDLDIRECT in the last 72 hours. Thyroid Function Tests: No results for input(s): TSH, T4TOTAL, FREET4, T3FREE, THYROIDAB in the last 72 hours. Anemia Panel: No results for input(s): VITAMINB12, FOLATE, FERRITIN, TIBC, IRON, RETICCTPCT in the last 72 hours. Sepsis Labs: No results for input(s): PROCALCITON, LATICACIDVEN in the last 168 hours.  Recent Results (from the past 240 hour(s))  Fungus Culture With Stain     Status: None (Preliminary result)   Collection Time: 12/29/16  1:26 PM  Result Value Ref Range Status   Fungus Stain Final report  Final    Comment: (NOTE) Performed At: [facility] [address] [place], [place] [id number] Mila Homer MD WU:[id number]    Fungus (Mycology) Culture PENDING  Incomplete   Fungal Source FLUID  Final  Culture, body fluid-bottle     Status: None   Collection Time: 12/29/16  1:26 PM  Result Value Ref Range Status   Specimen Description FLUID  Final   Special Requests PERITONEAL  Final   Culture NO GROWTH 5 DAYS  Final   Report Status 01/03/2017 FINAL  Final  Gram stain     Status: None   Collection Time: 12/29/16  1:26 PM  Result  Value Ref Range Status   Specimen Description FLUID  Final   Special Requests PERITONEAL  Final   Gram Stain NO WBC SEEN NO ORGANISMS SEEN   Final   Report Status 12/29/2016 FINAL  Final  Fungus Culture Result     Status: None   Collection Time: 12/29/16  1:26 PM  Result Value Ref Range Status   Result 1 Comment  Final    Comment: (NOTE) KOH/Calcofluor preparation:  no fungus observed. Performed At: [facility] [address] [place], [place] [id number] Mila Homer MD VH:[id number]   Culture, Urine     Status: None   Collection Time: 12/31/16  2:41 PM  Result Value Ref Range Status   Specimen Description URINE, CATHETERIZED  Final   Special Requests NONE  Final   Culture NO GROWTH  Final   Report Status 01/01/2017 FINAL  Final         Radiology Studies: No results found.      Scheduled Meds: . feeding supplement (ENSURE ENLIVE)  237 mL Oral TID BM  . feeding supplement (PRO-STAT SUGAR FREE 64)  30 mL Oral Q1500  . insulin aspart  0-5 Units Subcutaneous QHS  . insulin aspart  0-9 Units Subcutaneous TID WC  . mouth rinse  15 mL Mouth Rinse BID  . midodrine  15 mg Oral TID WC  . multivitamin with minerals  1 tablet Oral Daily  . octreotide  100 mcg Subcutaneous Q8H  . pantoprazole  40 mg Oral BID  . rifaximin  550 mg Oral BID  . saccharomyces boulardii  250 mg Oral BID  . sodium chloride flush  3 mL Intravenous Q12H   Continuous Infusions: . sodium chloride    . sodium chloride       LOS: 3 days    Time spent: 35 mins    Sapir Lavey, MD [facility] Pager [phone]  If 7PM-7AM, please contact night-coverage [email or web address] Password [email or web address] 01/06/2017, 2:28 PM

## 2017-01-06 NOTE — Progress Notes (Signed)
   01/06/17 1005  Clinical Encounter Type  Visited With Patient  Visit Type Follow-up  Spiritual Encounters  Spiritual Needs Emotional  Stress Factors  Patient Stress Factors None identified  Introduction to Pt. Pt declined visit for chaplaincy services.

## 2017-01-06 NOTE — Progress Notes (Signed)
Stopped by in response to consult for major life transitions, but pt was asleep. Spoke with nurse, who said tomorrow would be soon enough. Will pass on referral to colleague.   01/05/17 2100  Clinical Encounter Type  Visited With Patient not available;Health care provider  Visit Type Initial  Referral From Nurse   Ephraim Hamburger, Chaplain

## 2017-01-07 ENCOUNTER — Inpatient Hospital Stay (HOSPITAL_COMMUNITY): Payer: BC Managed Care – PPO

## 2017-01-07 LAB — GLUCOSE, CAPILLARY
GLUCOSE-CAPILLARY: 97 mg/dL (ref 65–99)
GLUCOSE-CAPILLARY: 92 mg/dL (ref 65–99)
Glucose-Capillary: 117 mg/dL — ABNORMAL HIGH (ref 65–99)
Glucose-Capillary: 100 mg/dL — ABNORMAL HIGH (ref 65–99)

## 2017-01-07 LAB — CBC WITH DIFFERENTIAL/PLATELET
BASOS ABS: 0 10*3/uL (ref 0.0–0.1)
Basophils Relative: 0 %
EOS ABS: 0.4 10*3/uL (ref 0.0–0.7)
Eosinophils Relative: 11 %
HCT: 30 % — ABNORMAL LOW (ref 39.0–52.0)
HEMOGLOBIN: 9.8 g/dL — AB (ref 13.0–17.0)
LYMPHS PCT: 21 %
Lymphs Abs: 0.8 10*3/uL (ref 0.7–4.0)
MCH: 31.2 pg (ref 26.0–34.0)
MCHC: 32.7 g/dL (ref 30.0–36.0)
MCV: 95.5 fL (ref 78.0–100.0)
MONO ABS: 0.5 10*3/uL (ref 0.1–1.0)
Monocytes Relative: 14 %
NEUTROS PCT: 54 %
Neutro Abs: 2.2 10*3/uL (ref 1.7–7.7)
PLATELETS: 99 10*3/uL — AB (ref 150–400)
RBC: 3.14 MIL/uL — ABNORMAL LOW (ref 4.22–5.81)
RDW: 21.7 % — ABNORMAL HIGH (ref 11.5–15.5)
WBC: 3.9 10*3/uL — ABNORMAL LOW (ref 4.0–10.5)

## 2017-01-07 LAB — RENAL FUNCTION PANEL
ALBUMIN: 2 g/dL — AB (ref 3.5–5.0)
ANION GAP: 7 (ref 5–15)
BUN: 57 mg/dL — ABNORMAL HIGH (ref 6–20)
CALCIUM: 8.4 mg/dL — AB (ref 8.9–10.3)
CO2: 21 mmol/L — ABNORMAL LOW (ref 22–32)
CREATININE: 3.27 mg/dL — AB (ref 0.61–1.24)
Chloride: 100 mmol/L — ABNORMAL LOW (ref 101–111)
GFR calc non Af Amer: 18 mL/min — ABNORMAL LOW (ref >60)
GFR, EST AFRICAN AMERICAN: 21 mL/min — AB (ref >60)
GLUCOSE: 124 mg/dL — AB (ref 65–99)
PHOSPHORUS: 4.2 mg/dL (ref 2.5–4.6)
Potassium: 4.5 mmol/L (ref 3.5–5.1)
SODIUM: 128 mmol/L — AB (ref 135–145)

## 2017-01-07 LAB — PROTIME-INR
INR: 1.59
Prothrombin Time: 19.1 seconds — ABNORMAL HIGH (ref 11.4–15.2)

## 2017-01-07 NOTE — Progress Notes (Signed)
Please see my note from 4/20  I have followed the notes over the last 2 days.  It seems that his clinical condition remains essentially unchanged despite aggressive efforts.  His providers agree that his prognosis is poor, and there seems unlikely to be any further improvement.  His chances of decompensation remain high.  He is not a liver transplant candidate, and I am afraid GI/Hepatology has nothing further to offer him.  Based on my last conversation with him, he does not seem to fully grasp this.  I am signing off this patient.  You may call us if urgent issues arise.  Dr Leone Payor will be on consult service starting tomorrow.

## 2017-01-07 NOTE — Progress Notes (Signed)
[facility] Progress Note    Assessment/ Plan:   1 Acute kidney injury superimposed on CKD G3: Initial exacerbation likely due to blood loss anemia and unstable hemodynamics.  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  He is on midodrine and he's getting colloid replacement with blood products.  Continue octreotide, and creatinine appears to be stabilizing at least for now.  Not a transplant candidate which was corroborated by GI yesterday.  Continue palliative care discussions.  His creatinine has essentially plateaued; given hyponatremia may have to add back some diuretic tomorrow but will depend on Cr, blood pressure, etc whether or not this is advisable.  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  h/o HE on lactulose.  Had a 1.5L paracentesis 4/18.  I don't recommend LVPs.  Per pt, he was never a drinker, ? NAFLD.  Hasn't had a liver biopsy.  GI consulted, cryptogenic cirrhosis Transplant discussion as above.  Holding diuretics, as above in #1  3.  Anemia/ thrombocytopenia/ coagulopathy: all due to ESLD.  Heme following, transfusing as appropriate.   Fibrinogen low as expected in liver disease.  Continue supportive care.  Fortunately Hgb and platelets have remained stable over the weekend.  4 DM- per primary  5 PVD post new L BKA- hopefully correction of coagulopathy will help oozing from stump  6.  Hyponatremia: hypervolemic hyponatremia, this is a poor prognostic sign.    7.  Dispo: now in acute care, discharge TBD.  Unclear whether or not he'll go back to CIR   Subjective:    Sleeping, awoke to my voice and we had a conversation but he didn't open his eyes.     Objective:   BP 92/72 (BP Location: Left Arm)   Pulse 84   Temp 97.7 F (36.5 C) (Oral)   Resp 16   Wt 110 kg (242 lb 8.1 oz)   SpO2 97%    BMI 31.99 kg/m   Intake/Output Summary (Last 24 hours) at 01/07/17 0933 Last data filed at 01/07/17 0932  Gross per 24 hour  Intake              720 ml  Output             1375 ml  Net             -655 ml   Weight change: -2.129 kg (-4 lb 11.1 oz)  Physical Exam: General appearance: pale, sleeping HEENT: EOMI, no JVD GI: soft, non-tender; bowel sounds normal; no masses,  no organomegaly and large ascites  Pulm: clear anteriorly CV: regular, soft systolic murmur EXT: Bilat amp;R AKA, L BKA wrapped   Imaging: No results found.  Labs: BMET  Recent Labs Lab 01/01/17 0256 01/02/17 0636 01/04/17 0437 01/05/17 0501 01/06/17 0652 01/07/17 0712  NA 132* 132* 130* 130* 129* 128*  K 4.4 4.6 4.2 4.7 4.6 4.5  CL 104 104 101 102 101 100*  CO2 22 21* 21* 21* 20* 21*  GLUCOSE 117* 91 94 125* 113* 124*  BUN 54* 57* 57* 57* 57* 57*  CREATININE 3.35* 3.33* 3.50* 3.35* 3.35* 3.27*  CALCIUM 8.1* 8.2* 8.5* 8.3* 8.4* 8.4*  PHOS  --   --   --   --   --  4.2   CBC  Recent Labs Lab 01/04/17 0437 01/05/17 0501 01/06/17 4098 01/07/17 1191  WBC 4.4 3.9* 3.5* 3.9*  NEUTROABS  --  2.3 1.8 2.2  HGB 7.2* 9.1* 9.9* 9.8*  HCT 21.6* 27.4* 29.9* 30.0*  MCV 97.3 93.2 94.6 95.5  PLT 67* 73* 84* 99*    Medications:    . feeding supplement (ENSURE ENLIVE)  237 mL Oral TID BM  . feeding supplement (PRO-STAT SUGAR FREE 64)  30 mL Oral Q1500  . insulin aspart  0-5 Units Subcutaneous QHS  . insulin aspart  0-9 Units Subcutaneous TID WC  . mouth rinse  15 mL Mouth Rinse BID  . midodrine  15 mg Oral TID WC  . multivitamin with minerals  1 tablet Oral Daily  . octreotide  100 mcg Subcutaneous Q8H  . pantoprazole  40 mg Oral BID  . rifaximin  550 mg Oral BID  . saccharomyces boulardii  250 mg Oral BID  . sodium chloride flush  3 mL Intravenous Q12H      Bufford Buttner MD 01/07/2017, 9:33 AM

## 2017-01-07 NOTE — Progress Notes (Signed)
PROGRESS NOTE    Jeffery [name]  ZOX:[id number] DOB: 1951-09-01 DOA: 01/03/2017 PCP: Julian Hy, MD    Brief Narrative:  Patient is a 44 show gentleman history of diabetes, end-stage liver disease/cirrhosis, anemia, thrombocytopenia, status post recent left BKA secondary to osteomyelitis who was admitted from the inpatient rehabilitation when his stump began to go some blood and patient noted to be anemic and thrombocytopenic. Patient was seen by hematology/oncology were recommended that patient be admitted to the acute hospital for transfusion of FFP, packed red blood cells, management of decompensated cirrhosis and ongoing management.  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 Patient status post 2 units packed red blood cell transfusion 01/04/2017 with hemoglobin currently at 9.8 from 7.2. Goal hemoglobin greater than 8 per hematology. Hematology/oncology following.  #2 chronic thrombocytopenia Secondary to cirrhosis, splenomegaly per hematology oncology. Patient was transfused 2 units of FFP on 01/03/2017. Follow.  #3 recurrent ascites secondary to portal hypertension and decompensated liver failure/end-stage liver disease Patient noted to have third spacing due to progressive liver disease.  Patient noted to have a MELD score of 29. Patient status post ultrasound guided paracenteses from right lateral abdomen with 1.5 L maximum removed of clear yellow fluid per interventional radiology on 01/03/2017 prior to transfer from inpatient rehabilitation. Patient nephrology and GI, patient deemed not a transplant candidate. Continue PPI, Xifaxan.   #4 coagulopathy Secondary to end-stage liver disease/cirrhosis. Patient status post 2 units FFP 01/03/2017. Due to significant synthetic liver dysfunction, it is felt per hematology the patient likely will not benefit from vitamin K. Goal INR per hematology INR close to 1.5 and below. Patient noted to have a very low fibrinogen level and a such 2 pillows of cryoprecipitate has been ordered per hematology. Per hematology goal INR less than 2 and fibrinogen greater than 150. If INR greater than 2 hematology recommended transfusion of 2 units of FFP. Per hematology/oncology.  #5 acute on chronic kidney disease stage III Likely secondary to prerenal azotemia in the setting of anemia and hemodynamic instability. Also concern for hepatorenal syndrome. Patient with worsening renal function initially which seemed to be plateauing. Patient currently on midodrine. Patient has been started back octreotide per nephrology. Patient s/p transfusion of 2 units packed red blood cells. Concern for hepatorenal syndrome. Nephrology following.  #6 peripheral vascular disease status post left BKA 12/23/2016 per Dr. Ophelia Charter   stump site noted to be bleeding prior to admission. Patient status post 2 units FFP. Patient s/p 2 units packed red blood cells. Spoke with Dr. Ophelia Charter and patient dressing changes of: Betadine swab to incision at time of dressing change and dry dressing changes daily with Ace wrap.  #7 diabetes mellitus CBGs have ranged from 105 -140. Hemoglobin A1c was 5.8 on 10/24/2016. Sliding scale insulin.  #8 severe protein calorie malnutrition/  severe  cachexia Dietitian following.  #9 Hyperuricemia Patient has been ordered a dose of rasburicase per hematology/oncology.  #10 hyponatremia Likely secondary to volume overload. Patient did have some complaints of shortness of breath overnight or monitor closely. If worsening shortness of breath or worsening hyponatremia will likely need to be started on Lasix however will defer to nephrology.   #11 prognosis Patient with a poor prognosis. Patient with cirrhosis of the liver with decompensation requiring paracenteses, anemic and thrombocytopenic with a coagulopathy status post 2 units FFP and being transfused 2 units packed red blood cells. Patient debilitated. Patient status post recent left BKA with recent labs in no blood after some prophylactic heparin. Patient with a meld score of 29. Patient also with severe protein calorie malnutrition. Patient deemed not a transplant candidate per GI and nephrology. Palliative care following.     DVT prophylaxis: Patient with right AKA, recent left BKA, anemic and thrombocytopenic. Code Status: Full Family Communication: Updated patient. No family at bedside. Disposition Plan: Pending hospitalization and pending palliative care consultation and other consultants.   Consultants:   Hematology/oncology Dr.Gorsuch   Gastroenterology Dr.Danis III 01/04/2017  Palliative care Dr. Linna Darner 01/04/2017  Nephrology Dr. Signe Colt  Procedures:   2 units FFP 01/03/2017  2 units packed red blood cells pending 01/04/2017  Transfusion of cryoprecipitate 01/05/2017  Antimicrobials:   None   Subjective: Patient in bed alert awake following questions. Patient denies any chest pain. Patient states had shortness of breath last night however feeling better today. No significant change with abdominal pain. Patient states less bleeding from around stump site.  Objective: Vitals:   01/06/17 1756 01/06/17 2138 01/07/17 0640 01/07/17 0930  BP: 110/66 131/65  100/64 92/72  Pulse: 78 78 82 84  Resp: Temp: 98.4 F (36.9 C) 98 F (36.7 C) 97.4 F (36.3 C) 97.7 F (36.5 C)  TempSrc: Oral Oral Oral Oral  SpO2: 98% 99% 96% 97%  Weight:  110 kg (242 lb 8.1 oz)      Intake/Output Summary (Last 24 hours) at 01/07/17 1428 Last data filed at 01/07/17 0932  Gross per 24 hour  Intake              600 ml  Output             1000 ml  Net             -400 ml   Filed Weights   01/05/17 0659 01/05/17 2215 01/06/17 2138  Weight: 110 kg (242 lb 8.1 oz) 112.1 kg (247 lb 3.2 oz) 110 kg (242 lb 8.1 oz)    Examination:  General exam: Appears calm and comfortable, Pallor.  Respiratory system: Clear to auscultation. Decreased breath sounds in the bases. Respiratory effort normal. Cardiovascular system: S1 & S2 heard, RRR. No JVD, murmurs, rubs, gallops or clicks. No pedal edema. Gastrointestinal system: Abdomen is distended, soft and tenderness to palpation in the right upper quadrant. Hepatomegaly/Splenomegaly. Normal bowel sounds heard. Central nervous system: Alert and oriented. No focal neurological deficits. Extremities: left BKA with stump dressing. Right AKA  Skin: No rashes, lesions or ulcers Psychiatry: Judgement and insight appear normal. Mood & affect appropriate.     Data Reviewed: I have personally reviewed following labs and imaging studies  CBC:  Recent Labs Lab 01/02/17 0636 01/04/17 0437 01/05/17 0501 01/06/17 0652 01/07/17 0712  WBC 5.6 4.4 3.9* 3.5* 3.9*  NEUTROABS  --   --  2.3 1.8 2.2  HGB  8.0* 7.2* 9.1* 9.9* 9.8*  HCT 23.1* 21.6* 27.4* 29.9* 30.0*  MCV 95.5 97.3 93.2 94.6 95.5  PLT 67* 67* 73* 84* 99*   Basic Metabolic Panel:  Recent Labs Lab 01/02/17 0636 01/04/17 0437 01/05/17 0501 01/06/17 0652 01/07/17 0712  NA 132* 130* 130* 129* 128*  K 4.6 4.2 4.7 4.6 4.5  CL 104 101 102 101 100*  CO2 21* 21* 21* 20* 21*  GLUCOSE 91 94 125* 113* 124*  BUN 57* 57* 57* 57* 57*  CREATININE 3.33* 3.50*  3.35* 3.35* 3.27*  CALCIUM 8.2* 8.5* 8.3* 8.4* 8.4*  PHOS  --   --   --   --  4.2   GFR: Estimated Creatinine Clearance: 28.9 mL/min (A) (by C-G formula based on SCr of 3.27 mg/dL (H)). Liver Function Tests:  Recent Labs Lab 01/04/17 0729 01/05/17 0501 01/06/17 0652 01/07/17 0712  AST 37 33 32  --   ALT 13* 11* 10*  --   ALKPHOS 70 72 66  --   BILITOT 2.9* 4.0* 3.0*  --   PROT 7.3 7.4 7.3  --   ALBUMIN 1.9* 1.8* 2.0* 2.0*   No results for input(s): LIPASE, AMYLASE in the last 168 hours. No results for input(s): AMMONIA in the last 168 hours. Coagulation Profile:  Recent Labs Lab 01/02/17 0636 01/03/17 0628 01/05/17 0501 01/06/17 0652 01/07/17 0712  INR 2.25 2.20 1.93 1.59 1.59   Cardiac Enzymes: No results for input(s): CKTOTAL, CKMB, CKMBINDEX, TROPONINI in the last 168 hours. BNP (last 3 results) No results for input(s): PROBNP in the last 8760 hours. HbA1C: No results for input(s): HGBA1C in the last 72 hours. CBG:  Recent Labs Lab 01/06/17 1126 01/06/17 1621 01/06/17 2145 01/07/17 0740 01/07/17 1138  GLUCAP 145* 140* 132* 117* 100*   Lipid Profile: No results for input(s): CHOL, HDL, LDLCALC, TRIG, CHOLHDL, LDLDIRECT in the last 72 hours. Thyroid Function Tests: No results for input(s): TSH, T4TOTAL, FREET4, T3FREE, THYROIDAB in the last 72 hours. Anemia Panel: No results for input(s): VITAMINB12, FOLATE, FERRITIN, TIBC, IRON, RETICCTPCT in the last 72 hours. Sepsis Labs: No results for input(s): PROCALCITON, LATICACIDVEN in the last 168 hours.  Recent Results (from the past 240 hour(s))  Fungus Culture With Stain     Status: None (Preliminary result)   Collection Time: 12/29/16  1:26 PM  Result Value Ref Range Status   Fungus Stain Final report  Final    Comment: (NOTE) Performed At: [facility] [address] [place], [place] [id number] Mila Homer MD BJ:[id number]    Fungus (Mycology) Culture PENDING  Incomplete   Fungal  Source FLUID  Final  Culture, body fluid-bottle     Status: None   Collection Time: 12/29/16  1:26 PM  Result Value Ref Range Status   Specimen Description FLUID  Final   Special Requests PERITONEAL  Final   Culture NO GROWTH 5 DAYS  Final   Report Status 01/03/2017 FINAL  Final  Gram stain     Status: None   Collection Time: 12/29/16  1:26 PM  Result Value Ref Range Status   Specimen Description FLUID  Final   Special Requests PERITONEAL  Final   Gram Stain NO WBC SEEN NO ORGANISMS SEEN   Final   Report Status 12/29/2016 FINAL  Final  Fungus Culture Result     Status: None   Collection Time: 12/29/16  1:26 PM  Result Value Ref Range Status   Result 1 Comment  Final  Comment: (NOTE) KOH/Calcofluor preparation:  no fungus observed. Performed At: [facility] [address] [place], [place] [id number] Mila Homer MD WU:[id number]   Culture, Urine     Status: None   Collection Time: 12/31/16  2:41 PM  Result Value Ref Range Status   Specimen Description URINE, CATHETERIZED  Final   Special Requests NONE  Final   Culture NO GROWTH  Final   Report Status 01/01/2017 FINAL  Final         Radiology Studies: No results found.      Scheduled Meds: . feeding supplement (ENSURE ENLIVE)  237 mL Oral TID BM  . feeding supplement (PRO-STAT SUGAR FREE 64)  30 mL Oral Q1500  . insulin aspart  0-5 Units Subcutaneous QHS  . insulin aspart  0-9 Units Subcutaneous TID WC  . mouth rinse  15 mL Mouth Rinse BID  . midodrine  15 mg Oral TID WC  . multivitamin with minerals  1 tablet Oral Daily  . octreotide  100 mcg Subcutaneous Q8H  . pantoprazole  40 mg Oral BID  . rifaximin  550 mg Oral BID  . saccharomyces boulardii  250 mg Oral BID  . sodium chloride flush  3 mL Intravenous Q12H   Continuous Infusions: . sodium chloride    . sodium chloride       LOS: 4 days    Time spent: 35 mins    Mohid Furuya, MD [facility] Pager [phone]  If  7PM-7AM, please contact night-coverage [email or web address] Password [facility] 01/07/2017, 2:28 PM

## 2017-01-07 NOTE — Progress Notes (Signed)
Patient c/o SOB, increased HOB and lowered legs of bed for better inhalation.  VS 116/67, HR 83, RR 22, SATS 100% on room air.  Patient felt better after repositioning.

## 2017-01-08 ENCOUNTER — Inpatient Hospital Stay (HOSPITAL_COMMUNITY): Payer: BC Managed Care – PPO

## 2017-01-08 DIAGNOSIS — R180 Malignant ascites: Secondary | ICD-10-CM

## 2017-01-08 DIAGNOSIS — R109 Unspecified abdominal pain: Secondary | ICD-10-CM

## 2017-01-08 LAB — COMPREHENSIVE METABOLIC PANEL
ALT: 10 U/L — ABNORMAL LOW (ref 17–63)
ANION GAP: 6 (ref 5–15)
AST: 36 U/L (ref 15–41)
Albumin: 1.8 g/dL — ABNORMAL LOW (ref 3.5–5.0)
Alkaline Phosphatase: 70 U/L (ref 38–126)
BUN: 61 mg/dL — AB (ref 6–20)
CALCIUM: 8.5 mg/dL — AB (ref 8.9–10.3)
CO2: 22 mmol/L (ref 22–32)
Chloride: 103 mmol/L (ref 101–111)
Creatinine, Ser: 3.11 mg/dL — ABNORMAL HIGH (ref 0.61–1.24)
GFR calc Af Amer: 22 mL/min — ABNORMAL LOW (ref >60)
GFR, EST NON AFRICAN AMERICAN: 19 mL/min — AB (ref >60)
GLUCOSE: 95 mg/dL (ref 65–99)
POTASSIUM: 5 mmol/L (ref 3.5–5.1)
SODIUM: 131 mmol/L — AB (ref 135–145)
TOTAL PROTEIN: 7.1 g/dL (ref 6.5–8.1)
Total Bilirubin: 2.9 mg/dL — ABNORMAL HIGH (ref 0.3–1.2)

## 2017-01-08 LAB — PROTIME-INR
INR: 1.73
PROTHROMBIN TIME: 20.5 s — AB (ref 11.4–15.2)

## 2017-01-08 LAB — CBC WITH DIFFERENTIAL/PLATELET
BASOS ABS: 0 10*3/uL (ref 0.0–0.1)
Basophils Relative: 0 %
EOS ABS: 0.4 10*3/uL (ref 0.0–0.7)
Eosinophils Relative: 11 %
HEMATOCRIT: 30.2 % — AB (ref 39.0–52.0)
Hemoglobin: 9.9 g/dL — ABNORMAL LOW (ref 13.0–17.0)
Lymphocytes Relative: 20 %
Lymphs Abs: 0.7 10*3/uL (ref 0.7–4.0)
MCH: 31.4 pg (ref 26.0–34.0)
MCHC: 32.8 g/dL (ref 30.0–36.0)
MCV: 95.9 fL (ref 78.0–100.0)
MONOS PCT: 12 %
Monocytes Absolute: 0.4 10*3/uL (ref 0.1–1.0)
NEUTROS ABS: 2 10*3/uL (ref 1.7–7.7)
Neutrophils Relative %: 57 %
PLATELETS: 98 10*3/uL — AB (ref 150–400)
RBC: 3.15 MIL/uL — AB (ref 4.22–5.81)
RDW: 21.7 % — AB (ref 11.5–15.5)
WBC: 3.5 10*3/uL — AB (ref 4.0–10.5)

## 2017-01-08 LAB — GLUCOSE, CAPILLARY
GLUCOSE-CAPILLARY: 91 mg/dL (ref 65–99)
Glucose-Capillary: 89 mg/dL (ref 65–99)
Glucose-Capillary: 183 mg/dL — ABNORMAL HIGH (ref 65–99)
Glucose-Capillary: 203 mg/dL — ABNORMAL HIGH (ref 65–99)

## 2017-01-08 MED ORDER — FUROSEMIDE 10 MG/ML IJ SOLN
80.0000 mg | Freq: Once | INTRAMUSCULAR | Status: AC
  Administered 2017-01-08: 80 mg via INTRAVENOUS
  Filled 2017-01-08: qty 8

## 2017-01-08 MED ORDER — ALBUMIN HUMAN 25 % IV SOLN
50.0000 g | Freq: Once | INTRAVENOUS | Status: DC
  Filled 2017-01-08: qty 200

## 2017-01-08 MED ORDER — ALBUMIN HUMAN 25 % IV SOLN: 50.0000 g | Freq: Once | INTRAVENOUS | Status: DC

## 2017-01-08 NOTE — Progress Notes (Signed)
PROGRESS NOTE    Jeffery [name]  ZOX:[id number] DOB: 02/09/1951 DOA: 01/03/2017 PCP: Julian Hy, MD    Brief Narrative:  Patient is a 41 show gentleman history of diabetes, end-stage liver disease/cirrhosis, anemia, thrombocytopenia, status post recent left BKA secondary to osteomyelitis who was admitted from the inpatient rehabilitation when his stump began to go some blood and patient noted to be anemic and thrombocytopenic. Patient was seen by hematology/oncology were recommended that patient be admitted to the acute hospital for transfusion of FFP, packed red blood cells, management of decompensated cirrhosis and ongoing management.  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 Patient status post 2 units packed red blood cell transfusion 01/04/2017 with hemoglobin currently at 9.9 from 7.2. Goal hemoglobin greater than 8 per hematology. Hematology/oncology following.  #2 chronic thrombocytopenia Secondary to cirrhosis, splenomegaly per hematology oncology. Patient was transfused 2 units of FFP on 01/03/2017. Follow.  #3 recurrent ascites secondary to portal hypertension and decompensated liver failure/end-stage liver disease Patient noted to have third spacing due to progressive liver disease.  Patient noted to have a MELD score of 29. Patient status post ultrasound guided paracenteses from right lateral abdomen with 1.5 L maximum removed of clear yellow fluid per interventional radiology on 01/03/2017 prior to transfer from inpatient rehabilitation. Patient nephrology and GI, patient deemed not a transplant candidate. Continue PPI, Xifaxan. Patient noted to have complaints of shortness of breath and patient feels likely a ascites pushing on his diaphragm. Abdominal ultrasound obtained 01/08/2017 consistent with large ascites. Ultrasound-guided paracentesis on 01/10/2017. Will give a dose of albumin with paracentesis. Place on Lasix 80 mg IV daily per nephrology recommendations.  #4 coagulopathy Secondary to end-stage liver disease/cirrhosis. Patient status post 2 units FFP 01/03/2017. Due to significant synthetic liver dysfunction, it is felt per hematology the patient likely will not benefit from vitamin K. Goal INR per hematology INR close to 1.5 and below. Patient noted to have a very low fibrinogen level and a such 2 pillows of cryoprecipitate has been ordered per hematology. Per hematology goal INR less than 2 and fibrinogen greater than 150. If INR greater than 2 hematology recommended transfusion of 2 units of FFP. Per hematology/oncology.  #5 acute on chronic kidney disease stage III Likely secondary to prerenal azotemia in the setting of anemia and hemodynamic instability and probable hepatorenal syndrome. Patient with worsening renal function initially which seemed to be plateauing. Patient currently on midodrine. Patient has been started back octreotide per nephrology. Patient s/p transfusion of 2 units packed red blood cells. Concern for hepatorenal syndrome. Place on Lasix 80 mg IV daily per recommendations of nephrology. Nephrology following.  #6 peripheral vascular disease status post left BKA 12/23/2016 per Dr. Ophelia Charter   stump site noted to be bleeding prior to admission. Patient  status post 2 units FFP.  Patient s/p 2 units packed red blood cells. Spoke with Dr. Ophelia Charter and patient dressing changes of: Betadine swab to incision at time of dressing change and dry dressing changes daily with Ace wrap.  #7 diabetes mellitus CBGs have ranged from 91-183. Hemoglobin A1c was 5.8 on 10/24/2016. Sliding scale insulin.  #8 severe protein calorie malnutrition/ severe cachexia Dietitian following.  #9 Hyperuricemia Patient has been ordered a dose of rasburicase per hematology/oncology.  #10 hyponatremia Likely secondary to volume overload. Patient did have some complaints of shortness of breath overnight or monitor closely. Will place on Lasix 80 mg IV daily per nephrology recommendations.   #11 prognosis Patient with a poor prognosis. Patient with cirrhosis of the liver with decompensation requiring paracenteses, anemic and thrombocytopenic with a coagulopathy status post 2 units FFP and being transfused 2 units packed red blood cells. Patient debilitated. Patient status post recent left BKA with recent labs in no blood after some prophylactic heparin. Patient with a meld score of 29. Patient also with severe protein calorie malnutrition. Patient deemed not a transplant candidate per GI and nephrology. Palliative care following.     DVT prophylaxis: Patient with right AKA, recent left BKA, anemic and thrombocytopenic. Code Status: Full Family Communication: Updated patient and friend at bedside. Disposition Plan: Pending hospitalization and pending palliative care consultation and other consultants, likely home with palliative or hospice..   Consultants:   Hematology/oncology Dr.Gorsuch   Gastroenterology Dr.Danis III 01/04/2017  Palliative care Dr. Linna Darner 01/04/2017  Nephrology Dr. Signe Colt  Procedures:   2 units FFP 01/03/2017  2 units packed red blood cells pending 01/04/2017  Transfusion of cryoprecipitate 01/05/2017  Abdominal US 01/08/2017  Antimicrobials:     None   Subjective: Patient in bed alert awake following questions. Patient denies any chest pain. Patient states had shortness of breath last night however feeling better today. No significant change with abdominal pain. Patient states less bleeding from around stump site.  Objective: Vitals:   01/07/17 2001 01/08/17 0453 01/08/17 0455 01/08/17 0837  BP: 111/79  134/62 124/67  Pulse: 85  75 87  Resp: 17   17  Temp: 97.6 F (36.4 C)  97.6 F (36.4 C)   TempSrc: Oral  Oral Oral  SpO2: 99%  99% 99%  Weight:  110 kg (242 lb 8.1 oz)    Height:   (1.854 m)      Intake/Output Summary (Last 24 hours) at 01/08/17 1212 Last data filed at 01/08/17 1000  Gross per 24 hour  Intake              180 ml  Output              800 ml  Net             -620 ml   Filed Weights   01/05/17 2215 01/06/17 2138 01/08/17 0453  Weight: 112.1 kg (247 lb 3.2 oz) 110 kg (242 lb 8.1 oz) 110 kg (242 lb 8.1 oz)    Examination:  General exam: Appears calm and comfortable, Pallor.  Respiratory system: Clear to auscultation. Decreased breath sounds in the bases. Respiratory effort normal. Cardiovascular system: S1 & S2 heard, RRR. No JVD, murmurs, rubs, gallops or clicks. No pedal edema. Gastrointestinal system: Abdomen is distended, soft and tenderness to palpation in the right upper quadrant. Hepatomegaly/Splenomegaly. Ascites. Normal bowel sounds heard. Central nervous system: Alert and oriented. No focal neurological deficits. Extremities: left BKA with stump dressing. Right AKA  Skin: No rashes, lesions  or ulcers Psychiatry: Judgement and insight appear normal. Mood & affect appropriate.     Data Reviewed: I have personally reviewed following labs and imaging studies  CBC:  Recent Labs Lab 01/04/17 0437 01/05/17 0501 01/06/17 0652 01/07/17 0712 01/08/17 0613  WBC 4.4 3.9* 3.5* 3.9* 3.5*  NEUTROABS  --  2.3 1.8 2.2 2.0  HGB 7.2* 9.1* 9.9* 9.8* 9.9*  HCT 21.6* 27.4* 29.9* 30.0*  30.2*  MCV 97.3 93.2 94.6 95.5 95.9  PLT 67* 73* 84* 99* 98*   Basic Metabolic Panel:  Recent Labs Lab 01/04/17 0437 01/05/17 0501 01/06/17 0652 01/07/17 0712 01/08/17 0613  NA 130* 130* 129* 128* 131*  K 4.2 4.7 4.6 4.5 5.0  CL 101 102 101 100* 103  CO2 21* 21* 20* 21* 22  GLUCOSE 94 125* 113* 124* 95  BUN 57* 57* 57* 57* 61*  CREATININE 3.50* 3.35* 3.35* 3.27* 3.11*  CALCIUM 8.5* 8.3* 8.4* 8.4* 8.5*  PHOS  --   --   --  4.2  --    GFR: Estimated Creatinine Clearance: 30.4 mL/min (A) (by C-G formula based on SCr of 3.11 mg/dL (H)). Liver Function Tests:  Recent Labs Lab 01/04/17 0729 01/05/17 0501 01/06/17 0652 01/07/17 0712 01/08/17 0613  AST 37 33 32  --  36  ALT 13* 11* 10*  --  10*  ALKPHOS 70 72 66  --  70  BILITOT 2.9* 4.0* 3.0*  --  2.9*  PROT 7.3 7.4 7.3  --  7.1  ALBUMIN 1.9* 1.8* 2.0* 2.0* 1.8*   No results for input(s): LIPASE, AMYLASE in the last 168 hours. No results for input(s): AMMONIA in the last 168 hours. Coagulation Profile:  Recent Labs Lab 01/03/17 0628 01/05/17 0501 01/06/17 0652 01/07/17 0712 01/08/17 0613  INR 2.20 1.93 1.59 1.59 1.73   Cardiac Enzymes: No results for input(s): CKTOTAL, CKMB, CKMBINDEX, TROPONINI in the last 168 hours. BNP (last 3 results) No results for input(s): PROBNP in the last 8760 hours. HbA1C: No results for input(s): HGBA1C in the last 72 hours. CBG:  Recent Labs Lab 01/07/17 0740 01/07/17 1138 01/07/17 1637 01/07/17 2227 01/08/17 0807  GLUCAP 117* 100* 97 92 91   Lipid Profile: No results for input(s): CHOL, HDL, LDLCALC, TRIG, CHOLHDL, LDLDIRECT in the last 72 hours. Thyroid Function Tests: No results for input(s): TSH, T4TOTAL, FREET4, T3FREE, THYROIDAB in the last 72 hours. Anemia Panel: No results for input(s): VITAMINB12, FOLATE, FERRITIN, TIBC, IRON, RETICCTPCT in the last 72 hours. Sepsis Labs: No results for input(s): PROCALCITON, LATICACIDVEN in the last 168 hours.  Recent  Results (from the past 240 hour(s))  Fungus Culture With Stain     Status: None (Preliminary result)   Collection Time: 12/29/16  1:26 PM  Result Value Ref Range Status   Fungus Stain Final report  Final    Comment: (NOTE) Performed At: [facility] [address] [place], [place] [id number] Mila Homer MD WU:[id number]    Fungus (Mycology) Culture PENDING  Incomplete   Fungal Source FLUID  Final  Culture, body fluid-bottle     Status: None   Collection Time: 12/29/16  1:26 PM  Result Value Ref Range Status   Specimen Description FLUID  Final   Special Requests PERITONEAL  Final   Culture NO GROWTH 5 DAYS  Final   Report Status 01/03/2017 FINAL  Final  Gram stain     Status: None   Collection Time: 12/29/16  1:26 PM  Result Value Ref Range  Status   Specimen Description FLUID  Final   Special Requests PERITONEAL  Final   Gram Stain NO WBC SEEN NO ORGANISMS SEEN   Final   Report Status 12/29/2016 FINAL  Final  Fungus Culture Result     Status: None   Collection Time: 12/29/16  1:26 PM  Result Value Ref Range Status   Result 1 Comment  Final    Comment: (NOTE) KOH/Calcofluor preparation:  no fungus observed. Performed At: [facility] [address] [place], [place] [id number] Mila Homer MD JY:[id number]   Culture, Urine     Status: None   Collection Time: 12/31/16  2:41 PM  Result Value Ref Range Status   Specimen Description URINE, CATHETERIZED  Final   Special Requests NONE  Final   Culture NO GROWTH  Final   Report Status 01/01/2017 FINAL  Final         Radiology Studies: US Abdomen Limited  Result Date: 01/08/2017 CLINICAL DATA:  Assess for ascites EXAM: LIMITED ABDOMEN ULTRASOUND FOR ASCITES TECHNIQUE: Limited ultrasound survey for ascites was performed in all four abdominal quadrants. COMPARISON:  None. FINDINGS: There is a large amount of ascites noted in all 4 quadrants of the abdomen/ pelvis. IMPRESSION: Large volume ascites.  Electronically Signed   By: Charlett Nose M.D.   On: 01/08/2017 10:29        Scheduled Meds: . feeding supplement (ENSURE ENLIVE)  237 mL Oral TID BM  . feeding supplement (PRO-STAT SUGAR FREE 64)  30 mL Oral Q1500  . insulin aspart  0-5 Units Subcutaneous QHS  . insulin aspart  0-9 Units Subcutaneous TID WC  . mouth rinse  15 mL Mouth Rinse BID  . midodrine  15 mg Oral TID WC  . multivitamin with minerals  1 tablet Oral Daily  . octreotide  100 mcg Subcutaneous Q8H  . pantoprazole  40 mg Oral BID  . rifaximin  550 mg Oral BID  . saccharomyces boulardii  250 mg Oral BID  . sodium chloride flush  3 mL Intravenous Q12H   Continuous Infusions: . sodium chloride    . sodium chloride       LOS: 5 days    Time spent: 35 mins    Asuna Peth, MD [facility] Pager [phone]  If 7PM-7AM, please contact night-coverage [email or web address] Password [email or web address] 01/08/2017, 12:12 PM

## 2017-01-08 NOTE — Progress Notes (Signed)
Jeffery [name]   DOB:1951-04-04   NW#:[id number]    Subjective: He complained of problems with breathing due to significant abdominal distention from ascites.  According to him, the bleeding in the leg stump has almost completely resolved.  There were only 2 small areas with mild bruising but overall, he felt that multiple transfusions of blood products has helped resolve the bleeding  Objective:  Vitals:   01/07/17 2001 01/08/17 0455  BP: 111/79 134/62  Pulse: 85 75  Resp: 17   Temp: 97.6 F (36.4 C) 97.6 F (36.4 C)     Intake/Output Summary (Last 24 hours) at 01/08/17 0721 Last data filed at 01/08/17 0600  Gross per 24 hour  Intake              120 ml  Output              850 ml  Net             -730 ml    GENERAL:alert, no distress and comfortable SKIN: skin color, texture, turgor are normal, no rashes or significant lesions.  Extensive bruises are noted EYES: normal, Conjunctiva are pink and non-injected, sclera clear OROPHARYNX: Dry mucous membrane NECK: supple, thyroid normal size, non-tender, without nodularity ABDOMEN:abdomen soft, non-tender and normal bowel sounds.  Grossly distended with ascites  Musculoskeletal:no cyanosis of digits and no clubbing  NEURO: alert & oriented x 3 with fluent speech, no focal motor/sensory deficits   Labs:  Lab Results  Component Value Date   WBC 3.5 (L) 01/08/2017   HGB 9.9 (L) 01/08/2017   HCT 30.2 (L) 01/08/2017   MCV 95.9 01/08/2017   PLT 98 (L) 01/08/2017   NEUTROABS PENDING 01/08/2017    Lab Results  Component Value Date   NA 131 (L) 01/08/2017   K 5.0 01/08/2017   CL 103 01/08/2017   CO2 22 01/08/2017    Assessment & Plan:   Anemia of chronic illness and secondary to chronic blood loss and chronic kidney disease He has multifactorial anemia.  I recommend blood transfusion to keep hemoglobin greater than 8 I recommend consideration for ESA in the future He has received 2 units of blood on 01/04/2017 Hemoglobin  is stable.  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, end-stage liver cirrhosis I recommend gentle transfusion support However, his urine output needs to be monitored closely to avoid complication related to volume overload Vitamin K is unlikely going to work given significant synthetic liver dysfunction He has received2 units of FFP on 01/03/2017 Fibrinogen level is very low and he has received 2 pools of cryoprecipitate on 01/05/2017 The transfusion of FFP and cryoprecipitate has helped him He does not need further transfusions unless he started to bleed again I would not recommend checking his INR on a regular basis unless he starts to bleed  Recurrent ascites secondary to portal hypertension and decompensated liver failure, end stage liver disease The patient had evidence of third spacing and progressive liver failure His hepatic function is poor Based on his last known liver function tests, the patient is at Child Pugh score C Based on most recent available numbers, his calculated MELD score is 29 His overall survival is  poor Appreciate GI consult.  The patient is not a transplant candidate We discussed prognosis, likely measured in a few months He wants to continue on aggressive supportive measures if possible  Acute on chronic renal failure, hepatorenal syndrome The patient have clinical chronic kidney disease stage IV The patient has intravascular volume depletion likely secondary to third spacing and poor oral intake Rather than IV fluid resuscitation, I think he would benefit greatly with intravascular volume expansion with  transfusion of blood products Appreciate nephrology's consult Uric acid level is very high.  I have ordered 1 dose of rasburicase on 01/05/2017 There is a risk of further deterioration of renal function with end-stage liver disease  History of osteomyelitis, poor wound healing, improving Continue wound dressing for now  Severe protein calorie malnutrition and severe cachexia Recommend full dietitian consult and consideration for appetite stimulant  Goals of care discussion The patient desiresfull code and everything done Appreciate palliative care consult He appears to grasp his poor prognosis He understands the only thing I can do for him is transfusion of blood products as needed  Discharge planning From my standpoint, I have accomplished everything needed to be done from the hematology standpoint I would defer to primary service and rehab facility service to determine whether patient is a rehab candidate I will keep him on my list I will check on him periodically every 1-2 weeks and order labs and transfuse as needed Please call if questions arise  Artis Delay, MD 01/08/2017  7:21 AM

## 2017-01-08 NOTE — Progress Notes (Signed)
Patient ID: Jeffery [name], male   DOB: 20-Dec-1950, 66 y.o.   MRN: [id number] S:Complaining of RUQ pain and tense abdomen.  "it's interfering with physical therapy" O:BP 124/67 (BP Location: Left Arm)   Pulse 87   Temp 97.6 F (36.4 C) (Oral)   Resp 17   Ht  (1.854 m)   Wt 110 kg (242 lb 8.1 oz)   SpO2 99%   BMI 31.99 kg/m   Intake/Output Summary (Last 24 hours) at 01/08/17 0957 Last data filed at 01/08/17 0950  Gross per 24 hour  Intake              120 ml  Output              800 ml  Net             -680 ml   Intake/Output: I/O last 3 completed shifts: In: 480 [P.O.:480] Out: 1550 [Urine:1550]  Intake/Output this shift:  Total I/O In: 0  Out: 100 [Urine:100] Weight change: 0 kg (0 lb) Gen: WM in NAD, pale and fatigued CVS:no rub Resp:decreased BS at bases AOZ:HYQMVHQIO, tense, tender to palpation, +abdominal wall edema Ext:s/p RAKA, LBKA, + anasarca   Recent Labs Lab 01/02/17 0636 01/04/17 0437 01/04/17 0729 01/05/17 0501 01/06/17 0652 01/07/17 0712 01/08/17 0613  NA 132* 130*  --  130* 129* 128* 131*  K 4.6 4.2  --  4.7 4.6 4.5 5.0  CL 104 101  --  102 101 100* 103  CO2 21* 21*  --  21* 20* 21* 22  GLUCOSE 91 94  --  125* 113* 124* 95  BUN 57* 57*  --  57* 57* 57* 61*  CREATININE 3.33* 3.50*  --  3.35* 3.35* 3.27* 3.11*  ALBUMIN  --   --  1.9* 1.8* 2.0* 2.0* 1.8*  CALCIUM 8.2* 8.5*  --  8.3* 8.4* 8.4* 8.5*  PHOS  --   --   --   --   --  4.2  --   AST  --   --  37 33 32  --  36  ALT  --   --  13* 11* 10*  --  10*   Liver Function Tests:  Recent Labs Lab 01/05/17 0501 01/06/17 0652 01/07/17 0712 01/08/17 0613  AST 33 32  --  36  ALT 11* 10*  --  10*  ALKPHOS 72 66  --  70  BILITOT 4.0* 3.0*  --  2.9*  PROT 7.4 7.3  --  7.1  ALBUMIN 1.8* 2.0* 2.0* 1.8*   No results for input(s): LIPASE, AMYLASE in the last 168 hours. No results for input(s): AMMONIA in the last 168 hours. CBC:  Recent Labs Lab 01/04/17 0437  01/05/17 0501  01/06/17 0652 01/07/17 0712 01/08/17 0613  WBC 4.4  --  3.9* 3.5* 3.9* 3.5*  NEUTROABS  --   < > 2.3 1.8 2.2 2.0  HGB 7.2*  --  9.1* 9.9* 9.8* 9.9*  HCT 21.6*  --  27.4* 29.9* 30.0* 30.2*  MCV 97.3  --  93.2 94.6 95.5 95.9  PLT 67*  --  73* 84* 99* 98*  < > = values in this interval not displayed. Cardiac Enzymes: No results for input(s): CKTOTAL, CKMB, CKMBINDEX, TROPONINI in the last 168 hours. CBG:  Recent Labs Lab 01/07/17 0740 01/07/17 1138 01/07/17 1637 01/07/17 2227 01/08/17 0807  GLUCAP 117* 100* 97 92 91    Iron Studies: No results for input(s): IRON, TIBC, TRANSFERRIN,  FERRITIN in the last 72 hours. Studies/Results: No results found. . feeding supplement (ENSURE ENLIVE)  237 mL Oral TID BM  . feeding supplement (PRO-STAT SUGAR FREE 64)  30 mL Oral Q1500  . insulin aspart  0-5 Units Subcutaneous QHS  . insulin aspart  0-9 Units Subcutaneous TID WC  . mouth rinse  15 mL Mouth Rinse BID  . midodrine  15 mg Oral TID WC  . multivitamin with minerals  1 tablet Oral Daily  . octreotide  100 mcg Subcutaneous Q8H  . pantoprazole  40 mg Oral BID  . rifaximin  550 mg Oral BID  . saccharomyces boulardii  250 mg Oral BID  . sodium chloride flush  3 mL Intravenous Q12H    BMET    Component Value Date/Time   NA 131 (L) 01/08/2017 0613   NA 133 (L) 12/04/2016 1031   K 5.0 01/08/2017 0613   K 4.4 12/04/2016 1031   CL 103 01/08/2017 0613   CO2 22 01/08/2017 0613   CO2 22 12/04/2016 1031   GLUCOSE 95 01/08/2017 0613   GLUCOSE 140 12/04/2016 1031   BUN 61 (H) 01/08/2017 0613   BUN 31.8 (H) 12/04/2016 1031   CREATININE 3.11 (H) 01/08/2017 0613   CREATININE 1.6 (H) 12/04/2016 1031   CALCIUM 8.5 (L) 01/08/2017 0613   CALCIUM 8.3 (L) 12/04/2016 1031   GFRNONAA 19 (L) 01/08/2017 0613   GFRAA 22 (L) 01/08/2017 0613   CBC    Component Value Date/Time   WBC 3.5 (L) 01/08/2017 0613   RBC 3.15 (L) 01/08/2017 0613   HGB 9.9 (L) 01/08/2017 0613   HGB 10.2 (L) 12/04/2016  1030   HCT 30.2 (L) 01/08/2017 0613   HCT 30.5 (L) 12/04/2016 1030   PLT 98 (L) 01/08/2017 0613   PLT 115 (L) 12/04/2016 1030   MCV 95.9 01/08/2017 0613   MCV 100.7 (H) 12/04/2016 1030   MCH 31.4 01/08/2017 0613   MCHC 32.8 01/08/2017 0613   RDW 21.7 (H) 01/08/2017 0613   RDW 20.9 (H) 12/04/2016 1030   LYMPHSABS 0.7 01/08/2017 0613   LYMPHSABS 0.8 (L) 12/04/2016 1030   MONOABS 0.4 01/08/2017 0613   MONOABS 0.6 12/04/2016 1030   EOSABS 0.4 01/08/2017 0613   EOSABS 0.7 (H) 12/04/2016 1030   BASOSABS 0.0 01/08/2017 0613   BASOSABS 0.0 12/04/2016 1030     Assessment/Plan:  1. AKI/CKD stage 3- in setting of ABLA and low BP with possible hepatorenal syndrome.  He appears to be responding to midodrine/octreotide with small improvement of Scr.  He is not a dialysis or transplant candidate and would continue with conservative management for now.  Has diuresed some, however still with marked ascites and discomfort.   Would hold off on therapeutic paracentesis for another day or so.  We discussed at length the situation and that without a transplant, this is likely a terminal event.  He understands the risks of a paracentesis, however he cannot tolerate the discomfort much longer and wants to proceed with another one on Wednesday of this week.  Will arrange it with primary team and IR. 2. ESLD- grade 1 varices and severe portal hypertensive gastropathy, diuretic resistant ascites.  Not a transplant candidate per GI. 3. Anemia/thrombocytopenia- due to ESLD and CKD 4. DM- per primary 5. PVD s/p LKBA 6. Hyponatremia- due to anasarca 7. Disposition- poor overall prognosis.  Agree with palliative care consult to help set goals/limits of care as he is not a transplant candidate due to multiple co-morbidities  and PVD.    Irena Cords, MD [employer] [phone]

## 2017-01-08 NOTE — Progress Notes (Signed)
Physical Therapy Treatment Patient Details Name: Jeffery [name] MRN: [id number] DOB: [birth year] Today's Date: 01/08/2017    History of Present Illness Pt is a 66 yo male admitted from CIR on 01/03/17 with thrombocytopenia and coagulopathy. PMH significant for cirrhosis, osteomyelitis, DM2, depression, encephalopathy, R AKA and L BKA.     PT Comments    Continuing work on functional mobility and activity tolerance;  Able to do more work in upright sitting including weight shifting and reciprocal scooting of hip sin prep for transfers; Ended session in bed as pt had to move his bowels and isn't moving quite well enough for [facility] yet; will continue to follow.  Follow Up Recommendations  SNF     Equipment Recommendations  None recommended by PT    Recommendations for Other Services       Precautions / Restrictions Precautions Precautions: Fall Restrictions RLE Weight Bearing: Non weight bearing (RT AKA) LLE Weight Bearing: Non weight bearing (LT AKA)    Mobility  Bed Mobility Overal bed mobility: Needs Assistance Bed Mobility: Rolling;Supine to Sit;Sit to Supine Rolling: Min guard   Supine to sit: Mod assist;+2 for physical assistance Sit to supine: Min assist   General bed mobility comments: Good use of rails to roll; mod assist of 2 to elevate trunk to sit; pt gave a good effort with pulling up (holding PT's arm) to sit; min assist to help supine  Transfers Overall transfer level: Needs assistance   Transfers: Anterior-Posterior Transfer (simultated)       Anterior-Posterior transfers: Mod assist   General transfer comment: Mod assist and use of bed pad to work on reciprocal scooting of hips posteriorly to simulate anterior posterior transfer  Ambulation/Gait                 Stairs            Wheelchair Mobility    Modified Rankin (Stroke Patients Only)       Balance     Sitting balance-Leahy Scale: Fair Sitting balance - Comments: worked  on weight shifting side to side; Fatigued quickly                                    Cognition Arousal/Alertness: Awake/alert Behavior During Therapy: WFL for tasks assessed/performed Overall Cognitive Status: Within Functional Limits for tasks assessed                                 General Comments: Tending to laugh and joke; likes puns      Exercises      General Comments        Pertinent Vitals/Pain Pain Assessment: Faces Faces Pain Scale: Hurts little more Pain Location: Discomfort at abdomen and scrotum Pain Descriptors / Indicators: Grimacing Pain Intervention(s): Monitored during session    Home Living                      Prior Function            PT Goals (current goals can now be found in the care plan section) Acute Rehab PT Goals Patient Stated Goal: to get back to work PT Goal Formulation: With patient Time For Goal Achievement: 01/20/17 Potential to Achieve Goals: Fair Progress towards PT goals: Progressing toward goals (slowly)    Frequency    Min 3X/week  PT Plan Discharge plan needs to be updated    Co-evaluation             End of Session Equipment Utilized During Treatment:  (bed pad) Activity Tolerance: Patient limited by fatigue Patient left: in bed;with call bell/phone within reach (on bedpan) Nurse Communication: Mobility status;Other (comment) (pt is on bedpan) PT Visit Diagnosis: Pain;Muscle weakness (generalized) (M62.81) Pain - Right/Left: Left Pain - part of body: Leg     Time: 1120 (Start time is approx)-1144 PT Time Calculation (min) (ACUTE ONLY): 24 min  Charges:  $Therapeutic Activity: 23-37 mins                    G Codes:       Van Clines, PT  Acute Rehabilitation Services Pager [phone] Office [phone]    Levi Aland 01/08/2017, 1:19 PM

## 2017-01-08 NOTE — Progress Notes (Addendum)
Patient ID: Ramy FlMarkhi Kleckner  DOB: [birth year], 66 y.o.   MRN: [id number]  This NP visited patient at the bedside as a follow up to last week's  GOCs meeting.  Patient shares with me that the doctors are telling me "there is not much more to do".  Addressed the courage of acceptance when medical interventions are limited.  Encouraged patient to consider DNR/DNI status knowing futile outcomes in similar patients.  He hopes to put a plan in place that includes going home with home care workers and family meeting his nursing needs and also going to work at least for a day or two.  I encouraged him to consider the logistics and the energy expenditure of his transition plan.  Discussed with CM-RN.  Questions and concerns addressed   PMT will continue to support holistically  Time in    0830      Time out    0905    Total time 35 minutes  Greater than 50% of the time was spent in counseling and coordination of care  Lorinda Creed NP  Palliative Medicine Team Team Phone # [phone] Pager [phone]

## 2017-01-08 NOTE — Progress Notes (Signed)
I met with pt and a friend at bedside. Pt not at a level for intense therapies at this time. He states he is aware. He will need SNF rehab at this time. We will sign off. RN CM and SW are aware. [phone]

## 2017-01-09 ENCOUNTER — Encounter (HOSPITAL_COMMUNITY): Payer: Self-pay | Admitting: General Surgery

## 2017-01-09 ENCOUNTER — Inpatient Hospital Stay (HOSPITAL_COMMUNITY): Payer: BC Managed Care – PPO

## 2017-01-09 DIAGNOSIS — R180 Malignant ascites: Secondary | ICD-10-CM

## 2017-01-09 HISTORY — PX: IR PARACENTESIS: IMG2679

## 2017-01-09 LAB — RENAL FUNCTION PANEL
Albumin: 2 g/dL — ABNORMAL LOW (ref 3.5–5.0)
Anion gap: 10 (ref 5–15)
BUN: 61 mg/dL — AB (ref 6–20)
CHLORIDE: 103 mmol/L (ref 101–111)
CO2: 19 mmol/L — AB (ref 22–32)
Calcium: 8.5 mg/dL — ABNORMAL LOW (ref 8.9–10.3)
Creatinine, Ser: 3.12 mg/dL — ABNORMAL HIGH (ref 0.61–1.24)
GFR calc non Af Amer: 19 mL/min — ABNORMAL LOW (ref >60)
GFR, EST AFRICAN AMERICAN: 22 mL/min — AB (ref >60)
Glucose, Bld: 157 mg/dL — ABNORMAL HIGH (ref 65–99)
POTASSIUM: 4.4 mmol/L (ref 3.5–5.1)
Phosphorus: 4.5 mg/dL (ref 2.5–4.6)
Sodium: 132 mmol/L — ABNORMAL LOW (ref 135–145)

## 2017-01-09 LAB — CBC
HEMATOCRIT: 32.1 % — AB (ref 39.0–52.0)
Hemoglobin: 10.9 g/dL — ABNORMAL LOW (ref 13.0–17.0)
MCH: 32.9 pg (ref 26.0–34.0)
MCHC: 34 g/dL (ref 30.0–36.0)
MCV: 97 fL (ref 78.0–100.0)
Platelets: 99 10*3/uL — ABNORMAL LOW (ref 150–400)
RBC: 3.31 MIL/uL — AB (ref 4.22–5.81)
RDW: 22.3 % — AB (ref 11.5–15.5)
WBC: 3.6 10*3/uL — AB (ref 4.0–10.5)

## 2017-01-09 LAB — GLUCOSE, CAPILLARY
GLUCOSE-CAPILLARY: 131 mg/dL — AB (ref 65–99)
Glucose-Capillary: 146 mg/dL — ABNORMAL HIGH (ref 65–99)
Glucose-Capillary: 151 mg/dL — ABNORMAL HIGH (ref 65–99)

## 2017-01-09 MED ORDER — LIDOCAINE HCL 1 % IJ SOLN
INTRAMUSCULAR | Status: AC
  Filled 2017-01-09: qty 20

## 2017-01-09 MED ORDER — ALBUMIN HUMAN 25 % IV SOLN
50.0000 g | Freq: Once | INTRAVENOUS | Status: AC
  Administered 2017-01-09: 50 g via INTRAVENOUS
  Filled 2017-01-09: qty 200

## 2017-01-09 MED ORDER — LIDOCAINE HCL (PF) 1 % IJ SOLN
INTRAMUSCULAR | Status: DC | PRN
  Administered 2017-01-09: 20 mL

## 2017-01-09 NOTE — Care Management Note (Signed)
Case Management Note  Patient Details  Name: Jeffery [name] MRN: [id number] Date of Birth: 12/15/1950  Subjective/Objective:      CM following for progression and d/c planning.              Action/Plan: 01/09/2017 Met with pt re hospice home services, pt selected [facility] and requested that that agency meet with him tomorrow afternoon.  This CM contacted [facility] and they will meet with this pt tomorrow 01/10/2017 @ 3pm to discuss HH services.   Expected Discharge Date:                  Expected Discharge Plan:  Home w Hospice Care  In-House Referral:  NA  Discharge planning Services  CM Consult  Post Acute Care Choice:  Hospice Choice offered to:  Patient  DME Arranged:  Hospital bed DME Agency:     HH Arranged:  RN, Nurse's Aide, Social Work HH Agency:  [facility]  Status of Service:  In process, will continue to follow  If discussed at Long Length of Stay Meetings, dates discussed:    Additional Comments:  ,  U, RN 01/09/2017, 4:32 PM  

## 2017-01-09 NOTE — Progress Notes (Signed)
Nutrition Follow-up  DOCUMENTATION CODES:   Severe malnutrition in context of acute illness/injury, Obesity unspecified  INTERVENTION:  -Encourage smaller, more frequent meals -Continue Prostat -Continue Ensure Enlive po TID, each supplement provides 350 kcal and 20 grams of protein   NUTRITION DIAGNOSIS:   Malnutrition (severe) related to acute illness as evidenced by energy intake < or equal to 50% for > or equal to 5 days, moderate depletion of body fat, moderate depletions of muscle mass.  Continues  GOAL:   Patient will meet greater than or equal to 90% of their needs  Not met  MONITOR:   PO intake, Supplement acceptance, Labs, Weight trends, Skin, I & O's  REASON FOR ASSESSMENT:   Consult Poor PO  ASSESSMENT:   66 y.o. male with medical history significant of retention, diabetes, end-stage liver disease, and was recently inpatient rehabilitation after amputation due to osteomyelitis in February. Patient was in inpatient rehabilitation when he began to bleed from his stump. Dr. Lurlean Leyden of hematology saw the patient and wanted the patient transferred to telemetry floor for FFP.   S/p paracentesis today with 2.4 L removed Pt does not feel well on visit today,appeared tired and frustrated. Pt reports he did not eat breakfast due to N/V. Pt did not eat lunch either.  Pt reports N/V yesterday as well Noted palliative care following  Labs: sodium 132 (L), Creatinine 3.12 Meds: reglan prn, MVI, xifaxan  Diet Order:  Diet 2 gram sodium Room service appropriate? Yes; Fluid consistency: Thin; Fluid restriction: 1200 mL Fluid  Skin:  Wound (see comment) (Stg 1 L hip, incision L leg)  Last BM:  4/18  Height:   Ht Readings from Last 1 Encounters:  01/08/17 _0  (1.854 m)    Weight:   Wt Readings from Last 1 Encounters:  01/08/17 242 lb 8.1 oz (110 kg)    Ideal Body Weight:  71.6 kg (adjusted for L BKA, R AKA)  BMI:  Body mass index is 31.99 kg/m.  Estimated  Nutritional Needs:   Kcal:  2200-2400  Protein:  115-135 grams  Fluid:  Per MD  EDUCATION NEEDS:   Education needs addressed  Kerman Passey MS, RD, LDN [phone] Pager  [phone] Weekend/On-Call Pager

## 2017-01-09 NOTE — Procedures (Signed)
Ultrasound-guided therapeutic paracentesis performed yielding 2.6 liters of serous colored fluid. No immediate complications.  Atharv Barriere E 2:24 PM 01/09/2017

## 2017-01-09 NOTE — Progress Notes (Signed)
Patient ID: Jeffery [name], male   DOB: December 10, 1950, 66 y.o.   MRN: [id number] . S:Feels better after paracentesis O:BP 114/70 (BP Location: Left Arm)   Pulse 80   Temp 98.7 F (37.1 C) (Axillary)   Resp 18   Ht  (1.854 m)   Wt 110 kg (242 lb 8.1 oz)   SpO2 98%   BMI 31.99 kg/m   Intake/Output Summary (Last 24 hours) at 01/09/17 1227 Last data filed at 01/09/17 1156  Gross per 24 hour  Intake              120 ml  Output              800 ml  Net             -680 ml   Intake/Output: I/O last 3 completed shifts: In: 180 [P.O.:180] Out: 1150 [Urine:1150]  Intake/Output this shift:  No intake/output data recorded. Weight change:  Gen:NAD CVS:no rub Resp:decreased BS at bases Abd:+distended, +fluid wave, +abdominal wall edema Ext:2+ edema/anasarca   Recent Labs Lab 01/04/17 0437 01/04/17 0729 01/05/17 0501 01/06/17 0652 01/07/17 0712 01/08/17 0613 01/09/17 0307  NA 130*  --  130* 129* 128* 131* 132*  K 4.2  --  4.7 4.6 4.5 5.0 4.4  CL 101  --  102 101 100* 103 103  CO2 21*  --  21* 20* 21* 22 19*  GLUCOSE 94  --  125* 113* 124* 95 157*  BUN 57*  --  57* 57* 57* 61* 61*  CREATININE 3.50*  --  3.35* 3.35* 3.27* 3.11* 3.12*  ALBUMIN  --  1.9* 1.8* 2.0* 2.0* 1.8* 2.0*  CALCIUM 8.5*  --  8.3* 8.4* 8.4* 8.5* 8.5*  PHOS  --   --   --   --  4.2  --  4.5  AST  --  37 33 32  --  36  --   ALT  --  13* 11* 10*  --  10*  --    Liver Function Tests:  Recent Labs Lab 01/05/17 0501 01/06/17 0652 01/07/17 0712 01/08/17 0613 01/09/17 0307  AST 33 32  --  36  --   ALT 11* 10*  --  10*  --   ALKPHOS 72 66  --  70  --   BILITOT 4.0* 3.0*  --  2.9*  --   PROT 7.4 7.3  --  7.1  --   ALBUMIN 1.8* 2.0* 2.0* 1.8* 2.0*   No results for input(s): LIPASE, AMYLASE in the last 168 hours. No results for input(s): AMMONIA in the last 168 hours. CBC:  Recent Labs Lab 01/05/17 0501 01/06/17 0652 01/07/17 0712 01/08/17 0613 01/09/17 0307  WBC 3.9* 3.5* 3.9* 3.5* 3.6*   NEUTROABS 2.3 1.8 2.2 2.0  --   HGB 9.1* 9.9* 9.8* 9.9* 10.9*  HCT 27.4* 29.9* 30.0* 30.2* 32.1*  MCV 93.2 94.6 95.5 95.9 97.0  PLT 73* 84* 99* 98* 99*   Cardiac Enzymes: No results for input(s): CKTOTAL, CKMB, CKMBINDEX, TROPONINI in the last 168 hours. CBG:  Recent Labs Lab 01/08/17 0807 01/08/17 1217 01/08/17 1657 01/08/17 2046 01/09/17 0751  GLUCAP 91 89 183* 203* 146*    Iron Studies: No results for input(s): IRON, TIBC, TRANSFERRIN, FERRITIN in the last 72 hours. Studies/Results: US Abdomen Limited  Result Date: 01/08/2017 CLINICAL DATA:  Assess for ascites EXAM: LIMITED ABDOMEN ULTRASOUND FOR ASCITES TECHNIQUE: Limited ultrasound survey for ascites was performed in all four abdominal quadrants.  COMPARISON:  None. FINDINGS: There is a large amount of ascites noted in all 4 quadrants of the abdomen/ pelvis. IMPRESSION: Large volume ascites. Electronically Signed   By: Charlett Nose M.D.   On: 01/08/2017 10:29   . feeding supplement (ENSURE ENLIVE)  237 mL Oral TID BM  . feeding supplement (PRO-STAT SUGAR FREE 64)  30 mL Oral Q1500  . insulin aspart  0-5 Units Subcutaneous QHS  . insulin aspart  0-9 Units Subcutaneous TID WC  . lidocaine      . mouth rinse  15 mL Mouth Rinse BID  . midodrine  15 mg Oral TID WC  . multivitamin with minerals  1 tablet Oral Daily  . octreotide  100 mcg Subcutaneous Q8H  . pantoprazole  40 mg Oral BID  . rifaximin  550 mg Oral BID  . saccharomyces boulardii  250 mg Oral BID  . sodium chloride flush  3 mL Intravenous Q12H    BMET    Component Value Date/Time   NA 132 (L) 01/09/2017 0307   NA 133 (L) 12/04/2016 1031   K 4.4 01/09/2017 0307   K 4.4 12/04/2016 1031   CL 103 01/09/2017 0307   CO2 19 (L) 01/09/2017 0307   CO2 22 12/04/2016 1031   GLUCOSE 157 (H) 01/09/2017 0307   GLUCOSE 140 12/04/2016 1031   BUN 61 (H) 01/09/2017 0307   BUN 31.8 (H) 12/04/2016 1031   CREATININE 3.12 (H) 01/09/2017 0307   CREATININE 1.6 (H)  12/04/2016 1031   CALCIUM 8.5 (L) 01/09/2017 0307   CALCIUM 8.3 (L) 12/04/2016 1031   GFRNONAA 19 (L) 01/09/2017 0307   GFRAA 22 (L) 01/09/2017 0307   CBC    Component Value Date/Time   WBC 3.6 (L) 01/09/2017 0307   RBC 3.31 (L) 01/09/2017 0307   HGB 10.9 (L) 01/09/2017 0307   HGB 10.2 (L) 12/04/2016 1030   HCT 32.1 (L) 01/09/2017 0307   HCT 30.5 (L) 12/04/2016 1030   PLT 99 (L) 01/09/2017 0307   PLT 115 (L) 12/04/2016 1030   MCV 97.0 01/09/2017 0307   MCV 100.7 (H) 12/04/2016 1030   MCH 32.9 01/09/2017 0307   MCHC 34.0 01/09/2017 0307   RDW 22.3 (H) 01/09/2017 0307   RDW 20.9 (H) 12/04/2016 1030   LYMPHSABS 0.7 01/08/2017 0613   LYMPHSABS 0.8 (L) 12/04/2016 1030   MONOABS 0.4 01/08/2017 0613   MONOABS 0.6 12/04/2016 1030   EOSABS 0.4 01/08/2017 0613   EOSABS 0.7 (H) 12/04/2016 1030   BASOSABS 0.0 01/08/2017 0613   BASOSABS 0.0 12/04/2016 1030     Assessment/Plan:  1. AKI/CKD stage 3- in setting of ABLA and low BP with possible hepatorenal syndrome.  He appears to be responding to midodrine/octreotide with small improvement of Scr.  He is not a dialysis or transplant candidate and would continue with conservative management for now.  Has diuresed some, however still with marked ascites and discomfort, although improved following paracentesis of 2.6l today. 2. ESLD- grade 1 varices and severe portal hypertensive gastropathy, diuretic resistant ascites.  Not a transplant candidate per GI. 3. Anasarca- started on IV lasix yesterday with some improvement.  Continue with IV lasix and plan for paracentesis tomorrow. 4. Anemia/thrombocytopenia- due to ESLD and CKD 5. DM- per primary 6. PVD s/p LKBA 7. Hyponatremia- due to anasarca 8. Disposition- poor overall prognosis.  Agree with palliative care consult to help set goals/limits of care as he is not a transplant candidate due to multiple co-morbidities and PVD.  He is adamant that he does not to go to a SNF and wants to go home.   I recommend home with hospice if he can arrange for a sitter during the day.     Irena Cords, MD [employer] [phone]

## 2017-01-09 NOTE — Progress Notes (Signed)
PROGRESS NOTE    Jeffery [name]  XBJ:[id number] DOB: October 03, 1950 DOA: 01/03/2017 PCP: Julian Hy, MD    Brief Narrative:  Patient is a 68 show gentleman history of diabetes, end-stage liver disease/cirrhosis, anemia, thrombocytopenia, status post recent left BKA secondary to osteomyelitis who was admitted from the inpatient rehabilitation when his stump began to go some blood and patient noted to be anemic and thrombocytopenic. Patient was seen by hematology/oncology were recommended that patient be admitted to the acute hospital for transfusion of FFP, packed red blood cells, management of decompensated cirrhosis and ongoing management.  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 Patient status post 2 units packed red blood cell transfusion 01/04/2017 with hemoglobin currently at 10.9 from 7.2. Goal hemoglobin greater than 8 per hematology. Hematology/oncology following.  #2 chronic thrombocytopenia Secondary to cirrhosis, splenomegaly per hematology oncology. Patient was transfused 2 units of FFP on 01/03/2017. Follow.  #3 recurrent ascites secondary to portal hypertension and decompensated liver failure/end-stage liver disease Patient noted to have third spacing due to  progressive liver disease. Patient noted to have a MELD score of 29. Patient status post ultrasound guided paracenteses from right lateral abdomen with 1.5 L maximum removed of clear yellow fluid per interventional radiology on 01/03/2017 prior to transfer from inpatient rehabilitation. Patient nephrology and GI, patient deemed not a transplant candidate. Continue PPI, Xifaxan. Patient noted to have complaints of shortness of breath and patient feels likely a ascites pushing on his diaphragm. Abdominal ultrasound obtained 01/08/2017 consistent with large ascites. Ultrasound-guided paracentesis on 01/09/2017 with 2.6 L removed. Patient was to receive a dose of albumin with paracentesis. Continue IV Lasix 80 mg daily per nephrology.   #4 coagulopathy Secondary to end-stage liver disease/cirrhosis. Patient status post 2 units FFP 01/03/2017. Due to significant synthetic liver dysfunction, it is felt per hematology the patient likely will not benefit from vitamin K. Goal INR per hematology INR close to 1.5 and below. Patient noted to have a very low fibrinogen level and as such 2units of cryoprecipitate was ordered per hematology. Per hematology goal INR less than 2 and fibrinogen greater than 150. If INR greater than 2 hematology recommended transfusion of 2 units of FFP. Per hematology/oncology.  #5 acute on chronic kidney disease stage III Likely secondary to prerenal azotemia in the setting of anemia and hemodynamic instability and probable hepatorenal syndrome. Patient with worsening renal function initially which seemed to be plateauing. Patient currently on midodrine. Patient has been started back octreotide per nephrology. Patient s/p transfusion of 2 units packed red blood cells. Concern for hepatorenal syndrome. Continue Lasix 80 mg IV daily per recommendations of nephrology. Nephrology following.  #6 peripheral vascular disease status post left BKA 12/23/2016 per Dr. Ophelia Charter   stump site noted to be  bleeding prior to admission.  Patient status post 2 units FFP. Patient s/p 2 units packed red blood cells. Spoke with Dr. Ophelia Charter and patient dressing changes of: Betadine swab to incision at time of dressing change and dry dressing changes daily with Ace wrap.  #7 diabetes mellitus CBGs have ranged from 91-183. Hemoglobin A1c was 5.8 on 10/24/2016. Will D/C CBG checks. Discontinue sliding scale insulin.   #8 severe protein calorie malnutrition/ severe cachexia Dietitian following.  #9 Hyperuricemia Patient has been ordered a dose of rasburicase per hematology/oncology.  #10 hyponatremia Likely secondary to volume overload. Patient did have some complaints of shortness of breath over the past few nights. Hyponatremia improved with diuresis. Continue IV Lasix 80 mg daily.    #11 prognosis Patient with a poor prognosis. Patient with cirrhosis of the liver with decompensation requiring paracenteses, anemic and thrombocytopenic with a coagulopathy status post 2 units FFP and being transfused 2 units packed red blood cells. Patient debilitated. Patient status post recent left BKA with recent labs in no blood after some prophylactic heparin. Patient with a meld score of 29. Patient also with severe protein calorie malnutrition. Patient deemed not a transplant candidate per GI and nephrology. Palliative care following.     DVT prophylaxis: Patient with right AKA, recent left BKA, anemic and thrombocytopenic. Code Status: Full Family Communication: Updated patient and friend at bedside. Disposition Plan: Pending hospitalization and pending palliative care consultation and other consultants, likely home with palliative or hospice..   Consultants:   Hematology/oncology Dr.Gorsuch   Gastroenterology Dr.Danis III 01/04/2017  Palliative care Dr. Linna Darner 01/04/2017  Nephrology Dr. Signe Colt  Procedures:   2 units FFP 01/03/2017  2 units packed red blood cells pending 01/04/2017  Transfusion of  cryoprecipitate 01/05/2017  Abdominal US 01/08/2017  Ultrasound-guided paracentesis 01/09/2017--- 2.6 L removed  Antimicrobials:   None   Subjective: Patient laying in bed. Patient states was not a significantly short of breath last night. No chest pain. No significant abdominal pain.   Objective: Vitals:   01/09/17 0600 01/09/17 0811 01/09/17 1248 01/09/17 1302  BP: 107/66 114/70 125/85 132/73  Pulse: 79 80  67  Resp: 18 18    Temp: 97.7 F (36.5 C) 98.7 F (37.1 C)    TempSrc:  Axillary    SpO2: 99% 98%    Weight:      Height:        Intake/Output Summary (Last 24 hours) at 01/09/17 1411 Last data filed at 01/09/17 1401  Gross per 24 hour  Intake              120 ml  Output             3400 ml  Net            -3280 ml   Filed Weights   01/05/17 2215 01/06/17 2138 01/08/17 0453  Weight: 112.1 kg (247 lb 3.2 oz) 110 kg (242 lb 8.1 oz) 110 kg (242 lb 8.1 oz)    Examination:  General exam: Appears calm and comfortable, Pallor.  Respiratory system: Clear to auscultation anterior lung fields. Decreased breath sounds in the bases. Respiratory effort normal. Cardiovascular system: S1 & S2 heard, RRR. No JVD, murmurs, rubs, gallops or clicks. No pedal edema. Gastrointestinal system: Abdomen is distended, soft and less tenderness to palpation in the right upper quadrant. Hepatomegaly/Splenomegaly. Ascites. Normal bowel sounds heard. Central nervous system: Alert and oriented. No focal neurological deficits. Extremities: left BKA with stump dressing. Right AKA  Skin: No rashes, lesions or ulcers  Psychiatry: Judgement and insight appear normal. Mood & affect appropriate.     Data Reviewed: I have personally reviewed following labs and imaging studies  CBC:  Recent Labs Lab 01/05/17 0501 01/06/17 0652 01/07/17 0712 01/08/17 0613 01/09/17 0307  WBC 3.9* 3.5* 3.9* 3.5* 3.6*  NEUTROABS 2.3 1.8 2.2 2.0  --   HGB 9.1* 9.9* 9.8* 9.9* 10.9*  HCT 27.4* 29.9* 30.0*  30.2* 32.1*  MCV 93.2 94.6 95.5 95.9 97.0  PLT 73* 84* 99* 98* 99*   Basic Metabolic Panel:  Recent Labs Lab 01/05/17 0501 01/06/17 0652 01/07/17 0712 01/08/17 0613 01/09/17 0307  NA 130* 129* 128* 131* 132*  K 4.7 4.6 4.5 5.0 4.4  CL 102 101 100* 103 103  CO2 21* 20* 21* 22 19*  GLUCOSE 125* 113* 124* 95 157*  BUN 57* 57* 57* 61* 61*  CREATININE 3.35* 3.35* 3.27* 3.11* 3.12*  CALCIUM 8.3* 8.4* 8.4* 8.5* 8.5*  PHOS  --   --  4.2  --  4.5   GFR: Estimated Creatinine Clearance: 30.3 mL/min (A) (by C-G formula based on SCr of 3.12 mg/dL (H)). Liver Function Tests:  Recent Labs Lab 01/04/17 0729 01/05/17 0501 01/06/17 0652 01/07/17 0712 01/08/17 0613 01/09/17 0307  AST 37 33 32  --  36  --   ALT 13* 11* 10*  --  10*  --   ALKPHOS 70 72 66  --  70  --   BILITOT 2.9* 4.0* 3.0*  --  2.9*  --   PROT 7.3 7.4 7.3  --  7.1  --   ALBUMIN 1.9* 1.8* 2.0* 2.0* 1.8* 2.0*   No results for input(s): LIPASE, AMYLASE in the last 168 hours. No results for input(s): AMMONIA in the last 168 hours. Coagulation Profile:  Recent Labs Lab 01/03/17 0628 01/05/17 0501 01/06/17 0652 01/07/17 0712 01/08/17 0613  INR 2.20 1.93 1.59 1.59 1.73   Cardiac Enzymes: No results for input(s): CKTOTAL, CKMB, CKMBINDEX, TROPONINI in the last 168 hours. BNP (last 3 results) No results for input(s): PROBNP in the last 8760 hours. HbA1C: No results for input(s): HGBA1C in the last 72 hours. CBG:  Recent Labs Lab 01/08/17 1217 01/08/17 1657 01/08/17 2046 01/09/17 0751 01/09/17 1338  GLUCAP 89 183* 203* 146* 131*   Lipid Profile: No results for input(s): CHOL, HDL, LDLCALC, TRIG, CHOLHDL, LDLDIRECT in the last 72 hours. Thyroid Function Tests: No results for input(s): TSH, T4TOTAL, FREET4, T3FREE, THYROIDAB in the last 72 hours. Anemia Panel: No results for input(s): VITAMINB12, FOLATE, FERRITIN, TIBC, IRON, RETICCTPCT in the last 72 hours. Sepsis Labs: No results for input(s):  PROCALCITON, LATICACIDVEN in the last 168 hours.  Recent Results (from the past 240 hour(s))  Culture, Urine     Status: None   Collection Time: 12/31/16  2:41 PM  Result Value Ref Range Status   Specimen Description URINE, CATHETERIZED  Final   Special Requests NONE  Final   Culture NO GROWTH  Final   Report Status 01/01/2017 FINAL  Final         Radiology Studies: US Abdomen Limited  Result Date: 01/08/2017 CLINICAL DATA:  Assess for ascites EXAM: LIMITED ABDOMEN ULTRASOUND FOR ASCITES TECHNIQUE: Limited ultrasound survey for ascites was performed in all four abdominal quadrants. COMPARISON:  None. FINDINGS: There is a large amount of ascites noted in all 4 quadrants of the abdomen/ pelvis. IMPRESSION: Large volume ascites. Electronically Signed   By: Charlett Nose M.D.   On: 01/08/2017 10:29  Scheduled Meds: . feeding supplement (ENSURE ENLIVE)  237 mL Oral TID BM  . feeding supplement (PRO-STAT SUGAR FREE 64)  30 mL Oral Q1500  . insulin aspart  0-5 Units Subcutaneous QHS  . insulin aspart  0-9 Units Subcutaneous TID WC  . lidocaine      . lidocaine      . mouth rinse  15 mL Mouth Rinse BID  . midodrine  15 mg Oral TID WC  . multivitamin with minerals  1 tablet Oral Daily  . octreotide  100 mcg Subcutaneous Q8H  . pantoprazole  40 mg Oral BID  . rifaximin  550 mg Oral BID  . saccharomyces boulardii  250 mg Oral BID  . sodium chloride flush  3 mL Intravenous Q12H   Continuous Infusions: . sodium chloride    . sodium chloride       LOS: 6 days    Time spent: 35 mins    Ingris Pasquarella, MD [facility] Pager [phone]  If 7PM-7AM, please contact night-coverage [email or web address] Password [email or web address] 01/09/2017, 2:11 PM

## 2017-01-09 NOTE — Progress Notes (Signed)
Patient ID: Jeffery [name], male   DOB: 12/09/1950, 66 y.o.   MRN: [id number]  This NP visited patient at the bedside with patient and his friend [name] at their request.  Continued conversation to discuss diagnosis, prognosis, GOC, EOL wishes disposition and options.     Concepts specific to code status, artifical feeding and hydration, continued IV antibiotics and rehospitalization was had.  MOST form introduced  The difference between a aggressive medical intervention path  and a palliative comfort care path for this patient at this time was had.  Values and goals of care important to patient and family were attempted to be elicited.  He hopes to put a plan in place that includes going home with home care workers and family meeting his nursing needs and also going to work at least for a day or two.  He is hopeful for hospice services  I shared with him my concerns that this will be a difficult endeavor. I encouraged him to consider the logistics and the energy expenditure of his transition plan.  Natural trajectory and expectations at EOL were discussed.  Questions and concerns addressed.   Family encouraged to call with questions or concerns.  PMT will continue to support holistically.  Encouraged patient to consider DNR/DNI status knowing futile outcomes in similar patients.  Prognosis is likely weeks to months  Questions and concerns addressed   PMT will continue to support holistically  Time in   1200   Time out  1245      Total time 45 minutes  Greater than 50% of the time was spent in counseling and coordination of care  Discussed with Dr Janee Morn and Dr Lorie Phenix  Lorinda Creed NP  Palliative Medicine Team Team Phone # [phone] Pager [phone]

## 2017-01-10 LAB — RENAL FUNCTION PANEL
ALBUMIN: 2.1 g/dL — AB (ref 3.5–5.0)
Anion gap: 6 (ref 5–15)
BUN: 59 mg/dL — AB (ref 6–20)
CHLORIDE: 105 mmol/L (ref 101–111)
CO2: 21 mmol/L — AB (ref 22–32)
Calcium: 8.2 mg/dL — ABNORMAL LOW (ref 8.9–10.3)
Creatinine, Ser: 3.23 mg/dL — ABNORMAL HIGH (ref 0.61–1.24)
GFR calc Af Amer: 21 mL/min — ABNORMAL LOW (ref >60)
GFR calc non Af Amer: 19 mL/min — ABNORMAL LOW (ref >60)
GLUCOSE: 151 mg/dL — AB (ref 65–99)
POTASSIUM: 3.8 mmol/L (ref 3.5–5.1)
Phosphorus: 4.4 mg/dL (ref 2.5–4.6)
Sodium: 132 mmol/L — ABNORMAL LOW (ref 135–145)

## 2017-01-10 LAB — CBC WITH DIFFERENTIAL/PLATELET
BASOS PCT: 0 %
Basophils Absolute: 0 10*3/uL (ref 0.0–0.1)
EOS PCT: 8 %
Eosinophils Absolute: 0.3 10*3/uL (ref 0.0–0.7)
HEMATOCRIT: 28.5 % — AB (ref 39.0–52.0)
Hemoglobin: 9.7 g/dL — ABNORMAL LOW (ref 13.0–17.0)
LYMPHS ABS: 0.9 10*3/uL (ref 0.7–4.0)
LYMPHS PCT: 26 %
MCH: 33.4 pg (ref 26.0–34.0)
MCHC: 34 g/dL (ref 30.0–36.0)
MCV: 98.3 fL (ref 78.0–100.0)
Monocytes Absolute: 0.4 10*3/uL (ref 0.1–1.0)
Monocytes Relative: 11 %
NEUTROS ABS: 1.7 10*3/uL (ref 1.7–7.7)
Neutrophils Relative %: 55 %
PLATELETS: 80 10*3/uL — AB (ref 150–400)
RBC: 2.9 MIL/uL — ABNORMAL LOW (ref 4.22–5.81)
RDW: 22.3 % — AB (ref 11.5–15.5)
WBC: 3.3 10*3/uL — ABNORMAL LOW (ref 4.0–10.5)

## 2017-01-10 LAB — GLUCOSE, CAPILLARY
Glucose-Capillary: 180 mg/dL — ABNORMAL HIGH (ref 65–99)
Glucose-Capillary: 151 mg/dL — ABNORMAL HIGH (ref 65–99)
Glucose-Capillary: 189 mg/dL — ABNORMAL HIGH (ref 65–99)

## 2017-01-10 NOTE — Progress Notes (Signed)
PROGRESS NOTE    Jeffery [name]  XBJ:[id number] DOB: 1951-06-30 DOA: 01/03/2017 PCP: Julian Hy, MD    Brief Narrative:  Patient is a 44 show gentleman history of diabetes, end-stage liver disease/cirrhosis, anemia, thrombocytopenia, status post recent left BKA secondary to osteomyelitis who was admitted from the inpatient rehabilitation when his stump began to go some blood and patient noted to be anemic and thrombocytopenic. Patient was seen by hematology/oncology were recommended that patient be admitted to the acute hospital for transfusion of FFP, packed red blood cells, management of decompensated cirrhosis and ongoing management.  Patient being followed by nephrology due to probable hepatorenal syndrome. Patient's bleeding seems to have stabilized. Patient with recurrent ascites status post paracentesis with 2.6 L removed with some symptomatic improvement. Palliative care consultation obtained.  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 Patient status post 2 units packed red blood cell transfusion 01/04/2017 with hemoglobin currently at 9.7 from 7.2. Goal hemoglobin greater than 8 per hematology. Hematology/oncology following.  #2 chronic thrombocytopenia Secondary to  cirrhosis, splenomegaly per hematology oncology. Patient was transfused 2 units of FFP on 01/03/2017. Follow.  #3 recurrent ascites secondary to portal hypertension and decompensated liver failure/end-stage liver disease Patient noted to have third spacing due to progressive liver disease. Patient noted to have a MELD score of 29. Patient status post ultrasound guided paracenteses from right lateral abdomen with 1.5 L maximum removed of clear yellow fluid per interventional radiology on 01/03/2017 prior to transfer from inpatient rehabilitation. Patient nephrology and GI, patient deemed not a transplant candidate. Continue PPI, Xifaxan. Patient noted to have complaints of shortness of breath and patient feels likely a ascites pushing on his diaphragm. Abdominal ultrasound obtained 01/08/2017 consistent with large ascites. Ultrasound-guided paracentesis on 01/09/2017 with 2.6 L removed. Patient was to receive a dose of albumin with paracentesis. Symptomatic improvement. Continue IV Lasix 80 mg daily per nephrology.   #4 coagulopathy Secondary to end-stage liver disease/cirrhosis. Patient status post 2 units FFP 01/03/2017. Due to significant synthetic liver dysfunction, it is felt per hematology the patient likely will not benefit from vitamin K. Goal INR per hematology INR close to 1.5 and below. Patient noted to have a very low fibrinogen level and as such 2units of cryoprecipitate was ordered per hematology. Per hematology goal INR less than 2 and fibrinogen greater than 150. If INR greater than 2 hematology recommended transfusion of 2 units of FFP. Repeat INR tomorrow. Per hematology/oncology.  #5 acute on chronic kidney disease stage III Likely secondary to prerenal azotemia in the setting of anemia and hemodynamic instability and probable hepatorenal syndrome. Patient with worsening renal function initially which seemed to be plateauing. Patient currently on midodrine. Patient has been started back  octreotide per nephrology. Patient s/p transfusion of 2 units packed red blood  cells. Concern for hepatorenal syndrome. Continue Lasix 80 mg IV daily per recommendations of nephrology. Nephrology following.  #6 peripheral vascular disease status post left BKA 12/23/2016 per Dr. Ophelia Charter   stump site noted to be bleeding prior to admission. Patient status post 2 units FFP. Patient s/p 2 units packed red blood cells. Spoke with Dr. Ophelia Charter and patient dressing changes of: Betadine swab to incision at time of dressing change and dry dressing changes daily with Ace wrap.  #7 diabetes mellitus CBGs have ranged from 151-180. Hemoglobin A1c was 5.8 on 10/24/2016. D/C CBG checks. Discontinued sliding scale insulin.   #8 severe protein calorie malnutrition/ severe cachexia Dietitian following.  #9 Hyperuricemia Patient has been ordered a dose of rasburicase per hematology/oncology.  #10 hyponatremia Likely secondary to volume overload. Patient did have some complaints of shortness of breath over the past few nights. Hyponatremia improved with diuresis. Continue IV Lasix 80 mg daily.    #11 prognosis Patient with a poor prognosis. Patient with cirrhosis of the liver with decompensation requiring paracenteses, anemic and thrombocytopenic with a coagulopathy status post 2 units FFP and being transfused 2 units packed red blood cells. Patient debilitated. Patient status post recent left BKA with recent labs in no blood after some prophylactic heparin. Patient with a meld score of 29. Patient also with severe protein calorie malnutrition. Patient deemed not a transplant candidate per GI and nephrology. Palliative care following. Hospice has been consulted.    DVT prophylaxis: Patient with right AKA, recent left BKA, anemic and thrombocytopenic. Code Status: Full Family Communication: Updated patient. No family at bedside.  Disposition Plan: Pending hospitalization and pending palliative care consultation and  other consultants, likely home with palliative or hospice..   Consultants:   Hematology/oncology Dr.Gorsuch   Gastroenterology Dr.Danis III 01/04/2017  Palliative care Dr. Linna Darner 01/04/2017  Nephrology Dr. Signe Colt  Procedures:   2 units FFP 01/03/2017  2 units packed red blood cells pending 01/04/2017  Transfusion of cryoprecipitate 01/05/2017  Abdominal US 01/08/2017  Ultrasound-guided paracentesis 01/09/2017--- 2.6 L removed  Antimicrobials:   None   Subjective: Patient laying in bed. Patient states slept well overnight. Patient denies any chest pain. No significant abdominal pain. Patient states less bleeding from stump site. Patient states he ate a steak from [employer] last night which was very delicious.  Objective: Vitals:   01/09/17 1648 01/09/17 2235 01/10/17 0628 01/10/17 0810  BP: 122/60 104/62 124/63 120/64  Pulse: 69 80 83 84  Resp: Temp: 97.8 F (36.6 C) 97.9 F (36.6 C) 97.6 F (36.4 C) 98 F (36.7 C)  TempSrc: Oral   Oral  SpO2: 100% 100% 98% 98%  Weight:  108.4 kg (239 lb)    Height:        Intake/Output Summary (Last 24 hours) at 01/10/17 0953 Last data filed at 01/10/17 0629  Gross per 24 hour  Intake              120 ml  Output             3175 ml  Net            -3055 ml   Filed Weights   01/06/17 2138 01/08/17 0453 01/09/17 2235  Weight: 110 kg (242 lb 8.1 oz) 110 kg (242 lb 8.1 oz) 108.4 kg (239 lb)    Examination:  General exam: Appears calm and comfortable, Pallor.  Respiratory system: Clear to auscultation anterior lung fields. Decreased breath sounds in the  bases. Respiratory effort normal. Cardiovascular system: S1 & S2 heard, RRR. No JVD, murmurs, rubs, gallops or clicks. No pedal edema. Gastrointestinal system: Abdomen is distended, soft and less tenderness to palpation in the right upper quadrant. Hepatomegaly/Splenomegaly. Ascites. Normal bowel sounds heard. Central nervous system: Alert and oriented. No  focal neurological deficits. Extremities: left BKA with stump dressing on. Right AKA  Skin: No rashes, lesions or ulcers Psychiatry: Judgement and insight appear normal. Mood & affect appropriate.     Data Reviewed: I have personally reviewed following labs and imaging studies  CBC:  Recent Labs Lab 01/05/17 0501 01/06/17 1610 01/07/17 0712 01/08/17 0613 01/09/17 0307 01/10/17 0637  WBC 3.9* 3.5* 3.9* 3.5* 3.6* 3.3*  NEUTROABS 2.3 1.8 2.2 2.0  --  1.7  HGB 9.1* 9.9* 9.8* 9.9* 10.9* 9.7*  HCT 27.4* 29.9* 30.0* 30.2* 32.1* 28.5*  MCV 93.2 94.6 95.5 95.9 97.0 98.3  PLT 73* 84* 99* 98* 99* 80*   Basic Metabolic Panel:  Recent Labs Lab 01/06/17 0652 01/07/17 0712 01/08/17 0613 01/09/17 0307 01/10/17 0637  NA 129* 128* 131* 132* 132*  K 4.6 4.5 5.0 4.4 3.8  CL 101 100* 103 103 105  CO2 20* 21* 22 19* 21*  GLUCOSE 113* 124* 95 157* 151*  BUN 57* 57* 61* 61* 59*  CREATININE 3.35* 3.27* 3.11* 3.12* 3.23*  CALCIUM 8.4* 8.4* 8.5* 8.5* 8.2*  PHOS  --  4.2  --  4.5 4.4   GFR: Estimated Creatinine Clearance: 29.1 mL/min (A) (by C-G formula based on SCr of 3.23 mg/dL (H)). Liver Function Tests:  Recent Labs Lab 01/04/17 0729 01/05/17 0501 01/06/17 0652 01/07/17 0712 01/08/17 0613 01/09/17 0307 01/10/17 0637  AST 37 33 32  --  36  --   --   ALT 13* 11* 10*  --  10*  --   --   ALKPHOS 70 72 66  --  70  --   --   BILITOT 2.9* 4.0* 3.0*  --  2.9*  --   --   PROT 7.3 7.4 7.3  --  7.1  --   --   ALBUMIN 1.9* 1.8* 2.0* 2.0* 1.8* 2.0* 2.1*   No results for input(s): LIPASE, AMYLASE in the last 168 hours. No results for input(s): AMMONIA in the last 168 hours. Coagulation Profile:  Recent Labs Lab 01/05/17 0501 01/06/17 0652 01/07/17 0712 01/08/17 0613  INR 1.93 1.59 1.59 1.73   Cardiac Enzymes: No results for input(s): CKTOTAL, CKMB, CKMBINDEX, TROPONINI in the last 168 hours. BNP (last 3 results) No results for input(s): PROBNP in the last 8760  hours. HbA1C: No results for input(s): HGBA1C in the last 72 hours. CBG:  Recent Labs Lab 01/08/17 2046 01/09/17 0751 01/09/17 1338 01/09/17 1709 01/10/17 0809  GLUCAP 203* 146* 131* 151* 180*   Lipid Profile: No results for input(s): CHOL, HDL, LDLCALC, TRIG, CHOLHDL, LDLDIRECT in the last 72 hours. Thyroid Function Tests: No results for input(s): TSH, T4TOTAL, FREET4, T3FREE, THYROIDAB in the last 72 hours. Anemia Panel: No results for input(s): VITAMINB12, FOLATE, FERRITIN, TIBC, IRON, RETICCTPCT in the last 72 hours. Sepsis Labs: No results for input(s): PROCALCITON, LATICACIDVEN in the last 168 hours.  Recent Results (from the past 240 hour(s))  Culture, Urine     Status: None   Collection Time: 12/31/16  2:41 PM  Result Value Ref Range Status   Specimen Description URINE, CATHETERIZED  Final   Special Requests NONE  Final   Culture NO GROWTH  Final  Report Status 01/01/2017 FINAL  Final         Radiology Studies: Ir Paracentesis  Result Date: 01/09/2017 INDICATION: Cryptogenic cirrhosis with recurrent abdominal ascites and renal insufficiency. Request is made for therapeutic paracentesis with a 3 L maximum. EXAM: ULTRASOUND GUIDED THERAPEUTIC PARACENTESIS MEDICATIONS: 1% lidocaine COMPLICATIONS: None immediate. PROCEDURE: Informed written consent was obtained from the patient after a discussion of the risks, benefits and alternatives to treatment. A timeout was performed prior to the initiation of the procedure. Initial ultrasound scanning demonstrates a moderate amount of ascites within the right lower abdominal quadrant. The right lower abdomen was prepped and draped in the usual sterile fashion. 1% lidocaine was used for local anesthesia. Following this, a 19 gauge, 10-cm, Yueh catheter was introduced. An ultrasound image was saved for documentation purposes. The paracentesis was performed. The catheter was removed and a dressing was applied. The patient tolerated  the procedure well without immediate post procedural complication. FINDINGS: A total of approximately 2.6 L of serous fluid was removed. IMPRESSION: Successful ultrasound-guided paracentesis yielding 2.6 liters of peritoneal fluid. Read by: Barnetta Chapel, PA-C Electronically Signed   By: Gilmer Mor D.O.   On: 01/09/2017 14:23        Scheduled Meds: . feeding supplement (ENSURE ENLIVE)  237 mL Oral TID BM  . feeding supplement (PRO-STAT SUGAR FREE 64)  30 mL Oral Q1500  . mouth rinse  15 mL Mouth Rinse BID  . midodrine  15 mg Oral TID WC  . multivitamin with minerals  1 tablet Oral Daily  . octreotide  100 mcg Subcutaneous Q8H  . pantoprazole  40 mg Oral BID  . rifaximin  550 mg Oral BID  . saccharomyces boulardii  250 mg Oral BID  . sodium chloride flush  3 mL Intravenous Q12H   Continuous Infusions: . sodium chloride    . sodium chloride       LOS: 7 days    Time spent: 35 mins    Meyer Arora, MD [facility] Pager [phone]  If 7PM-7AM, please contact night-coverage [email or web address] Password [facility] 01/10/2017, 9:53 AM

## 2017-01-10 NOTE — Progress Notes (Signed)
Patient refused 1000 am medications, states " I just don't feel like taking them today.

## 2017-01-10 NOTE — Progress Notes (Signed)
Physical Therapy Treatment Patient Details Name: Jeffery [name] MRN: [id number] DOB: Jul 30, 1951 Today's Date: 01/10/2017    History of Present Illness Pt is a 66 yo male admitted from CIR on 01/03/17 with thrombocytopenia and coagulopathy. PMH significant for cirrhosis, osteomyelitis, DM2, depression, encephalopathy, R AKA and L BKA.     PT Comments    Focus of PT session was talking through options for mobility given current physical status, with the clearly stated goal of getting home; He tells me he will be alone a lot, but that he has lots of help available to him, whenever he needs it; I tasked him with developing a workable daily schedule, so that he can have help with the mobility tasks when he needs it the most;  Ultimately he declined mobility work, telling me he is exhausted and has a lot on his mind;   For home, will recommend:  Hospital bed, air overlay, rolling table  Mechanical lift  Non-emergent ambulance transport home  HHPT/OT/RN/Aide  Of course, continuing Hospice Services    Follow Up Recommendations  Home health PT;Supervision - Intermittent; HHOT/RN/Aide     Equipment Recommendations  Hospital bed;Other (comment) (See above)    Recommendations for Other Services       Precautions / Restrictions Precautions Precautions: Fall Restrictions Weight Bearing Restrictions: Yes RLE Weight Bearing: Non weight bearing (RT AKA) LLE Weight Bearing: Non weight bearing (LT AKA)    Mobility  Bed Mobility                  Transfers                    Ambulation/Gait                 Stairs            Wheelchair Mobility    Modified Rankin (Stroke Patients Only)       Balance                                            Cognition Arousal/Alertness: Awake/alert Behavior During Therapy: WFL for tasks assessed/performed Overall Cognitive Status: Within Functional Limits for tasks assessed                                         Exercises      General Comments        Pertinent Vitals/Pain Pain Assessment: No/denies pain    Home Living                      Prior Function            PT Goals (current goals can now be found in the care plan section) Acute Rehab PT Goals Patient Stated Goal: to get back to work PT Goal Formulation: With patient Time For Goal Achievement: 01/20/17 Potential to Achieve Goals: Fair Progress towards PT goals: Progressing toward goals (problem-solving towards goal of getting home)    Frequency    Min 3X/week      PT Plan Discharge plan needs to be updated    Co-evaluation             End of Session   Activity Tolerance: Patient limited by fatigue;Other (comment) (declining mobility, a lot  on his mind) Patient left: in bed;with call bell/phone within reach   PT Visit Diagnosis: Pain;Muscle weakness (generalized) (M62.81) Pain - Right/Left: Left Pain - part of body: Leg     Time: 1440-1455 PT Time Calculation (min) (ACUTE ONLY): 15 min  Charges:  $Self Care/Home Management: 8-22                    G Codes:       Van Clines, PT  Acute Rehabilitation Services Pager [phone] Office [phone]    Levi Aland 01/10/2017, 5:02 PM

## 2017-01-10 NOTE — Progress Notes (Signed)
[facility])  Received request from [facility] for patient interest in [facility] services at home following discharge from hospital. Chart and patient information reviewed and eligibility confirmed by [facility] physician.    Met with patient x2 today to confirm interest, explain services including hospice philosophy and team approach to care. Patient verbalized good understanding and strong desire to return to home. He confirmed plan to transfer home via [facility] based on PT recommendations. At this time patient and RNCM are uncertain of discharge date.   DME needs discussed. Patient is requesting hospital bed with split rails, overbed table and hoyer lift per PT recommendation. Spoke with [facility] Ben to confirm he is contact for [facility] to coordinate DME delivery to home. Ben's phone number is [phone]. Confirmed with patient discharge address is different than address in EPIC. Patient plans to discharge to his residence at [address], [place], [place]. DME to be ordered 01/11/17. [facility] referral center is aware of above.   Please send prescriptions for any medication patient does not already have including comfort medications.  Please fax discharge summary to [facility] at [phone]. Please notify [facility] when patient is ready to leave unit at discharge. Call [phone] before 5 pm and [phone] after 5 pm.  [facility] will continue to follow daily until discharge.   Please do not hesitate to call with questions.  Thank you,  Erling Conte, LCSW [phone]

## 2017-01-11 DIAGNOSIS — M79604 Pain in right leg: Secondary | ICD-10-CM

## 2017-01-11 LAB — PROTIME-INR
INR: 1.88
PROTHROMBIN TIME: 21.9 s — AB (ref 11.4–15.2)

## 2017-01-11 LAB — RENAL FUNCTION PANEL
ALBUMIN: 2 g/dL — AB (ref 3.5–5.0)
ANION GAP: 8 (ref 5–15)
BUN: 58 mg/dL — ABNORMAL HIGH (ref 6–20)
CALCIUM: 8.4 mg/dL — AB (ref 8.9–10.3)
CO2: 19 mmol/L — ABNORMAL LOW (ref 22–32)
Chloride: 105 mmol/L (ref 101–111)
Creatinine, Ser: 3.08 mg/dL — ABNORMAL HIGH (ref 0.61–1.24)
GFR calc Af Amer: 23 mL/min — ABNORMAL LOW (ref >60)
GFR, EST NON AFRICAN AMERICAN: 20 mL/min — AB (ref >60)
GLUCOSE: 151 mg/dL — AB (ref 65–99)
PHOSPHORUS: 4.6 mg/dL (ref 2.5–4.6)
POTASSIUM: 4 mmol/L (ref 3.5–5.1)
SODIUM: 132 mmol/L — AB (ref 135–145)

## 2017-01-11 LAB — CBC WITH DIFFERENTIAL/PLATELET
BASOS ABS: 0 10*3/uL (ref 0.0–0.1)
Basophils Relative: 0 %
EOS ABS: 0.3 10*3/uL (ref 0.0–0.7)
Eosinophils Relative: 8 %
HCT: 28.1 % — ABNORMAL LOW (ref 39.0–52.0)
HEMOGLOBIN: 9.5 g/dL — AB (ref 13.0–17.0)
LYMPHS ABS: 0.9 10*3/uL (ref 0.7–4.0)
LYMPHS PCT: 25 %
MCH: 33.1 pg (ref 26.0–34.0)
MCHC: 33.8 g/dL (ref 30.0–36.0)
MCV: 97.9 fL (ref 78.0–100.0)
MONOS PCT: 9 %
Monocytes Absolute: 0.3 10*3/uL (ref 0.1–1.0)
NEUTROS ABS: 2 10*3/uL (ref 1.7–7.7)
Neutrophils Relative %: 58 %
Platelets: 83 10*3/uL — ABNORMAL LOW (ref 150–400)
RBC: 2.87 MIL/uL — AB (ref 4.22–5.81)
RDW: 22.4 % — AB (ref 11.5–15.5)
WBC: 3.5 10*3/uL — AB (ref 4.0–10.5)

## 2017-01-11 MED ORDER — OXYCODONE HCL 5 MG PO TABS
10.0000 mg | ORAL_TABLET | ORAL | Status: DC | PRN
  Administered 2017-01-11: 10 mg via ORAL
  Filled 2017-01-11 (×2): qty 2

## 2017-01-11 MED ORDER — OXYCODONE HCL 5 MG PO TABS
5.0000 mg | ORAL_TABLET | Freq: Once | ORAL | Status: AC
  Administered 2017-01-11: 5 mg via ORAL

## 2017-01-11 MED ORDER — CYCLOBENZAPRINE HCL 5 MG PO TABS
5.0000 mg | ORAL_TABLET | Freq: Two times a day (BID) | ORAL | Status: DC
  Administered 2017-01-11 – 2017-01-12 (×2): 5 mg via ORAL
  Filled 2017-01-11 (×3): qty 1

## 2017-01-11 NOTE — Progress Notes (Signed)
[facility]                                                                              Patient Demographics  Jeffery [name], is a 66 y.o. male, DOB - 1951/03/21, WUJ:[id number]  Admit date - 01/03/2017   Admitting Physician Joseph Art, DO  Outpatient Primary MD for the patient is Julian Hy, MD  Outpatient specialists:   LOS - 8  days    No chief complaint on file.      Brief summary   Patient is a 7 show gentleman history of diabetes, end-stage liver disease/cirrhosis, anemia, thrombocytopenia, status post recent left BKA secondary to osteomyelitis who was admitted from the inpatient rehabilitation when his stump began to go some blood and patient noted to be anemic and thrombocytopenic. Patient was seen by hematology/oncology were recommended that patient be admitted to the acute hospital for transfusion of FFP, packed red blood cells, management of decompensated cirrhosis and ongoing management. Patient being followed by nephrology due to probable hepatorenal syndrome. Patient's bleeding seems to have stabilized. Patient with recurrent ascites status post paracentesis with 2.6 L removed with some symptomatic improvement. Palliative care consultation obtained.   Assessment & Plan    Principal problem anemia of chronic illness secondary to chronic blood loss and chronic kidney disease - Patient was transferred from inpatient rehabilitation at the request of hematology oncology, as patient was noted to be significantly pale and noted to have a symptomatic anemia with a hemoglobin down to 7.2.  - Patient status post 2 units packed red blood cell transfusion 01/04/2017 - Hemoglobin currently 9.5. Goal hemoglobin greater than 8 per hematology. Hematology/oncology following.  Active problems   chronic thrombocytopenia Secondary to cirrhosis, splenomegaly per hematology oncology. Patient was transfused 2 units of FFP on 01/03/2017.  Follow.  Recurrent ascites secondary to portal hypertension and decompensated liver failure/end-stage liver disease - Patient noted to have third spacing due to progressive liver disease, MELD score of 29.  -  status post US guided paracentesis with paracenteses with 1.5 L maximum removed of clear yellow fluid on 4/18 prior to transfer from inpatient rehabilitation. -  Per nephrology and GI, patient deemed not a transplant candidate. Continue PPI, Xifaxan.  - Patient noted to have complaints of shortness of breath and patient feels likely a ascites pushing on his diaphragm. Abdominal ultrasound obtained 01/08/2017 consistent with large ascites. Ultrasound-guided paracentesis on 01/09/2017 with 2.6 L removed, with albumin   coagulopathy - Secondary to end-stage liver disease/cirrhosis. - Patient status post 2 units FFP 01/03/2017.  - Due to significant synthetic liver dysfunction, it is felt per hematology the patient likely will not benefit from vitamin K. Goal INR per hematology INR close to <1.5. Patient noted to have a very low fibrinogen level and as such 2units of cryoprecipitate was ordered per hematology. Per hematology goal INR less than 2 and fibrinogen greater than 150. If INR >2, hematology recommended transfusion of 2 units of FFP.    Acute on chronic kidney disease stage III - Likely secondary to prerenal azotemia in the setting of anemia and hemodynamic instability and probable hepatorenal syndrome. -  will continue midodrine, octreotide, status post 2 units packed RBCs.  - Continue Lasix per nephrology recommendations   peripheral vascular disease status post left BKA 12/23/2016 per Dr. Ophelia Charter   stump site noted to be bleeding prior to admission.  - Patient status post 2 units FFP. Patient s/p 2 units packed red blood cells.  -  Dr. Janee Morn spoke with Dr. Ophelia Charter and patient dressing changes of: Betadine swab to incision at time of dressing change and dry dressing changes daily  with Ace wrap.   diabetes mellitus - Hemoglobin A1c was 5.8 on 10/24/2016.    severe protein calorie malnutrition/ severe cachexia Dietitian following.  Hyperuricemia - rasburicase per hematology/oncology.  hyponatremia Likely secondary to volume overload, currently stable. Hyponatremia improved with diuresis.   prognosis Patient with a poor prognosis. Patient with cirrhosis of the liver with decompensation requiring paracenteses, anemic and thrombocytopenic with a coagulopathy status post 2 units FFP and being transfused 2 units packed red blood cells. Patient debilitated with a meld score of 29, severe protein calorie malnutrition. Patient deemed not a transplant candidate per GI and nephrology. Palliative care following. Hospice has been consulted.     DVT Prophylaxis:  Patient with right AKA, recent left BKA, anemic and thrombocytopenic. Code Status: Full Family Communication: Discussed in detail with the patient, all imaging results, lab results explained to the patient   Disposition Plan: DC home in a.m. with hospice time Spent in minutes  25 minutes  Procedures:   2 units FFP 01/03/2017  2 units packed red blood cells pending 01/04/2017  Transfusion of cryoprecipitate 01/05/2017  Consultants:    Hematology/oncology Dr.Gorsuch   Gastroenterology Dr.Danis III 01/04/2017  Palliative care Dr. Linna Darner 01/04/2017  Nephrology Dr. Signe Colt*  Antimicrobials:   None    Medications  Scheduled Meds: . feeding supplement (ENSURE ENLIVE)  237 mL Oral TID BM  . feeding supplement (PRO-STAT SUGAR FREE 64)  30 mL Oral Q1500  . mouth rinse  15 mL Mouth Rinse BID  . midodrine  15 mg Oral TID WC  . multivitamin with minerals  1 tablet Oral Daily  . octreotide  100 mcg Subcutaneous Q8H  . pantoprazole  40 mg Oral BID  . rifaximin  550 mg Oral BID  . saccharomyces boulardii  250 mg Oral BID  . sodium chloride flush  3 mL Intravenous Q12H   Continuous  Infusions: PRN Meds:.lidocaine (PF), metoCLOPramide, oxyCODONE   Antibiotics   Anti-infectives    Start     Dose/Rate Route Frequency Ordered Stop   01/03/17 2200  rifaximin (XIFAXAN) tablet 550 mg     550 mg Oral 2 times daily 01/03/17 1848          Subjective:   Jeffery [name] was seen and examined today.  Alert and oriented, denies any specific complaints. Patient denies dizziness, chest pain, shortness of breath, abdominal pain, N/V/D/C, new weakness, numbess, tingling. No acute events overnight.    Objective:   Vitals:   01/10/17 1635 01/10/17 2058 01/11/17 0516 01/11/17 1000  BP: 121/65 124/66 107/64 (!) 98/55  Pulse: 75 91 90 90  Resp: 16 16 16 17   Temp: 98.4 F (36.9 C) 98.1 F (36.7 C) 97.9 F (36.6 C) 97.8 F (36.6 C)  TempSrc: Oral Oral Oral Oral  SpO2: 98% 97% 97% 98%  Weight:  107.8 kg (237 lb 9.6 oz)    Height:        Intake/Output Summary (Last 24 hours) at 01/11/17 1512 Last data filed at 01/11/17  1445  Gross per 24 hour  Intake              360 ml  Output              578 ml  Net             -218 ml     Wt Readings from Last 3 Encounters:  01/10/17 107.8 kg (237 lb 9.6 oz)  01/03/17 111.5 kg (245 lb 14.4 oz)  12/26/16 104.8 kg (231 lb 0.7 oz)     Exam  General: Alert and oriented x 3, NAD  HEENT:    Neck: Supple, no JVD, no masses  Cardiovascular: S1 S2 auscultated, no rubs, murmurs or gallops. Regular rate and rhythm.  Respiratory: Clear to auscultation bilaterally, no wheezing, rales or rhonchi  Gastrointestinal: Soft, nontender, nondistended, + bowel sounds  Ext: no cyanosis clubbing Left BKA with stump dressing, right AKA.   Neuro:   Skin: No rashes  Psych: Normal affect and demeanor, alert and oriented x3    Data Reviewed:  I have personally reviewed following labs and imaging studies  Micro Results No results found for this or any previous visit (from the past 240 hour(s)).  Radiology Reports Dg Chest 2  View  Result Date: 01/01/2017 CLINICAL DATA:  Persistent cough. EXAM: CHEST  2 VIEW COMPARISON:  12/18/2016 FINDINGS: AP and lateral views of the chest show markedly low lung volumes without pulmonary edema or pleural effusion. Probable subsegmental atelectasis right base is new in the 2 week interval since prior study. The cardiopericardial silhouette is within normal limits for size. The visualized bony structures of the thorax are intact. IMPRESSION: New right basilar opacity suggests atelectasis. Low lung volumes. Electronically Signed   By: Kennith Center M.D.   On: 01/01/2017 17:38   US Renal  Result Date: 12/19/2016 CLINICAL DATA:  Acute kidney injury EXAM: RENAL / URINARY TRACT ULTRASOUND COMPLETE COMPARISON:  None. FINDINGS: Right Kidney: Length: 11.3 cm. Increased parenchymal echogenicity consistent with medical renal disease. 2 cm cyst at the lateral midpole region. No hydronephrosis. Left Kidney: Length: 12.8 cm. Increased parenchymal echogenicity consistent with medical renal disease. 4.9 cm cyst at the lower pole. 1.4 cm calculus in the midpole collecting system. No hydronephrosis. Bladder: The urinary bladder is empty. Moderate volume peritoneal ascites. IMPRESSION: 1. Increased renal parenchymal echogenicity consistent with medical renal disease. No hydronephrosis. 2. Left midpole calcification may represent a 14 mm collecting system calculus. No obstructing calculi. 3. Peritoneal ascites. Electronically Signed   By: Ellery Plunk M.D.   On: 12/19/2016 03:37   Mr Foot Left Wo Contrast  Result Date: 12/22/2016 CLINICAL DATA:  Diabetic foot ulcer.  History of prior amputation. EXAM: MRI OF THE LEFT FOOT WITHOUT CONTRAST TECHNIQUE: Multiplanar, multisequence MR imaging of the MRI 10/24/2016 was performed. No intravenous contrast was administered. COMPARISON:  MRI 10/24/2016 FINDINGS: Bones/Joint/Cartilage Status post amputation of the first ray back to the proximal first metatarsal. Signal  abnormality in the base of metatarsal is worrisome for osteomyelitis. There is also osteomyelitis involving the second metatarsal and proximal phalanx with marked subluxation and possible septic arthritis. There is also an open wound on the plantar aspect of the forefoot overlying the region of the second metatarsal head. Diffuse cellulitis and myofasciitis. The irregular fluid collection along the medial aspect of the second metatarsal and proximal phalanx suspicious for abscess. The The tibiotalar and subtalar joints are maintained. Small joint effusions. No findings for mid or hindfoot osteomyelitis. IMPRESSION:  MR findings consistent with osteomyelitis involving the second metatarsal and second proximal phalanx with probable intervening septic arthritis. There is also cellulitis and myofasciitis and a small abscess along the medial aspect of the second ray. Status post amputation of the first metatarsal. Signal abnormality in the base of the metatarsal could be postsurgical or due to osteomyelitis. Electronically Signed   By: Rudie Meyer M.D.   On: 12/22/2016 16:33   US Abdomen Limited  Result Date: 01/08/2017 CLINICAL DATA:  Assess for ascites EXAM: LIMITED ABDOMEN ULTRASOUND FOR ASCITES TECHNIQUE: Limited ultrasound survey for ascites was performed in all four abdominal quadrants. COMPARISON:  None. FINDINGS: There is a large amount of ascites noted in all 4 quadrants of the abdomen/ pelvis. IMPRESSION: Large volume ascites. Electronically Signed   By: Charlett Nose M.D.   On: 01/08/2017 10:29   Dg Chest Portable 1 View  Result Date: 12/18/2016 CLINICAL DATA:  Dyspnea are EXAM: PORTABLE CHEST 1 VIEW COMPARISON:  None. FINDINGS: Shallow lung inflation. The heart size and mediastinal contours are within normal limits. Both lungs are clear. The visualized skeletal structures are unremarkable. IMPRESSION: No active disease. Electronically Signed   By: Deatra Robinson M.D.   On: 12/18/2016 19:48   Dg Abd  Portable 1v  Result Date: 12/19/2016 CLINICAL DATA:  Abdominal pain today. EXAM: PORTABLE ABDOMEN - 1 VIEW COMPARISON:  Plain films the abdomen 10/30/2016. CT abdomen and pelvis 10/23/2016. FINDINGS: The bowel gas pattern is normal. No radio-opaque calculi or other significant radiographic abnormality are seen. IMPRESSION: Negative exam. Electronically Signed   By: Drusilla Kanner M.D.   On: 12/19/2016 12:35   Xr Foot Complete Left  Result Date: 12/14/2016 Review x-rays left foot obtained that shows first ray amputation no evidence of osteomyelitis in the proximal portion of the first metatarsal. He has subluxation of the metatarsal phalangeal joint second third fourth. Patient has some soft tissue swelling on x-ray but no evidence of bone infection. Impression status post first ray amputation left foot. No changes suggestive of osteomyelitis currently.  Ir Paracentesis  Result Date: 01/09/2017 INDICATION: Cryptogenic cirrhosis with recurrent abdominal ascites and renal insufficiency. Request is made for therapeutic paracentesis with a 3 L maximum. EXAM: ULTRASOUND GUIDED THERAPEUTIC PARACENTESIS MEDICATIONS: 1% lidocaine COMPLICATIONS: None immediate. PROCEDURE: Informed written consent was obtained from the patient after a discussion of the risks, benefits and alternatives to treatment. A timeout was performed prior to the initiation of the procedure. Initial ultrasound scanning demonstrates a moderate amount of ascites within the right lower abdominal quadrant. The right lower abdomen was prepped and draped in the usual sterile fashion. 1% lidocaine was used for local anesthesia. Following this, a 19 gauge, 10-cm, Yueh catheter was introduced. An ultrasound image was saved for documentation purposes. The paracentesis was performed. The catheter was removed and a dressing was applied. The patient tolerated the procedure well without immediate post procedural complication. FINDINGS: A total of  approximately 2.6 L of serous fluid was removed. IMPRESSION: Successful ultrasound-guided paracentesis yielding 2.6 liters of peritoneal fluid. Read by: Barnetta Chapel, PA-C Electronically Signed   By: Gilmer Mor D.O.   On: 01/09/2017 14:23   Ir Paracentesis  Result Date: 01/03/2017 INDICATION: Patient with recurrent ascites. Request is made for therapeutic paracentesis of up to 1.5 liters. EXAM: ULTRASOUND GUIDED THERAPEUTIC PARACENTESIS MEDICATIONS: 10 mL 1% lidocaine COMPLICATIONS: None immediate. PROCEDURE: Informed written consent was obtained from the patient after a discussion of the risks, benefits and alternatives to treatment. A timeout was performed  prior to the initiation of the procedure. Initial ultrasound scanning demonstrates a large amount of ascites within the right lateral abdomen. The right lateral abdomen was prepped and draped in the usual sterile fashion. 1% lidocaine was used for local anesthesia. Following this, a 19 gauge, 7-cm, Yueh catheter was introduced. An ultrasound image was saved for documentation purposes. The paracentesis was performed. The catheter was removed and a dressing was applied. The patient tolerated the procedure well without immediate post procedural complication. FINDINGS: A total of approximately 1.5 liters of clear, yellow fluid was removed. Samples were sent to the laboratory as requested by the clinical team. IMPRESSION: Successful ultrasound-guided paracentesis yielding 1.5 liters of peritoneal fluid. Read by:  Loyce Dys PA-C Electronically Signed   By: Jolaine Click M.D.   On: 01/03/2017 16:14   Ir Paracentesis  Result Date: 12/29/2016 INDICATION: Patient with recurrent ascites. Request is made for diagnostic and therapeutic paracentesis. Patient has a 1 liter maximum. EXAM: ULTRASOUND GUIDED DIAGNOSTIC AND THERAPEUTIC PARACENTESIS MEDICATIONS: 10 mL 1% lidocaine COMPLICATIONS: None immediate. PROCEDURE: Informed written consent was obtained from  the patient after a discussion of the risks, benefits and alternatives to treatment. A timeout was performed prior to the initiation of the procedure. Initial ultrasound scanning demonstrates a large amount of ascites within the left lateral abdomen. The left lateral abdomen was prepped and draped in the usual sterile fashion. 1% lidocaine was used for local anesthesia. Following this, a 19 gauge, 10-cm, Yueh catheter was introduced. An ultrasound image was saved for documentation purposes. The paracentesis was performed. The catheter was removed and a dressing was applied. The patient tolerated the procedure well without immediate post procedural complication. FINDINGS: A total of approximately 1.0 liters of clear, yellow fluid was removed. Samples were sent to the laboratory as requested by the clinical team. IMPRESSION: Successful ultrasound-guided paracentesis yielding 1.0 liters of peritoneal fluid. Read by:  Loyce Dys PA-C Electronically Signed   By: Simonne Come M.D.   On: 12/29/2016 13:33   Ir Paracentesis  Result Date: 12/20/2016 INDICATION: Recurrent ascites EXAM: ULTRASOUND-GUIDED PARACENTESIS COMPARISON:  Previous paracentesis MEDICATIONS: 10 cc 1% lidocaine COMPLICATIONS: None immediate. TECHNIQUE: Informed written consent was obtained from the patient after a discussion of the risks, benefits and alternatives to treatment. A timeout was performed prior to the initiation of the procedure. Initial ultrasound scanning demonstrates a large amount of ascites within the left lower abdominal quadrant. The left lower abdomen was prepped and draped in the usual sterile fashion. 1% lidocaine with epinephrine was used for local anesthesia. Under direct ultrasound guidance, a 19 gauge, 7-cm, Yueh catheter was introduced. An ultrasound image was saved for documentation purposed. The paracentesis was performed. The catheter was removed and a dressing was applied. The patient tolerated the procedure well  without immediate post procedural complication. FINDINGS: A total of approximately 50 cc only of yellow fluid was removed. Samples were sent to the laboratory as requested by the clinical team. IMPRESSION: Successful ultrasound-guided paracentesis yielding 50 cc only of peritoneal fluid. Diagnostic only per MD. 50 cc only. Read by Robet Leu [facility] Electronically Signed   By: Judie Petit.  Shick M.D.   On: 12/20/2016 15:14    Lab Data:  CBC:  Recent Labs Lab 01/06/17 6295 01/07/17 2841 01/08/17 3244 01/09/17 0307 01/10/17 0637 01/11/17 0531  WBC 3.5* 3.9* 3.5* 3.6* 3.3* 3.5*  NEUTROABS 1.8 2.2 2.0  --  1.7 2.0  HGB 9.9* 9.8* 9.9* 10.9* 9.7* 9.5*  HCT 29.9* 30.0* 30.2* 32.1*  28.5* 28.1*  MCV 94.6 95.5 95.9 97.0 98.3 97.9  PLT 84* 99* 98* 99* 80* 83*   Basic Metabolic Panel:  Recent Labs Lab 01/07/17 0712 01/08/17 0613 01/09/17 0307 01/10/17 0637 01/11/17 0531  NA 128* 131* 132* 132* 132*  K 4.5 5.0 4.4 3.8 4.0  CL 100* 103 103 105 105  CO2 21* 22 19* 21* 19*  GLUCOSE 124* 95 157* 151* 151*  BUN 57* 61* 61* 59* 58*  CREATININE 3.27* 3.11* 3.12* 3.23* 3.08*  CALCIUM 8.4* 8.5* 8.5* 8.2* 8.4*  PHOS 4.2  --  4.5 4.4 4.6   GFR: Estimated Creatinine Clearance: 30.4 mL/min (A) (by C-G formula based on SCr of 3.08 mg/dL (H)). Liver Function Tests:  Recent Labs Lab 01/05/17 0501 01/06/17 0981 01/07/17 0712 01/08/17 0613 01/09/17 0307 01/10/17 0637 01/11/17 0531  AST 33 32  --  36  --   --   --   ALT 11* 10*  --  10*  --   --   --   ALKPHOS 72 66  --  70  --   --   --   BILITOT 4.0* 3.0*  --  2.9*  --   --   --   PROT 7.4 7.3  --  7.1  --   --   --   ALBUMIN 1.8* 2.0* 2.0* 1.8* 2.0* 2.1* 2.0*   No results for input(s): LIPASE, AMYLASE in the last 168 hours. No results for input(s): AMMONIA in the last 168 hours. Coagulation Profile:  Recent Labs Lab 01/05/17 0501 01/06/17 1914 01/07/17 0712 01/08/17 0613 01/11/17 0531  INR 1.93 1.59 1.59 1.73 1.88   Cardiac  Enzymes: No results for input(s): CKTOTAL, CKMB, CKMBINDEX, TROPONINI in the last 168 hours. BNP (last 3 results) No results for input(s): PROBNP in the last 8760 hours. HbA1C: No results for input(s): HGBA1C in the last 72 hours. CBG:  Recent Labs Lab 01/09/17 1338 01/09/17 1709 01/10/17 0809 01/10/17 1213 01/10/17 2105  GLUCAP 131* 151* 180* 151* 189*   Lipid Profile: No results for input(s): CHOL, HDL, LDLCALC, TRIG, CHOLHDL, LDLDIRECT in the last 72 hours. Thyroid Function Tests: No results for input(s): TSH, T4TOTAL, FREET4, T3FREE, THYROIDAB in the last 72 hours. Anemia Panel: No results for input(s): VITAMINB12, FOLATE, FERRITIN, TIBC, IRON, RETICCTPCT in the last 72 hours. Urine analysis:    Component Value Date/Time   COLORURINE AMBER (A) 12/31/2016 1441   APPEARANCEUR HAZY (A) 12/31/2016 1441   LABSPEC 1.018 12/31/2016 1441   PHURINE 5.0 12/31/2016 1441   GLUCOSEU NEGATIVE 12/31/2016 1441   HGBUR MODERATE (A) 12/31/2016 1441   BILIRUBINUR NEGATIVE 12/31/2016 1441   KETONESUR NEGATIVE 12/31/2016 1441   PROTEINUR NEGATIVE 12/31/2016 1441   NITRITE NEGATIVE 12/31/2016 1441   LEUKOCYTESUR NEGATIVE 12/31/2016 1441     Yavier Snider M.D. [facility] 01/11/2017, 3:12 PM  Pager: [phone] Between 7am to 7pm - call Pager - [phone]  After 7pm go to [email or web address] - password [email or web address]  Call night coverage person covering after 7pm

## 2017-01-11 NOTE — Progress Notes (Signed)
[facility])  Follow up visit with Jeffery [name] this morning to confirm plan for discharge home tomorrow. DME ordering this morning includes: hospital bed with split rails, over bed table, hoyer lift and BSC. [facility] will contact Ben to arrange delivery. [facility] plans to see patient at home tomorrow afternoon or evening. [facility] liaison will follow up again tomorrow.   Thank you,  Forrestine Him, LCSW [phone]

## 2017-01-11 NOTE — Progress Notes (Signed)
Patient ID: Lj Miyamoto, male   DOB: [birth year], 66 y.o.   MRN: [id number]  This NP visited patient at the bedside with patient, emotional support as he prepares to discharge home tomorrow with hospice services.  He feels good about his plan.  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  c/o today of phantom right foot and leg pain.   Increased Oxycodone to 10 mg po every 4 hrs    Questions and concerns addressed.  Patient  encouraged to call with questions or concerns.  PMT will continue to support holistically.   Prognosis is likely weeks to months   Time in  1640    Time out 1700       Total time  Minutes  20 min  Greater than 50% of the time was spent in counseling and coordination of care  Lorinda Creed NP  Palliative Medicine Team Team Phone # [phone] Pager [phone]

## 2017-01-11 NOTE — Progress Notes (Addendum)
Patient ID: Jeffery [name], male   DOB: 1951-05-28, 66 y.o.   MRN: [id number] S: no new complaints today O:BP (!) 98/55 (BP Location: Right Arm)   Pulse 90   Temp 97.8 F (36.6 C) (Oral)   Resp 16   Ht  (1.854 m)   Wt 107.8 kg (237 lb 9.6 oz)   SpO2 98%   BMI 31.35 kg/m   Intake/Output Summary (Last 24 hours) at 01/11/17 1234 Last data filed at 01/11/17 1100  Gross per 24 hour  Intake                0 ml  Output              578 ml  Net             -578 ml   Intake/Output: I/O last 3 completed shifts: In: 0  Out: 1002 [Urine:1000; Stool:2]  Intake/Output this shift:  Total I/O In: 0  Out: 151 [Urine:150; Stool:1] Weight change: -0.635 kg (-1 lb 6.4 oz) Gen:NAD CVS:no rub Resp:decreased BS at bases WUJ:[id number], +abdominal wall edema and fluid wave Ext:2+ edema/anasarca   Recent Labs Lab 01/05/17 0501 01/06/17 9562 01/07/17 0712 01/08/17 0613 01/09/17 0307 01/10/17 0637 01/11/17 0531  NA 130* 129* 128* 131* 132* 132* 132*  K 4.7 4.6 4.5 5.0 4.4 3.8 4.0  CL 102 101 100* 103 103 105 105  CO2 21* 20* 21* 22 19* 21* 19*  GLUCOSE 125* 113* 124* 95 157* 151* 151*  BUN 57* 57* 57* 61* 61* 59* 58*  CREATININE 3.35* 3.35* 3.27* 3.11* 3.12* 3.23* 3.08*  ALBUMIN 1.8* 2.0* 2.0* 1.8* 2.0* 2.1* 2.0*  CALCIUM 8.3* 8.4* 8.4* 8.5* 8.5* 8.2* 8.4*  PHOS  --   --  4.2  --  4.5 4.4 4.6  AST 33 32  --  36  --   --   --   ALT 11* 10*  --  10*  --   --   --    Liver Function Tests:  Recent Labs Lab 01/05/17 0501 01/06/17 0652  01/08/17 0613 01/09/17 0307 01/10/17 0637 01/11/17 0531  AST 33 32  --  36  --   --   --   ALT 11* 10*  --  10*  --   --   --   ALKPHOS 72 66  --  70  --   --   --   BILITOT 4.0* 3.0*  --  2.9*  --   --   --   PROT 7.4 7.3  --  7.1  --   --   --   ALBUMIN 1.8* 2.0*  < > 1.8* 2.0* 2.1* 2.0*  < > = values in this interval not displayed. No results for input(s): LIPASE, AMYLASE in the last 168 hours. No results for input(s): AMMONIA in the  last 168 hours. CBC:  Recent Labs Lab 01/07/17 0712 01/08/17 0613 01/09/17 0307 01/10/17 0637 01/11/17 0531  WBC 3.9* 3.5* 3.6* 3.3* 3.5*  NEUTROABS 2.2 2.0  --  1.7 2.0  HGB 9.8* 9.9* 10.9* 9.7* 9.5*  HCT 30.0* 30.2* 32.1* 28.5* 28.1*  MCV 95.5 95.9 97.0 98.3 97.9  PLT 99* 98* 99* 80* 83*   Cardiac Enzymes: No results for input(s): CKTOTAL, CKMB, CKMBINDEX, TROPONINI in the last 168 hours. CBG:  Recent Labs Lab 01/09/17 1338 01/09/17 1709 01/10/17 0809 01/10/17 1213 01/10/17 2105  GLUCAP 131* 151* 180* 151* 189*    Iron Studies: No results for input(s):  IRON, TIBC, TRANSFERRIN, FERRITIN in the last 72 hours. 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 Request is made for therapeutic paracentesis with a 3 L maximum. EXAM: ULTRASOUND GUIDED THERAPEUTIC PARACENTESIS MEDICATIONS: 1% lidocaine COMPLICATIONS: None immediate. PROCEDURE: Informed written consent was obtained from the patient after a discussion of the risks, benefits and alternatives to treatment. A timeout was performed prior to the initiation of the procedure. Initial ultrasound scanning demonstrates a moderate amount of ascites within the right lower abdominal quadrant. The right lower abdomen was prepped and draped in the usual sterile fashion. 1% lidocaine was used for local anesthesia. Following this, a 19 gauge, 10-cm, Yueh catheter was introduced. An ultrasound image was saved for documentation purposes. The paracentesis was performed. The catheter was removed and a dressing was applied. The patient tolerated the procedure well without immediate post procedural complication. FINDINGS: A total of approximately 2.6 L of serous fluid was removed. IMPRESSION: Successful ultrasound-guided paracentesis yielding 2.6 liters of peritoneal fluid. Read by: Barnetta Chapel, PA-C Electronically Signed   By: Gilmer Mor D.O.    On: 01/09/2017 14:23   . feeding supplement (ENSURE ENLIVE)  237 mL Oral TID BM  . feeding supplement (PRO-STAT SUGAR FREE 64)  30 mL Oral Q1500  . mouth rinse  15 mL Mouth Rinse BID  . midodrine  15 mg Oral TID WC  . multivitamin with minerals  1 tablet Oral Daily  . octreotide  100 mcg Subcutaneous Q8H  . pantoprazole  40 mg Oral BID  . rifaximin  550 mg Oral BID  . saccharomyces boulardii  250 mg Oral BID  . sodium chloride flush  3 mL Intravenous Q12H    BMET    Component Value Date/Time   NA 132 (L) 01/11/2017 0531   NA 133 (L) 12/04/2016 1031   K 4.0 01/11/2017 0531   K 4.4 12/04/2016 1031   CL 105 01/11/2017 0531   CO2 19 (L) 01/11/2017 0531   CO2 22 12/04/2016 1031   GLUCOSE 151 (H) 01/11/2017 0531   GLUCOSE 140 12/04/2016 1031   BUN 58 (H) 01/11/2017 0531   BUN 31.8 (H) 12/04/2016 1031   CREATININE 3.08 (H) 01/11/2017 0531   CREATININE 1.6 (H) 12/04/2016 1031   CALCIUM 8.4 (L) 01/11/2017 0531   CALCIUM 8.3 (L) 12/04/2016 1031   GFRNONAA 20 (L) 01/11/2017 0531   GFRAA 23 (L) 01/11/2017 0531   CBC    Component Value Date/Time   WBC 3.5 (L) 01/11/2017 0531   RBC 2.87 (L) 01/11/2017 0531   HGB 9.5 (L) 01/11/2017 0531   HGB 10.2 (L) 12/04/2016 1030   HCT 28.1 (L) 01/11/2017 0531   HCT 30.5 (L) 12/04/2016 1030   PLT 83 (L) 01/11/2017 0531   PLT 115 (L) 12/04/2016 1030   MCV 97.9 01/11/2017 0531   MCV 100.7 (H) 12/04/2016 1030   MCH 33.1 01/11/2017 0531   MCHC 33.8 01/11/2017 0531   RDW 22.4 (H) 01/11/2017 0531   RDW 20.9 (H) 12/04/2016 1030   LYMPHSABS 0.9 01/11/2017 0531   LYMPHSABS 0.8 (L) 12/04/2016 1030   MONOABS 0.3 01/11/2017 0531   MONOABS 0.6 12/04/2016 1030   EOSABS 0.3 01/11/2017 0531   EOSABS 0.7 (H) 12/04/2016 1030   BASOSABS 0.0 01/11/2017 0531   BASOSABS 0.0 12/04/2016 1030     Assessment/Plan:  1. AKI/CKD stage 3- in setting of ABLA and low BP with possible hepatorenal syndrome. He appears to be responding  to midodrine/octreotide  with small improvement of Scr. He is not a dialysis or transplant candidate and would continue with conservative management for now. Has diuresed some, however still with marked ascites and discomfort, although improved following paracentesis of 2.6l.  Scr continues to slowly improve despite diuresis which is hopeful. 2. ESLD- grade 1 varices and severe portal hypertensive gastropathy, diuretic resistant ascites. Not a transplant candidate per GI. 3. Anasarca- s/p paracentesis and responding to IV lasix.  Would recommend discharging on torsemide  daily and follow labs as an outpatient with hospice. 4. Anemia/thrombocytopenia- due to ESLD and CKD 5. DM- per primary 6. PVD s/p LKBA 7. Hyponatremia- due to anasarca 8. Disposition- poor overall prognosis. Agree with palliative care consult to help set goals/limits of care as he is not a transplant candidate due to multiple co-morbidities and PVD.  He is adamant that he does not to go to a SNF and wants to go home.  I recommend home with hospice if he can arrange for a sitter during the day.  Discharge planning still underway.  Irena Cords, MD [employer] [phone]

## 2017-01-12 LAB — GLUCOSE, CAPILLARY
GLUCOSE-CAPILLARY: 156 mg/dL — AB (ref 65–99)
Glucose-Capillary: 113 mg/dL — ABNORMAL HIGH (ref 65–99)

## 2017-01-12 MED ORDER — OCTREOTIDE ACETATE 100 MCG/ML IJ SOLN: 100.0000 ug | Freq: Three times a day (TID) | INTRAMUSCULAR | 3 refills | Status: DC

## 2017-01-12 MED ORDER — PRO-STAT SUGAR FREE PO LIQD: 30.0000 mL | Freq: Every day | ORAL | 0 refills | Status: DC

## 2017-01-12 MED ORDER — TORSEMIDE 20 MG PO TABS: 20.0000 mg | ORAL_TABLET | Freq: Every day | ORAL | 3 refills | Status: AC

## 2017-01-12 MED ORDER — CYCLOBENZAPRINE HCL 5 MG PO TABS: 5.0000 mg | ORAL_TABLET | Freq: Two times a day (BID) | ORAL | 0 refills | Status: AC | PRN

## 2017-01-12 MED ORDER — BRIMONIDINE TARTRATE 0.15 % OP SOLN
1.0000 [drp] | Freq: Every day | OPHTHALMIC | Status: DC
  Administered 2017-01-12: 1 [drp] via OPHTHALMIC
  Filled 2017-01-12: qty 5

## 2017-01-12 MED ORDER — METOCLOPRAMIDE HCL 5 MG PO TABS: 5.0000 mg | ORAL_TABLET | Freq: Four times a day (QID) | ORAL | 0 refills | Status: AC | PRN

## 2017-01-12 MED ORDER — TORSEMIDE 20 MG PO TABS
20.0000 mg | ORAL_TABLET | Freq: Every day | ORAL | Status: DC
  Administered 2017-01-12: 20 mg via ORAL
  Filled 2017-01-12: qty 1

## 2017-01-12 MED ORDER — OXYCODONE HCL 10 MG PO TABS: 10.0000 mg | ORAL_TABLET | ORAL | 0 refills | Status: AC | PRN

## 2017-01-12 NOTE — Progress Notes (Signed)
Jeffery [name] to be D/C'd Home per MD order.  Discussed prescriptions and follow up appointments with the patient. Prescriptions given to patient, medication list explained in detail. Pt verbalized understanding.  Allergies as of 01/12/2017   No Known Allergies     Medication List    STOP taking these medications   ipratropium-albuterol 0.5-2.5 (3) MG/3ML Soln Commonly known as:  DUONEB   sucralfate 1 GM/10ML suspension Commonly known as:  CARAFATE     TAKE these medications   brimonidine 0.1 % Soln Commonly known as:  ALPHAGAN P Place 1 drop into both eyes daily.   cyclobenzaprine 5 MG tablet Commonly known as:  FLEXERIL Take 1 tablet (5 mg total) by mouth 2 (two) times daily as needed for muscle spasms.   feeding supplement (ENSURE ENLIVE) Liqd Take 237 mLs by mouth 2 (two) times daily between meals.   feeding supplement (PRO-STAT SUGAR FREE 64) Liqd Take 30 mLs by mouth daily.   lactulose 10 GM/15ML solution Commonly known as:  CHRONULAC Take 15 mLs (10 g total) by mouth 3 (three) times daily.   loperamide 2 MG capsule Commonly known as:  IMODIUM Take 1 capsule (2 mg total) by mouth as needed for diarrhea or loose stools.   metoCLOPramide 5 MG tablet Commonly known as:  REGLAN Take 1 tablet (5 mg total) by mouth every 6 (six) hours as needed for nausea.   midodrine 5 MG tablet Commonly known as:  PROAMATINE Take 3 tablets (15 mg total) by mouth 3 (three) times daily with meals.   multivitamin with minerals Tabs tablet Take 1 tablet by mouth daily.   octreotide 100 MCG/ML Soln injection Commonly known as:  SANDOSTATIN Inject 1 mL (100 mcg total) into the skin every 8 (eight) hours.   Oxycodone HCl 10 MG Tabs Commonly known as:  ROXICODONE Take 1 tablet (10 mg total) by mouth every 4 (four) hours as needed for severe pain.   pantoprazole 40 MG tablet Commonly known as:  PROTONIX Take 1 tablet (40 mg total) by mouth 2 (two) times daily.   rifaximin 550  MG Tabs tablet Commonly known as:  XIFAXAN Take 1 tablet (550 mg total) by mouth 2 (two) times daily.   saccharomyces boulardii 250 MG capsule Commonly known as:  FLORASTOR Take 1 capsule (250 mg total) by mouth 2 (two) times daily.   torsemide 20 MG tablet Commonly known as:  DEMADEX Take 1 tablet (20 mg total) by mouth daily.            Durable Medical Equipment        Start     Ordered   01/10/17 [id number]  For home use only DME Hospital bed  Once    Question Answer Comment  Patient has (list medical condition): PVD recent left BKA/right AKA/ESLD/Acute on CKD III/coagulopathy/thrombocytopenia/debility   The above medical condition requires: Patient requires the ability to reposition frequently   Head must be elevated greater than: 45 degrees   Bed type Semi-electric   Trapeze Bar Yes   Support Surface: Gel Overlay      01/10/17 0834      Vitals:   01/12/17 0621 01/12/17 0832  BP: 93/60 116/68  Pulse: 92 88  Resp: 17 18  Temp: 98.6 F (37 C) 97.5 F (36.4 C)    Skin clean, dry and intact without evidence of skin break down, no evidence of skin tears noted. IV catheter discontinued intact. Site without signs and symptoms of complications. Dressing and pressure  applied. Pt denies pain at this time. No complaints noted.  An After Visit Summary was printed and given to the patient. Patient escorted via stretcher , and D/C home via [facility].  Nelma Rothman, RN [facility] [phone]

## 2017-01-12 NOTE — Progress Notes (Signed)
PT Cancellation Note  Patient Details Name: Jeffery [name] MRN: [id number] DOB: 02-18-1951   Cancelled Treatment:    Reason Eval/Treat Not Completed: Patient declined, no reason specified;Other (comment). Pt reports that he is going home and has a lot to think about. Reports that he will be doing plenty of therapy once he returns home. Advised pt to have nursing contact PT if he changes his mind.    Colin Broach PT, DPT  [phone]  01/12/2017, 10:45 AM

## 2017-01-12 NOTE — Care Management Note (Signed)
Case Management Note  Patient Details  Name: Jeffery [name] MRN: [id number] Date of Birth: 1950/12/25  Subjective/Objective:     CM following for progression and d/c planning.               Action/Plan: 01/12/2017 Pt d/c to home via ambulance with assistance of students and neighbors as well as home hospice services.  All DME arranged by [facility] and delivered to home.   Expected Discharge Date:  01/12/17               Expected Discharge Plan:  Home w Hospice Care  In-House Referral:  NA  Discharge planning Services  CM Consult  Post Acute Care Choice:  Hospice Choice offered to:  Patient  DME Arranged:  Hospital bed DME Agency:     HH Arranged:  RN, Nurse's Aide, Social Work [employer] Agency:  [facility]  Status of Service:  In process, will continue to follow  If discussed at Long Length of Stay Meetings, dates discussed:    Additional Comments:  Starlyn Skeans, RN 01/12/2017, 4:00 PM

## 2017-01-12 NOTE — Discharge Summary (Signed)
Physician Discharge Summary   Patient ID: Jeffery [name] MRN: [id number] DOB/AGE: June 19, 1951 66 y.o.  Admit date: 01/03/2017 Discharge date: 01/12/2017  Primary Care Physician:  Julian Hy, MD  Discharge Diagnoses:    . Coagulopathy (HCC) . Thrombocytopenia (HCC) . Cirrhosis of liver with ascites (HCC) . Osteomyelitis (HCC) . Acute renal failure (HCC) . Moderate malnutrition (HCC) . End stage liver disease (HCC) . 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 Ultrasound-guided paracentesis as needed 2. Hospice at home arranged   DIET: Heart healthy diet    Allergies:  No Known Allergies   DISCHARGE MEDICATIONS: Current Discharge Medication List    START taking these medications   Details  Amino Acids-Protein Hydrolys (FEEDING SUPPLEMENT, PRO-STAT SUGAR FREE 64,) LIQD Take 30 mLs by mouth daily. Qty: 900 mL, Refills: 0    cyclobenzaprine (FLEXERIL) 5 MG tablet Take 1 tablet (5 mg total) by mouth 2 (two) times daily as needed for muscle spasms. Qty: 30 tablet, Refills: 0    metoCLOPramide (REGLAN) 5 MG tablet Take 1 tablet (5 mg total) by mouth every 6 (six) hours as needed for nausea. Qty: 30 tablet, Refills: 0    octreotide (SANDOSTATIN) 100 MCG/ML SOLN injection Inject 1 mL (100 mcg total) into the skin every 8 (eight) hours. Qty: 90 mL, Refills: 3    Oxycodone HCl 10 MG TABS Take 1 tablet (10 mg total) by mouth every 4 (four) hours as needed for severe pain. Qty: 30 tablet, Refills: 0    torsemide (DEMADEX) 20 MG tablet Take 1 tablet (20 mg total) by mouth daily. Qty: 30 tablet, Refills: 3      CONTINUE these medications which have NOT CHANGED   Details  brimonidine (ALPHAGAN P) 0.1 % SOLN Place 1  drop into both eyes daily.    feeding supplement, ENSURE ENLIVE, (ENSURE ENLIVE) LIQD Take 237 mLs by mouth 2 (two) times daily between meals. Qty: 237 mL, Refills: 12    lactulose (CHRONULAC) 10 GM/15ML solution Take 15 mLs (10 g total) by mouth 3 (three) times daily. Qty: 240 mL, Refills: 0    loperamide (IMODIUM) 2 MG capsule Take 1 capsule (2 mg total) by mouth as needed for diarrhea or loose stools. Qty: 30 capsule, Refills: 0    midodrine (PROAMATINE) 5 MG tablet Take 3 tablets (15 mg total) by mouth 3 (three) times daily with meals.    Multiple Vitamin (MULTIVITAMIN WITH MINERALS) TABS tablet Take 1 tablet by mouth daily.    pantoprazole (PROTONIX) 40 MG tablet Take 1 tablet (40 mg total) by mouth 2 (two) times daily. Qty: 60 tablet, Refills: 0    rifaximin (XIFAXAN) 550 MG TABS tablet Take 1 tablet (550 mg total) by mouth 2 (two) times daily. Qty: 42 tablet    saccharomyces boulardii (FLORASTOR) 250 MG capsule Take 1 capsule (250 mg total) by mouth 2 (two) times daily. Qty: 10 capsule, Refills: 0      STOP taking these medications     ipratropium-albuterol (DUONEB) 0.5-2.5 (3) MG/3ML SOLN      sucralfate (CARAFATE) 1 GM/10ML suspension          Brief H and P: For complete details please refer to admission H and P, but in brief Patient is a 66 show gentleman  history of diabetes, end-stage liver disease/cirrhosis, anemia, thrombocytopenia, status post recent left BKA secondary to osteomyelitis who was admitted from the inpatient rehabilitation when his stump began to go some blood and patient noted to be anemic and thrombocytopenic. Patient was seen by hematology/oncology were recommended that patient be admitted to the acute hospital for transfusion of FFP, packed red blood cells, management of decompensated cirrhosis and ongoing management. Patient being followed by nephrology due to probable hepatorenal syndrome. Patient's bleeding seems to have stabilized. Patient with  recurrent ascites status post paracentesis with 2.6 L removed with some symptomatic improvement. Palliative care consultation was obtained.  Hospital Course:  anemia of chronic illness secondary to chronic blood loss and chronic kidney disease - Patient was transferred from inpatient rehabilitation at the request of hematology oncology, as patient was noted to be significantly pale and noted to have a symptomatic anemia with a hemoglobin down to 7.2.  - Patient status post 2 units packed red blood cell transfusion 01/04/2017 - Hemoglobin currently 9.5. Goal hemoglobin greater than 8 per hematology. Hematology/oncology following.   chronic thrombocytopenia Secondary to cirrhosis, splenomegaly per hematology oncology. Patient was transfused 2 units of FFP on 01/03/2017.   Recurrent ascites secondary to portal hypertension and decompensated liverfailure/end-stage liver disease - Patient noted to have third spacing due to progressive liver disease, MELD score of 29.  -  status post US guided paracentesis with paracenteses with 1.5 L maximum removed of clear yellow fluid on 4/18 prior to transfer from inpatient rehabilitation. -  Per nephrology and GI, patient was deemed not a transplant candidate. Continue PPI, Xifaxan.  - Patient was noted to have complaints of shortness of breath and patient feels likely a ascites pushing on his diaphragm. Abdominal ultrasound obtained 01/08/2017 consistent with large ascites. Ultrasound-guided paracentesis on 01/09/2017 with 2.6 L removed, with albumin   coagulopathy - Secondary to end-stage liver disease/cirrhosis. - Patient status post 2 units FFP on 01/03/2017.  - Due to significant synthetic liver dysfunction, it is felt per hematology that the patient likely will not benefit from vitamin K. Goal INR per hematology INR close to <1.5. Patient noted to have a very low fibrinogen level and as such 2units of cryoprecipitate was ordered per hematology. Per  hematology goal INR less than 2 and fibrinogen greater than 150. If INR >2, hematology recommended transfusion of 2 units of FFP.    Acute on chronic kidney disease stage III - Likely secondary to prerenal azotemia in the setting of anemia and hemodynamic instability and probable hepatorenal syndrome. -  will continue midodrine, octreotide, status post 2 units packed RBCs.  - Creatinine function has been improving, discussed in detail with Dr. Abel Presto prior to discharge, recommended torsemide 20 mg daily   peripheral vascular disease status post left BKA 12/23/2016 per Dr. Ophelia Charter  stump site noted to be bleeding prior to admission.  - Patient status post 2 units FFP. Patient s/p 2 units packed red blood cells.  -  Dr. Janee Morn spoke with Dr. Ophelia Charter and patient dressing changes of: Betadine swab to incision at time of dressing change and dry dressing changes daily with Ace wrap.   diabetes mellitus - Hemoglobin A1c was 5.8 on 10/24/2016.    severe protein calorie malnutrition/ severe cachexia Dietitian consult was obtained  Hyperuricemia - rasburicase per hematology/oncology.  hyponatremia Likely secondary to volume overload, currently stable. Hyponatremia improved with diuresis.   prognosis Patient with a poor prognosis. Patient with cirrhosis of the liver with decompensation requiring paracenteses, anemic  and thrombocytopenic with a coagulopathy status post 2 units FFP and being transfused 2 units packed red blood cells. Patient debilitated with a meld score of 29, severe protein calorie malnutrition. Patient deemed not a transplant candidate per GI and nephrology. Palliative care following. Hospice has been consulted and home hospice was arranged.     Day of Discharge BP 116/68 (BP Location: Left Arm)   Pulse 88   Temp 97.5 F (36.4 C) (Oral)   Resp 18   Ht  (1.854 m)   Wt 107.8 kg (237 lb 9.6 oz)   SpO2 98%   BMI 31.35 kg/m   Physical Exam: General:  Alert and awake oriented x3 not in any acute distress. HEENT: anicteric sclera, pupils reactive to light and accommodation CVS: S1-S2 clear no murmur rubs or gallops Chest: clear to auscultation bilaterally, no wheezing rales or rhonchi Abdomen: soft nontender, nondistended, normal bowel sounds Extremities: Left BKA with stump dressing, right AKA Neuro: Cranial nerves II-XII intact, no focal neurological deficits   The results of significant diagnostics from this hospitalization (including imaging, microbiology, ancillary and laboratory) are listed below for reference.    LAB RESULTS: Basic Metabolic Panel:  Recent Labs Lab 01/10/17 0637 01/11/17 0531  NA 132* 132*  K 3.8 4.0  CL 105 105  CO2 21* 19*  GLUCOSE 151* 151*  BUN 59* 58*  CREATININE 3.23* 3.08*  CALCIUM 8.2* 8.4*  PHOS 4.4 4.6   Liver Function Tests:  Recent Labs Lab 01/06/17 0652  01/08/17 0613  01/10/17 0637 01/11/17 0531  AST 32  --  36  --   --   --   ALT 10*  --  10*  --   --   --   ALKPHOS 66  --  70  --   --   --   BILITOT 3.0*  --  2.9*  --   --   --   PROT 7.3  --  7.1  --   --   --   ALBUMIN 2.0*  < > 1.8*  < > 2.1* 2.0*  < > = values in this interval not displayed. No results for input(s): LIPASE, AMYLASE in the last 168 hours. No results for input(s): AMMONIA in the last 168 hours. CBC:  Recent Labs Lab 01/10/17 0637 01/11/17 0531  WBC 3.3* 3.5*  NEUTROABS 1.7 2.0  HGB 9.7* 9.5*  HCT 28.5* 28.1*  MCV 98.3 97.9  PLT 80* 83*   Cardiac Enzymes: No results for input(s): CKTOTAL, CKMB, CKMBINDEX, TROPONINI in the last 168 hours. BNP: Invalid input(s): POCBNP CBG:  Recent Labs Lab 01/10/17 2105 01/12/17 0733  GLUCAP 189* 113*    Significant Diagnostic Studies:  Ir Paracentesis  Result Date: 01/03/2017 INDICATION: Patient with recurrent ascites. Request is made for therapeutic paracentesis of up to 1.5 liters. EXAM: ULTRASOUND GUIDED THERAPEUTIC PARACENTESIS MEDICATIONS: 10 mL  1% lidocaine COMPLICATIONS: None immediate. PROCEDURE: Informed written consent was obtained from the patient after a discussion of the risks, benefits and alternatives to treatment. A timeout was performed prior to the initiation of the procedure. Initial ultrasound scanning demonstrates a large amount of ascites within the right lateral abdomen. The right lateral abdomen was prepped and draped in the usual sterile fashion. 1% lidocaine was used for local anesthesia. Following this, a 19 gauge, 7-cm, Yueh catheter was introduced. An ultrasound image was saved for documentation purposes. The paracentesis was performed. The catheter was removed and a dressing was applied. The patient tolerated the procedure  well without immediate post procedural complication. FINDINGS: A total of approximately 1.5 liters of clear, yellow fluid was removed. Samples were sent to the laboratory as requested by the clinical team. IMPRESSION: Successful ultrasound-guided paracentesis yielding 1.5 liters of peritoneal fluid. Read by:  Loyce Dys PA-C Electronically Signed   By: Jolaine Click M.D.   On: 01/03/2017 16:14    2D ECHO:   Disposition and Follow-up: Discharge Instructions    Diet - low sodium heart healthy    Complete by:  As directed    Increase activity slowly    Complete by:  As directed        DISPOSITION: Home with hospice   DISCHARGE FOLLOW-UP Follow-up Information    Julian Hy, MD. Schedule an appointment as soon as possible for a visit in 2 week(s).   Specialty:  Endocrinology Contact information: [address] [place] [address] [phone]            Time spent on Discharge: 29 minutes  Signed:   Thad Ranger M.D. [facility] 01/12/2017, 11:25 AM Pager: [phone]

## 2017-01-18 ENCOUNTER — Telehealth: Payer: Self-pay | Admitting: Gastroenterology

## 2017-01-18 DIAGNOSIS — K7460 Unspecified cirrhosis of liver: Secondary | ICD-10-CM

## 2017-01-18 NOTE — Telephone Encounter (Signed)
I advised Fleet ContrasRachel with hospice that we have been trying to reach the pt to have him come in for labs.  He also has an appt with Dr Christella HartiganJacobs on 01/24/17.  She will have the pt come in for labs and tell him to keep appt.  She will call back if the pt has further concerns or questions

## 2017-01-19 ENCOUNTER — Other Ambulatory Visit (INDEPENDENT_AMBULATORY_CARE_PROVIDER_SITE_OTHER): Payer: BC Managed Care – PPO

## 2017-01-19 DIAGNOSIS — K7460 Unspecified cirrhosis of liver: Secondary | ICD-10-CM

## 2017-01-19 LAB — BASIC METABOLIC PANEL
BUN: 55 mg/dL — AB (ref 6–23)
CHLORIDE: 103 meq/L (ref 96–112)
CO2: 21 mEq/L (ref 19–32)
Calcium: 8.8 mg/dL (ref 8.4–10.5)
Creatinine, Ser: 2.45 mg/dL — ABNORMAL HIGH (ref 0.40–1.50)
GFR: 28.19 mL/min — ABNORMAL LOW (ref >60.00)
GLUCOSE: 175 mg/dL — AB (ref 70–99)
POTASSIUM: 3.5 meq/L (ref 3.5–5.1)
Sodium: 132 mEq/L — ABNORMAL LOW (ref 135–145)

## 2017-01-24 ENCOUNTER — Ambulatory Visit (HOSPITAL_COMMUNITY)
Admission: RE | Admit: 2017-01-24 | Discharge: 2017-01-24 | Disposition: A | Discharge status: Home / Self Care | Payer: BC Managed Care – PPO | Source: Ambulatory Visit | Attending: Gastroenterology | Admitting: Gastroenterology

## 2017-01-24 ENCOUNTER — Ambulatory Visit (INDEPENDENT_AMBULATORY_CARE_PROVIDER_SITE_OTHER): Payer: BC Managed Care – PPO | Admitting: Gastroenterology

## 2017-01-24 ENCOUNTER — Encounter: Payer: Self-pay | Admitting: Gastroenterology

## 2017-01-24 VITALS — BP 110/66 | HR 80

## 2017-01-24 DIAGNOSIS — R600 Localized edema: Secondary | ICD-10-CM | POA: Diagnosis not present

## 2017-01-24 DIAGNOSIS — R188 Other ascites: Secondary | ICD-10-CM | POA: Diagnosis not present

## 2017-01-24 DIAGNOSIS — K7460 Unspecified cirrhosis of liver: Secondary | ICD-10-CM

## 2017-01-24 NOTE — Procedures (Signed)
Ultrasound-guided therapeutic paracentesis performed yielding 3 liters (maximum ordered) of yellow  fluid. No immediate complications.  

## 2017-01-24 NOTE — Progress Notes (Signed)
Review of pertinent gastrointestinal problems: 1. Cirrhosis; suggested by outside imaging (fibrosure test by PCP) 2017. Associated with splenomegaly on that ultrasound. Unclear etiology; thombocytopenia +,   Upper endoscopy 8 2017 Dr. Christella Hartigan showed medium-sized esophageal varices, portal gastropathy positive; he was started on nadolol; Inpatient EGD on 2/14 with findings of multiple superficial nonbleeding esophageal ulcers with stigmata or recent bleeding, severe portal hypertensive gastropathy with adherent blood, erosions , and multiple superficial oozing duodenal ulcers. Findings appeared to be NSAID related.  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  Getting intermittent paracentesis (up to 2-3 liters)  Hospice at home started 12/2016  2. Routine risk for colon cancer. Colonoscopy August 2017 was normal.   HPI: This is a  very pleasant 66 year old man who has end stage cirrhosis complicated by recurrent ascites, likely hepatorenal syndrome, mild encephalopathy, relatively immobile after right AKA and left BKA.  Chief complaint is cirrhosis  He was in the hospital 2-3 times in the past couple months for extended stays. During his last admission palliative care was consulted. He was sent home with hospice care. One of my partners evaluated him and he was also seen by nephrology. Both determined that he was not transplant candidate and that he should be managed palliatively.  He lives alone but some friends check on him. Hospice sees him about twice a week.  He tells me is eating better, and eggs and meats.     ROS: complete GI ROS as described in HPI.  Constitutional:  No unintentional weight loss   Past Medical History:  Diagnosis Date  . Anemia   . Blurred vision    improved now  .  Childhood asthma   . Cholelithiasis   . Eczema    inner wrist  . History of blood transfusion    "all related to anemia"  . Hypertension   . Muscle cramps   . Necrotizing fasciitis (HCC)   . Neuropathy   . Rash    wrist and hands since late june  . Retinal degeneration   . Stomach ulcer   . Type II diabetes mellitus (HCC)     Past Surgical History:  Procedure Laterality Date  . ABOVE KNEE LEG AMPUTATION     right  . AMPUTATION Left 12/23/2016   Procedure: AMPUTATION BELOW KNEE;  Surgeon: Eldred Manges, MD;  Location: [facility] OR;  Service: Orthopedics;  Laterality: Left;  . APPLICATION OF WOUND VAC Left 11/06/2016   Procedure: APPLICATION OF WOUND VAC;  Surgeon: Eldred Manges, MD;  Location: [facility] OR;  Service: Orthopedics;  Laterality: Left;  . CATARACT EXTRACTION W/ INTRAOCULAR LENS  IMPLANT, BILATERAL Bilateral   . COLONOSCOPY WITH PROPOFOL N/A 05/18/2016   Procedure: COLONOSCOPY WITH PROPOFOL;  Surgeon: Rachael Fee, MD;  Location: [facility]COPY;  Service: Endoscopy;  Laterality: N/A;  . ESOPHAGOGASTRODUODENOSCOPY (EGD) WITH PROPOFOL N/A 05/18/2016   Procedure: ESOPHAGOGASTRODUODENOSCOPY (EGD) WITH PROPOFOL;  Surgeon: Rachael Fee, MD;  Location: [facility];  Service: Endoscopy;  Laterality: N/A;  . ESOPHAGOGASTRODUODENOSCOPY (EGD) WITH PROPOFOL N/A 11/01/2016   Procedure: ESOPHAGOGASTRODUODENOSCOPY (EGD) WITH PROPOFOL;  Surgeon: Napoleon Form, MD;  Location: [facility]COPY;  Service: Endoscopy;  Laterality: N/A;  . EYE SURGERY  09/2011   bilateral for retinopathy - laser eye surgery  . FOOT FRACTURE SURGERY Left 2003   5th metatarsal; "put 3  screws in it"  . I&D EXTREMITY Left 10/25/2016   Procedure: LEFT FOOT DEBRIDEMENT RAY AMPUTATION PLACEMENT OF WOUND VAC;  Surgeon: Eldred Manges, MD;  Location: [facility] OR;  Service: Orthopedics;  Laterality: Left;  . I&D EXTREMITY Left 11/06/2016   Procedure: IRRIGATION AND DEBRIDEMENT EXTREMITY/LEFT FOOT;  Surgeon: Eldred Manges, MD;  Location: [facility] OR;   Service: Orthopedics;  Laterality: Left;  . IR PARACENTESIS  12/20/2016  . IR PARACENTESIS  12/29/2016  . IR PARACENTESIS  01/03/2017  . IR PARACENTESIS  01/09/2017  . TOE AMPUTATION Left    big toe    Current Outpatient Prescriptions  Medication Sig Dispense Refill  . brimonidine (ALPHAGAN P) 0.1 % SOLN Place 1 drop into both eyes daily.    . cyclobenzaprine (FLEXERIL) 5 MG tablet Take 1 tablet (5 mg total) by mouth 2 (two) times daily as needed for muscle spasms. 30 tablet 0  . feeding supplement, ENSURE ENLIVE, (ENSURE ENLIVE) LIQD Take 237 mLs by mouth 2 (two) times daily between meals. 237 mL 12  . loperamide (IMODIUM) 2 MG capsule Take 1 capsule (2 mg total) by mouth as needed for diarrhea or loose stools. 30 capsule 0  . metoCLOPramide (REGLAN) 5 MG tablet Take 1 tablet (5 mg total) by mouth every 6 (six) hours as needed for nausea. 30 tablet 0  . midodrine (PROAMATINE) 5 MG tablet Take 3 tablets (15 mg total) by mouth 3 (three) times daily with meals.    . Multiple Vitamin (MULTIVITAMIN WITH MINERALS) TABS tablet Take 1 tablet by mouth daily.    . Oxycodone HCl 10 MG TABS Take 1 tablet (10 mg total) by mouth every 4 (four) hours as needed for severe pain. 30 tablet 0  . pantoprazole (PROTONIX) 40 MG tablet Take 1 tablet (40 mg total) by mouth 2 (two) times daily. 60 tablet 0  . torsemide (DEMADEX) 20 MG tablet Take 1 tablet (20 mg total) by mouth daily. 30 tablet 3   No current facility-administered medications for this visit.     Allergies as of 01/24/2017  . (No Known Allergies)    Family History  Problem Relation Age of Onset  . Cancer Mother     unknown type    Social History   Social History  . Marital status: Single    Spouse name: N/A  . Number of children: N/A  . Years of education: N/A   Occupational History  . Not on file.   Social History Main Topics  . Smoking status: Never Smoker  . Smokeless tobacco: Never Used  . Alcohol use No  . Drug use: No  .  Sexual activity: Not on file     Comment: computer work. next of kin. Sister Marisue Humble next of kin   Other Topics Concern  . Not on file   Social History Narrative  . No narrative on file     Physical Exam: BP 110/66   Pulse 80  Constitutional:Chronically ill-appearing with mild jaundice, pallor; he is coherent but speaks slowly  Psychiatric: alert and oriented x3; mild asterixis  Abdomen:Mildly distended with moderate overt ascites, no peritoneal signs, normal bowel sounds2+ pitting edema in his left thigh  Left BKA, right AKA Sits in a wheelchair  Assessment and plan: 66 y.o. male with end stage liver disease  he is currently getting hospice care home. Most recent creatinine was 2.5. This is better when he was in when he was in the hospital but still obviously not normal. He  has uncomfortable ascites and he tells me this limits his breathing at times. Especially at night. I think it is certainly reasonable to put him on scheduled paracentesis regimen During up to 3 L each time. We will start off with him getting this every other week. In between those weeks we will see if home hospice can draw a basic metabolic profile for him. He was complaining of some darker than usual urine. I think this is probably just related to his renal dysfunction but we are going to ask home hospice to send sterile urine testing, probably straight cath urinalysis with culture and sensitivity. He knows to eat low salt diet.  He will return to see me in 2 months.    Please see the "Patient Instructions" section for addition details about the plan.  Rob Buntinganiel Jacobs, MD [facility] 01/24/2017, 10:06 AM

## 2017-01-24 NOTE — Patient Instructions (Addendum)
You have been scheduled for a paracentesis for today, [facility].  We have given you lab orders for hospice to get a urine sample with culture  This week and then BMET and CBC every other week alternating between paracentesis.  You will have scheduled paracentesis every 2 weeks, you will schedule you appointments today [facility].   Follow up with Dr Christella HartiganJacobs on  03/26/17 at 9 am

## 2017-01-29 LAB — FUNGAL ORGANISM REFLEX

## 2017-01-29 LAB — FUNGUS CULTURE WITH STAIN

## 2017-01-29 LAB — FUNGUS CULTURE RESULT

## 2017-02-02 ENCOUNTER — Inpatient Hospital Stay (INDEPENDENT_AMBULATORY_CARE_PROVIDER_SITE_OTHER): Payer: BC Managed Care – PPO | Admitting: Orthopaedic Surgery

## 2017-02-06 ENCOUNTER — Encounter (INDEPENDENT_AMBULATORY_CARE_PROVIDER_SITE_OTHER): Payer: Self-pay | Admitting: Orthopaedic Surgery

## 2017-02-06 ENCOUNTER — Ambulatory Visit (INDEPENDENT_AMBULATORY_CARE_PROVIDER_SITE_OTHER): Payer: BC Managed Care – PPO | Admitting: Orthopaedic Surgery

## 2017-02-06 ENCOUNTER — Inpatient Hospital Stay (INDEPENDENT_AMBULATORY_CARE_PROVIDER_SITE_OTHER): Payer: BC Managed Care – PPO | Admitting: Orthopaedic Surgery

## 2017-02-06 ENCOUNTER — Telehealth (INDEPENDENT_AMBULATORY_CARE_PROVIDER_SITE_OTHER): Payer: Self-pay | Admitting: Orthopaedic Surgery

## 2017-02-06 ENCOUNTER — Telehealth (INDEPENDENT_AMBULATORY_CARE_PROVIDER_SITE_OTHER): Payer: Self-pay | Admitting: Radiology

## 2017-02-06 DIAGNOSIS — Z89512 Acquired absence of left leg below knee: Secondary | ICD-10-CM

## 2017-02-06 NOTE — Telephone Encounter (Signed)
noted 

## 2017-02-06 NOTE — Telephone Encounter (Signed)
The office received paperwork today from Judd Gaudierichard Bach, [place] for [employer] which states the patient may be accompanied to the appt by Raynelle FanningJulie or Tamala JulianBen Griego but not to disclose sensitive information to them or allow them to make any decisions on Mr. Paino's behalf.  After speaking with Dr. Ophelia CharterYates, I called Mr. Toni ArthursBach to find out if he wanted for the above named to be in the room for the examination or if they just needed to wait in the waiting room.  Per Mr. Toni ArthursBach, it is okay for them to accompany him to the room and ask questions. They are not allowed to make any decisions on his behalf.

## 2017-02-06 NOTE — Telephone Encounter (Signed)
I called discussed. Pt sleeping all the time due to liver disease. Hospice seeing him. Leave staples if he is doing better can come in next week. FYI

## 2017-02-06 NOTE — Telephone Encounter (Signed)
Please see below. I received call from Hospice nurse, Toniann FailWendy, this morning. She is going out to access patient, but states as of yesterday he was declining rapidly and she did not think he was going to be able to make it to his appt. She is going to call back when she is with patient to let us know how he is and get instructions if we want her to remove staples, etc.

## 2017-02-06 NOTE — Telephone Encounter (Signed)
Raynelle FanningJulie left a voicemail on phone stating that she takes care of Mr. Argentina PonderFlurchick and that he is unable to make appt in the office. She feels that the staples need to come out, but knows that Dr. Ophelia CharterYates told the hospice nurse to leave them in until follow up in office. She would like a call back.  [phone]   Dr. Ophelia CharterYates tried to reach Raynelle FanningJulie to explain patient needs to be seen in office. There was no answer. Since then, patient was put back on the schedule for appt this afternoon.

## 2017-02-06 NOTE — Telephone Encounter (Signed)
Patient was wanting Dr. Ophelia CharterYates to contact hospice and let them know that he needs in home care to get his staples removed, he will no longer be able to make it to his appointments. CB # [phone]

## 2017-02-06 NOTE — Progress Notes (Signed)
Post-Op Visit Note   Patient: Jeffery [name]           Date of Birth: [birth year]           MRN: [id number] Visit Date: 02/06/2017 PCP: Adrian Prince, MD   Assessment & Plan:  Chief Complaint:  Chief Complaint  Patient presents with  . Left Leg - Routine Post Op   Visit Diagnoses:  1. 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 He has some serous drainage from the stump was some erythema and  Concern is  if we remove the staples today ihis stump is likely to [place]. We'll leave the staples and recheck him in 2-3 weeks. 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 Due to infection and then rapid renal and liver decline is received while where he requires a total care. He has significant ascites and got agitated and after having 1 dose of Ativan seemed to have become much less lucid. Follow-Up Instructions: Return in about 2 weeks (around 02/20/2017).   Orders:  No orders of the defined types were placed in this encounter.  No orders of the defined types were placed in this encounter.   Imaging: No results found.  PMFS History: Patient Active Problem List   Diagnosis Date Noted  . Right leg pain   . Malignant ascites   . Hyperuricemia   . Protein-calorie malnutrition, severe 01/04/2017  . Encephalopathy, hepatic (HCC)   . Pain   . Unilateral complete BKA, left, initial encounter (HCC) 12/27/2016  . Abnormality of gait   . Hypotension   . Type 2 diabetes mellitus with peripheral neuropathy (HCC)   . Abscess of left foot 12/22/2016  . Septic arthritis of left foot (HCC) 12/22/2016  . CKD (chronic kidney disease), stage III 12/18/2016  . Hyponatremia 12/18/2016  . Hyperkalemia 12/18/2016  . Diabetic ulcer of foot associated with type 2  diabetes mellitus, with necrosis of bone (HCC) 12/13/2016  . Depressed affect 12/13/2016  . Leg edema 11/24/2016  . Cirrhosis of liver (HCC)   . Multiple duodenal ulcers   . Ulcer of esophagus without bleeding   . Melena   . Chronic liver failure without hepatic coma (HCC)   . Advance care planning   . DNR (do not resuscitate) discussion   . Palliative care by specialist   . End stage liver disease (HCC)   . Moderate single current episode of major depressive disorder (HCC)   . Anxiety state   . Diaper candidiasis   . Somnolence   . Moderate malnutrition (HCC)   . Noninfectious gastroenteritis   . Gastrointestinal hemorrhage   . Diabetic foot ulcer (HCC)   . Osteomyelitis of left foot (HCC)   . Acute renal failure (HCC)   . Abdominal pain   . Diarrhea   . Osteomyelitis (HCC)   . C. difficile colitis 10/23/2016  . AKI (acute kidney injury) (HCC)   . General weakness   . Gastrointestinal hemorrhage with melena   . Idiopathic hypotension   . Other hyperlipidemia   . Esophageal varices without bleeding (HCC)   . Portal hypertensive gastropathy (HCC)   . Cirrhosis of liver with ascites (HCC) 05/12/2016  . Special screening for malignant neoplasms, colon 05/12/2016  . Coagulopathy (HCC) 04/28/2016  . Hemolytic anemia (  HCC) 03/24/2016  . G6PD deficiency anemia (HCC) 03/24/2016  . Thrombocytopenia (HCC) 03/24/2016  . Generalized psoriasis 03/20/2016  . Gilbert syndrome 03/20/2016  . Pancytopenia, acquired (HCC) 03/20/2016  . Hematemesis without nausea 03/20/2016   Past Medical History:  Diagnosis Date  . Anemia   . Blurred vision    improved now  . Childhood asthma   . Cholelithiasis   . Eczema    inner wrist  . History of blood transfusion    "all related to anemia"  . Hypertension   . Muscle cramps   . Necrotizing fasciitis (HCC)   . Neuropathy   . Rash    wrist and hands since late june  . Retinal degeneration   . Stomach ulcer   . Type II diabetes mellitus  (HCC)     Family History  Problem Relation Age of Onset  . Cancer Mother        unknown type    Past Surgical History:  Procedure Laterality Date  . ABOVE KNEE LEG AMPUTATION     right  . AMPUTATION Left 12/23/2016   Procedure: AMPUTATION BELOW KNEE;  Surgeon: Eldred MangesMark C Jamiee Milholland, MD;  [facility];  Service: Orthopedics;  Laterality: Left;  . APPLICATION OF WOUND VAC Left 11/06/2016   Procedure: APPLICATION OF WOUND VAC;  Surgeon: Eldred MangesMark C Tarus Briski, MD;  Location: [facility] OR;  Service: Orthopedics;  Laterality: Left;  . CATARACT EXTRACTION W/ INTRAOCULAR LENS  IMPLANT, BILATERAL Bilateral   . COLONOSCOPY WITH PROPOFOL N/A 05/18/2016   Procedure: COLONOSCOPY WITH PROPOFOL;  Surgeon: Rachael Feeaniel P Jacobs, MD;  Location: [facility];  Service: Endoscopy;  Laterality: N/A;  . ESOPHAGOGASTRODUODENOSCOPY (EGD) WITH PROPOFOL N/A 05/18/2016   Procedure: ESOPHAGOGASTRODUODENOSCOPY (EGD) WITH PROPOFOL;  Surgeon: Rachael Feeaniel P Jacobs, MD;  Location: [facility];  Service: Endoscopy;  Laterality: N/A;  . ESOPHAGOGASTRODUODENOSCOPY (EGD) WITH PROPOFOL N/A 11/01/2016   Procedure: ESOPHAGOGASTRODUODENOSCOPY (EGD) WITH PROPOFOL;  Surgeon: Napoleon FormKavitha Nandigam V, MD;  Location: [facility];  Service: Endoscopy;  Laterality: N/A;  . EYE SURGERY  09/2011   bilateral for retinopathy - laser eye surgery  . FOOT FRACTURE SURGERY Left 2003   5th metatarsal; "put 3 screws in it"  . I&D EXTREMITY Left 10/25/2016   Procedure: LEFT FOOT DEBRIDEMENT RAY AMPUTATION PLACEMENT OF WOUND VAC;  Surgeon: Eldred MangesMark C Dorrian Doggett, MD;  Location: [facility] OR;  Service: Orthopedics;  Laterality: Left;  . I&D EXTREMITY Left 11/06/2016   Procedure: IRRIGATION AND DEBRIDEMENT EXTREMITY/LEFT FOOT;  Surgeon: Eldred MangesMark C Ruthene Methvin, MD;  Location: [facility] OR;  Service: Orthopedics;  Laterality: Left;  . IR PARACENTESIS  12/20/2016  . IR PARACENTESIS  12/29/2016  . IR PARACENTESIS  01/03/2017  . IR PARACENTESIS  01/09/2017  . TOE AMPUTATION Left    big toe   Social History   Occupational  History  . Not on file.   Social History Main Topics  . Smoking status: Never Smoker  . Smokeless tobacco: Never Used  . Alcohol use No  . Drug use: No  . Sexual activity: Not on file     Comment: computer work. next of kin. Sister Marisue HumbleMAureen next of kin

## 2017-02-08 ENCOUNTER — Telehealth: Payer: Self-pay | Admitting: Gastroenterology

## 2017-02-08 ENCOUNTER — Ambulatory Visit (HOSPITAL_COMMUNITY): Admission: RE | Admit: 2017-02-08 | Payer: BC Managed Care – PPO | Source: Ambulatory Visit

## 2017-02-08 NOTE — Telephone Encounter (Signed)
Dr Christella HartiganJacobs the pt is scheduled for paracentesis today at 10 am.  He saw Dr Evlyn [facility]South yesterday and advised the POA that the pt is declining and has "hours to days" and did not need any further unnecessary procedures.  The POA wants to know if the paracentesis is really needed.  He states the pt is not responding and to commands.  Please advise.

## 2017-02-08 NOTE — Telephone Encounter (Signed)
That is perfectly reasonable, no further paracentesis.    Thanks

## 2017-02-08 NOTE — Telephone Encounter (Signed)
FYI:  Dr Christella HartiganJacobs the pt's POA called back and states that Dr Rinaldo CloudSouth's office will be faxing notes but that the note says in capitol letters "NO FURTHER PARACENTESIS" they cancelled the appt for today.

## 2017-02-13 ENCOUNTER — Telehealth: Payer: Self-pay | Admitting: Gastroenterology

## 2017-02-13 NOTE — Telephone Encounter (Signed)
Pt has been advised that Dr Truett PernaSherrill requested the pt not have any further paracentesis.  The pt was advised to call Dr Kalman DrapeSherrill's office for further questions.

## 2017-02-15 ENCOUNTER — Ambulatory Visit (HOSPITAL_COMMUNITY): Payer: BC Managed Care – PPO

## 2017-02-16 NOTE — Addendum Note (Signed)
Addendum  created 02/16/17 1148 by Tomiko Schoon, MD   Sign clinical note    

## 2017-02-21 ENCOUNTER — Ambulatory Visit (INDEPENDENT_AMBULATORY_CARE_PROVIDER_SITE_OTHER): Payer: BC Managed Care – PPO | Admitting: Orthopaedic Surgery

## 2017-03-18 DEATH — deceased

## 2017-03-26 ENCOUNTER — Ambulatory Visit: Payer: BC Managed Care – PPO | Admitting: Gastroenterology

## 2018-04-05 IMAGING — DX DG FOOT 2V*L*
2 series · 2 of 2 positions shown · non-contrast
Comparison: MRI 10/24/2016

CLINICAL DATA: Osteomyelitis

EXAM:
LEFT FOOT - 2 VIEW

[foot ap]
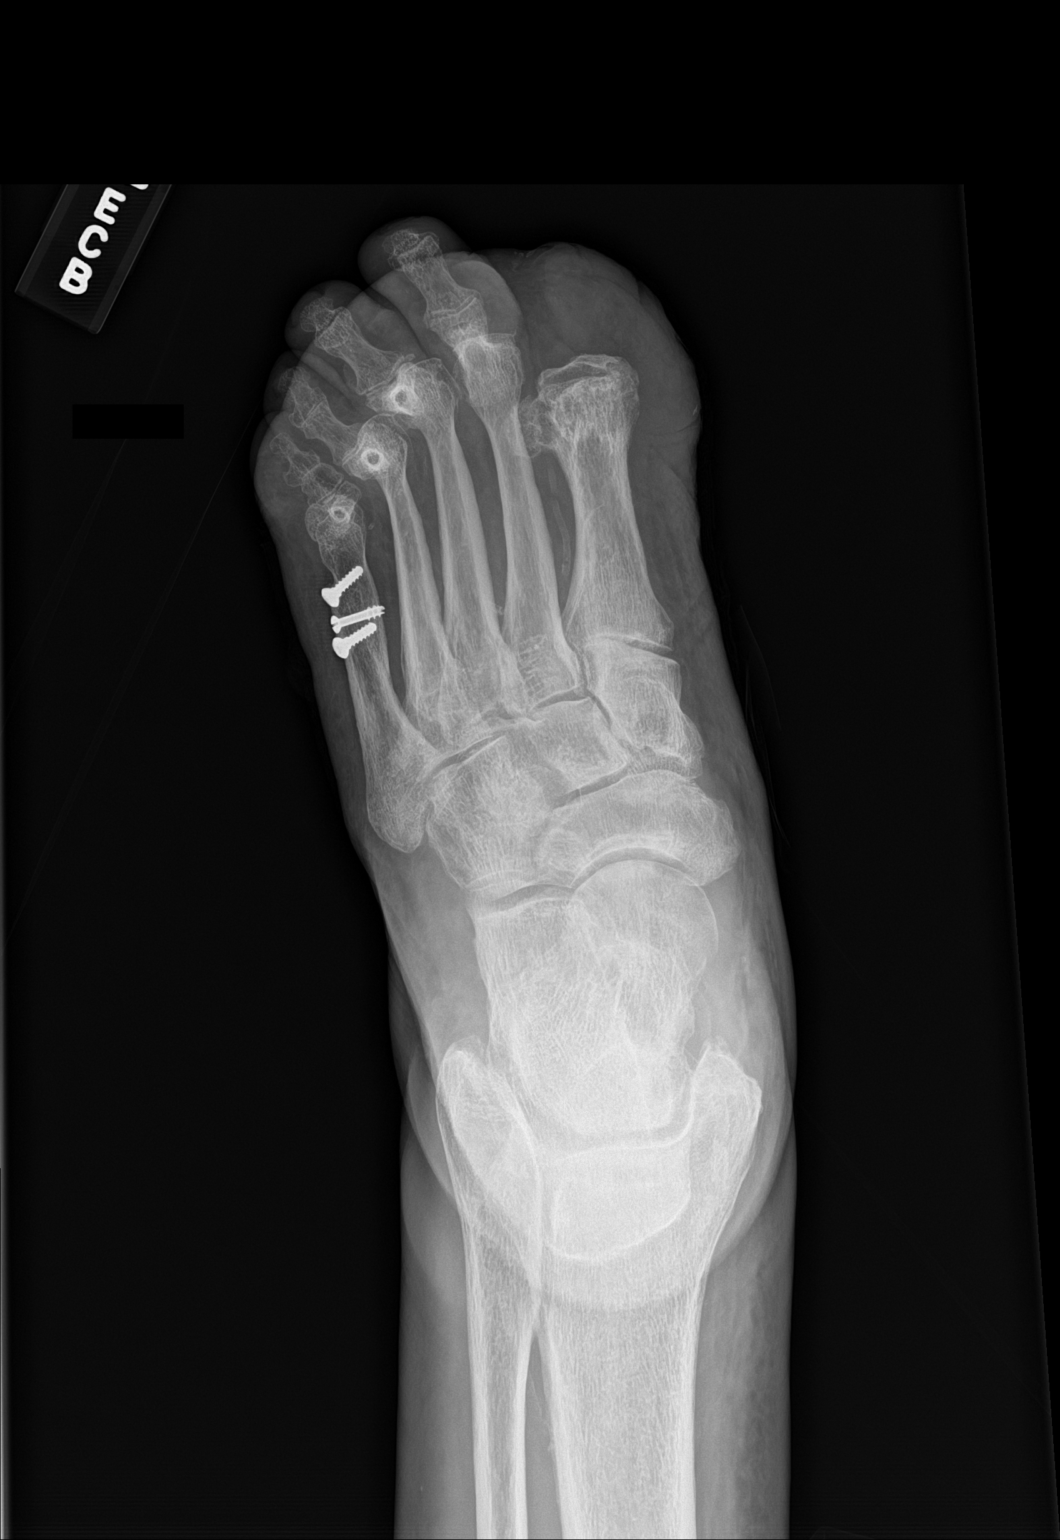

[foot lat]
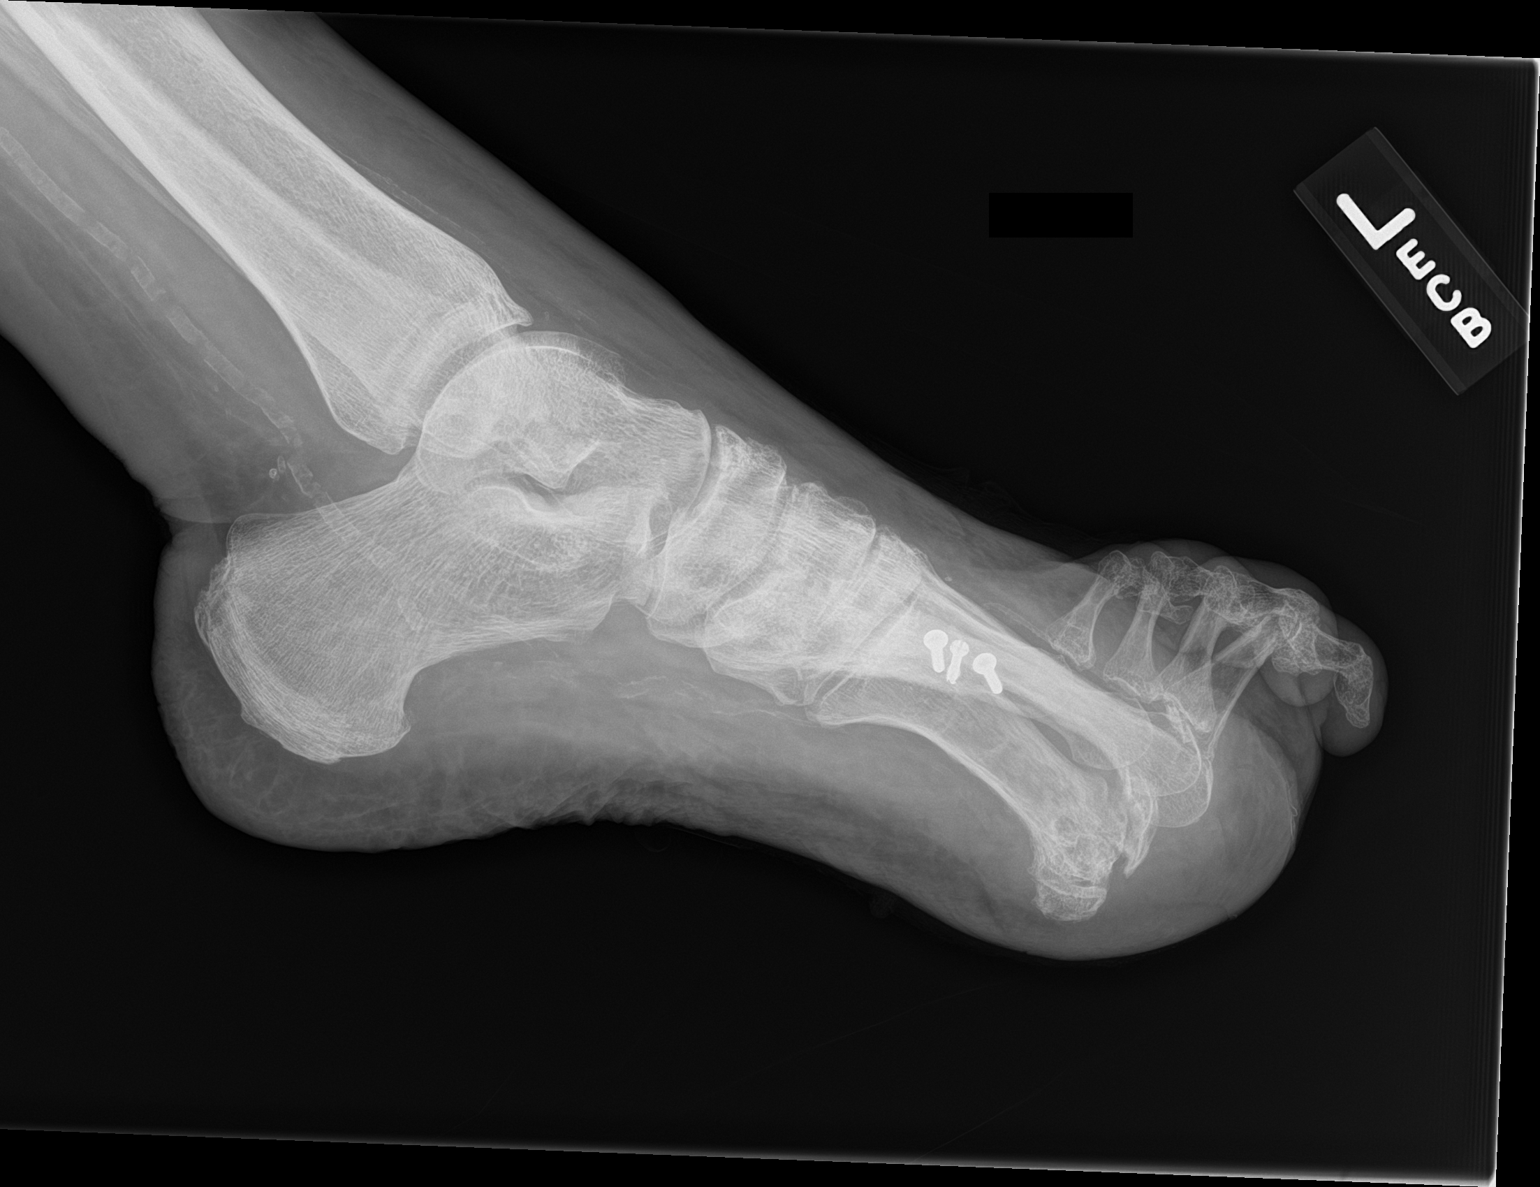

[2 of 2 positions shown; findings below may reference images not displayed]

FINDINGS: Changes of prior amputation of the left great toe.
Lucency/irregularity within the distal aspect of the left first
metatarsal may reflect area of bone destruction and osteomyelitis.
No additional acute bony abnormality. Prior internal fixation of an
old healed fifth metatarsal fracture.
IMPRESSION: Lucency in the distal left first metatarsal, possibly area of
osteomyelitis.

## 2018-04-05 IMAGING — MR MR FOOT*L* W/O CM
4 of 5 series · 19 of 40 positions shown · non-contrast
Comparison: CT left lower extremity 04/06/2010

CLINICAL DATA: Nonhealing ulcer at the ball of the foot below were
the great toe use to be. Query osteomyelitis.

EXAM:
MRI OF THE LEFT FOOT WITHOUT CONTRAST
TECHNIQUE: Multiplanar, multisequence MR imaging of the left forefoot was
performed. No intravenous contrast was administered.

[Series 4: T1 · oblique · 4.0mm · 0.29mm/px · 5 of 30 slices shown (1 of 2)]
[im 1/30]
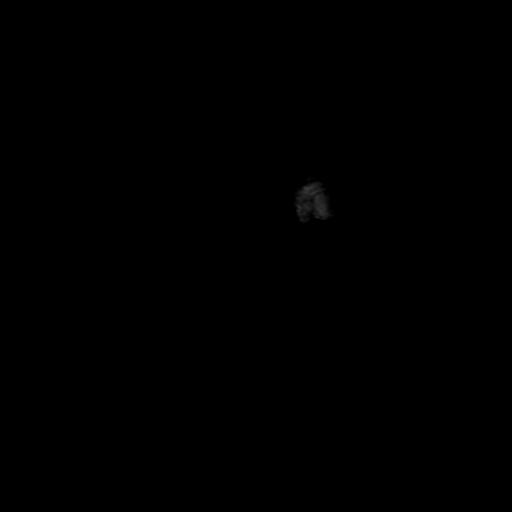
[im 4/30]
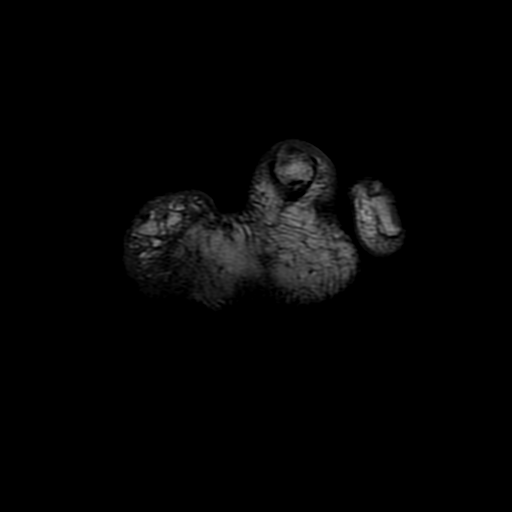
[im 10/30]
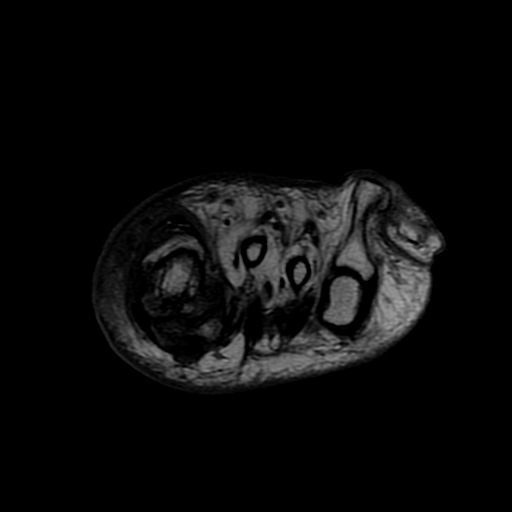
[im 17/30]
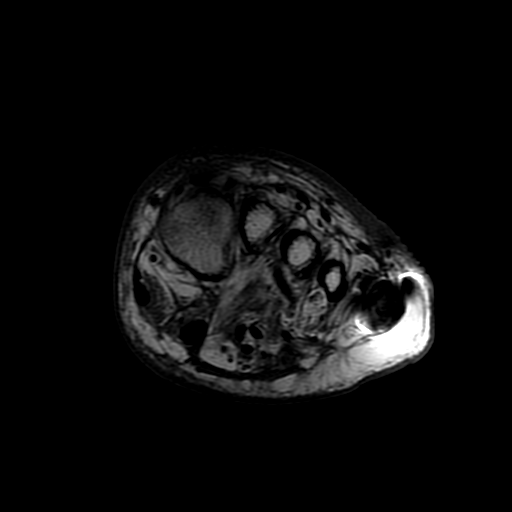
[im 26/30]
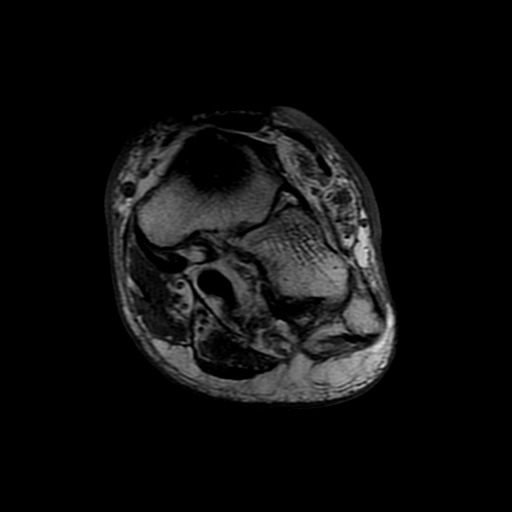

[Series 5: T2 fat-sat · oblique · 4.0mm · 0.29mm/px · 8 of 30 slices shown]
[im 1/30]
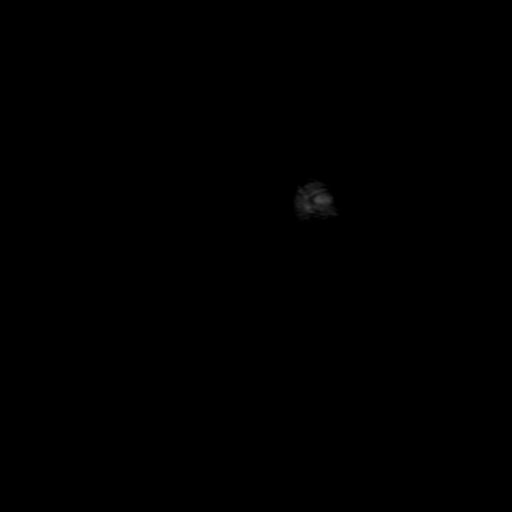
[im 4/30]
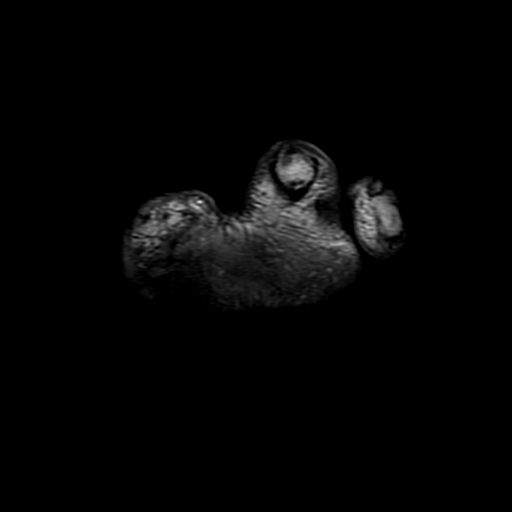
[im 10/30]
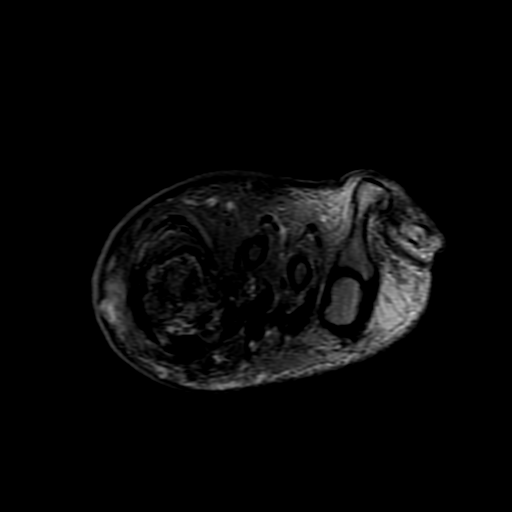
[im 13/30]
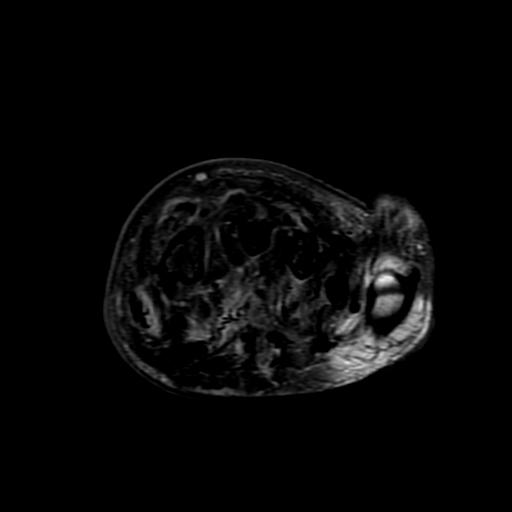
[im 17/30]
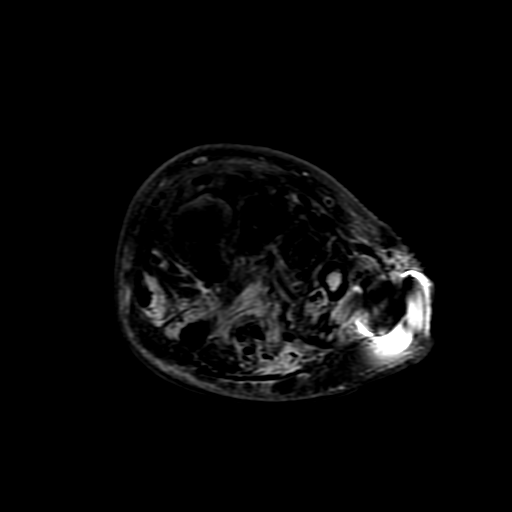
[im 20/30]
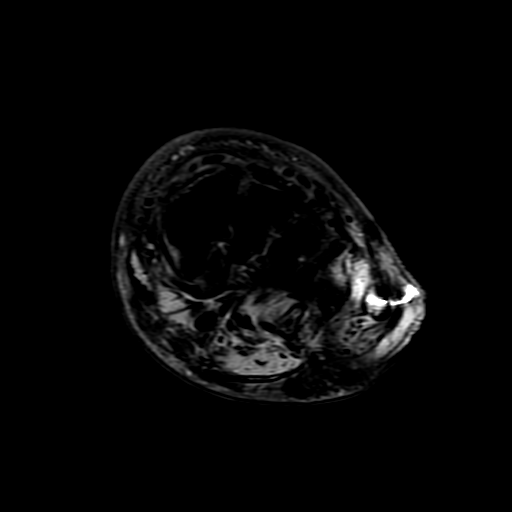
[im 26/30]
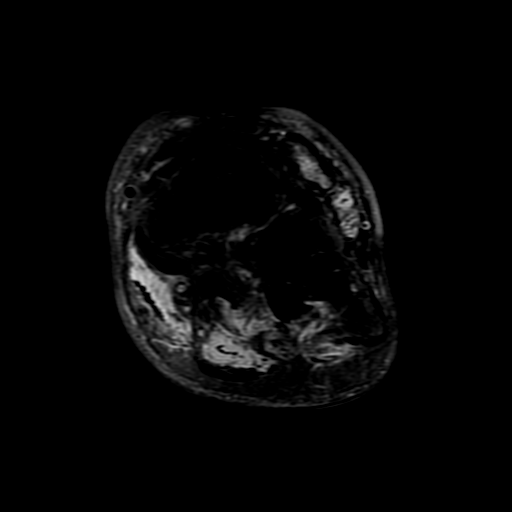
[im 30/30]
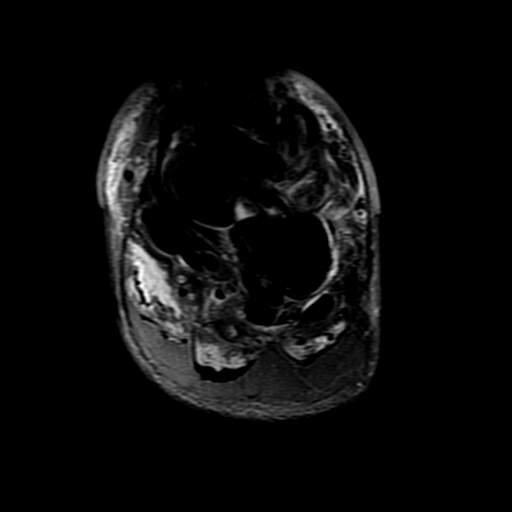

[Series 6: T1 · oblique · 4.0mm · 0.31mm/px · 3 of 20 slices shown (2 of 2)]
[im 4/20]
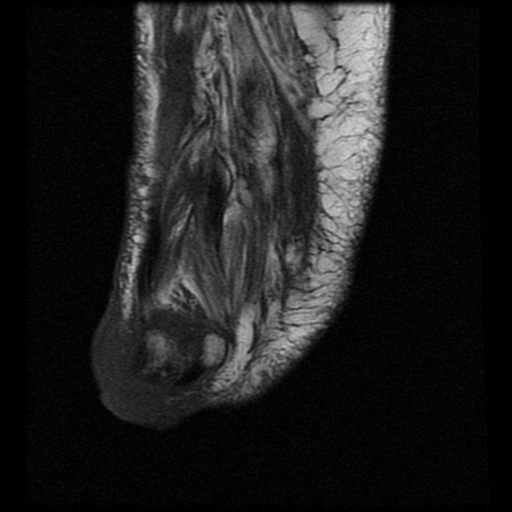
[im 10/20]
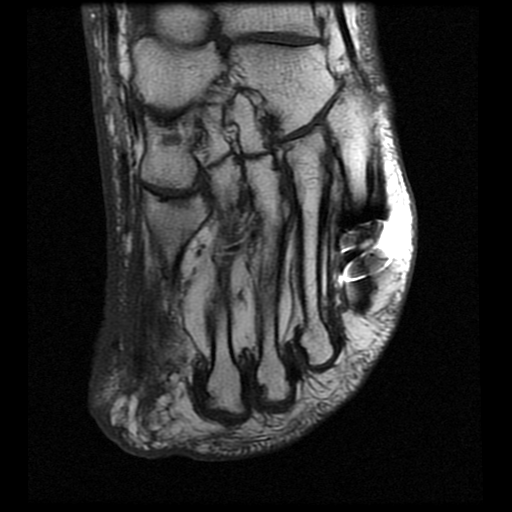
[im 16/20]
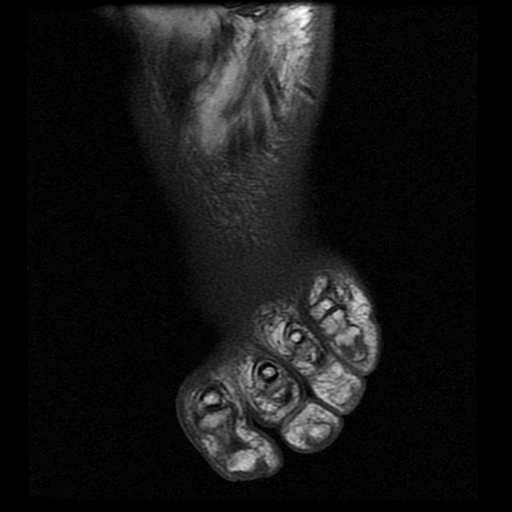

[Series 7: STIR · oblique · 4.0mm · 0.31mm/px · 3 of 20 slices shown]
[im 4/20]
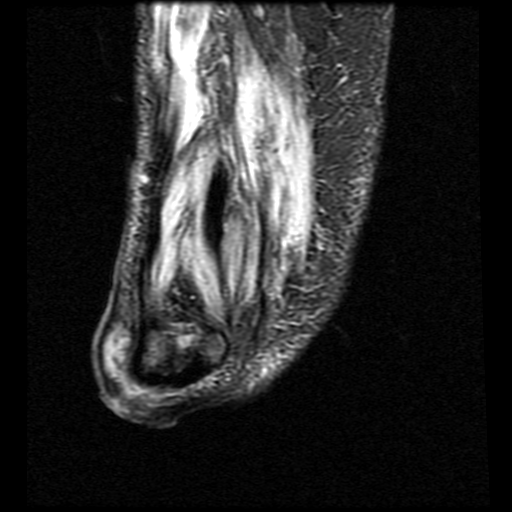
[im 10/20]
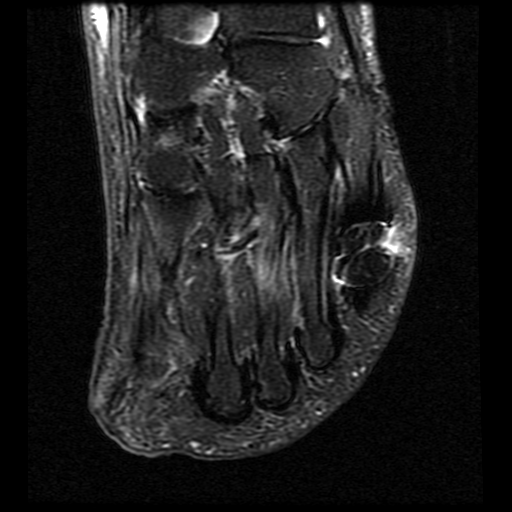
[im 16/20]
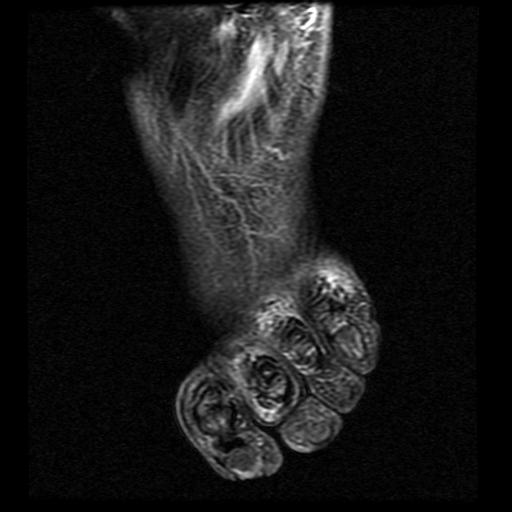

[19 of 40 positions shown; findings below may reference images not displayed]

FINDINGS: Bones/Joint/Cartilage

Previous amputation of the great toe at the base of the proximal
phalanx. Somewhat irregular appearance of the first metatarsal head
with increased marrow signal intensity in the distal first
metatarsal head as well as in the remaining proximal phalangeal
stump. Focal subperiosteal signal intensity along the metatarsal
hand. Changes suggest focal osteomyelitis. Scattered marrow signal
intensity changes in the intertarsal bones probably represent
degenerative change. Hammertoe deformities.

Muscles and Tendons

Visualized flexor and extensor tendons appear intact with normal
signal intensity. Edema in the plantar musculature of the forefoot
suggesting inflammatory change.

Soft tissues

Subcutaneous soft tissue edema around the first metatarsal head and
stump with focal ulceration inferior and lateral to the metatarsal
head suggesting cellulitis. No loculated fluid collection to suggest
abscess.
IMPRESSION: Previous amputation of the right first toe. Soft tissue edema
consistent with cellulitis and ulceration at the stomach and over
the first metatarsal head. Edema in the plantar musculature. Focal
bone erosion and marrow signal changes at the distal first
metatarsal head and involving the proximal phalangeal stump likely
indicates focal osteomyelitis.

## 2018-06-10 IMAGING — US IR PARACENTESIS
1 series · 2 of 2 positions shown · non-contrast
Comparison: none

INDICATION: Patient with recurrent ascites. Request is made for diagnostic and
therapeutic paracentesis. Patient has a 1 liter maximum.

[Series 1: ir (id) (id)/(id)/(id) ir · 2 of 2 slices shown]
[im 1/2]
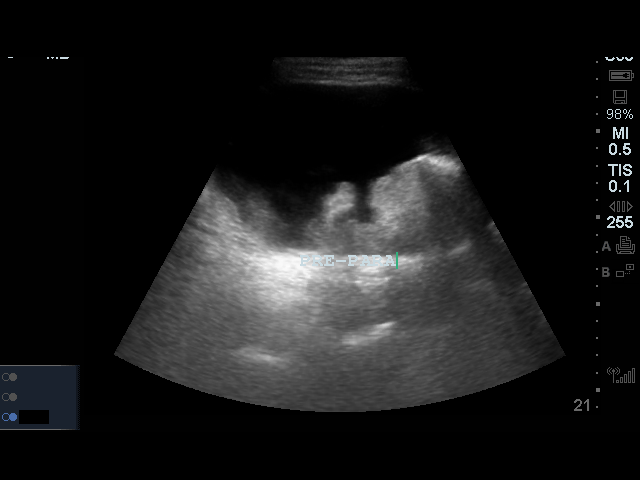
[im 2/2]
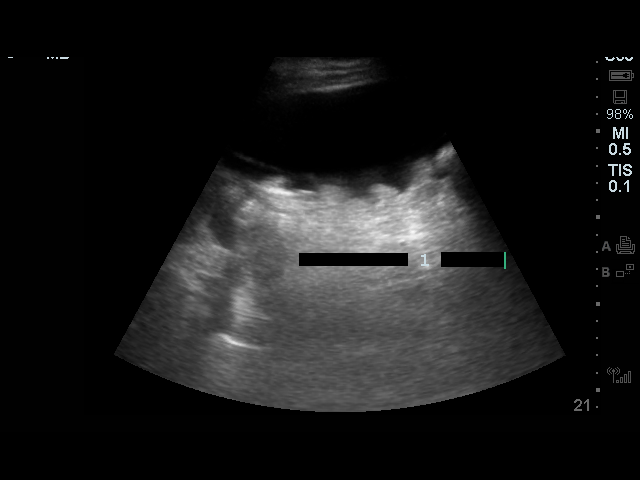

[2 of 2 positions shown; findings below may reference images not displayed]

EXAM:
ULTRASOUND GUIDED DIAGNOSTIC AND THERAPEUTIC PARACENTESIS

MEDICATIONS:
10 mL 1% lidocaine

COMPLICATIONS:
None immediate.

PROCEDURE:
Informed written consent was obtained from the patient after a
discussion of the risks, benefits and alternatives to treatment. A
timeout was performed prior to the initiation of the procedure.

Initial ultrasound scanning demonstrates a large amount of ascites
within the left lateral abdomen. The left lateral abdomen was
prepped and draped in the usual sterile fashion. 1% lidocaine was
used for local anesthesia.

Following this, a 19 gauge, 10-cm, Yueh catheter was introduced. An
ultrasound image was saved for documentation purposes. The
paracentesis was performed. The catheter was removed and a dressing
was applied. The patient tolerated the procedure well without
immediate post procedural complication.
FINDINGS: A total of approximately 1.0 liters of clear, yellow fluid was
removed. Samples were sent to the laboratory as requested by the
clinical team.
IMPRESSION: Successful ultrasound-guided paracentesis yielding 1.0 liters of
peritoneal fluid.
